# Patient Record
Sex: Female | Born: 1980 | Race: Asian | Hispanic: No | Marital: Married | Smoking: Never smoker
Health system: Southern US, Community
[De-identification: ages and names within clinical notes are randomized; demographics above are authoritative.]

## PROBLEM LIST (undated history)

## (undated) DIAGNOSIS — R7611 Nonspecific reaction to tuberculin skin test without active tuberculosis: Secondary | ICD-10-CM

## (undated) DIAGNOSIS — I10 Essential (primary) hypertension: Secondary | ICD-10-CM

## (undated) DIAGNOSIS — E119 Type 2 diabetes mellitus without complications: Secondary | ICD-10-CM

## (undated) DIAGNOSIS — I739 Peripheral vascular disease, unspecified: Secondary | ICD-10-CM

## (undated) DIAGNOSIS — C801 Malignant (primary) neoplasm, unspecified: Secondary | ICD-10-CM

## (undated) DIAGNOSIS — N809 Endometriosis, unspecified: Secondary | ICD-10-CM

## (undated) DIAGNOSIS — E785 Hyperlipidemia, unspecified: Secondary | ICD-10-CM

## (undated) DIAGNOSIS — R7302 Impaired glucose tolerance (oral): Secondary | ICD-10-CM

## (undated) DIAGNOSIS — L210 Seborrhea capitis: Secondary | ICD-10-CM

## (undated) HISTORY — DX: Endometriosis, unspecified: N80.9

## (undated) HISTORY — DX: Hyperlipidemia, unspecified: E78.5

## (undated) HISTORY — DX: Impaired glucose tolerance (oral): R73.02

---

## 2014-02-13 DIAGNOSIS — N809 Endometriosis, unspecified: Secondary | ICD-10-CM

## 2014-02-13 HISTORY — DX: Endometriosis, unspecified: N80.9

## 2014-06-22 ENCOUNTER — Ambulatory Visit (INDEPENDENT_AMBULATORY_CARE_PROVIDER_SITE_OTHER): Payer: 59 | Admitting: Family Medicine

## 2014-06-22 ENCOUNTER — Encounter: Payer: Self-pay | Admitting: Family Medicine

## 2014-06-22 VITALS — BP 117/82 | HR 72 | Temp 98.5°F | Resp 16 | Ht 62.0 in | Wt 135.2 lb

## 2014-06-22 DIAGNOSIS — L298 Other pruritus: Secondary | ICD-10-CM

## 2014-06-22 DIAGNOSIS — Z0000 Encounter for general adult medical examination without abnormal findings: Secondary | ICD-10-CM

## 2014-06-22 DIAGNOSIS — L210 Seborrhea capitis: Secondary | ICD-10-CM

## 2014-06-22 DIAGNOSIS — Z23 Encounter for immunization: Secondary | ICD-10-CM | POA: Diagnosis not present

## 2014-06-22 DIAGNOSIS — Z13220 Encounter for screening for lipoid disorders: Secondary | ICD-10-CM | POA: Diagnosis not present

## 2014-06-22 DIAGNOSIS — N979 Female infertility, unspecified: Secondary | ICD-10-CM

## 2014-06-22 DIAGNOSIS — N898 Other specified noninflammatory disorders of vagina: Secondary | ICD-10-CM

## 2014-06-22 DIAGNOSIS — L219 Seborrheic dermatitis, unspecified: Secondary | ICD-10-CM | POA: Diagnosis not present

## 2014-06-22 DIAGNOSIS — Z01419 Encounter for gynecological examination (general) (routine) without abnormal findings: Secondary | ICD-10-CM

## 2014-06-22 DIAGNOSIS — Z131 Encounter for screening for diabetes mellitus: Secondary | ICD-10-CM | POA: Diagnosis not present

## 2014-06-22 LAB — POCT URINALYSIS DIPSTICK
Bilirubin, UA: NEGATIVE
Blood, UA: NEGATIVE
GLUCOSE UA: NEGATIVE
Ketones, UA: NEGATIVE
LEUKOCYTES UA: NEGATIVE
NITRITE UA: NEGATIVE
Protein, UA: NEGATIVE
SPEC GRAV UA: 1.02
Urobilinogen, UA: 0.2
pH, UA: 5.5

## 2014-06-22 LAB — POCT URINE PREGNANCY: Preg Test, Ur: NEGATIVE

## 2014-06-22 LAB — COMPREHENSIVE METABOLIC PANEL
ALT: 40 U/L — ABNORMAL HIGH (ref 0–35)
AST: 24 U/L (ref 0–37)
Albumin: 4.4 g/dL (ref 3.5–5.2)
Alkaline Phosphatase: 95 U/L (ref 39–117)
BUN: 11 mg/dL (ref 6–23)
CO2: 26 mEq/L (ref 19–32)
CREATININE: 0.56 mg/dL (ref 0.50–1.10)
Calcium: 9.7 mg/dL (ref 8.4–10.5)
Chloride: 101 mEq/L (ref 96–112)
Glucose, Bld: 93 mg/dL (ref 70–99)
Potassium: 3.7 mEq/L (ref 3.5–5.3)
Sodium: 138 mEq/L (ref 135–145)
Total Bilirubin: 0.5 mg/dL (ref 0.2–1.2)
Total Protein: 8.3 g/dL (ref 6.0–8.3)

## 2014-06-22 LAB — LIPID PANEL
CHOL/HDL RATIO: 4.5 ratio
CHOLESTEROL: 213 mg/dL — AB (ref 0–200)
HDL: 47 mg/dL (ref >=46)
LDL Cholesterol: 147 mg/dL — ABNORMAL HIGH (ref 0–99)
Triglycerides: 97 mg/dL (ref <150)
VLDL: 19 mg/dL (ref 0–40)

## 2014-06-22 LAB — POCT WET PREP WITH KOH
KOH PREP POC: NEGATIVE
RBC WET PREP PER HPF POC: NEGATIVE
Trichomonas, UA: NEGATIVE
Yeast Wet Prep HPF POC: NEGATIVE

## 2014-06-22 MED ORDER — AMOXICILLIN 500 MG PO CAPS: 1000.0000 mg | ORAL_CAPSULE | Freq: Two times a day (BID) | ORAL | Status: DC

## 2014-06-22 NOTE — Progress Notes (Signed)
   Subjective:    Patient ID: Marie Hill, female    DOB: [birth year], 34 y.o.   MRN: [id number]  06/22/2014  Establish Care   HPI This 34 y.o. female presents to establish care and for the following:  Last physical:  Unsure. More than ten years. Pap smear:  never Mammogram:  never Colonoscopy:  never TDAP:  20 years ago. Influenza: never Eye exam:  2015; +glasses Dental exam:  [place]   Infertility:  Has been actively trying for 5-6 months.  Regular menses (7 days, heavy and then light, cramping).  Menses every 21 days.  Recently having more cramping.  Changed in past 3 months.  Withdrawal method prior to six months ago.  Two total partners in life.    L ear pain: onset 3 months ago.  Ear pain some; liquid coming from ear.  No pain when laying on L ear.    Skin issues:  Scalp irritation.  Using Dandruff shampoo and also using eczema cream.  +itching.  Fungal infection?     Review of Systems  History reviewed. No pertinent past medical history. History reviewed. No pertinent past surgical history. Not on File No current outpatient prescriptions on file.   No current facility-administered medications for this visit.       Objective:    BP 117/82 mmHg  Pulse 72  Temp(Src) 98.5 F (36.9 C) (Oral)  Resp 16  Ht 5\' 2"  (1.575 m)  Wt 135 lb 3.2 oz (61.326 kg)  BMI 24.72 kg/m2  SpO2 100%  LMP 06/03/2014 Physical Exam Results for orders placed or performed in visit on 06/22/14  POCT urinalysis dipstick  Result Value Ref Range   Color, UA yellow    Clarity, UA clear    Glucose, UA neg    Bilirubin, UA neg    Ketones, UA neg    Spec Grav, UA 1.020    Blood, UA neg    pH, UA 5.5    Protein, UA neg    Urobilinogen, UA 0.2    Nitrite, UA neg    Leukocytes, UA Negative   POCT urine pregnancy  Result Value Ref Range   Preg Test, Ur Negative   POCT Wet Prep with KOH  Result Value Ref Range   Trichomonas, UA Negative    Clue Cells Wet Prep HPF POC 0-1    Epithelial Wet  Prep HPF POC 2-4    Yeast Wet Prep HPF POC neg    Bacteria Wet Prep HPF POC 2+    RBC Wet Prep HPF POC neg    WBC Wet Prep HPF POC 2-4    KOH Prep POC Negative        Assessment & Plan:   1. Encounter for routine gynecological examination   2. Vaginal itching   3. Need for Tdap vaccination   4. Infertility, female   80. Screening for diabetes mellitus   6. Screening, lipid     No orders of the defined types were placed in this encounter.    No Follow-up on file.     George Haggart Elayne Guerin, M.D. [facility] [address] [place], [place]  [address] [phone] phone [phone] fax

## 2014-06-22 NOTE — Patient Instructions (Signed)
1.  RECOMMEND YOU STARTING PRENATAL VITAMIN; ONE TABLET AT BEDTIME.

## 2014-06-23 LAB — CBC WITH DIFFERENTIAL/PLATELET
Basophils Absolute: 0 10*3/uL (ref 0.0–0.1)
Basophils Relative: 0 % (ref 0–1)
Eosinophils Absolute: 0.2 10*3/uL (ref 0.0–0.7)
Eosinophils Relative: 2 % (ref 0–5)
HCT: 41.6 % (ref 36.0–46.0)
Hemoglobin: 13.4 g/dL (ref 12.0–15.0)
Lymphocytes Relative: 30 % (ref 12–46)
Lymphs Abs: 2.6 10*3/uL (ref 0.7–4.0)
MCH: 26.3 pg (ref 26.0–34.0)
MCHC: 32.2 g/dL (ref 30.0–36.0)
MCV: 81.7 fL (ref 78.0–100.0)
MONO ABS: 0.7 10*3/uL (ref 0.1–1.0)
MONOS PCT: 8 % (ref 3–12)
MPV: 10.7 fL (ref 8.6–12.4)
NEUTROS PCT: 60 % (ref 43–77)
Neutro Abs: 5.2 10*3/uL (ref 1.7–7.7)
Platelets: 324 10*3/uL (ref 150–400)
RBC: 5.09 MIL/uL (ref 3.87–5.11)
RDW: 14.2 % (ref 11.5–15.5)
WBC: 8.6 10*3/uL (ref 4.0–10.5)

## 2014-06-23 LAB — HEMOGLOBIN A1C
Hgb A1c MFr Bld: 6.1 % — ABNORMAL HIGH (ref <5.7)
Mean Plasma Glucose: 128 mg/dL — ABNORMAL HIGH (ref <117)

## 2014-06-23 LAB — TSH: TSH: 1.463 u[IU]/mL (ref 0.350–4.500)

## 2014-06-25 LAB — PAP IG, CT-NG NAA, HPV HIGH-RISK
CHLAMYDIA PROBE AMP: NEGATIVE
GC Probe Amp: NEGATIVE
HPV DNA HIGH RISK: NOT DETECTED

## 2014-07-02 ENCOUNTER — Encounter: Payer: Self-pay | Admitting: Family Medicine

## 2014-07-15 ENCOUNTER — Ambulatory Visit (INDEPENDENT_AMBULATORY_CARE_PROVIDER_SITE_OTHER): Payer: 59 | Admitting: Family Medicine

## 2014-07-15 ENCOUNTER — Encounter: Payer: Self-pay | Admitting: Family Medicine

## 2014-07-15 VITALS — BP 110/73 | HR 65 | Temp 98.7°F | Resp 16 | Ht 62.0 in | Wt 134.0 lb

## 2014-07-15 DIAGNOSIS — H66005 Acute suppurative otitis media without spontaneous rupture of ear drum, recurrent, left ear: Secondary | ICD-10-CM | POA: Diagnosis not present

## 2014-07-15 DIAGNOSIS — R945 Abnormal results of liver function studies: Secondary | ICD-10-CM

## 2014-07-15 DIAGNOSIS — R7302 Impaired glucose tolerance (oral): Secondary | ICD-10-CM

## 2014-07-15 DIAGNOSIS — E785 Hyperlipidemia, unspecified: Secondary | ICD-10-CM | POA: Diagnosis not present

## 2014-07-15 DIAGNOSIS — R7989 Other specified abnormal findings of blood chemistry: Secondary | ICD-10-CM

## 2014-07-15 MED ORDER — AMOXICILLIN-POT CLAVULANATE 875-125 MG PO TABS: 1.0000 | ORAL_TABLET | Freq: Two times a day (BID) | ORAL | Status: DC

## 2014-07-15 NOTE — Patient Instructions (Signed)

## 2014-07-15 NOTE — Progress Notes (Signed)
 Subjective:    Patient ID: Marie Hill, female    DOB: 10/29/1980, 33 y.o.   MRN: [id number]  07/15/2014  Follow-up; Otitis Media; Hyperlipidemia; and Hyperglycemia   HPI This 33 y.o. female presents for one month follow-up:  1. L otitis media: management at last visit included Amoxicillin therapy.  Ear pain is much improved but patient does not feel that it is 100%. Had been suffering with ear discomfort for 5-6 months prior to evaluation last month.  No rhinorrhea, nasal congestion, PND, cough. No sneezing or itchy eyes. No fever/chills/sweats. No sinus pressure.  Does not feel well on abx therapy but agreeable for further treatment if necessary.   2.  Hyperlipidemia:  Cholesterol elevated at recent visit; recommend weight loss, exercise, and low-cholesterol food choices.  Not exercising regularly.  Repeat labs recommended in six months. Lab Results  Component Value Date   CHOL 213* 06/22/2014   HDL 47 06/22/2014   LDLCALC 147* 06/22/2014   TRIG 97 06/22/2014   CHOLHDL 4.5 06/22/2014   3.  Glucose intolerance: diagnosed at visit one month ago. Denies regular sodas, sweet tea, or fruit juice.  Eats a lot of fruit in excess.  Repeat labs recommended in six months.  Lab Results  Component Value Date   HGBA1C 6.1* 06/22/2014   4. Elevated LFT: single elevated LFT at last visit; advised to repeat at visit in six months.    Review of Systems  Constitutional: Negative for fever, chills, diaphoresis and fatigue.  HENT: Positive for ear pain. Negative for congestion, ear discharge, facial swelling, hearing loss, mouth sores, nosebleeds, postnasal drip, rhinorrhea, sinus pressure, sneezing, sore throat, trouble swallowing and voice change.   Eyes: Negative for visual disturbance.  Respiratory: Negative for cough and shortness of breath.   Cardiovascular: Negative for chest pain, palpitations and leg swelling.  Gastrointestinal: Negative for nausea, vomiting, abdominal pain, diarrhea and  constipation.  Endocrine: Negative for cold intolerance, heat intolerance, polydipsia, polyphagia and polyuria.  Skin: Negative for rash.  Neurological: Negative for dizziness, tremors, seizures, syncope, facial asymmetry, speech difficulty, weakness, light-headedness, numbness and headaches.    No past medical history on file. No past surgical history on file. No Known Allergies History   Social History  . Marital Status: Single    Spouse Name: N/A  . Number of Children: N/A  . Years of Education: N/A   Occupational History  . Not on file.   Social History Main Topics  . Smoking status: Never Smoker   . Smokeless tobacco: Not on file  . Alcohol Use: 0.0 oz/week    0 Standard drinks or equivalent per week     Comment: rarely a beer  . Drug Use: No  . Sexual Activity: Yes   Other Topics Concern  . Not on file   Social History Narrative   Marital status: engaged; together with fiance x 6 years.      Children: none      Lives: with fiance; from [place]; [place] 14.      Employment:  Financial consultant and nail technician.      Tobacco: none      Alcohol: weekends      Drugs: none      Exercise:  sporadic   Family History  Problem Relation Age of Onset  . Hyperlipidemia Mother   . Hyperlipidemia Father         Objective:    BP 110/73 mmHg  Pulse 65  Temp(Src) 98.7   F (37.1 C)  Resp 16  Ht 5' 2" (1.575 m)  Wt 134 lb (60.782 kg)  BMI 24.50 kg/m2  SpO2 99%  LMP 05/13/2014 Physical Exam  Constitutional: She is oriented to person, place, and time. She appears well-developed and well-nourished. No distress.  HENT:  Head: Normocephalic and atraumatic.  Right Ear: Tympanic membrane and external ear normal.  Left Ear: External ear normal. No drainage or swelling. No mastoid tenderness. A middle ear effusion is present.  Ears:  Nose: Nose normal.  Mouth/Throat: Oropharynx is clear and moist.  Scaling B external ear canals.  Mild erythema  diffusely without swelling.  Eyes: Conjunctivae and EOM are normal. Pupils are equal, round, and reactive to light.  Neck: Normal range of motion. Neck supple. Carotid bruit is not present. No thyromegaly present.  Cardiovascular: Normal rate, regular rhythm, normal heart sounds and intact distal pulses.  Exam reveals no gallop and no friction rub.   No murmur heard. Pulmonary/Chest: Effort normal and breath sounds normal. She has no wheezes. She has no rales.  Abdominal: Soft. Bowel sounds are normal. She exhibits no distension and no mass. There is no tenderness. There is no rebound and no guarding.  Lymphadenopathy:    She has no cervical adenopathy.  Neurological: She is alert and oriented to person, place, and time. No cranial nerve deficit.  Skin: Skin is warm and dry. No rash noted. She is not diaphoretic. No erythema. No pallor.  Psychiatric: She has a normal mood and affect. Her behavior is normal.   Results for orders placed or performed in visit on 06/22/14  CBC with Differential/Platelet  Result Value Ref Range   WBC 8.6 4.0 - 10.5 K/uL   RBC 5.09 3.87 - 5.11 MIL/uL   Hemoglobin 13.4 12.0 - 15.0 g/dL   HCT 41.6 36.0 - 46.0 %   MCV 81.7 78.0 - 100.0 fL   MCH 26.3 26.0 - 34.0 pg   MCHC 32.2 30.0 - 36.0 g/dL   RDW 14.2 11.5 - 15.5 %   Platelets 324 150 - 400 K/uL   MPV 10.7 8.6 - 12.4 fL   Neutrophils Relative % 60 43 - 77 %   Neutro Abs 5.2 1.7 - 7.7 K/uL   Lymphocytes Relative 30 12 - 46 %   Lymphs Abs 2.6 0.7 - 4.0 K/uL   Monocytes Relative 8 3 - 12 %   Monocytes Absolute 0.7 0.1 - 1.0 K/uL   Eosinophils Relative 2 0 - 5 %   Eosinophils Absolute 0.2 0.0 - 0.7 K/uL   Basophils Relative 0 0 - 1 %   Basophils Absolute 0.0 0.0 - 0.1 K/uL   Smear Review Criteria for review not met   Hemoglobin A1c  Result Value Ref Range   Hgb A1c MFr Bld 6.1 (H) <5.7 %   Mean Plasma Glucose 128 (H) <117 mg/dL  Comprehensive metabolic panel  Result Value Ref Range   Sodium 138 135 -  145 mEq/L   Potassium 3.7 3.5 - 5.3 mEq/L   Chloride 101 96 - 112 mEq/L   CO2 26 19 - 32 mEq/L   Glucose, Bld 93 70 - 99 mg/dL   BUN 11 6 - 23 mg/dL   Creat 0.56 0.50 - 1.10 mg/dL   Total Bilirubin 0.5 0.2 - 1.2 mg/dL   Alkaline Phosphatase 95 39 - 117 U/L   AST 24 0 - 37 U/L   ALT 40 (H) 0 - 35 U/L   Total Protein 8.3 6.0 -  8.3 g/dL   Albumin 4.4 3.5 - 5.2 g/dL   Calcium 9.7 8.4 - 10.5 mg/dL  Lipid panel  Result Value Ref Range   Cholesterol 213 (H) 0 - 200 mg/dL   Triglycerides 97 <150 mg/dL   HDL 47 >=46 mg/dL   Total CHOL/HDL Ratio 4.5 Ratio   VLDL 19 0 - 40 mg/dL   LDL Cholesterol 147 (H) 0 - 99 mg/dL  TSH  Result Value Ref Range   TSH 1.463 0.350 - 4.500 uIU/mL  POCT urinalysis dipstick  Result Value Ref Range   Color, UA yellow    Clarity, UA clear    Glucose, UA neg    Bilirubin, UA neg    Ketones, UA neg    Spec Grav, UA 1.020    Blood, UA neg    pH, UA 5.5    Protein, UA neg    Urobilinogen, UA 0.2    Nitrite, UA neg    Leukocytes, UA Negative   POCT urine pregnancy  Result Value Ref Range   Preg Test, Ur Negative   POCT Wet Prep with KOH  Result Value Ref Range   Trichomonas, UA Negative    Clue Cells Wet Prep HPF POC 0-1    Epithelial Wet Prep HPF POC 2-4    Yeast Wet Prep HPF POC neg    Bacteria Wet Prep HPF POC 2+    RBC Wet Prep HPF POC neg    WBC Wet Prep HPF POC 2-4    KOH Prep POC Negative   Pap IG, CT/NG NAA, and HPV (high risk)  Result Value Ref Range   HPV DNA High Risk Not Detected    Specimen adequacy: SEE NOTE    FINAL DIAGNOSIS: SEE NOTE    Cytotechnologist: SEE NOTE    Chlamydia Probe Amp NEGATIVE    GC Probe Amp NEGATIVE        Assessment & Plan:   1. Recurrent acute suppurative otitis media without spontaneous rupture of left tympanic membrane   2. Hyperlipidemia   3. Glucose intolerance (impaired glucose tolerance)   4. Elevated liver function tests    1. Persistent suppurative otitis media L: improved with  persistence; rx for Augmentin provided; RTC 3 weeks to confirm resolution. 2.  Hyperlipidemia: New. Recommend weight loss, exercise, low-cholesterol food choices.  Repeat FLP in six months. 3.  Glucose intolerance: New. Recommend weight loss, exercise, low-sugar food choices.  Repeat labs in six months. 4.  Elevated LFTs: New.  Repeat at next visit; avoid alcohol and acetaminophen products.   Meds ordered this encounter  Medications  . amoxicillin-clavulanate (AUGMENTIN) 875-125 MG per tablet    Sig: Take 1 tablet by mouth 2 (two) times daily.    Dispense:  30 tablet    Refill:  0    Return in about 4 weeks (around 08/12/2014) for recheck.     Terrell Ostrand Elayne Guerin, M.D. [facility] [address] [place], [place]  [address] [phone] phone [phone] fax

## 2014-08-05 ENCOUNTER — Encounter: Payer: Self-pay | Admitting: Family Medicine

## 2014-08-05 ENCOUNTER — Ambulatory Visit (INDEPENDENT_AMBULATORY_CARE_PROVIDER_SITE_OTHER): Payer: 59 | Admitting: Family Medicine

## 2014-08-05 VITALS — BP 110/70 | HR 83 | Temp 98.2°F | Resp 16 | Ht 61.5 in | Wt 133.2 lb

## 2014-08-05 DIAGNOSIS — L298 Other pruritus: Secondary | ICD-10-CM | POA: Diagnosis not present

## 2014-08-05 DIAGNOSIS — N898 Other specified noninflammatory disorders of vagina: Secondary | ICD-10-CM

## 2014-08-05 DIAGNOSIS — H663X2 Other chronic suppurative otitis media, left ear: Secondary | ICD-10-CM

## 2014-08-05 DIAGNOSIS — R7611 Nonspecific reaction to tuberculin skin test without active tuberculosis: Secondary | ICD-10-CM

## 2014-08-05 LAB — POCT WET PREP WITH KOH
KOH Prep POC: POSITIVE
Trichomonas, UA: NEGATIVE
Yeast Wet Prep HPF POC: NEGATIVE

## 2014-08-05 MED ORDER — TERCONAZOLE 0.4 % VA CREA: 1.0000 | TOPICAL_CREAM | Freq: Every day | VAGINAL | Status: DC

## 2014-08-05 MED ORDER — FLUCONAZOLE 150 MG PO TABS: 150.0000 mg | ORAL_TABLET | Freq: Once | ORAL | Status: DC

## 2014-08-05 NOTE — Progress Notes (Signed)
Subjective:    Patient ID: Marie Hill, female    DOB: Aug 06, 1980, 34 y.o.   MRN: [id number]  08/05/2014  Follow-up   HPI This 34 y.o. female presents for follow-up:  1.  L otitis media:  Has one remaining Augmentin to take; has some drainage still from ear.    2.  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  Goes to hospital and visits friend who was diagnosed with active tuberculosis.  Testing 36mm; previous Tb test in college.  Prescribed medication for +tb test; recalls only taking one month of medication.  Here in [employer].  S/p CXR on 07/03/14; s/p [facility].  Has not been contacted about CXR results; called yesterday for results.  No blood drawn.    3. Vaginal itching:  +mild discharge; onset with abx.  Review of Systems  Constitutional: Negative for fever, chills, diaphoresis and fatigue.  Eyes: Negative for visual disturbance.  Respiratory: Negative for cough and shortness of breath.   Cardiovascular: Negative for chest pain, palpitations and leg swelling.  Gastrointestinal: Negative for nausea, vomiting, abdominal pain, diarrhea and constipation.  Endocrine: Negative for cold intolerance, heat intolerance, polydipsia, polyphagia and polyuria.  Neurological: Negative for dizziness, tremors, seizures, syncope, facial asymmetry, speech difficulty, weakness, light-headedness, numbness and headaches.    History reviewed. No pertinent past medical history. History reviewed. No pertinent past surgical history. No Known Allergies Current Outpatient Prescriptions  Medication Sig Dispense Refill  . amoxicillin-clavulanate (AUGMENTIN) 875-125 MG per tablet Take 1 tablet by mouth 2 (two) times daily. (Patient not taking: Reported on 08/12/2014) 30 tablet 0  . fluconazole (DIFLUCAN) 150 MG tablet Take 1 tablet (150 mg total) by mouth once. Repeat if needed (Patient not taking: Reported on 08/12/2014) 2 tablet 0  . terconazole (TERAZOL 7) 0.4 % vaginal cream Place  1 applicator vaginally at bedtime. (Patient not taking: Reported on 08/12/2014) 45 g 0   No current facility-administered medications for this visit.       Objective:    BP 110/70 mmHg  Pulse 83  Temp(Src) 98.2 F (36.8 C) (Oral)  Resp 16  Ht 5' 1.5" (1.562 m)  Wt 133 lb 3.2 oz (60.419 kg)  BMI 24.76 kg/m2  SpO2 99%  LMP 07/15/2014 (Approximate) Physical Exam  Constitutional: She is oriented to person, place, and time. She appears well-developed and well-nourished. No distress.  HENT:  Head: Normocephalic and atraumatic.  Right Ear: External ear normal.  Left Ear: External ear normal.  Nose: Nose normal.  Mouth/Throat: Oropharynx is clear and moist.  Eyes: Conjunctivae and EOM are normal. Pupils are equal, round, and reactive to light.  Neck: Normal range of motion. Neck supple. Carotid bruit is not present. No thyromegaly present.  Cardiovascular: Normal rate, regular rhythm, normal heart sounds and intact distal pulses.  Exam reveals no gallop and no friction rub.   No murmur heard. Pulmonary/Chest: Effort normal and breath sounds normal. She has no wheezes. She has no rales.  Abdominal: Soft. Bowel sounds are normal. She exhibits no distension and no mass. There is no tenderness. There is no rebound and no guarding.  Lymphadenopathy:    She has no cervical adenopathy.  Neurological: She is alert and oriented to person, place, and time. No cranial nerve deficit.  Skin: Skin is warm and dry. No rash noted. She is not diaphoretic. No erythema. No pallor.  Psychiatric: She has a normal mood and affect. Her behavior is normal.   Results for orders  placed or performed in visit on 08/05/14  POCT Wet Prep with KOH  Result Value Ref Range   Trichomonas, UA Negative    Clue Cells Wet Prep HPF POC tntc    Epithelial Wet Prep HPF POC Many Few, Moderate, Many   Yeast Wet Prep HPF POC neg    Bacteria Wet Prep HPF POC Moderate (A) Few   RBC Wet Prep HPF POC 3-5    WBC Wet Prep HPF  POC tntc    KOH Prep POC Positive        Assessment & Plan:   1. 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 fluconazole (DIFLUCAN) 150 MG tablet    Sig: Take 1 tablet (150 mg total) by mouth once. Repeat if needed    Dispense:  2 tablet    Refill:  0  . terconazole (TERAZOL 7) 0.4 % vaginal cream    Sig: Place 1 applicator vaginally at bedtime.    Dispense:  45 g    Refill:  0    No Follow-up on file.    Rhonda Linan Elayne Guerin, M.D. [facility] [address] [place], [place]  [address] [phone] phone [phone] fax

## 2014-08-05 NOTE — Patient Instructions (Signed)

## 2014-08-12 ENCOUNTER — Ambulatory Visit (INDEPENDENT_AMBULATORY_CARE_PROVIDER_SITE_OTHER): Payer: 59 | Admitting: Gynecology

## 2014-08-12 ENCOUNTER — Encounter: Payer: Self-pay | Admitting: Gynecology

## 2014-08-12 VITALS — BP 118/76 | Ht 61.0 in | Wt 132.0 lb

## 2014-08-12 DIAGNOSIS — R1900 Intra-abdominal and pelvic swelling, mass and lump, unspecified site: Secondary | ICD-10-CM

## 2014-08-12 DIAGNOSIS — N979 Female infertility, unspecified: Secondary | ICD-10-CM

## 2014-08-12 MED ORDER — DOXYCYCLINE HYCLATE 100 MG PO CAPS: 100.0000 mg | ORAL_CAPSULE | Freq: Two times a day (BID) | ORAL | Status: DC

## 2014-08-12 NOTE — Progress Notes (Signed)
   Patient is a 34 year old gravida 0 who presented to the office today with concerns of primary infertility. She states that she has been with her same partner for 6 years but for the past 4 years she has been using only withdrawal for contraception area for the past year she's been trying to get pregnant has been unsuccessful. She reports her cycles being every 28 days. She denies any past history of any STDs or any pelvic surgery. Her partner has had a child with a previous relationship. 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 Goes to hospital and visits friend who was diagnosed with active tuberculosis. Testing 59mm; previous Tb test in college. Prescribed medication for +tb test; recalls only taking one month of medication. Here in [employer]. S/p CXR on 07/03/14; s/p [facility]. Has not been contacted about CXR results; called yesterday for results. No blood drawn."  Patient had a normal Pap smear this year. Patient with no past history of abnormal Pap smear.  Exam: Blood pressure 118/76   weight 132 pounds   5 feet 1 inches tall   BMI 24.94 Gen. appearance well-developed 1 nursery no no acute distress Abdomen: Soft nontender no rebound or guarding Pelvic: Bartholin urethra Skene was within normal limits Vagina: No lesion discharge Cervix: No gross lesions on inspection Uterus retroverted irregular shaped enlarged nontender Adnexa difficult to assess due to size of uterus Rectal exam: Not done  Assessment/plan: #1 primary infertility enlarged uterus we'll schedule ultrasound  ultrasound before proceeding with HSG and semen analysis. #2 patient started to begin taking prenatal vitamins for neural tube defect prophylaxis #3 patient to contact the [facility] for follow-up on her TB testing or with her PCP.

## 2014-08-12 NOTE — Patient Instructions (Addendum)
Infertility WHAT IS INFERTILITY?  Infertility is usually defined as not being able to get pregnant after trying for one year of regular sexual intercourse without the use of contraceptives. Or not being able to carry a pregnancy to term and have a baby. The infertility rate in the [place] is around 10%. Pregnancy is the result of a chain of events. A woman must release an egg from one of her ovaries (ovulation). The egg must be fertilized by the female sperm. Then it travels through a fallopian tube into the uterus (womb), where it attaches to the wall of the uterus and grows. A man must have enough sperm, and the sperm must join with (fertilize) the egg along the way, at the proper time. The fertilized egg must then become attached to the inside of the uterus. While this may seem simple, many things can happen to prevent pregnancy from occurring.  WHOSE PROBLEM IS IT?  About 20% of infertility cases are due to problems with the man (female factors) and 65% are due to problems with the woman (female factors). Other cases are due to a combination of female and female factors or to unknown causes.  WHAT CAUSES INFERTILITY IN MEN?  Infertility in men is often caused by problems with making enough normal sperm or getting the sperm to reach the egg. Problems with sperm may exist from birth or develop later in life, due to illness or injury. Some men produce no sperm, or produce too few sperm (oligospermia). Other problems include:  Sexual dysfunction.  Hormonal or endocrine problems.  Age. Female fertility decreases with age, but not at as young an age as female fertility.  Infection.  Congenital problems. Birth defect, such as absence of the tubes that carry the sperm (vas deferens).  Genetic/chromosomal problems.  Antisperm antibody problems.  Retrograde ejaculation (sperm go into the bladder).  Varicoceles, spematoceles, or tumors of the testicles.  Lifestyle can influence the number and  quality of a man's sperm.  Alcohol and drugs can temporarily reduce sperm quality.  Environmental toxins, including pesticides and lead, may cause some cases of infertility in men. WHAT CAUSES INFERTILITY IN WOMEN?   Problems with ovulation account for most infertility in women. Without ovulation, eggs are not available to be fertilized.  Signs of problems with ovulation include irregular menstrual periods or no periods at all.  Simple lifestyle factors, including stress, diet, or athletic training, can affect a woman's hormonal balance.  Age. Fertility begins to decrease in women in the early 76s and is worse after age 71.  Much less often, a hormonal imbalance from a serious medical problem, such as a pituitary gland tumor, thyroid or other chronic medical disease, can cause ovulation problems.  Pelvic infections.  Polycystic ovary syndrome (increase in female hormones, unable to ovulate).  Alcohol or illegal drugs.  Environmental toxins, radiation, pesticides, and certain chemicals.  Aging is an important factor in female infertility.  The ability of a woman's ovaries to produce eggs declines with age, especially after age 27. About one third of couples where the woman is over 21 will have problems with fertility.  By the time she reaches menopause when her monthly periods stop for good, a woman can no longer produce eggs or become pregnant.  Other problems can also lead to infertility in women. If the fallopian tubes are blocked at one or both ends, the egg cannot travel through the tubes into the uterus. Scar tissue (adhesions) in the pelvis may cause blocked  tubes. This may result from pelvic inflammatory disease, endometriosis, or surgery for an ectopic pregnancy (fertilized egg implanted outside the uterus) or any pelvic or abdominal surgery causing adhesions.  Fibroid tumors or polyps of the uterus.  Congenital (birth defect) abnormalities of the uterus.  Infection of the  cervix (cervicitis).  Cervical stenosis (narrowing).  Abnormal cervical mucus.  Polycystic ovary syndrome.  Having sexual intercourse too often (every other day or 4 to 5 times a week).  Obesity.  Anorexia.  Poor nutrition.  Over exercising, with loss of body fat.  DES. Your mother received diethylstilbesterol hormone when pregnant with you. HOW IS INFERTILITY TESTED?  If you have been trying to have a baby without success, you may want to seek medical help. You should not wait for one year of trying before seeing a health care provider if:  You are over 35.  You have reason to believe that there may be a fertility problem. A medical evaluation may determine the reasons for a couple's infertility. Usually this process begins with:  Physical exams.  Medical histories of both partners.  Sexual histories of both partners. If there is no obvious problem, like improperly timed intercourse or absence of ovulation, tests may be needed.   For a man, testing usually begins with tests of his semen to look at:  The number of sperm.  The shape of sperm.  Movement of his sperm.  Taking a complete medical and surgical history.  Physical examination.  Check for infection of the female reproductive organs. Sometimes hormone tests are done.   For a woman, the first step in testing is to find out if she is ovulating each month. There are several ways to do this. For example, she can keep track of changes in her morning body temperature and in the texture of her cervical mucus. Another tool is a home ovulation test kit, which can be bought at drug or grocery stores.  Checks of ovulation can also be done in the doctor's office, using blood tests for hormone levels or ultrasound tests of the ovaries. If the woman is ovulating, more tests will need to be done. Some common female tests include:  Hysterosalpingogram: An x-ray of the fallopian tubes and uterus after they are injected with  dye. It shows if the tubes are open and shows the shape of the uterus.  Laparoscopy: An exam of the tubes and other female organs for disease. A lighted tube called a laparoscope is used to see inside the abdomen.  Endometrial biopsy: Sample of uterus tissue taken on the first day of the menstrual period, to see if the tissue indicates you are ovulating.  Transvaginal ultrasound: Examines the female organs.  Hysteroscopy: Uses a lighted tube to examine the cervix and inside the uterus, to see if there are any abnormalities inside the uterus. TREATMENT  Depending on the test results, different treatments can be suggested. The type of treatment depends on the cause. 85 to 90% of infertility cases are treated with drugs or surgery.   Various fertility drugs may be used for women with ovulation problems. It is important to talk with your caregiver about the drug to be used. You should understand the drug's benefits and side effects. Depending on the type of fertility drug and the dosage of the drug used, multiple births (twins or multiples) can occur in some women.  If needed, surgery can be done to repair damage to a woman's ovaries, fallopian tubes, cervix, or uterus.  Surgery  or medical treatment for endometriosis or polycystic ovary syndrome. Sometimes a man has an infertility problem that can be corrected with medicine or by surgery.  Intrauterine insemination (IUI) of sperm, timed with ovulation.  Change in lifestyle, if that is the cause (lose weight, increase exercise, and stop smoking, drinking excessively, or taking illegal drugs).  Other types of surgery:  Removing growths inside and on the uterus.  Removing scar tissue from inside of the uterus.  Fixing blocked tubes.  Removing scar tissue in the pelvis and around the female organs. WHAT IS ASSISTED REPRODUCTIVE TECHNOLOGY (ART)?  Assisted reproductive technology (ART) is another form of special methods used to help infertile  couples. ART involves handling both the woman's eggs and the man's sperm. Success rates vary and depend on many factors. ART can be expensive and time-consuming. But ART has made it possible for many couples to have children that otherwise would not have been conceived. Some methods are listed below:  In vitro fertilization (IVF). IVF is often used when a woman's fallopian tubes are blocked or when a man has low sperm counts. A drug is used to stimulate the ovaries to produce multiple eggs. Once mature, the eggs are removed and placed in a culture dish with the man's sperm for fertilization. After about 40 hours, the eggs are examined to see if they have become fertilized by the sperm and are dividing into cells. These fertilized eggs (embryos) are then placed in the woman's uterus. This bypasses the fallopian tubes.  Gamete intrafallopian transfer (GIFT) is similar to IVF, but used when the woman has at least one normal fallopian tube. Three to five eggs are placed in the fallopian tube, along with the man's sperm, for fertilization inside the woman's body.  Zygote intrafallopian transfer (ZIFT), also called tubal embryo transfer, combines IVF and GIFT. The eggs retrieved from the woman's ovaries are fertilized in the lab and placed in the fallopian tubes rather than in the uterus.  ART procedures sometimes involve the use of donor eggs (eggs from another woman) or previously frozen embryos. Donor eggs may be used if a woman has impaired ovaries or has a genetic disease that could be passed on to her baby.  When performing ART, you are at higher risk for resulting in multiple pregnancies, twins, triplets or more.  Intracytoplasma sperm injection is a procedure that injects a single sperm into the egg to fertilize it.  Embryo transplant is a procedure that starts after growing an embryo in a special media (chemical solution) developed to keep the embryo alive for 2 to 5 days, and then transplanting it  into the uterus. In cases where a cause cannot be found and pregnancy does not occur, adoption may be a consideration. Document Released: 02/02/2003 Document Revised: 04/24/2011 Document Reviewed: 12/29/2008 [facility] Patient Information 2015 [place], [place]. This information is not intended to replace advice given to you by your health care provider. Make sure you discuss any questions you have with your health care provider. Uterine Fibroid A uterine fibroid is a growth (tumor) that occurs in your uterus. This type of tumor is not cancerous and does not spread out of the uterus. You can have one or many fibroids. Fibroids can vary in size, weight, and where they grow in the uterus. Some can become quite large. Most fibroids do not require medical treatment, but some can cause pain or heavy bleeding during and between periods. CAUSES  A fibroid is the result of a single uterine cell  that keeps growing (unregulated), which is different than most cells in the human body. Most cells have a control mechanism that keeps them from reproducing without control.  SIGNS AND SYMPTOMS   Bleeding.  Pelvic pain and pressure.  Bladder problems due to the size of the fibroid.  Infertility and miscarriages depending on the size and location of the fibroid. DIAGNOSIS  Uterine fibroids are diagnosed through a physical exam. Your health care provider may feel the lumpy tumors during a pelvic exam. Ultrasonography may be done to get information regarding size, location, and number of tumors.  TREATMENT   Your health care provider may recommend watchful waiting. This involves getting the fibroid checked by your health care provider to see if it grows or shrinks.   Hormone treatment or an intrauterine device (IUD) may be prescribed.   Surgery may be needed to remove the fibroids (myomectomy) or the uterus (hysterectomy). This depends on your situation. When fibroids interfere with fertility and a woman wants to  become pregnant, a health care provider may recommend having the fibroids removed.  [place] care depends on how you were treated. In general:   Keep all follow-up appointments with your health care provider.   Only take over-the-counter or prescription medicines as directed by your health care provider. If you were prescribed a hormone treatment, take the hormone medicines exactly as directed. Do not take aspirin. It can cause bleeding.   Talk to your health care provider about taking iron pills.  If your periods are troublesome but not so heavy, lie down with your feet raised slightly above your heart. Place cold packs on your lower abdomen.   If your periods are heavy, write down the number of pads or tampons you use per month. Bring this information to your health care provider.   Include green vegetables in your diet.  SEEK IMMEDIATE MEDICAL CARE IF:  You have pelvic pain or cramps not controlled with medicines.   You have a sudden increase in pelvic pain.   You have an increase in bleeding between and during periods.   You have excessive periods and soak tampons or pads in a half hour or less.  You feel lightheaded or have fainting episodes. Document Released: 01/28/2000 Document Revised: 11/20/2012 Document Reviewed: 08/29/2012 [facility] Patient Information 2015 [place], [place]. This information is not intended to replace advice given to you by your health care provider. Make sure you discuss any questions you have with your health care provider.

## 2014-09-02 ENCOUNTER — Encounter: Payer: Self-pay | Admitting: Gynecology

## 2014-09-02 ENCOUNTER — Other Ambulatory Visit: Payer: Self-pay | Admitting: Gynecology

## 2014-09-02 ENCOUNTER — Ambulatory Visit (INDEPENDENT_AMBULATORY_CARE_PROVIDER_SITE_OTHER): Payer: 59

## 2014-09-02 ENCOUNTER — Ambulatory Visit (INDEPENDENT_AMBULATORY_CARE_PROVIDER_SITE_OTHER): Payer: 59 | Admitting: Gynecology

## 2014-09-02 VITALS — BP 120/80

## 2014-09-02 DIAGNOSIS — N979 Female infertility, unspecified: Secondary | ICD-10-CM | POA: Diagnosis not present

## 2014-09-02 DIAGNOSIS — N831 Corpus luteum cyst of ovary, unspecified side: Secondary | ICD-10-CM

## 2014-09-02 DIAGNOSIS — N949 Unspecified condition associated with female genital organs and menstrual cycle: Secondary | ICD-10-CM | POA: Diagnosis not present

## 2014-09-02 DIAGNOSIS — N9489 Other specified conditions associated with female genital organs and menstrual cycle: Secondary | ICD-10-CM

## 2014-09-02 DIAGNOSIS — R1900 Intra-abdominal and pelvic swelling, mass and lump, unspecified site: Secondary | ICD-10-CM

## 2014-09-02 NOTE — Patient Instructions (Signed)
CA-125 Tumor Marker CA 125 is a tumor marker that is used to help monitor the course of ovarian or endometrial cancer. PREPARATION FOR TEST No preparation is necessary. NORMAL FINDINGS Adults: 0-35 units/mL (0-35 kilounits)/L Ranges for normal findings may vary among different laboratories and hospitals. You should always check with your doctor after having lab work or other tests done to discuss the meaning of your test results and whether your values are considered within normal limits. MEANING OF TEST  Your caregiver will go over the test results with you and discuss the importance and meaning of your results, as well as treatment options and the need for additional tests if necessary. OBTAINING THE TEST RESULTS It is your responsibility to obtain your test results. Ask the lab or department performing the test when and how you will get your results. Document Released: 02/22/2004 Document Revised: 04/24/2011 Document Reviewed: 01/08/2008 [facility] Patient Information 2015 [place], [place]. This information is not intended to replace advice given to you by your health care provider. Make sure you discuss any questions you have with your health care provider. Endometriosis Endometriosis is a condition in which the tissue that lines the uterus (endometrium) grows outside of its normal location. The tissue may grow in many locations close to the uterus, but it commonly grows on the ovaries, fallopian tubes, vagina, or bowel. Because the uterus expels, or sheds, its lining every menstrual cycle, there is bleeding wherever the endometrial tissue is located. This can cause pain because blood is irritating to tissues not normally exposed to it.  CAUSES  The cause of endometriosis is not known.  SIGNS AND SYMPTOMS  Often, there are no symptoms. When symptoms are present, they can vary with the location of the displaced tissue. Various symptoms can occur at different times. Although symptoms occur mainly  during a woman's menstrual period, they can also occur midcycle and usually stop with menopause. Some people may go months with no symptoms at all. Symptoms may include:   Back or abdominal pain.   Heavier bleeding during periods.   Pain during intercourse.   Painful bowel movements.   Infertility. DIAGNOSIS  Your health care provider will do a physical exam and ask about your symptoms. Various tests may be done, such as:   Blood tests and urine tests. These are done to help rule out other problems.   Ultrasound. This test is done to look for abnormal tissue.   An X-ray of the lower bowel (barium enema).  Laparoscopy. In this procedure, a thin, lighted tube with a tiny camera on the end (laparoscope) is inserted into your abdomen. This helps your health care provider look for abnormal tissue to confirm the diagnosis. The health care provider may also remove a small piece of tissue (biopsy) from any abnormal tissue found. This tissue sample can then be sent to a lab so it can be looked at under a microscope. TREATMENT  Treatment will vary and may include:   Medicines to relieve pain. Nonsteroidal anti-inflammatory drugs (NSAIDs) are a type of pain medicine that can help to relieve the pain caused by endometriosis.  Hormonal therapy. When using hormonal therapy, periods are eliminated. This eliminates the monthly exposure to blood by the displaced endometrial tissue.   Surgery. Surgery may sometimes be done to remove the abnormal endometrial tissue. In severe cases, surgery may be done to remove the fallopian tubes, uterus, and ovaries (hysterectomy). HOME CARE INSTRUCTIONS   Take all medicines as directed by your health care provider.  Do not take aspirin because it may increase bleeding when you are not on hormonal therapy.   Avoid activities that produce pain, including sexual activity. SEEK MEDICAL CARE IF:  You have pelvic pain before, after, or during your  periods.  You have pelvic pain between periods that gets worse during your period.  You have pelvic pain during or after sex.  You have pelvic pain with bowel movements or urination, especially during your period.  You have problems getting pregnant.  You have a fever. SEEK IMMEDIATE MEDICAL CARE IF:   Your pain is severe and is not responding to pain medicine.   You have severe nausea and vomiting, or you cannot keep foods down.   You have pain that is limited to the right lower part of your abdomen.   You have swelling or increasing pain in your abdomen.   You see blood in your stool.  MAKE SURE YOU:   Understand these instructions.  Will watch your condition.  Will get help right away if you are not doing well or get worse. Document Released: 01/28/2000 Document Revised: 06/16/2013 Document Reviewed: 09/27/2012 [facility] Patient Information 2015 [place], [place]. This information is not intended to replace advice given to you by your health care provider. Make sure you discuss any questions you have with your health care provider.

## 2014-09-02 NOTE — Progress Notes (Signed)
   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 She reports her cycles being every 28 days. She denies any past history of any STDs or any pelvic surgery. Her partner has had a child with a previous relationship. 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 Goes to hospital and visits friend who was diagnosed with active tuberculosis. Testing 15mm; previous Tb test in college. Prescribed medication for +tb test; recalls only taking one month of medication. Here in [employer]. S/p CXR on 07/03/14; s/p [facility]. Has not been contacted about CXR results; called yesterday for results. No blood drawn."  Patient had a normal Pap smear this year. Patient with no past history of abnormal Pap smear.  Pelvic exam was found to be abnormal whereby there was a large fullness noted in the left adnexa but no tenderness elicited. Before proceeding with the HSG because of the findings on pelvic exam I wanted to do an ultrasound here in the office and this is the reason why patient is here today. Ultrasound today demonstrated the following:  Uterus measures 6.8 x 5.5 x 4.8 cm endometrial stripe 10.9 mm (last menstrual period 08/18/2014). The right ovary will there was a corpus luteum cyst was measured 14 x 16 x 14 mm with noted color flow in the periphery. Left ovary not seen but a large adnexal mass measuring 12.1 x 9.4 x 8.4 cm defect using internal low level echoes homogeneous echoes along with a solid calcification in the mass which measured 3.6 x 1.3 x 2.0 cm with arterial  blood flow seen within the wall of the mass. There was no fluid in the cul-de-sac.  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 Patient will call during the time of her cycle since she is currently on day 14 of her cycle and has not been using any form of contraception. From now until the time of the CT scan I have asked her to use condoms for contraception. A ROMA-1 ovarian cancer screening test was ordered today. We will wait for the results and manage accordingly. We will plan some time in the month of August to proceed with laparotomy and ovarian cystectomy possible oophorectomy. We will discuss further once we have the CT scan report and the above-mentioned lab result completed. Literature information was provided.

## 2014-09-03 ENCOUNTER — Telehealth: Payer: Self-pay | Admitting: *Deleted

## 2014-09-03 DIAGNOSIS — N9489 Other specified conditions associated with female genital organs and menstrual cycle: Secondary | ICD-10-CM

## 2014-09-03 NOTE — Telephone Encounter (Signed)
-----   Message from Terrance Mass, MD sent at 09/02/2014  5:13 PM EDT ----- Please schedule abdominal pelvic CT scan with and without contrast for this patient with a 12 cm adnexal mass 2 weeks from today since she is currently mid cycle and not using any form of contraception. I would then need to see her in consultation the week after the CT scan

## 2014-09-03 NOTE — Telephone Encounter (Signed)
CT scan at [facility] on 09/21/14 @ 12:30pm Npo 4 hours prior to CT scan, will need to pickup contrast day before at [facility] radiology the will give pt directions on when to drink contrast. Pt informed with the below note.

## 2014-09-04 ENCOUNTER — Telehealth: Payer: Self-pay

## 2014-09-04 NOTE — Telephone Encounter (Signed)
I spoke with Luellen Pucker at [facility] [phone] and received prior authorization for [id number]. Auth #A [id number].  Info added to appt note.

## 2014-09-10 ENCOUNTER — Telehealth: Payer: Self-pay | Admitting: *Deleted

## 2014-09-10 LAB — OVARIAN MALIGNANCY RISK-ROMA
CA125: 164 U/mL — ABNORMAL HIGH (ref <35)
HE4: 27 pM (ref <151)
ROMA Postmenopausal: 2.83 — ABNORMAL HIGH (ref <2.77)
ROMA Premenopausal: 0.21 (ref <1.31)

## 2014-09-10 NOTE — Telephone Encounter (Signed)
Appointment on 09/25/14 @ 12:15pm with Dr.Clark-Pearson at cancer center, pt aware.

## 2014-09-10 NOTE — Telephone Encounter (Signed)
-----   Message from Ramond Craver, [place] sent at 09/10/2014  2:32 PM EDT ----- Regarding: referral to gyn onc Please inform patient that her CA-125 was elevated that makes her ovarian mass suspicious although other entities such as endometriosis can cause these values to be elevated. Make sure that she schedules her CT scan at time of her menses. I 1 an abdominal pelvic CT scan with and without contrast. Also make appointment for her to see the GYN oncologist here the [facility].   Anderson [place], Referral to gyn onc needed. CT already scheduled.  Thanks!!!

## 2014-09-21 ENCOUNTER — Other Ambulatory Visit: Payer: Self-pay | Admitting: Gynecology

## 2014-09-21 ENCOUNTER — Encounter (HOSPITAL_COMMUNITY): Payer: Self-pay

## 2014-09-21 ENCOUNTER — Ambulatory Visit (HOSPITAL_COMMUNITY)
Admission: RE | Admit: 2014-09-21 | Discharge: 2014-09-21 | Disposition: A | Discharge status: Home / Self Care | Payer: 59 | Source: Ambulatory Visit | Attending: Gynecology | Admitting: Gynecology

## 2014-09-21 DIAGNOSIS — N9489 Other specified conditions associated with female genital organs and menstrual cycle: Secondary | ICD-10-CM

## 2014-09-21 DIAGNOSIS — N949 Unspecified condition associated with female genital organs and menstrual cycle: Secondary | ICD-10-CM | POA: Diagnosis present

## 2014-09-21 MED ORDER — IOHEXOL 300 MG/ML  SOLN
50.0000 mL | Freq: Once | INTRAMUSCULAR | Status: AC | PRN
  Administered 2014-09-21: 50 mL via ORAL

## 2014-09-21 MED ORDER — IOHEXOL 300 MG/ML  SOLN
100.0000 mL | Freq: Once | INTRAMUSCULAR | Status: AC | PRN
  Administered 2014-09-21: 100 mL via INTRAVENOUS

## 2014-09-25 ENCOUNTER — Ambulatory Visit: Payer: 59 | Attending: Gynecology | Admitting: Gynecology

## 2014-09-25 ENCOUNTER — Encounter: Payer: Self-pay | Admitting: Gynecologic Oncology

## 2014-09-25 ENCOUNTER — Encounter: Payer: Self-pay | Admitting: Gynecology

## 2014-09-25 VITALS — BP 125/70 | HR 80 | Temp 98.6°F | Resp 18 | Ht 61.0 in | Wt 130.0 lb

## 2014-09-25 DIAGNOSIS — N9489 Other specified conditions associated with female genital organs and menstrual cycle: Secondary | ICD-10-CM

## 2014-09-25 DIAGNOSIS — N949 Unspecified condition associated with female genital organs and menstrual cycle: Secondary | ICD-10-CM | POA: Diagnosis not present

## 2014-09-25 NOTE — Progress Notes (Signed)
Consult Note: Gyn-Onc   Marie Hill 34 y.o. female  Chief Complaint  Patient presents with  . adnexal mass    New consult    Assessment : Complex pelvic mass with elevated CA-125. Given the patient's age, certainly possible that she has endometriosis.  Plan: I would recommend the patient undergo evaluation of this mass. I believe that it could be removed using the surgical robot. We would attempt to do an ovarian cystectomy if all possible as the  patient is very desirous of preserving fertility and I reassured her we will be as conservative as possible. Risks of surgery were reviewed and questions answered. Surgery will be scheduled with Dr. Everitt Amber.   HPI: 34 year old Asian female seen in consultation request of Dr. Uvaldo Rising regarding management of a complex pelvic mass and elevated CA-125. 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 A pelvic mass was identified on pelvic examination and an ultrasound obtained showing:  Uterus measures 6.8 x 5.5 x 4.8 cm endometrial stripe 10.9 mm (last menstrual period 08/18/2014). The right ovary will there was a corpus luteum cyst was measured 14 x 16 x 14 mm with noted color flow in the periphery. Left ovary not seen but a large adnexal mass measuring 12.1 x 9.4 x 8.4 cm defect using internal low level echoes homogeneous echoes along with a solid calcification in the mass which measured 3.6 x 1.3 x 2.0 cm with arterial blood flow seen within the wall of the mass. There was no fluid in the cul-de-sac.  Follow-up CT scan was obtained showing: 1. 11.8 cm multiloculated cystic mass in the right adnexal space, concerning for neoplasm. While there is some mild smooth wall thickening along the medial margin of the lesion, there is no evidence for mural nodule or septal irregularity/thickening by CT and this lesion may be benign. 2. No evidence for ascites. 3. No lymphadenopathy in the pelvis.  CA-125 was 164  units per mL. Roma score was 2.83 (normal for a premenopausal woman is less than 1.31)  Patient has no other gynecologic history. Pap smears within normal. She has regular cyclic menses 14-97 day intervals. She does not have any significant dysmenorrhea or dyspareunia.  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 At that time, [facility], she received one month and prophylactic treatment. Subsequently she's done well She has had normal chest x-rays      Review of Systems:10 point review of systems is negative except as noted in interval history.   Vitals: Blood pressure 125/70, pulse 80, temperature 98.6 F (37 C), temperature source Oral, resp. rate 18, height 5\' 1"  (1.549 m), weight 130 lb (58.968 kg), last menstrual period 08/14/2014, SpO2 100 %.  Physical Exam: General : The patient is a healthy woman in no acute distress.  HEENT: normocephalic, extraoccular movements normal; neck is supple without thyromegally  Lynphnodes: Supraclavicular and inguinal nodes not enlarged  Abdomen: Soft, non-tender, no ascites, no organomegally, no masses, no hernias  Pelvic:  EGBUS: Normal female  Vagina: Normal, no lesions  Urethra and Bladder: Normal, non-tender  Cervix: Nulliparous Uterus: Anterior normal shape size and consistency Bi-manual examination: There is fullness throughout the pelvis, but am unable to specifically differentiate a mass.  Rectal: normal sphincter tone, no masses, no blood  Lower extremities: No edema or varicosities. Normal range of motion      No Known Allergies  History reviewed. No pertinent past  medical history.  No past surgical history on file.  Current Outpatient Prescriptions  Medication Sig Dispense Refill  . amoxicillin-clavulanate (AUGMENTIN) 875-125 MG per tablet Take 1 tablet by mouth 2 (two) times daily. (Patient not taking: Reported on 08/12/2014) 30 tablet 0  . CIPRODEX otic suspension     .  fluconazole (DIFLUCAN) 150 MG tablet Take 1 tablet (150 mg total) by mouth once. Repeat if needed (Patient not taking: Reported on 08/12/2014) 2 tablet 0  . fluconazole (DIFLUCAN) 150 MG tablet     . fluocinolone (SYNALAR) 0.01 % external solution     . ketoconazole (NIZORAL) 2 % shampoo     . terconazole (TERAZOL 7) 0.4 % vaginal cream Place 1 applicator vaginally at bedtime. (Patient not taking: Reported on 08/12/2014) 45 g 0  . triamcinolone cream (KENALOG) 0.1 %      No current facility-administered medications for this visit.    Social History   Social History  . Marital Status: Single    Spouse Name: N/A  . Number of Children: N/A  . Years of Education: N/A   Occupational History  . Not on file.   Social History Main Topics  . Smoking status: Never Smoker   . Smokeless tobacco: Not on file  . Alcohol Use: 0.0 oz/week    0 Standard drinks or equivalent per week     Comment: rarely a beer  . Drug Use: No  . Sexual Activity: Yes   Other Topics Concern  . Not on file   Social History Narrative   Marital status: engaged; together with fiance x 6 years.      Children: none      Lives: with fiance; from [place]; moved to [place] at age 40.      Employment:  Cytogeneticist.      Tobacco: none      Alcohol: weekends      Drugs: none      Exercise:  sporadic    Family History  Problem Relation Age of Onset  . Hyperlipidemia Mother   . Hyperlipidemia Father       Alvino Chapel, MD 09/25/2014, 12:30 PM

## 2014-09-25 NOTE — Patient Instructions (Signed)
Plan for surgery with Dr. Everitt Amber on August 30.  You will be scheduled for a robotic assisted cystectomy.  We will let you know if we have a cancellation and we will move your surgery up.                  Preparing for your Surgery  Plan for surgery on August 30 with Dr. Denman George.  Pre-operative Testing -You will receive a phone call from presurgical testing at [facility] to arrange for a pre-operative testing appointment before your surgery.  This appointment normally occurs one to two weeks before your scheduled surgery.   -Bring your insurance card, copy of an advanced directive if applicable, medication list  -At that visit, you will be asked to sign a consent for a possible blood transfusion in case a transfusion becomes necessary during surgery.  The need for a blood transfusion is rare but having consent is a necessary part of your care.     -You should not be taking blood thinners or aspirin at least ten days prior to surgery unless instructed by your surgeon.  Day Before Surgery at [facility] will be asked to take in only clear liquids the day before surgery.  Examples of clear liquids include broths, jello, and clear juices.  Avoid carbonated beverages.  You may also be advised to perform a Miralax bowel prep or fleets enema the night before your surgery based off of your provider's recommendations.  You will be advised to have nothing to eat or drink after midnight the evening before.    Your role in recovery Your role is to become active as soon as directed by your doctor, while still giving yourself time to heal.  Rest when you feel tired. You will be asked to do the following in order to speed your recovery:  - Cough and breathe deeply. This helps toclear and expand your lungs and can prevent pneumonia. You may be given a spirometer to practice deep breathing. A staff member will show you how to use the spirometer. - Do mild physical activity. Walking or moving your legs  help your circulation and body functions return to normal. A staff member will help you when you try to walk and will provide you with simple exercises. Do not try to get up or walk alone the first time. - Actively manage your pain. Managing your pain lets you move in comfort. We will ask you to rate your pain on a scale of zero to 10. It is your responsibility to tell your doctor or nurse where and how much you hurt so your pain can be treated.  Special Considerations -If you are diabetic, you may be placed on insulin after surgery to have closer control over your blood sugars to promote healing and recovery.  This does not mean that you will be discharged on insulin.  If applicable, your oral antidiabetics will be resumed when you are tolerating a solid diet.  -Your final pathology results from surgery should be available by the Friday after surgery and the results will be relayed to you when available.  Blood Transfusion Information WHAT IS A BLOOD TRANSFUSION? A transfusion is the replacement of blood or some of its parts. Blood is made up of multiple cells which provide different functions.  Red blood cells carry oxygen and are used for blood loss replacement.  White blood cells fight against infection.  Platelets control bleeding.  Plasma helps clot blood.  Other blood  products are available for specialized needs, such as hemophilia or other clotting disorders. BEFORE THE TRANSFUSION  Who gives blood for transfusions?   You may be able to donate blood to be used at a later date on yourself (autologous donation).  Relatives can be asked to donate blood. This is generally not any safer than if you have received blood from a stranger. The same precautions are taken to ensure safety when a relative's blood is donated.  Healthy volunteers who are fully evaluated to make sure their blood is safe. This is blood bank blood. Transfusion therapy is the safest it has ever been in the practice  of medicine. Before blood is taken from a donor, a complete history is taken to make sure that person has no history of diseases nor engages in risky social behavior (examples are intravenous drug use or sexual activity with multiple partners). The donor's travel history is screened to minimize risk of transmitting infections, such as malaria. The donated blood is tested for signs of infectious diseases, such as HIV and hepatitis. The blood is then tested to be sure it is compatible with you in order to minimize the chance of a transfusion reaction. If you or a relative donates blood, this is often done in anticipation of surgery and is not appropriate for emergency situations. It takes many days to process the donated blood. RISKS AND COMPLICATIONS Although transfusion therapy is very safe and saves many lives, the main dangers of transfusion include:   Getting an infectious disease.  Developing a transfusion reaction. This is an allergic reaction to something in the blood you were given. Every precaution is taken to prevent this. The decision to have a blood transfusion has been considered carefully by your caregiver before blood is given. Blood is not given unless the benefits outweigh the risks.

## 2014-09-25 NOTE — Patient Instructions (Addendum)
Marie Hill  09/25/2014   Your procedure is scheduled on: 09/29/14  Report to [facility] Main  Entrance take [place]  elevators to 3rd floor to  [place] at 0800 AM.  Call this number if you have problems the morning of surgery [phone]   Remember: ONLY 1 PERSON MAY GO WITH YOU TO SHORT STAY TO GET  READY MORNING OF [place]certain.  Do not eat food or drink liquids :After Midnight. TONIGHT     Take these medicines the morning of surgery with A SIP OF WATER: NONE                               You may not have any metal on your body including hair pins and              piercings  Do not wear jewelry, make-up, lotions, powders or perfumes, deodorant             Do not wear nail polish.  Do not shave  48 hours prior to surgery.              Men may shave face and neck.   Do not bring valuables to the hospital. [place].  Contacts, dentures or bridgework may not be worn into surgery.  Leave suitcase in the car. After surgery it may be brought to your room.     Patients discharged the day of surgery will not be allowed to drive home.  Name and phone number of your driver: Lance Bosch   [phone]  Special Instructions: N/A              Please read over the following fact sheets you were given: _____________________________________________________________________             [facility] - Preparing for Surgery Before surgery, you can play an important role.  Because skin is not sterile, your skin needs to be as free of germs as possible.  You can reduce the number of germs on your skin by washing with CHG (chlorahexidine gluconate) soap before surgery.  CHG is an antiseptic cleaner which kills germs and bonds with the skin to continue killing germs even after washing. Please DO NOT use if you have an allergy to CHG or antibacterial soaps.  If your skin becomes reddened/irritated stop using the CHG and inform your  nurse when you arrive at Short Stay. Do not shave (including legs and underarms) for at least 48 hours prior to the first CHG shower.  You may shave your face/neck. Please follow these instructions carefully:  1.  Shower with CHG Soap the night before surgery and the  morning of Surgery.  2.  If you choose to wash your hair, wash your hair first as usual with your  normal  shampoo.  3.  After you shampoo, rinse your hair and body thoroughly to remove the  shampoo.                           4.  Use CHG as you would any other liquid soap.  You can apply chg directly  to the skin and wash  Gently with a scrungie or clean washcloth.  5.  Apply the CHG Soap to your body ONLY FROM THE NECK DOWN.   Do not use on face/ open                           Wound or open sores. Avoid contact with eyes, ears mouth and genitals (private parts).                       Wash face,  Genitals (private parts) with your normal soap.             6.  Wash thoroughly, paying special attention to the area where your surgery  will be performed.  7.  Thoroughly rinse your body with warm water from the neck down.  8.  DO NOT shower/wash with your normal soap after using and rinsing off  the CHG Soap.                9.  Pat yourself dry with a clean towel.            10.  Wear clean pajamas.            11.  Place clean sheets on your bed the night of your first shower and do not  sleep with pets. Day of Surgery : Do not apply any lotions/deodorants the morning of surgery.  Please wear clean clothes to the hospital/surgery center.  FAILURE TO FOLLOW THESE INSTRUCTIONS MAY RESULT IN THE CANCELLATION OF YOUR SURGERY PATIENT SIGNATURE_________________________________  NURSE SIGNATURE__________________________________  ________________________________________________________________________  WHAT IS A BLOOD TRANSFUSION? Blood Transfusion Information  A transfusion is the replacement of blood or some of its  parts. Blood is made up of multiple cells which provide different functions.  Red blood cells carry oxygen and are used for blood loss replacement.  White blood cells fight against infection.  Platelets control bleeding.  Plasma helps clot blood.  Other blood products are available for specialized needs, such as hemophilia or other clotting disorders. BEFORE THE TRANSFUSION  Who gives blood for transfusions?   Healthy volunteers who are fully evaluated to make sure their blood is safe. This is blood bank blood. Transfusion therapy is the safest it has ever been in the practice of medicine. Before blood is taken from a donor, a complete history is taken to make sure that person has no history of diseases nor engages in risky social behavior (examples are intravenous drug use or sexual activity with multiple partners). The donor's travel history is screened to minimize risk of transmitting infections, such as malaria. The donated blood is tested for signs of infectious diseases, such as HIV and hepatitis. The blood is then tested to be sure it is compatible with you in order to minimize the chance of a transfusion reaction. If you or a relative donates blood, this is often done in anticipation of surgery and is not appropriate for emergency situations. It takes many days to process the donated blood. RISKS AND COMPLICATIONS Although transfusion therapy is very safe and saves many lives, the main dangers of transfusion include:  1. Getting an infectious disease. 2. Developing a transfusion reaction. This is an allergic reaction to something in the blood you were given. Every precaution is taken to prevent this. The decision to have a blood transfusion has been considered carefully by your caregiver before blood is given. Blood is not given unless the benefits outweigh  the risks. AFTER THE TRANSFUSION  Right after receiving a blood transfusion, you will usually feel much better and more energetic.  This is especially true if your red blood cells have gotten low (anemic). The transfusion raises the level of the red blood cells which carry oxygen, and this usually causes an energy increase.  The nurse administering the transfusion will monitor you carefully for complications. HOME CARE INSTRUCTIONS  No special instructions are needed after a transfusion. You may find your energy is better. Speak with your caregiver about any limitations on activity for underlying diseases you may have. SEEK MEDICAL CARE IF:   Your condition is not improving after your transfusion.  You develop redness or irritation at the intravenous (IV) site. SEEK IMMEDIATE MEDICAL CARE IF:  Any of the following symptoms occur over the next 12 hours:  Shaking chills.  You have a temperature by mouth above 102 F (38.9 C), not controlled by medicine.  Chest, back, or muscle pain.  People around you feel you are not acting correctly or are confused.  Shortness of breath or difficulty breathing.  Dizziness and fainting.  You get a rash or develop hives.  You have a decrease in urine output.  Your urine turns a dark color or changes to pink, red, or brown. Any of the following symptoms occur over the next 10 days:  You have a temperature by mouth above 102 F (38.9 C), not controlled by medicine.  Shortness of breath.  Weakness after normal activity.  The white part of the eye turns yellow (jaundice).  You have a decrease in the amount of urine or are urinating less often.  Your urine turns a dark color or changes to pink, red, or brown. Document Released: 01/28/2000 Document Revised: 04/24/2011 Document Reviewed: 09/16/2007 Exit[employer] Patient Information 2014 Exit[employer], [place].  _______________________________________________________________________   CLEAR LIQUID DIET   Foods Allowed                                                                     Foods Excluded  Coffee and tea, regular and  decaf                             liquids that you cannot  Plain Jell-O in any flavor                                             see through such as: Fruit ices (not with fruit pulp)                                     milk, soups, orange juice  Iced Popsicles                                    All solid food Carbonated beverages, regular and diet  Cranberry, grape and apple juices Sports drinks like Gatorade Lightly seasoned clear broth or consume(fat free) Sugar, honey syrup  Sample Menu Breakfast                                Lunch                                     Supper Cranberry juice                    Beef broth                            Chicken broth Jell-O                                     Grape juice                           Apple juice Coffee or tea                        Jell-O                                      Popsicle                                                Coffee or tea                        Coffee or tea  _____________________________________________________________________    Incentive Spirometer  An incentive spirometer is a tool that can help keep your lungs clear and active. This tool measures how well you are filling your lungs with each breath. Taking long deep breaths may help reverse or decrease the chance of developing breathing (pulmonary) problems (especially infection) following:  A long period of time when you are unable to move or be active. BEFORE THE PROCEDURE   If the spirometer includes an indicator to show your best effort, your nurse or respiratory therapist will set it to a desired goal.  If possible, sit up straight or lean slightly forward. Try not to slouch.  Hold the incentive spirometer in an upright position. INSTRUCTIONS FOR USE  3. Sit on the edge of your bed if possible, or sit up as far as you can in bed or on a chair. 4. Hold the incentive spirometer in an upright  position. 5. Breathe out normally. 6. Place the mouthpiece in your mouth and seal your lips tightly around it. 7. Breathe in slowly and as deeply as possible, raising the piston or the ball toward the top of the column. 8. Hold your breath for 3-5 seconds or for as long as possible. Allow the piston or ball to fall to the bottom of the column. 9. Remove the mouthpiece from your mouth and breathe out normally. 10. Rest for a few seconds and repeat Steps 1 through 7 at  least 10 times every 1-2 hours when you are awake. Take your time and take a few normal breaths between deep breaths. 11. The spirometer may include an indicator to show your best effort. Use the indicator as a goal to work toward during each repetition. 12. After each set of 10 deep breaths, practice coughing to be sure your lungs are clear. If you have an incision (the cut made at the time of surgery), support your incision when coughing by placing a pillow or rolled up towels firmly against it. Once you are able to get out of bed, walk around indoors and cough well. You may stop using the incentive spirometer when instructed by your caregiver.  RISKS AND COMPLICATIONS  Take your time so you do not get dizzy or light-headed.  If you are in pain, you may need to take or ask for pain medication before doing incentive spirometry. It is harder to take a deep breath if you are having pain. AFTER USE  Rest and breathe slowly and easily.  It can be helpful to keep track of a log of your progress. Your caregiver can provide you with a simple table to help with this. If you are using the spirometer at home, follow these instructions: [place] IF:   You are having difficultly using the spirometer.  You have trouble using the spirometer as often as instructed.  Your pain medication is not giving enough relief while using the spirometer.  You develop fever of 100.5 F (38.1 C) or higher. SEEK IMMEDIATE MEDICAL CARE IF:    You cough up bloody sputum that had not been present before.  You develop fever of 102 F (38.9 C) or greater.  You develop worsening pain at or near the incision site. MAKE SURE YOU:   Understand these instructions.  Will watch your condition.  Will get help right away if you are not doing well or get worse. Document Released: 06/12/2006 Document Revised: 04/24/2011 Document Reviewed: 08/13/2006 [facility] Patient Information 2014 [place], [place].   ________________________________________________________________________

## 2014-09-28 ENCOUNTER — Encounter (HOSPITAL_COMMUNITY)
Admission: RE | Admit: 2014-09-28 | Discharge: 2014-09-28 | Disposition: A | Discharge status: Home / Self Care | Payer: 59 | Source: Ambulatory Visit | Attending: Gynecologic Oncology | Admitting: Gynecologic Oncology

## 2014-09-28 ENCOUNTER — Encounter (HOSPITAL_COMMUNITY): Payer: Self-pay

## 2014-09-28 DIAGNOSIS — K660 Peritoneal adhesions (postprocedural) (postinfection): Secondary | ICD-10-CM | POA: Diagnosis not present

## 2014-09-28 DIAGNOSIS — N135 Crossing vessel and stricture of ureter without hydronephrosis: Secondary | ICD-10-CM | POA: Diagnosis not present

## 2014-09-28 DIAGNOSIS — Z3202 Encounter for pregnancy test, result negative: Secondary | ICD-10-CM | POA: Diagnosis not present

## 2014-09-28 DIAGNOSIS — N802 Endometriosis of fallopian tube: Secondary | ICD-10-CM | POA: Diagnosis not present

## 2014-09-28 DIAGNOSIS — N838 Other noninflammatory disorders of ovary, fallopian tube and broad ligament: Secondary | ICD-10-CM | POA: Diagnosis present

## 2014-09-28 DIAGNOSIS — R971 Elevated cancer antigen 125 [CA 125]: Secondary | ICD-10-CM | POA: Diagnosis not present

## 2014-09-28 DIAGNOSIS — N801 Endometriosis of ovary: Secondary | ICD-10-CM | POA: Diagnosis not present

## 2014-09-28 HISTORY — DX: Nonspecific reaction to tuberculin skin test without active tuberculosis: R76.11

## 2014-09-28 LAB — CBC
HCT: 39.2 % (ref 36.0–46.0)
HEMOGLOBIN: 12.3 g/dL (ref 12.0–15.0)
MCH: 25.9 pg — ABNORMAL LOW (ref 26.0–34.0)
MCHC: 31.4 g/dL (ref 30.0–36.0)
MCV: 82.5 fL (ref 78.0–100.0)
Platelets: 269 10*3/uL (ref 150–400)
RBC: 4.75 MIL/uL (ref 3.87–5.11)
RDW: 13.5 % (ref 11.5–15.5)
WBC: 8.2 10*3/uL (ref 4.0–10.5)

## 2014-09-28 LAB — BASIC METABOLIC PANEL
ANION GAP: 5 (ref 5–15)
BUN: 11 mg/dL (ref 6–20)
CALCIUM: 9.4 mg/dL (ref 8.9–10.3)
CO2: 28 mmol/L (ref 22–32)
Chloride: 104 mmol/L (ref 101–111)
Creatinine, Ser: 0.51 mg/dL (ref 0.44–1.00)
GFR calc Af Amer: >60 mL/min (ref >60)
Glucose, Bld: 109 mg/dL — ABNORMAL HIGH (ref 65–99)
Potassium: 4.2 mmol/L (ref 3.5–5.1)
Sodium: 137 mmol/L (ref 135–145)

## 2014-09-28 LAB — PREGNANCY, URINE: PREG TEST UR: NEGATIVE

## 2014-09-28 LAB — ABO/RH: ABO/RH(D): B POS

## 2014-09-28 NOTE — Progress Notes (Signed)
Chest x ray report 6/16 and records from care at health dept in [place] records placed on chart

## 2014-09-28 NOTE — Progress Notes (Signed)
Per Judeen Hammans, Infectious Disease ON CALL Nurse, patient does not nee any special testing at PST visit. Per anesthesia protocol orders is asymptomatic with no active pulmonary complaints so no chest x ray was done.  Left message on medical records line at [place] requesting copy of recent chest xray as nothing has been scanned into media at this time from OB/GYN.  Also requested records from ob/gyn oncology

## 2014-09-28 NOTE — Progress Notes (Signed)
Patient begin seen as new patient with Dr. Fermin Schwab.  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  Spoke with Enid Derry at [facility].  She stated the patient had a positive TB test when she arrived from [place] in 1997.  No medication was given since she was asymptomatic and she had a negative chest xray.  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  The Health Department contacted [facility] and there was an active case identified at that time.  She reported taking one month of therapy.  She is now being followed at [facility].  Since she is asymptomatic and had negative chest xray in June, she could be a candidate for preventative medication for 9 months but the patient declined.  She is not contagious per RN.  [place] Department's phone number is [phone].  Dr. Fermin Schwab notified of the above.  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  Records from [place] will be scanned in under media.

## 2014-09-29 ENCOUNTER — Ambulatory Visit (HOSPITAL_COMMUNITY)
Admission: RE | Admit: 2014-09-29 | Discharge: 2014-09-29 | Disposition: A | Discharge status: Home / Self Care | Payer: 59 | Source: Ambulatory Visit | Attending: Gynecologic Oncology | Admitting: Gynecologic Oncology

## 2014-09-29 ENCOUNTER — Encounter (HOSPITAL_COMMUNITY)
Admission: RE | Disposition: A | Discharge status: Home / Self Care | Payer: Self-pay | Source: Ambulatory Visit | Attending: Gynecologic Oncology

## 2014-09-29 ENCOUNTER — Encounter (HOSPITAL_COMMUNITY): Payer: Self-pay | Admitting: *Deleted

## 2014-09-29 ENCOUNTER — Ambulatory Visit (HOSPITAL_COMMUNITY): Payer: 59 | Admitting: Certified Registered Nurse Anesthetist

## 2014-09-29 DIAGNOSIS — N809 Endometriosis, unspecified: Secondary | ICD-10-CM | POA: Diagnosis not present

## 2014-09-29 DIAGNOSIS — Z3202 Encounter for pregnancy test, result negative: Secondary | ICD-10-CM | POA: Insufficient documentation

## 2014-09-29 DIAGNOSIS — N135 Crossing vessel and stricture of ureter without hydronephrosis: Secondary | ICD-10-CM | POA: Insufficient documentation

## 2014-09-29 DIAGNOSIS — R971 Elevated cancer antigen 125 [CA 125]: Secondary | ICD-10-CM | POA: Insufficient documentation

## 2014-09-29 DIAGNOSIS — N9489 Other specified conditions associated with female genital organs and menstrual cycle: Secondary | ICD-10-CM | POA: Diagnosis present

## 2014-09-29 DIAGNOSIS — N801 Endometriosis of ovary: Secondary | ICD-10-CM

## 2014-09-29 DIAGNOSIS — N802 Endometriosis of fallopian tube: Secondary | ICD-10-CM | POA: Insufficient documentation

## 2014-09-29 DIAGNOSIS — K660 Peritoneal adhesions (postprocedural) (postinfection): Secondary | ICD-10-CM | POA: Insufficient documentation

## 2014-09-29 HISTORY — PX: ROBOTIC ASSISTED LAPAROSCOPIC OVARIAN CYSTECTOMY: SHX6081

## 2014-09-29 HISTORY — DX: Seborrhea capitis: L21.0

## 2014-09-29 HISTORY — PX: LAPAROSCOPIC UNILATERAL SALPINGO OOPHERECTOMY: SHX5935

## 2014-09-29 LAB — TYPE AND SCREEN
ABO/RH(D): B POS
Antibody Screen: NEGATIVE

## 2014-09-29 SURGERY — EXCISION, CYST, OVARY, ROBOT-ASSISTED, LAPAROSCOPIC
Anesthesia: General

## 2014-09-29 MED ORDER — LACTATED RINGERS IV SOLN
INTRAVENOUS | Status: DC | PRN
Start: 2014-09-29 — End: 2014-09-29
  Administered 2014-09-29: 1000 mL via INTRAVENOUS

## 2014-09-29 MED ORDER — OXYCODONE-ACETAMINOPHEN 5-325 MG PO TABS: 1.0000 | ORAL_TABLET | Freq: Four times a day (QID) | ORAL | Status: DC | PRN

## 2014-09-29 MED ORDER — ARTIFICIAL TEARS OP OINT
TOPICAL_OINTMENT | OPHTHALMIC | Status: AC
  Filled 2014-09-29: qty 7

## 2014-09-29 MED ORDER — CEFAZOLIN SODIUM-DEXTROSE 2-3 GM-% IV SOLR
INTRAVENOUS | Status: DC | PRN
  Administered 2014-09-29: 2 g via INTRAVENOUS

## 2014-09-29 MED ORDER — CEFAZOLIN SODIUM-DEXTROSE 2-3 GM-% IV SOLR
INTRAVENOUS | Status: AC
  Filled 2014-09-29: qty 50

## 2014-09-29 MED ORDER — LIDOCAINE HCL (CARDIAC) 20 MG/ML IV SOLN
INTRAVENOUS | Status: DC | PRN
  Administered 2014-09-29: 60 mg via INTRAVENOUS

## 2014-09-29 MED ORDER — FENTANYL CITRATE (PF) 100 MCG/2ML IJ SOLN
INTRAMUSCULAR | Status: AC
  Filled 2014-09-29: qty 4

## 2014-09-29 MED ORDER — FENTANYL CITRATE (PF) 250 MCG/5ML IJ SOLN
INTRAMUSCULAR | Status: AC
  Filled 2014-09-29: qty 25

## 2014-09-29 MED ORDER — MIDAZOLAM HCL 2 MG/2ML IJ SOLN
INTRAMUSCULAR | Status: AC
  Filled 2014-09-29: qty 4

## 2014-09-29 MED ORDER — MIDAZOLAM HCL 5 MG/5ML IJ SOLN
INTRAMUSCULAR | Status: DC | PRN
  Administered 2014-09-29: 2 mg via INTRAVENOUS

## 2014-09-29 MED ORDER — HYDROMORPHONE HCL 1 MG/ML IJ SOLN
0.2500 mg | INTRAMUSCULAR | Status: DC | PRN
  Administered 2014-09-29 (×2): 0.25 mg via INTRAVENOUS

## 2014-09-29 MED ORDER — HYDROMORPHONE HCL 1 MG/ML IJ SOLN
INTRAMUSCULAR | Status: AC
  Filled 2014-09-29: qty 1

## 2014-09-29 MED ORDER — ACETAMINOPHEN 325 MG PO TABS: 650.0000 mg | ORAL_TABLET | ORAL | Status: DC | PRN

## 2014-09-29 MED ORDER — KETOROLAC TROMETHAMINE 30 MG/ML IJ SOLN
30.0000 mg | Freq: Four times a day (QID) | INTRAMUSCULAR | Status: DC
  Administered 2014-09-29: 30 mg via INTRAVENOUS
  Filled 2014-09-29: qty 1

## 2014-09-29 MED ORDER — NEOSTIGMINE METHYLSULFATE 10 MG/10ML IV SOLN
INTRAVENOUS | Status: DC | PRN
  Administered 2014-09-29: 3 mg via INTRAVENOUS

## 2014-09-29 MED ORDER — GLYCOPYRROLATE 0.2 MG/ML IJ SOLN
INTRAMUSCULAR | Status: DC | PRN
  Administered 2014-09-29: 0.4 mg via INTRAVENOUS
  Administered 2014-09-29: 0.2 mg via INTRAVENOUS

## 2014-09-29 MED ORDER — SODIUM CHLORIDE 0.9 % IV SOLN: 250.0000 mL | INTRAVENOUS | Status: DC | PRN

## 2014-09-29 MED ORDER — PROPOFOL 10 MG/ML IV BOLUS
INTRAVENOUS | Status: AC
  Filled 2014-09-29: qty 20

## 2014-09-29 MED ORDER — FENTANYL CITRATE (PF) 100 MCG/2ML IJ SOLN
INTRAMUSCULAR | Status: DC | PRN
Start: 2014-09-29 — End: 2014-09-29
  Administered 2014-09-29 (×7): 50 ug via INTRAVENOUS

## 2014-09-29 MED ORDER — ONDANSETRON HCL 4 MG/2ML IJ SOLN
INTRAMUSCULAR | Status: AC
  Filled 2014-09-29: qty 2

## 2014-09-29 MED ORDER — SODIUM CHLORIDE 0.9 % IJ SOLN: 3.0000 mL | INTRAMUSCULAR | Status: DC | PRN

## 2014-09-29 MED ORDER — LIDOCAINE HCL (CARDIAC) 20 MG/ML IV SOLN
INTRAVENOUS | Status: AC
  Filled 2014-09-29: qty 5

## 2014-09-29 MED ORDER — SUCCINYLCHOLINE CHLORIDE 20 MG/ML IJ SOLN
INTRAMUSCULAR | Status: DC | PRN
  Administered 2014-09-29: 80 mg via INTRAVENOUS

## 2014-09-29 MED ORDER — SODIUM CHLORIDE 0.9 % IJ SOLN: 3.0000 mL | Freq: Two times a day (BID) | INTRAMUSCULAR | Status: DC

## 2014-09-29 MED ORDER — ROCURONIUM BROMIDE 100 MG/10ML IV SOLN
INTRAVENOUS | Status: DC | PRN
  Administered 2014-09-29: 10 mg via INTRAVENOUS
  Administered 2014-09-29: 30 mg via INTRAVENOUS
  Administered 2014-09-29: 10 mg via INTRAVENOUS

## 2014-09-29 MED ORDER — METOCLOPRAMIDE HCL 5 MG/ML IJ SOLN
INTRAMUSCULAR | Status: DC | PRN
  Administered 2014-09-29: 10 mg via INTRAVENOUS

## 2014-09-29 MED ORDER — GLYCOPYRROLATE 0.2 MG/ML IJ SOLN
INTRAMUSCULAR | Status: AC
  Filled 2014-09-29: qty 3

## 2014-09-29 MED ORDER — LACTATED RINGERS IV SOLN
INTRAVENOUS | Status: DC
  Administered 2014-09-29: 12:00:00 via INTRAVENOUS
  Administered 2014-09-29: 1000 mL via INTRAVENOUS
  Administered 2014-09-29: 14:00:00 via INTRAVENOUS

## 2014-09-29 MED ORDER — ACETAMINOPHEN 650 MG RE SUPP
650.0000 mg | RECTAL | Status: DC | PRN
  Filled 2014-09-29: qty 1

## 2014-09-29 MED ORDER — OXYCODONE HCL 5 MG PO TABS: 5.0000 mg | ORAL_TABLET | ORAL | Status: DC | PRN

## 2014-09-29 MED ORDER — PROPOFOL 10 MG/ML IV BOLUS
INTRAVENOUS | Status: DC | PRN
  Administered 2014-09-29: 120 mg via INTRAVENOUS

## 2014-09-29 MED ORDER — ROCURONIUM BROMIDE 100 MG/10ML IV SOLN
INTRAVENOUS | Status: AC
  Filled 2014-09-29: qty 1

## 2014-09-29 MED ORDER — NEOSTIGMINE METHYLSULFATE 10 MG/10ML IV SOLN
INTRAVENOUS | Status: AC
  Filled 2014-09-29: qty 1

## 2014-09-29 MED ORDER — MORPHINE SULFATE (PF) 10 MG/ML IV SOLN: 2.0000 mg | INTRAVENOUS | Status: DC | PRN

## 2014-09-29 MED ORDER — PHENYLEPHRINE HCL 10 MG/ML IJ SOLN
INTRAMUSCULAR | Status: DC | PRN
  Administered 2014-09-29: 80 ug via INTRAVENOUS

## 2014-09-29 MED ORDER — ONDANSETRON HCL 4 MG/2ML IJ SOLN
INTRAMUSCULAR | Status: DC | PRN
  Administered 2014-09-29: 4 mg via INTRAVENOUS

## 2014-09-29 SURGICAL SUPPLY — 52 items
APPLICATOR SURGIFLO ENDO (HEMOSTASIS) ×4 IMPLANT
CHLORAPREP W/TINT 26ML (MISCELLANEOUS) ×2 IMPLANT
CORDS BIPOLAR (ELECTRODE) ×2 IMPLANT
COVER TIP SHEARS 8 DVNC (MISCELLANEOUS) ×1 IMPLANT
COVER TIP SHEARS 8MM DA VINCI (MISCELLANEOUS) ×1
DRAPE ARM DVNC X/XI (DISPOSABLE) IMPLANT
DRAPE CAMERA ARM DAVINCI SIROB (DRAPES) ×2 IMPLANT
DRAPE COLUMN DVNC XI (DISPOSABLE) IMPLANT
DRAPE DA VINCI XI ARM (DISPOSABLE)
DRAPE DA VINCI XI COLUMN (DISPOSABLE)
DRAPE SHEET LG 3/4 BI-LAMINATE (DRAPES) ×4 IMPLANT
DRAPE SURG IRRIG POUCH 19X23 (DRAPES) ×2 IMPLANT
DRAPE TABLE BACK 44X90 PK DISP (DRAPES) IMPLANT
ELECT COAG MONOPOLAR (ELECTROSURGICAL) ×2
ELECT REM PT RETURN 9FT ADLT (ELECTROSURGICAL) ×2
ELECTRODE COAG MONOPOLAR (ELECTROSURGICAL) ×1 IMPLANT
ELECTRODE REM PT RTRN 9FT ADLT (ELECTROSURGICAL) ×1 IMPLANT
GLOVE BIO SURGEON STRL SZ 6 (GLOVE) ×6 IMPLANT
GLOVE BIO SURGEON STRL SZ 6.5 (GLOVE) ×4 IMPLANT
GOWN STRL REUS W/ TWL LRG LVL3 (GOWN DISPOSABLE) ×3 IMPLANT
GOWN STRL REUS W/TWL LRG LVL3 (GOWN DISPOSABLE) ×3
HOLDER FOLEY CATH W/STRAP (MISCELLANEOUS) ×2 IMPLANT
KIT ACCESSORY DA VINCI DISP (KITS) ×1
KIT ACCESSORY DVNC DISP (KITS) ×1 IMPLANT
KIT BASIN OR (CUSTOM PROCEDURE TRAY) ×2 IMPLANT
LIQUID BAND (GAUZE/BANDAGES/DRESSINGS) ×2 IMPLANT
MANIPULATOR UTERINE 4.5 ZUMI (MISCELLANEOUS) ×2 IMPLANT
OCCLUDER COLPOPNEUMO (BALLOONS) IMPLANT
PEN SKIN MARKING BROAD (MISCELLANEOUS) ×2 IMPLANT
POUCH SPECIMEN RETRIEVAL 10MM (ENDOMECHANICALS) ×2 IMPLANT
SEAL CANN UNIV 5-8 DVNC XI (MISCELLANEOUS) ×4 IMPLANT
SEAL XI 5MM-8MM UNIVERSAL (MISCELLANEOUS) ×4
SEPRAFILM PROCEDURAL PACK 3X5 (MISCELLANEOUS) ×2 IMPLANT
SET TRI-LUMEN FLTR TB AIRSEAL (TUBING) ×2 IMPLANT
SET TUBE IRRIG SUCTION NO TIP (IRRIGATION / IRRIGATOR) ×2 IMPLANT
SHEET LAVH (DRAPES) ×2 IMPLANT
SOLUTION ELECTROLUBE (MISCELLANEOUS) ×2 IMPLANT
SPOGE SURGIFLO 8M (HEMOSTASIS) ×1
SPONGE SURGIFLO 8M (HEMOSTASIS) ×1 IMPLANT
SUT VIC AB 0 CT1 27 (SUTURE)
SUT VIC AB 0 CT1 27XBRD ANTBC (SUTURE) IMPLANT
SUT VIC AB 4-0 PS2 27 (SUTURE) ×4 IMPLANT
SYR 50ML LL SCALE MARK (SYRINGE) IMPLANT
TOWEL OR 17X26 10 PK STRL BLUE (TOWEL DISPOSABLE) ×4 IMPLANT
TOWEL OR NON WOVEN STRL DISP B (DISPOSABLE) ×2 IMPLANT
TRAP SPECIMEN MUCOUS 40CC (MISCELLANEOUS) ×2 IMPLANT
TRAY FOLEY W/METER SILVER 14FR (SET/KITS/TRAYS/PACK) ×2 IMPLANT
TRAY FOLEY W/METER SILVER 16FR (SET/KITS/TRAYS/PACK) IMPLANT
TRAY LAPAROSCOPIC (CUSTOM PROCEDURE TRAY) ×2 IMPLANT
TROCAR 12M 150ML BLUNT (TROCAR) ×2 IMPLANT
TROCAR BLADELESS OPT 5 100 (ENDOMECHANICALS) ×4 IMPLANT
WATER STERILE IRR 1500ML POUR (IV SOLUTION) ×4 IMPLANT

## 2014-09-29 NOTE — Discharge Instructions (Signed)
Unilateral Salpingo-Oophorectomy, Care After Refer to this sheet in the next few weeks. These instructions provide you with information on caring for yourself after your procedure. Your health care provider may also give you more specific instructions. Your treatment has been planned according to current medical practices, but problems sometimes occur. Call your health care provider if you have any problems or questions after your procedure. WHAT TO EXPECT AFTER THE PROCEDURE After your procedure, it is typical to have the following:  Abdominal pain that can be controlled with pain medicine.  Vaginal spotting.  Constipation. HOME CARE INSTRUCTIONS   Get plenty of rest and sleep.  Only take over-the-counter or prescription medicines as directed by your health care provider. Do not take aspirin. It can cause bleeding.  Keep incision areas clean and dry. Remove or change any bandages (dressings) only as directed by your health care provider.  Follow your health care provider's advice regarding diet.  Drink enough fluids to keep your urine clear or pale yellow.  Limit exercise and activities as directed by your health care provider. Do not lift anything heavier than 5 pounds (2.3 kg) until your health care provider approves.  Do not drive until your health care provider approves.  Do not drink alcohol until your health care provider approves.  Do not have sexual intercourse until your health care provider says it is OK.  Take your temperature twice a day and write it down.  If you become constipated, you may:  Ask your health care provider about taking a mild laxative.  Add more fruit and bran to your diet.  Drink more fluids.  Follow up with your health care provider as directed. SEEK MEDICAL CARE IF:   You have swelling or redness in the incision area.  You develop a rash.  You feel lightheaded.  You have pain that is not controlled with medicine.  You have pain,  swelling, or redness where the IV access tube was placed. SEEK IMMEDIATE MEDICAL CARE IF:  You have a fever.  You develop increasing abdominal pain.  You see pus coming out of the incision, or the incision is separating.  You notice a bad smell coming from the wound or dressing.  You have excessive vaginal bleeding.  You feel sick to your stomach (nauseous) and vomit.  You have leg or chest pain.  You have pain when you urinate.  You develop shortness of breath.  You pass out. Document Released: 11/26/2008 Document Revised: 11/20/2012 Document Reviewed: 07/24/2012 [facility] Patient Information 2015 [place], [place]. This information is not intended to replace advice given to you by your health care provider. Make sure you discuss any questions you have with your health care provider.  Post Anesthesia Home Care Instructions  Activity: Get plenty of rest for the remainder of the day. A responsible adult should stay with you for 24 hours following the procedure.  For the next 24 hours, DO NOT: -Drive a car -Paediatric nurse -Drink alcoholic beverages -Take any medication unless instructed by your physician -Make any legal decisions or sign important papers.  Meals: Start with liquid foods such as gelatin or soup. Progress to regular foods as tolerated. Avoid greasy, spicy, heavy foods. If nausea and/or vomiting occur, drink only clear liquids until the nausea and/or vomiting subsides. Call your physician if vomiting continues.  Special Instructions/Symptoms: Your throat may feel dry or sore from the anesthesia or the breathing tube placed in your throat during surgery. If this causes discomfort, gargle with warm salt  water. The discomfort should disappear within 24 hours.  If you had a scopolamine patch placed behind your ear for the management of post- operative nausea and/or vomiting:  1. The medication in the patch is effective for 72 hours, after which it should be  removed.  Wrap patch in a tissue and discard in the trash. Wash hands thoroughly with soap and water. 2. You may remove the patch earlier than 72 hours if you experience unpleasant side effects which may include dry mouth, dizziness or visual disturbances. 3. Avoid touching the patch. Wash your hands with soap and water after contact with the patch.

## 2014-09-29 NOTE — Anesthesia Preprocedure Evaluation (Addendum)
Anesthesia Evaluation  Patient identified by MRN, date of birth, ID band Patient awake    Reviewed: Allergy & Precautions, NPO status , Patient's Chart, lab work & pertinent test results  Airway Mallampati: II  TM Distance: >3 FB Neck ROM: Full    Dental no notable dental hx. (+)    Pulmonary neg pulmonary ROS,  breath sounds clear to auscultation  Pulmonary exam normal       Cardiovascular negative cardio ROS Normal cardiovascular examRhythm:Regular Rate:Normal     Neuro/Psych negative neurological ROS  negative psych ROS   GI/Hepatic negative GI ROS, Neg liver ROS,   Endo/Other  negative endocrine ROS  Renal/GU negative Renal ROS  negative genitourinary   Musculoskeletal negative musculoskeletal ROS (+)   Abdominal   Peds negative pediatric ROS (+)  Hematology negative hematology ROS (+)   Anesthesia Other Findings   Reproductive/Obstetrics negative OB ROS                            Anesthesia Physical Anesthesia Plan  ASA: II  Anesthesia Plan: General   Post-op Pain Management:    Induction: Intravenous  Airway Management Planned: Oral ETT  Additional Equipment:   Intra-op Plan:   Post-operative Plan: Extubation in OR  Informed Consent: I have reviewed the patients History and Physical, chart, labs and discussed the procedure including the risks, benefits and alternatives for the proposed anesthesia with the patient or authorized representative who has indicated his/her understanding and acceptance.   Dental advisory given  Plan Discussed with: CRNA  Anesthesia Plan Comments:         Anesthesia Quick Evaluation

## 2014-09-29 NOTE — H&P (View-Only) (Signed)
Consult Note: Gyn-Onc   [name] 34 y.o. female  Chief Complaint  Patient presents with  . adnexal mass    New consult    Assessment : Complex pelvic mass with elevated CA-125. Given the patient's age, certainly possible that she has endometriosis.  Plan: I would recommend the patient undergo evaluation of this mass. I believe that it could be removed using the surgical robot. We would attempt to do an ovarian cystectomy if all possible as the  patient is very desirous of preserving fertility and I reassured her we will be as conservative as possible. Risks of surgery were reviewed and questions answered. Surgery will be scheduled with Dr. Everitt Amber.   HPI: 34 year old Asian female seen in consultation request of Dr. Uvaldo Rising regarding management of a complex pelvic mass and elevated CA-125. 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 A pelvic mass was identified on pelvic examination and an ultrasound obtained showing:  Uterus measures 6.8 x 5.5 x 4.8 cm endometrial stripe 10.9 mm (last menstrual period 08/18/2014). The right ovary will there was a corpus luteum cyst was measured 14 x 16 x 14 mm with noted color flow in the periphery. Left ovary not seen but a large adnexal mass measuring 12.1 x 9.4 x 8.4 cm defect using internal low level echoes homogeneous echoes along with a solid calcification in the mass which measured 3.6 x 1.3 x 2.0 cm with arterial blood flow seen within the wall of the mass. There was no fluid in the cul-de-sac.  Follow-up CT scan was obtained showing: 1. 11.8 cm multiloculated cystic mass in the right adnexal space, concerning for neoplasm. While there is some mild smooth wall thickening along the medial margin of the lesion, there is no evidence for mural nodule or septal irregularity/thickening by CT and this lesion may be benign. 2. No evidence for ascites. 3. No lymphadenopathy in the pelvis.  CA-125 was 164  units per mL. Roma score was 2.83 (normal for a premenopausal woman is less than 1.31)  Patient has no other gynecologic history. Pap smears within normal. She has regular cyclic menses 09-98 day intervals. She does not have any significant dysmenorrhea or dyspareunia.  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 At that time, [facility] health Department, she received one month and prophylactic treatment. Subsequently she's done well She has had normal chest x-rays      Review of Systems:10 point review of systems is negative except as noted in interval history.   Vitals: Blood pressure 125/70, pulse 80, temperature 98.6 F (37 C), temperature source Oral, resp. rate 18, height 5\' 1"  (1.549 m), weight 130 lb (58.968 kg), last menstrual period 08/14/2014, SpO2 100 %.  Physical Exam: General : The patient is a healthy woman in no acute distress.  HEENT: normocephalic, extraoccular movements normal; neck is supple without thyromegally  Lynphnodes: Supraclavicular and inguinal nodes not enlarged  Abdomen: Soft, non-tender, no ascites, no organomegally, no masses, no hernias  Pelvic:  EGBUS: Normal female  Vagina: Normal, no lesions  Urethra and Bladder: Normal, non-tender  Cervix: Nulliparous Uterus: Anterior normal shape size and consistency Bi-manual examination: There is fullness throughout the pelvis, but am unable to specifically differentiate a mass.  Rectal: normal sphincter tone, no masses, no blood  Lower extremities: No edema or varicosities. Normal range of motion      No Known Allergies  History reviewed. No pertinent past  medical history.  No past surgical history on file.  Current Outpatient Prescriptions  Medication Sig Dispense Refill  . amoxicillin-clavulanate (AUGMENTIN) 875-125 MG per tablet Take 1 tablet by mouth 2 (two) times daily. (Patient not taking: Reported on 08/12/2014) 30 tablet 0  . CIPRODEX otic suspension     .  fluconazole (DIFLUCAN) 150 MG tablet Take 1 tablet (150 mg total) by mouth once. Repeat if needed (Patient not taking: Reported on 08/12/2014) 2 tablet 0  . fluconazole (DIFLUCAN) 150 MG tablet     . fluocinolone (SYNALAR) 0.01 % external solution     . ketoconazole (NIZORAL) 2 % shampoo     . terconazole (TERAZOL 7) 0.4 % vaginal cream Place 1 applicator vaginally at bedtime. (Patient not taking: Reported on 08/12/2014) 45 g 0  . triamcinolone cream (KENALOG) 0.1 %      No current facility-administered medications for this visit.    Social History   Social History  . Marital Status: Single    Spouse Name: N/A  . Number of Children: N/A  . Years of Education: N/A   Occupational History  . Not on file.   Social History Main Topics  . Smoking status: Never Smoker   . Smokeless tobacco: Not on file  . Alcohol Use: 0.0 oz/week    0 Standard drinks or equivalent per week     Comment: rarely a beer  . Drug Use: No  . Sexual Activity: Yes   Other Topics Concern  . Not on file   Social History Narrative   Marital status: engaged; together with fiance x 6 years.      Children: none      Lives: with fiance; from [place]; moved to [place] at age 34.      Employment:  Cytogeneticist.      Tobacco: none      Alcohol: weekends      Drugs: none      Exercise:  sporadic    Family History  Problem Relation Age of Onset  . Hyperlipidemia Mother   . Hyperlipidemia Father       Alvino Chapel, MD 09/25/2014, 12:30 PM

## 2014-09-29 NOTE — Progress Notes (Addendum)
Patient crying when her boyfriend told her Dr Denman George removed her ovary. RN offers to get Dr Denman George up to 1301 to speak to her; however, patient refuses that suggestion. RN speaks to her boyfriend. Encourages him to assist her to post op follow up appointment so that all questions are answered about child bearing potential.  Patient transferred from surgical stretcher to recliner chair. Patient tolerated well.   1545  Up to ambulate in hallway. Patient became nauseated without vomiting. Back to recliner.  1620  Up to bathroom. Able to void.

## 2014-09-29 NOTE — Op Note (Signed)
OPERATIVE NOTE 09/29/14 Surgeon: Donaciano Eva   Assistants: Dr Lahoma Crocker (an MD assistant was necessary for tissue manipulation, management of robotic instrumentation, retraction and positioning due to the complexity of the case and hospital policies).   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  Surgeon: Donaciano Eva  Assistant Surgeon: Lahoma Crocker MD  Anesthesia: GET  Urine Output: 100  Operative Findings:  : retroperitoneal left ovarian mass measuring 12cm, replacing left ovary, densely adherent to sigmoid colon, left ovarian fossa and uterus and infiltrating into the retroperitoneum on the left causing adherence to the left ureter which required extensive ureterolysis to mobilize off the mass. The rectum was adherent to the posterior uterine lower segment and cervix and the right ovary was grossly normal with a corpus luteum. The right fallopian tube was slightly clubbed. Left ovarian cyst consistent with endometrioma with chocolate colored fluid.  Estimated Blood Loss:  125      Total IV Fluids: 1,000 ml         Specimens: left tube and ovary, washings         Complications:  None; patient tolerated the procedure well.         Disposition: PACU - hemodynamically stable.  Procedure Details  The patient was seen in the Holding Room. The risks, benefits, complications, treatment options, and expected outcomes were discussed with the patient.  The patient concurred with the proposed plan, giving informed consent.  The site of surgery properly noted/marked. The patient was identified as Marie Hill and the procedure  verified as a Robotic-assisted hysterectomy with bilateral salpingo oophorectomy. A Time Out was held and the above information confirmed.  After induction of anesthesia, the patient was draped and prepped in the usual sterile manner. Pt was placed in supine position after anesthesia and draped and prepped in the usual sterile manner. The abdominal drape was placed after the CholoraPrep had been allowed to dry for 3 minutes.  Her arms were tucked to her side with all appropriate precautions.  The were stabilized with padded shoulder blocks applied to the acromium processes.  The patient was placed in the semi-lithotomy position in [place].  The perineum was prepped with Betadine.  Foley catheter was placed.  A sterile speculum was placed in the vagina.  The cervix was grasped with a single-tooth tenaculum and dilated with Kennon Rounds dilators.  The ZUMI uterine manipulator was placed without difficulty.  A pneum occluder balloon was placed over the manipulator.  A second time-out was performed.  OG tube placement was confirmed and to suction.    Procedure: Next, a 5 mm skin incision was made 1 cm below the subcostal margin in the midclavicular line.  The 5 mm Optiview port and scope was used for direct entry.  Opening pressure was under 10 mm CO2.  The abdomen was insufflated and the findings were noted as above.   At this point and all points during the procedure, the patient's intra-abdominal pressure did not exceed 15 mmHg. Next, a 10 mm skin incision was made in the umbilicus and a right and left port was placed about 10 cm lateral to the robot port on the right and left side.  All ports were placed under direct visualization.  The patient was placed in steep Trendelenburg.  Bowel was away into the upper abdomen.  The robot was docked in the normal manner.  Washings were obtained.  The sigmoid colon was meticulously dissected free from the ovarian mass on the left by using sharp dissection. This  skeletonized the left IP ligament. The sharp dissection continued medially and under the left ovarian mass which was densely adherent to the left ovarian foss and posterior uterus. This lysis of adhesions continued for approximately 2 hours to normalize anatomy.  The peritoneum lateral to the left tube and ovary were incised with monopolar and the retroperitoneal space was entered and bluntly developed along the pararectal space. The ureter was identified in the retroperitoneal space and was densely adherent to the medial leaf of the broad ligament and left ovaran mass at its medial attachments. The left IP ligament was skeletonized from sigmoid adhesions and the ureter using sharp dissection and the IP ligament ws sealed with bipolar energy and then transected Using sharp scissor dissection, the ureter was skeletonized 360 degrees and carefully dissected free from its peritoneal and retroperitoneal attachments from the pelvic brim to its passage under the uterine artery. This mobilized it off of the ovarian mass.  The ovarian mass was then sharply dissected from the ovarian fossa peritonum with care to observe and avoid the course of the skeletonized (and laterally mobilized) ureter. The uter-ovarian ligament was skeletonized, and, using bipolar and monopolar energy was sealed and transected in a hemostatic fashion. The ovary was placed in an endocatch bag.  The rectal adhesions were then sharply transected to free the rectosgmoid colon from the posterior cervix and right and left uterosacral ligaments.  Hemostasis was observed at all sites. It was reinforced in the left retroperitoneum with surgiflow.  A seprafilm slurry was created with saline and used to coat the right tube and ovary and cul de sac with adhesion prevention material.  The robotic instruments were removed and the robot was undocked. The left tube and ovary were morcellated in the bag and retrieved from the left upper quadrant port.  Frozen section confirmed a benign cyst.  At this point in the procedure was completed.  The 10 mm ports were closed with Vicryl on a UR-5 needle and the fascia was closed with 0 Vicryl on a UR-5 needle.  The skin was closed with 4-0 Vicryl in a subcuticular manner.  Dermabond was applied.  Sponge, lap and needle counts correct x 2.  The patient was taken to the recovery room in stable condition.  The vagina was swabbed with  minimal bleeding noted.   All instrument and needle counts were correct x  3.   The patient was transferred to the recovery room in a stable condition.   Donaciano Eva, MD

## 2014-09-29 NOTE — Anesthesia Procedure Notes (Signed)
Procedure Name: Intubation Date/Time: 09/29/2014 10:36 AM Performed by: Montel Clock Pre-anesthesia Checklist: Patient identified, Emergency Drugs available, Suction available, Patient being monitored and Timeout performed Patient Re-evaluated:Patient Re-evaluated prior to inductionOxygen Delivery Method: Circle system utilized Preoxygenation: Pre-oxygenation with 100% oxygen Intubation Type: IV induction Ventilation: Mask ventilation without difficulty and Oral airway inserted - appropriate to patient size Laryngoscope Size: Mac and 3 Grade View: Grade II Tube type: Oral Tube size: 7.0 mm Number of attempts: 1 Airway Equipment and Method: Stylet Placement Confirmation: ETT inserted through vocal cords under direct vision,  positive ETCO2 and breath sounds checked- equal and bilateral Secured at: 21 cm Tube secured with: Tape Dental Injury: Teeth and Oropharynx as per pre-operative assessment

## 2014-09-29 NOTE — Interval H&P Note (Signed)
History and Physical Interval Note:  09/29/2014 9:32 AM  Marie Hill  has presented today for surgery, with the diagnosis of OVARIAN MASS  The various methods of treatment have been discussed with the patient and family. After consideration of risks, benefits and other options for treatment, the patient has consented to  Procedure(s): XI ROBOTIC [place] (N/A) LAPAROSCOPIC UNILATERAL SALPINGO OOPHORECTOMY (N/A) as a surgical intervention .  The patient's history has been reviewed, patient examined, no change in status, stable for surgery.  I have reviewed the patient's chart and labs.  Questions were answered to the patient's satisfaction.     Donaciano Eva

## 2014-09-29 NOTE — Transfer of Care (Signed)
Immediate Anesthesia Transfer of Care Note  Patient: Marie Hill  Procedure(s) Performed: Procedure(s): SI ROBOTIC ASSISTED LAPAROSCOPIC OVARIAN CYSTECTOMY (N/A) LAPAROSCOPIC LEFT SALPINGO OOPHORECTOMY ADHESIOLYSIS  (N/A)  Patient Location: PACU  Anesthesia Type:General  Level of Consciousness:  sedated, patient cooperative and responds to stimulation  Airway & Oxygen Therapy:Patient Spontanous Breathing and Patient connected to face mask oxgen  Post-op Assessment:  Report given to PACU RN and Post -op Vital signs reviewed and stable  Post vital signs:  Reviewed and stable  Last Vitals:  Filed Vitals:   09/29/14 0754  BP: 116/78  Pulse: 74  Temp: 36.7 C  Resp: 18    Complications: No apparent anesthesia complications

## 2014-09-30 ENCOUNTER — Encounter (HOSPITAL_COMMUNITY): Payer: Self-pay | Admitting: Gynecologic Oncology

## 2014-09-30 NOTE — Anesthesia Postprocedure Evaluation (Signed)
  Anesthesia Post-op Note  Patient: Marie Hill  Procedure(s) Performed: Procedure(s) (LRB): SI ROBOTIC ASSISTED LAPAROSCOPIC OVARIAN CYSTECTOMY (N/A) LAPAROSCOPIC LEFT SALPINGO OOPHORECTOMY ADHESIOLYSIS  (N/A)  Patient Location: PACU  Anesthesia Type: General  Level of Consciousness: awake and alert   Airway and Oxygen Therapy: Patient Spontanous Breathing  Post-op Pain: mild  Post-op Assessment: Post-op Vital signs reviewed, Patient's Cardiovascular Status Stable, Respiratory Function Stable, Patent Airway and No signs of Nausea or vomiting  Last Vitals:  Filed Vitals:   09/29/14 1632  BP: 109/60  Pulse: 70  Temp: 36.8 C  Resp: 16    Post-op Vital Signs: stable   Complications: No apparent anesthesia complications

## 2014-10-05 ENCOUNTER — Telehealth: Payer: Self-pay | Admitting: *Deleted

## 2014-10-05 NOTE — Telephone Encounter (Signed)
Notified pt of scheduled post-op appointment. Pt is scheduled to see Dr Denman George 11/02/2014 @ 1:30pm. Pt agreed with time and date

## 2014-10-12 ENCOUNTER — Ambulatory Visit (INDEPENDENT_AMBULATORY_CARE_PROVIDER_SITE_OTHER): Payer: 59 | Admitting: Family Medicine

## 2014-10-12 ENCOUNTER — Encounter: Payer: Self-pay | Admitting: Family Medicine

## 2014-10-12 VITALS — BP 104/70 | HR 77 | Temp 98.5°F | Resp 16 | Ht 61.5 in | Wt 126.0 lb

## 2014-10-12 DIAGNOSIS — N979 Female infertility, unspecified: Secondary | ICD-10-CM

## 2014-10-12 DIAGNOSIS — R7989 Other specified abnormal findings of blood chemistry: Secondary | ICD-10-CM

## 2014-10-12 DIAGNOSIS — Z23 Encounter for immunization: Secondary | ICD-10-CM

## 2014-10-12 DIAGNOSIS — N949 Unspecified condition associated with female genital organs and menstrual cycle: Secondary | ICD-10-CM

## 2014-10-12 DIAGNOSIS — N9489 Other specified conditions associated with female genital organs and menstrual cycle: Secondary | ICD-10-CM

## 2014-10-12 DIAGNOSIS — R7302 Impaired glucose tolerance (oral): Secondary | ICD-10-CM

## 2014-10-12 DIAGNOSIS — E785 Hyperlipidemia, unspecified: Secondary | ICD-10-CM

## 2014-10-12 DIAGNOSIS — R945 Abnormal results of liver function studies: Secondary | ICD-10-CM

## 2014-10-12 LAB — LIPID PANEL
CHOL/HDL RATIO: 5.8 ratio — AB (ref <=5.0)
Cholesterol: 167 mg/dL (ref 125–200)
HDL: 29 mg/dL — AB (ref >=46)
LDL Cholesterol: 102 mg/dL (ref <130)
Triglycerides: 178 mg/dL — ABNORMAL HIGH (ref <150)
VLDL: 36 mg/dL — ABNORMAL HIGH (ref <30)

## 2014-10-12 LAB — CBC WITH DIFFERENTIAL/PLATELET
BASOS PCT: 0 % (ref 0–1)
Basophils Absolute: 0 10*3/uL (ref 0.0–0.1)
EOS ABS: 0.3 10*3/uL (ref 0.0–0.7)
EOS PCT: 3 % (ref 0–5)
HCT: 36.4 % (ref 36.0–46.0)
Hemoglobin: 11.6 g/dL — ABNORMAL LOW (ref 12.0–15.0)
Lymphocytes Relative: 29 % (ref 12–46)
Lymphs Abs: 2.4 10*3/uL (ref 0.7–4.0)
MCH: 25.9 pg — AB (ref 26.0–34.0)
MCHC: 31.9 g/dL (ref 30.0–36.0)
MCV: 81.3 fL (ref 78.0–100.0)
MONO ABS: 0.5 10*3/uL (ref 0.1–1.0)
MONOS PCT: 6 % (ref 3–12)
MPV: 9.7 fL (ref 8.6–12.4)
Neutro Abs: 5.2 10*3/uL (ref 1.7–7.7)
Neutrophils Relative %: 62 % (ref 43–77)
PLATELETS: 430 10*3/uL — AB (ref 150–400)
RBC: 4.48 MIL/uL (ref 3.87–5.11)
RDW: 13.8 % (ref 11.5–15.5)
WBC: 8.4 10*3/uL (ref 4.0–10.5)

## 2014-10-12 LAB — HEMOGLOBIN A1C
Hgb A1c MFr Bld: 5.9 % — ABNORMAL HIGH (ref <5.7)
MEAN PLASMA GLUCOSE: 123 mg/dL — AB (ref <117)

## 2014-10-12 LAB — COMPREHENSIVE METABOLIC PANEL
ALBUMIN: 4.3 g/dL (ref 3.6–5.1)
ALT: 24 U/L (ref 6–29)
AST: 18 U/L (ref 10–30)
Alkaline Phosphatase: 81 U/L (ref 33–115)
BUN: 14 mg/dL (ref 7–25)
CHLORIDE: 102 mmol/L (ref 98–110)
CO2: 27 mmol/L (ref 20–31)
CREATININE: 0.54 mg/dL (ref 0.50–1.10)
Calcium: 9.7 mg/dL (ref 8.6–10.2)
GLUCOSE: 93 mg/dL (ref 65–99)
Potassium: 4.8 mmol/L (ref 3.5–5.3)
SODIUM: 140 mmol/L (ref 135–146)
Total Bilirubin: 0.4 mg/dL (ref 0.2–1.2)
Total Protein: 7.6 g/dL (ref 6.1–8.1)

## 2014-10-12 LAB — ACUTE HEP PANEL AND HEP B SURFACE AB
HCV AB: NEGATIVE
HEP B S AG: NEGATIVE
Hep A IgM: NONREACTIVE
Hep B C IgM: NONREACTIVE
Hep B S Ab: POSITIVE — AB

## 2014-10-12 NOTE — Patient Instructions (Signed)

## 2014-10-12 NOTE — Progress Notes (Signed)
Subjective:    Patient ID: Marie Hill, female    DOB: [birth year], 34 y.o.   MRN: [id number]  10/12/2014  Follow-up; repeat blood sugar; elevated cholesterol; pt has surgery 2 wks ago, right side cyst,ovary removal; and LFT   HPI This 34 y.o. female presents for four month follow-up:  1. Glucose Intolerance:  HgbA1c of 6.1. B:  Rice or skips, soup, no liquid or water or juice 12 ounces. Snack:none Lunch:  Rice, vegetables or soup noodle.  Water Snack:  Fruit, water Supper:  Rice, soup, vegetables.   Grilled steak or chicken.  Rare dessert.  Eats eggs.   No diabetes in family.   Weight down 9 pounds. Small amount of exercise.   Usually has increased appetite with increased exercise.    2.  Elevated LFTs: due for repeat labs.  Denies excessive alcohol intake or regular acetaminophen use.    3. Hyperlipidemia:  Eats red meat 1-2 times per week.  Eats a lot of pork.  Rare fried foods.    4.  Ovarian surgical resection for large ovarian mass:  Pathology negative.  Follow-up with gynecology 11-02-2014.  Robotic assisted.  Having regular bowel movements; has returned to work.    5. L otitis externa/media: resolved.  Released from care. S/p ENT evaluation; warranted drainage of TM.   6.   S/p dermatology evaluation; prescribed topical cream that was too expensive.    7. Infertility: boyfriend and pt to follow-up with gynecology this month to address infertility and next steps.   Review of Systems  Constitutional: Negative for fever, chills, diaphoresis and fatigue.  Eyes: Negative for visual disturbance.  Respiratory: Negative for cough and shortness of breath.   Cardiovascular: Negative for chest pain, palpitations and leg swelling.  Gastrointestinal: Negative for nausea, vomiting, abdominal pain, diarrhea and constipation.  Endocrine: Negative for cold intolerance, heat intolerance, polydipsia, polyphagia and polyuria.  Genitourinary: Negative for pelvic pain.  Skin: Negative for  rash.  Neurological: Negative for dizziness, tremors, seizures, syncope, facial asymmetry, speech difficulty, weakness, light-headedness, numbness and headaches.  Psychiatric/Behavioral: Negative for suicidal ideas, sleep disturbance, self-injury and dysphoric mood. The patient is not nervous/anxious.     Past Medical History  Diagnosis Date  . Glucose intolerance (pre-diabetes)     per pt history  . 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 Positive PPD, treated     states follow up chest x rays negative  . Dandruff    Past Surgical History  Procedure Laterality Date  . No past surgeries    . Robotic assisted laparoscopic ovarian cystectomy N/A 09/29/2014    Procedure: SI ROBOTIC ASSISTED LAPAROSCOPIC OVARIAN CYSTECTOMY;  Surgeon: Everitt Amber, MD;  Location: [facility];  Service: Gynecology;  Laterality: N/A;  . Laparoscopic unilateral salpingo oopherectomy N/A 09/29/2014    Procedure: LAPAROSCOPIC LEFT SALPINGO OOPHORECTOMY ADHESIOLYSIS ;  Surgeon: Everitt Amber, MD;  Location: [facility];  Service: Gynecology;  Laterality: N/A;   No Known Allergies Current Outpatient Prescriptions  Medication Sig Dispense Refill  . ketoconazole (NIZORAL) 2 % shampoo Apply 1 application topically 2 (two) times a week.      No current facility-administered medications for this visit.   Social History   Social History  . Marital Status: Single    Spouse Name: N/A  . Number of Children: N/A  . Years of Education: N/A   Occupational History  . Not  on file.   Social History Main Topics  . Smoking status: Never Smoker   . Smokeless tobacco: Never Used  . Alcohol Use: 0.0 oz/week    0 Standard drinks or equivalent per week     Comment: rarely a beer  . Drug Use: No  . Sexual Activity: Yes   Other Topics Concern  . Not on file   Social History Narrative   Marital status: engaged; together with fiance x 6 years.      Children: none       Lives: with fiance; from [place]; moved to [place] at age 16.      Employment:  Cytogeneticist.      Tobacco: none      Alcohol: weekends      Drugs: none      Exercise:  sporadic   Family History  Problem Relation Age of Onset  . Hyperlipidemia Mother   . Hyperlipidemia Father        Objective:    BP 104/70 mmHg  Pulse 77  Temp(Src) 98.5 F (36.9 C) (Oral)  Resp 16  Ht 5' 1.5" (1.562 m)  Wt 126 lb (57.153 kg)  BMI 23.42 kg/m2  SpO2 98%  LMP 10/10/2014 Physical Exam  Constitutional: She is oriented to person, place, and time. She appears well-developed and well-nourished. No distress.  HENT:  Head: Normocephalic and atraumatic.  Right Ear: External ear normal. Tympanic membrane is retracted. Tympanic membrane is not erythematous.  Left Ear: External ear normal. Tympanic membrane is retracted. Tympanic membrane is not erythematous.  Nose: Nose normal.  Mouth/Throat: Oropharynx is clear and moist.  Eyes: Conjunctivae and EOM are normal. Pupils are equal, round, and reactive to light.  Neck: Normal range of motion. Neck supple. Carotid bruit is not present. No thyromegaly present.  Cardiovascular: Normal rate, regular rhythm, normal heart sounds and intact distal pulses.  Exam reveals no gallop and no friction rub.   No murmur heard. Pulmonary/Chest: Effort normal and breath sounds normal. She has no wheezes. She has no rales.  Abdominal: Soft. Bowel sounds are normal. She exhibits no distension and no mass. There is no tenderness. There is no rebound and no guarding.  Well healing incisions abdomen x 4.  Lymphadenopathy:    She has no cervical adenopathy.  Neurological: She is alert and oriented to person, place, and time. No cranial nerve deficit.  Skin: Skin is warm and dry. No rash noted. She is not diaphoretic. No erythema. No pallor.  Psychiatric: She has a normal mood and affect. Her behavior is normal.   INFLUENZA VACCINE  ADMINISTERED.     Assessment & Plan:   1. Glucose intolerance (impaired glucose tolerance)   2. Elevated liver function tests   3. Hyperlipidemia   4. Need for influenza vaccination   5. Adnexal mass   6. Infertility, female, primary     1.  Glucose Intolerance: New.  Reviewed dietary recommendations in detail during visit; repeat labs; continue with weight loss and exercise. 2.  Elevated LFTs: New. Repeat today with hepatitis panel. 3.  Hyperlipidemia: New.  Recommend weight loss, exercise, low-cholesterol food choices; repeat today. 4.  Adnexal mass: New; s/p ovarian resection; pathology negative other than endometriosis noted. 5.  Infertility: persistent; follow-up this month for further evaluation. 6.  S/p influenza vaccine in office.   No orders of the defined types were placed in this encounter.    No Follow-up on file.   Makoto Sellitto Elayne Guerin, M.D. [facility] [address] [place], [place]  [address] [phone] phone [phone] fax

## 2014-11-02 ENCOUNTER — Ambulatory Visit: Payer: 59 | Attending: Gynecologic Oncology | Admitting: Gynecologic Oncology

## 2014-11-02 ENCOUNTER — Encounter: Payer: Self-pay | Admitting: Gynecologic Oncology

## 2014-11-02 DIAGNOSIS — N801 Endometriosis of ovary: Secondary | ICD-10-CM | POA: Insufficient documentation

## 2014-11-02 DIAGNOSIS — N9489 Other specified conditions associated with female genital organs and menstrual cycle: Secondary | ICD-10-CM

## 2014-11-02 DIAGNOSIS — N979 Female infertility, unspecified: Secondary | ICD-10-CM | POA: Diagnosis not present

## 2014-11-02 DIAGNOSIS — N949 Unspecified condition associated with female genital organs and menstrual cycle: Secondary | ICD-10-CM | POA: Diagnosis not present

## 2014-11-02 DIAGNOSIS — N80109 Endometriosis of ovary, unspecified side, unspecified depth: Secondary | ICD-10-CM

## 2014-11-02 NOTE — Progress Notes (Signed)
POSTOPERATIVE NOTE  Assessment:    34 y.o. year old with stage IV endometriosis.   S/p robotic assisted left salpingo-oophorectomy, lysis of adhesions and resection of endometriosis on 816/16.   Plan: 1) Pathology reports reviewed today 2) Treatment counseling - I discussed with trying that she has severe endometriosis. She is no visible disease postoperatively however I explained that endometriosis has a microscopic component as well. While we released her rectovaginal septal adhesions to her posterior uterus, these certainly are risk for recurring (though she is asymptomatic). My greatest concern is with respect to her fertility. 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 I discussed with the patient that data suggests that fecundity improves after resection of an endometrioma. Therefore I believe she is at an increased probability for spontaneous conception. She is in the luteal phase of her cycle and I encouraged her to attempt to conception this month. I also encouraged her to return to see Dr. Toney Rakes as soon as possible given her age of 34 and the need to expedite pregnancy to achieve a family and to prevent worsening of endometriosis. If she does not achieve pregnancy or decides that she does not desire family, I would encourage ovarian suppression with either a continuous oral contraception pills or placement of a progesterone releasing intrauterine device. She was given the opportunity to ask questions, which were answered to her satisfaction, and she is agreement with the above mentioned plan of care.  3)  Return to clinic on a prn basis. Return to see Dr Toney Rakes this fall.  HPI:  Marie Hill is a 34 y.o. year old G0P0000 initially seen in consultation on 8/12 referred by Dr Toney Rakes for primary infertility, a 12cm left ovarian mass.  She then underwent a robotic lysis of ashesions, LSO, and resection of endometriosis with ureterolysis for severe stage  IV endometriosis and a 12cm endometrioma on the left ovary (on 7/68/08) without complications.  Her postoperative course was uncomplicated.  Her final pathology revealed an endometrioma.  She is seen today for a postoperative check and to discuss her pathology results and ongoing plan.  Since discharge from the hospital, she is feeling improved. She has some numbness in her left anterior thigh (likely secondary to extensive dissection of the left retroperitoneum).  She has improving appetite, normal bowel and bladder function, and pain controlled with minimal PO medication. She has no other complaints today.    Review of systems: Constitutional:  She has no weight gain or weight loss. She has no fever or chills. Eyes: No blurred vision Ears, Nose, Mouth, Throat: No dizziness, headaches or changes in hearing. No mouth sores. Cardiovascular: No chest pain, palpitations or edema. Respiratory:  No shortness of breath, wheezing or cough Gastrointestinal: She has normal bowel movements without diarrhea or constipation. She denies any nausea or vomiting. She denies blood in her stool or heart burn. Genitourinary:  She denies pelvic pain, pelvic pressure or changes in her urinary function. She has no hematuria, dysuria, or incontinence. She has no irregular vaginal bleeding or vaginal discharge Musculoskeletal: Denies muscle weakness or joint pains.  Skin:  She has no skin changes, rashes or itching Neurological:  Denies dizziness or headaches. No neuropathy, no numbness or tingling. Psychiatric:  She denies depression or anxiety. Hematologic/Lymphatic:   No easy bruising or bleeding   Physical Exam: Last menstrual period 10/10/2014. General: Well dressed, well nourished in no apparent distress.   HEENT:  Normocephalic and atraumatic, no lesions.  Extraocular muscles  intact. Sclerae anicteric. Pupils equal, round, reactive. No mouth sores or ulcers. Thyroid is normal size, not nodular,  midline. Skin:  No lesions or rashes. Abdomen:  Soft, nontender, nondistended.  No palpable masses.  No hepatosplenomegaly.  No ascites. Normal bowel sounds.  No hernias.  Incisions are well healed Genitourinary: Normal EGBUS  Cervix normal.  No bleeding or discharge.  No cul de sac fullness. Extremities: No cyanosis, clubbing or edema.  No calf tenderness or erythema. No palpable cords. Psychiatric: Mood and affect are appropriate. Neurological: Awake, alert and oriented x 3. Sensation is intact, no neuropathy.  Musculoskeletal: No pain, normal strength and range of motion.   Donaciano Eva, MD

## 2014-11-02 NOTE — Patient Instructions (Signed)
Follow up with Dr. Toney Rakes.  Please call Dr. Denman George with any questions or concerns.  You may proceed with trying to get pregnant this month if you would like.

## 2014-11-17 ENCOUNTER — Encounter: Payer: Self-pay | Admitting: Gynecology

## 2014-11-17 ENCOUNTER — Ambulatory Visit (INDEPENDENT_AMBULATORY_CARE_PROVIDER_SITE_OTHER): Payer: 59 | Admitting: Gynecology

## 2014-11-17 VITALS — BP 124/80

## 2014-11-17 DIAGNOSIS — N809 Endometriosis, unspecified: Secondary | ICD-10-CM

## 2014-11-17 DIAGNOSIS — N970 Female infertility associated with anovulation: Secondary | ICD-10-CM

## 2014-11-17 MED ORDER — DOXYCYCLINE HYCLATE 100 MG PO CAPS: 100.0000 mg | ORAL_CAPSULE | Freq: Two times a day (BID) | ORAL | Status: DC

## 2014-11-17 NOTE — Patient Instructions (Signed)
Please call office as to speak with Marie Hill [phone] when you menstrual cycle begins to schedule HSG, semen analysis, an appointment to see Dr. Toney Rakes after the above tests are completed. Begin taking the antibiotically one tablet twice a day the day before the procedure for 3 days. Hysterosalpingography Hysterosalpingography is a procedure to look inside your uterus and fallopian tubes. During this procedure, contrast dye is injected into your uterus through your vagina and cervix to illuminate your uterus while X-ray pictures are taken. This procedure may help your health care provider determine whether you have uterine tumors, adhesions, or structural abnormalities. It is commonly used to help determine why a woman is unable to have children (infertility). The procedure usually lasts about 15-30 minutes. LET [facility] CARE PROVIDER KNOW ABOUT:  Any allergies you have.  All medicines you are taking, including vitamins, herbs, eye drops, creams, and over-the-counter medicines.  Previous problems you or members of your family have had with the use of anesthetics.  Any blood disorders you have.  Previous surgeries you have had.  Medical conditions you have. RISKS AND COMPLICATIONS  Generally, this is a safe procedure. However, as with any procedure, problems can occur. Possible problems include:  Infection in the lining of the uterus (endometritis) or fallopian tubes (salpingitis).  Damage or perforation of the uterus or fallopian tubes.  An allergic reaction to the contrast dye used to perform the X-ray. BEFORE THE PROCEDURE   Schedule the procedure after your period stops, but before your next ovulation. This is usually between day 5 and day 10 of your last period. Day 1 is the first day of your period.  Ask your health care provider about changing or stopping your regular medicines.  You may eat and drink as normal.  Empty your bladder before the procedure  begins. PROCEDURE  You may be given a medicine to relax you (sedative) or an over-the-counter pain medicine to lessen any discomfort during the procedure.  You will lie down on an X-ray table with your feet in stirrups.  A device called a speculum will be placed into your vagina. This allows your health care provider to see inside your vagina to the cervix.  The cervix will be washed with a special soap.  A thin, flexible tube will be passed through the cervix into the uterus.  Contrast dye will be put into this tube.  Several X-rays will be taken as the contrast dye spreads through the uterus and fallopian tubes.  The tube will be taken out after the procedure. AFTER THE PROCEDURE   Most of the contrast dye will flow out of your vagina naturally. You may want to wear a sanitary pad.  You may feel mild cramping and notice a little bleeding from your vagina. This should go away in 24 hours.  Ask when your test results will be ready. Make sure you get your test results. Document Released: 03/04/2004 Document Revised: 02/04/2013 Document Reviewed: 08/02/2012 [facility] Patient Information 2015 [place], [place]. This information is not intended to replace advice given to you by your health care provider. Make sure you discuss any questions you have with your health care provider.  In Vitro Fertilization In vitro fertilization (IVF) is a type of assisted reproductive technology. IVF involves a series of procedures used to treat fertility or genetic problems in order to assist with the conception of a baby. During IVF, eggs are retrieved from the ovaries and combined with sperm in a lab to fertilize the eggs. One  or more of the fertilized eggs (embryos) are inserted into the uterus through the cervix. Candidates for IVF include:  People who are infertile.  Women who underwent premature menopause or ovarian failure.  Women who had both ovaries removed. In this case, donor eggs must be  used.  Women who have damaged or blocked fallopian tubes. There is no age limit for having IVF, but it is not recommended for postmenopausal women. The ideal age for IVF is 82 years old or younger. Women age 57 and older are often counseled to consider using donor eggs during IVF to increase the chances of success. LET [facility] CARE PROVIDER KNOW ABOUT:   Any allergies you have.  All medicines you are taking, including vitamins, herbs, eye drops, creams, and over-the-counter medicines.  Previous problems you or members of your family have had with the use of anesthetics.  Any medical or genetic problems that you or members of your family have had.  Any blood disorders you have.  Previous surgeries you have had.  Previous pregnancies you have had.  Medical conditions you have.  Any excessive alcohol drinking or smoking you have done.  Any history of taking illegal drugs. RISKS AND COMPLICATIONS  Generally, IVF procedures are safe. However, as with any procedure, complications can occur. Possible complications include:  Bleeding and infection.  Problems with anesthesia.  Blood clots.  The procedure not working.  Having twins or multiples.  Increased risk of early delivery. BEFORE THE PROCEDURE  Before beginning a cycle of IVF, you and the donor may need various screenings to make sure IVF is the correct treatment option. Some people may not benefit from this type of assisted reproductive technology.   You and the donor will need to provide a complete medical history and the medical history of your families.  You and the donor will undergo a physical exam.  You and the donor may need blood tests to check for infectious diseases, including HIV.  Other tests that may be done include:  Testing of your ovaries to determine the quality and quantity of your eggs.  Hormone tests and ovulation testing.  An exam of your uterus. This may be done by using ultrasound after  liquid is injected into your uterus through your cervix (sonohysterography) or by using a thin, flexible tube with a tiny light and camera on the end of it(hysteroscope).  The donor's sperm will be taken and analyzed to see if they are normal, there are enough present to fertilize the egg, and they act normally after sexual intercourse (postcoital exam). PROCEDURE  IVF involves several steps. These procedures can be done at the health care provider's office or clinic. One cycle of IVF can take about 2 weeks, and more than one cycle may be required. The steps of IVF are:  Ovarian stimulation. If using your own eggs during IVF, at the start of a cycle you will begin treatment with man-made (synthetic) hormones. This stimulates your ovaries to produce multiple eggs, rather than the single egg that normally develops each month. Multiple eggs are needed because some eggs will not fertilize or develop normally after fertilization.   Egg retrieval. Using ultrasound images as a guide, your health care provider will insert a thin needle through your vagina and into the ovary and sacs (follicles) containing the eggs. The needle is connected to a suction device, which pulls the eggs and fluid out of each follicle, one at a time. The procedure is repeated for the other  ovary.  Insemination and fertilization. The sperm is mixed together with your eggs (insemination) and stored in an environmentally controlled chamber.The sperm usually enters (fertilizes) an egg a few hours after insemination.  Embryo transfer. Your health care provider will place the embryos into your uterus using a thin tube (catheter) containing the embryos. The catheter is inserted into your vagina, through your cervix, and into your uterus. The embryo transfer usually takes place 2-6 days after the egg retrieval. If successful, the embryo will stick to (implant) in the lining of your uterus about 6-10 days after egg retrieval. If an embryo  implants in the lining of the uterus and grows, pregnancy results. AFTER THE PROCEDURE   You may have to continue lying down for an hour or more before going home.  You may need to continue to take hormone therapy for about 3 months or as directed by your health care provider.  You may resume your normal diet and activities. Document Released: 01/13/2008 Document Revised: 10/02/2012 Document Reviewed: 08/08/2012 [facility] Patient Information 2015 [place], [place]. This information is not intended to replace advice given to you by your health care provider. Make sure you discuss any questions you have with your health care provider.

## 2014-11-17 NOTE — Progress Notes (Signed)
    Patient is a 34 year old gravida 0 who presented to the office today for infertility consultation. Patient had been referred to Dr. Everitt Amber GYN oncologist as a result of a left adnexal mass that had been noted during examination as part of her infertility evaluation. On August 16 of this year she took her to the operating room and performed a robotic laparoscopic Left salpingo-oophorectomy with lysis of pelvic adhesions and documented the following findings at time of surgery:   "retroperitoneal left ovarian mass measuring 12cm, replacing left ovary, densely adherent to sigmoid colon, left ovarian fossa and uterus and infiltrating into the retroperitoneum on the left causing adherence to the left ureter which required extensive ureterolysis to mobilize off the mass. The rectum was adherent to the posterior uterine lower segment and cervix and the right ovary was grossly normal with a corpus luteum. The right fallopian tube was slightly clubbed. Left ovarian cyst consistent with endometrioma with chocolate colored fluid."  Pathology report as follows:   Diagnosis Ovary and fallopian tube, left - FEATURES CONSISTENT WITH ENDOMETRIOMA/ENDOMETRIOTIC CYST WITH DEGENERATIVE CHANGES. - BENIGN FALLOPIAN TUBE WITH ENDOMETRIOSIS. - THERE IS NO EVIDENCE OF MALIGNANCY.  Patient is done well from her surgery and now would like to once again attempt to get pregnant. She states she has been with her same sexual partner for the past 6 years in which he has had a child with a previous partner. Patient has really been trying for the past 4 months. She reports her cycles were 28 days and now after her surgery there now every 24 days. Patient has not had any other form of pelvic surgery and denies any PID history in the past.  We had an extensive discussion due to the fact that she will be 34 years of age this month. We discussed that fertility decreases after 35 years of age. Also with stage IV endometriosis  there is a higher incidence of infertility associated with this disease. Also concerns that I had were the findings at time of her surgery that the GYN oncologist described that her right fallopian tube distally appeared to be clubbed. We also discussed that after the age of 11 she is an increased risk for miscarriage, hypertension, diabetes as well as fetal aneuploidy. We discussed also done in the event that her remaining fallopian tube is indeed blocked that I'm going to refer her to GYN infertility endocrinologist for consideration of in vitro fertilization. If her fallopian tube is open we will attempt with ovulation induction medication and timing of intercourse for 3 months and if unsuccessful then we'll recommend referral to endocrinologist as well.  Assessment/plan: Patient status post robotic less salpingo-oophorectomy secondary to large endometrioma and lysis of pelvic adhesion. Questionable clubbed right fallopian tube. Patient will be scheduled for HSG after the start of her next cycle. She will be prescribed Vibramycin 100 mg take 1 by mouth twice a day for 3 days starting the day before the HSG. Also will schedule husband for semen analysis. They will then return to my office after both sets are complete to discuss findings and plan course of management.  Greater than 50% of time was spent in counseling and coordinate care for this patient. Total consultation time 20 minutes.

## 2014-12-01 ENCOUNTER — Telehealth: Payer: Self-pay | Admitting: *Deleted

## 2014-12-01 DIAGNOSIS — N970 Female infertility associated with anovulation: Secondary | ICD-10-CM

## 2014-12-01 NOTE — Telephone Encounter (Signed)
Per note on 11/17/14 HSG will be scheduled. LMP:12/01/14  Appointment on 12/08/14 @ 8:30am at [facility] hospital, pt aware.

## 2014-12-08 ENCOUNTER — Ambulatory Visit (HOSPITAL_COMMUNITY): Admission: RE | Admit: 2014-12-08 | Payer: 59 | Source: Ambulatory Visit

## 2014-12-11 ENCOUNTER — Encounter (HOSPITAL_COMMUNITY): Payer: Self-pay

## 2014-12-11 ENCOUNTER — Ambulatory Visit (HOSPITAL_COMMUNITY)
Admission: RE | Admit: 2014-12-11 | Discharge: 2014-12-11 | Disposition: A | Discharge status: Home / Self Care | Payer: 59 | Source: Ambulatory Visit | Attending: Gynecology | Admitting: Gynecology

## 2014-12-11 DIAGNOSIS — N979 Female infertility, unspecified: Secondary | ICD-10-CM | POA: Insufficient documentation

## 2014-12-11 DIAGNOSIS — N970 Female infertility associated with anovulation: Secondary | ICD-10-CM

## 2014-12-11 MED ORDER — IOHEXOL 300 MG/ML  SOLN
30.0000 mL | Freq: Once | INTRAMUSCULAR | Status: DC | PRN
  Administered 2014-12-11: 30 mL
  Filled 2014-12-11: qty 30

## 2015-05-25 ENCOUNTER — Encounter: Payer: Self-pay | Admitting: Gynecology

## 2015-05-25 ENCOUNTER — Ambulatory Visit (INDEPENDENT_AMBULATORY_CARE_PROVIDER_SITE_OTHER): Payer: BLUE CROSS/BLUE SHIELD | Admitting: Gynecology

## 2015-05-25 VITALS — BP 114/72

## 2015-05-25 DIAGNOSIS — N912 Amenorrhea, unspecified: Secondary | ICD-10-CM

## 2015-05-25 DIAGNOSIS — Z3201 Encounter for pregnancy test, result positive: Secondary | ICD-10-CM

## 2015-05-25 DIAGNOSIS — Z3490 Encounter for supervision of normal pregnancy, unspecified, unspecified trimester: Secondary | ICD-10-CM

## 2015-05-25 LAB — PREGNANCY, URINE: Preg Test, Ur: POSITIVE — AB

## 2015-05-25 NOTE — Progress Notes (Signed)
   Patient is a 35 year old now gravida 1 para 0 who reports a last and so. 04/14/2015. Review of her record and indicated that in August 2016 she had been referred to the GYN oncologist as a result of a 12 cm left adnexal mass and Dr. Denman George had done a robotic left salpingo-oophorectomy with lysis of pelvic adhesion and had documented the following:  "retroperitoneal left ovarian mass measuring 12cm, replacing left ovary, densely adherent to sigmoid colon, left ovarian fossa and uterus and infiltrating into the retroperitoneum on the left causing adherence to the left ureter which required extensive ureterolysis to mobilize off the mass. The rectum was adherent to the posterior uterine lower segment and cervix and the right ovary was grossly normal with a corpus luteum. The right fallopian tube was slightly clubbed. Left ovarian cyst consistent with endometrioma with chocolate colored fluid."  Pathology report as follows:   Diagnosis Ovary and fallopian tube, left - FEATURES CONSISTENT WITH ENDOMETRIOMA/ENDOMETRIOTIC CYST WITH DEGENERATIVE CHANGES. - BENIGN FALLOPIAN TUBE WITH ENDOMETRIOSIS. - THERE IS NO EVIDENCE OF MALIGNANCY.  Patient is done well from her surgery and now would like to once again attempt to get pregnant. She states she has been with her same sexual partner for the past 6 years in which he has had a child with a previous partner. Patient has really been trying for the past 4 months. She reports her cycles were 28 days and now after her surgery there now every 24 days. Patient has not had any other form of pelvic surgery and denies any PID history in the past.We had an extensive discussion due to the fact that she will be 35 years of age this month. We discussed that fertility decreases after 35 years of age. Also with stage IV endometriosis there is a higher incidence of infertility associated with this disease. Also concerns that I had were the findings at time of her surgery that  the GYN oncologist described that her right fallopian tube distally appeared to be clubbed.  She had an HSG in October 2016 which demonstrated the following: FINDINGS: There is normal endometrial contour. There is opacification of the right fallopian tube and free intraperitoneal spill from the right fallopian tube. The left fallopian tube is not visualized, compatible with history of left salpingectomy.  IMPRESSION: Patent right fallopian tube.  Surgically absent left fallopian tube.  Patient had a home pregnancy test which was positive and today in the office urine pregnancy test was positive as well. She denies any nausea vomiting only some breast tenderness and some brownish discharge last week.  Exam: Pelvic: Bartholin urethra Skene was within normal limits Vagina: No lesions or discharge Cervix: No lesions or discharge Uterus: Anteverted 6-8 weeks size nontender Adnexa: No palpable masses or tenderness Rectal exam: Not done  Assessment/plan: 35 year old gravida 1 approximately 5-1/[redacted] weeks pregnant with past history of left salpingo-oophorectomy as a result of a large endometrioma and stage IV endometriosis conceived spontaneously. If her dates are correct she will be due on 01/20/2016. We had discussed her risk of an ectopic pregnancy because of the above-mentioned findings and for this reason we are going to get a quantitative beta-hCG in the next week with a follow-up ultrasound on April 20. She had been on prenatal vitamins that she was attempting to get pregnant. Literature information on first trimester pregnancy was provided.

## 2015-05-25 NOTE — Patient Instructions (Signed)
Primer trimestre de Media planner (First Trimester of Pregnancy) El primer trimestre de Media planner se extiende desde la semana1 hasta el final de la semana12 (mes1 al mes3). Una semana despus de que un espermatozoide fecunda un vulo, este se implantar en la pared uterina. Este embrin comenzar a Medical laboratory scientific officer convertirse en un beb. Sus genes y los de su pareja forman el beb. Los genes del varn determinan si ser un nio o una nia. Entre la semana6 y [place], se forman los ojos y [place], y los latidos del corazn pueden verse en la ecografa. Al final de las 12semanas, todos los rganos del beb estn formados.  Ahora que est embarazada, querr hacer todo lo que est a su alcance para tener un beb sano. Dos de las cosas ms importantes son Lucilla Edin buena atencin prenatal y seguir las indicaciones del mdico. La atencin prenatal incluye toda la asistencia mdica que usted recibe antes del nacimiento del beb. Esta ayudar a prevenir, Hydrographic surveyor y tratar cualquier problema durante el embarazo y [place]. CAMBIOS EN EL ORGANISMO Su organismo atraviesa por muchos cambios durante el [place], y estos varan de Ardelia Mems mujer a Theatre manager.   Al principio, puede aumentar o bajar algunos kilos.  Puede tener Higher education careers adviser (nuseas) y vomitar. Si no puede controlar los vmitos, llame al mdico.  Puede cansarse con facilidad.  Es posible que tenga dolores de cabeza que pueden aliviarse con los medicamentos que el mdico le permita tomar.  Puede orinar con mayor frecuencia. El dolor al orinar puede significar que usted tiene una infeccin de la vejiga.  Debido al Glennis Brink, puede tener acidez estomacal.  Puede estar estreida, ya que ciertas hormonas enlentecen los movimientos de los msculos que [employer] desechos a travs de los intestinos.  Pueden aparecer hemorroides o abultarse e hincharse las venas (venas varicosas).  Las [employer] pueden empezar a Engineer, site y Scientist, forensic. Los pezones  pueden sobresalir ms, y el tejido que los rodea (areola) tornarse ms oscuro.  Las Production manager y estar sensibles al cepillado y al hilo dental.  Pueden aparecer zonas oscuras o manchas (cloasma, mscara del Media planner) en el rostro que probablemente se atenuarn despus del nacimiento del beb.  Los perodos menstruales se interrumpirn.  Tal vez no tenga apetito.  Puede sentir un fuerte deseo de consumir ciertos alimentos.  Puede tener cambios a Engineer, site a da, por ejemplo, por momentos puede estar emocionada por el Media planner y por otros preocuparse porque algo pueda salir mal con el embarazo o el beb.  Tendr sueos ms vvidos y extraos.  Tal vez haya cambios en el cabello que pueden incluir su engrosamiento, crecimiento rpido y cambios en la textura. A algunas mujeres tambin se les cae el cabello durante o despus del [place], o tienen el cabello seco o fino. Lo ms probable es que el cabello se le normalice despus del nacimiento del beb. QU DEBE ESPERAR EN LAS CONSULTAS PRENATALES Durante una visita prenatal de rutina:  La pesarn para asegurarse de que usted y el beb estn creciendo normalmente.  Le controlarn la presin arterial.  Le medirn el abdomen para controlar el desarrollo del beb.  Se escucharn los latidos cardacos a partir de la semana10 o la12 de embarazo, aproximadamente.  Se analizarn los resultados de los estudios solicitados en visitas anteriores. El mdico puede preguntarle:  Cmo se siente.  Si siente los movimientos del beb.  Si ha tenido [employer], como prdida de lquido, [place], dolores de cabeza intensos o  médico puede preguntarle:  · Cómo se siente.  · Si siente los movimientos del bebé.  · Si ha tenido síntomas anormales, como pérdida de líquido, sangrado, dolores de cabeza intensos o cólicos abdominales.  · Si está consumiendo algún producto que contenga tabaco, como cigarrillos, tabaco de mascar y cigarrillos electrónicos.  · Si tiene alguna pregunta.  Otros estudios que pueden realizarse durante el primer trimestre incluyen lo siguiente:  · Análisis de sangre para determinar el tipo  de sangre y detectar la presencia de infecciones previas. Además, se los usará para controlar si los niveles de hierro son bajos (anemia) y determinar los anticuerpos Rh. En una etapa más avanzada del embarazo, se harán análisis de sangre para saber si tiene diabetes, junto con otros estudios si surgen problemas.  · Análisis de orina para detectar infecciones, diabetes o proteínas en la orina.  · Una ecografía para confirmar que el bebé crece y se desarrolla correctamente.  · Una amniocentesis para diagnosticar posibles problemas genéticos.  · Estudios del feto para descartar espina bífida y síndrome de Down.  · Es posible que necesite otras pruebas adicionales.  · Prueba del VIH (virus de inmunodeficiencia humana). Los exámenes prenatales de rutina incluyen la prueba de detección del VIH, a menos que decida no realizársela.  INSTRUCCIONES PARA EL CUIDADO EN EL HOGAR   Medicamentos:  · Siga las indicaciones del médico en relación con el uso de medicamentos. Durante el embarazo, hay medicamentos que pueden tomarse y otros que no.  · Tome las vitaminas prenatales como se le indicó.  · Si está estreñida, tome un laxante suave, si el médico lo autoriza.  Dieta  · Consuma alimentos balanceados. Elija alimentos variados, como carne o proteínas de origen vegetal, pescado, leche y productos lácteos descremados, verduras, frutas y panes y cereales integrales. El médico la ayudará a determinar la cantidad de peso que puede aumentar.  · No coma carne cruda ni quesos sin cocinar. Estos elementos contienen bacterias que pueden causar defectos congénitos en el bebé.  · La ingesta diaria de cuatro o cinco comidas pequeñas en lugar de tres comidas abundantes puede ayudar a aliviar las náuseas y los vómitos. Si empieza a tener náuseas, comer algunas galletas saladas puede ser de ayuda. Beber líquidos entre las comidas en lugar de tomarlos durante las comidas también puede ayudar a calmar las náuseas y los vómitos.  · Si está  estreñida, consuma alimentos con alto contenido de fibra, como verduras y frutas frescas, y cereales integrales. Beba suficiente líquido para mantener la orina clara o de color amarillo pálido.  Actividad y ejercicios  · Haga ejercicio solamente como se lo haya indicado el médico. El ejercicio la ayudará a:    Controlar el peso.    Mantenerse en forma.    Estar preparada para el trabajo de parto y el parto.  · Los dolores, los cólicos en la parte baja del abdomen o los calambres en la cintura son un buen indicio de que debe dejar de hacer ejercicios. Consulte al médico antes de seguir haciendo ejercicios normales.  · Intente no estar de pie durante mucho tiempo. Mueva las piernas con frecuencia si debe estar de pie en un lugar durante mucho tiempo.  · Evite levantar pesos excesivos.  · Use zapatos de tacones bajos y mantenga una buena postura.  · Puede seguir teniendo relaciones sexuales, excepto que el médico le indique lo contrario.  Alivio del dolor o las molestias  · Use un sostén que le brinde buen   soporte si siente dolor a la palpación en las mamas.  · Dese baños de asiento con agua tibia para aliviar el dolor o las molestias causadas por las hemorroides. Use crema antihemorroidal si el médico se lo permite.  · Descanse con las piernas elevadas si tiene calambres o dolor de cintura.  · Si tiene venas varicosas en las piernas, use medias de descanso. Eleve los pies durante 15 minutos, 3 o 4 veces por día. Limite la cantidad de sal en su dieta.  Cuidados prenatales  · Programe las visitas prenatales para la semana 12 de embarazo. Generalmente se programan cada mes al principio y se hacen más frecuentes en los 2 últimos meses antes del parto.  · Escriba sus preguntas. Llévelas cuando concurra a las visitas prenatales.  · Concurra a todas las visitas prenatales como se lo haya indicado el médico.  Seguridad  · Colóquese el cinturón de seguridad cuando conduzca.  · Haga una lista de los números de teléfono de  emergencia, que incluya los números de teléfono de familiares, amigos, el hospital y los departamentos de policía y bomberos.  Consejos generales  · Pídale al médico que la derive a clases de educación prenatal en su localidad. Debe comenzar a tomar las clases antes de entrar en el mes 6 de embarazo.  · Pida ayuda si tiene necesidades nutricionales o de asesoramiento durante el embarazo. El médico puede aconsejarla o derivarla a especialistas para que la ayuden con diferentes necesidades.  · No se dé baños de inmersión en agua caliente, baños turcos ni saunas.  · No se haga duchas vaginales ni use tampones o toallas higiénicas perfumadas.  · No mantenga las piernas cruzadas durante mucho tiempo.  · Evite el contacto con las bandejas sanitarias de los gatos y la tierra que estos animales usan. Estos elementos contienen bacterias que pueden causar defectos congénitos al bebé y la posible pérdida del feto debido a un aborto espontáneo o muerte fetal.  · No fume, no consuma hierbas ni medicamentos que no hayan sido recetados por el médico. Las sustancias químicas que estos productos contienen afectan la formación y el desarrollo del bebé.  · No consuma ningún producto que contenga tabaco, lo que incluye cigarrillos, tabaco de mascar y cigarrillos electrónicos. Si necesita ayuda para dejar de fumar, consulte al médico. Puede recibir asesoramiento y otro tipo de recursos para dejar de fumar.  · Programe una cita con el dentista. En su casa, lávese los dientes con un cepillo dental blando y pásese el hilo dental con suavidad.  SOLICITE ATENCIÓN MÉDICA SI:   · Tiene mareos.  · Siente cólicos leves, presión en la pelvis o dolor persistente en el abdomen.  · Tiene náuseas, vómitos o diarrea persistentes.  · Tiene secreción vaginal con mal olor.  · Siente dolor al orinar.  · Tiene el rostro, las manos, las piernas o los tobillos más hinchados.  SOLICITE ATENCIÓN MÉDICA DE INMEDIATO SI:   · Tiene fiebre.  · Tiene una pérdida de  líquido por la vagina.  · Tiene sangrado o pequeñas pérdidas vaginales.  · Siente dolor intenso o cólicos en el abdomen.  · Sube o baja de peso rápidamente.  · Vomita sangre de color rojo brillante o material que parezca granos de café.  · Ha estado expuesta a la rubéola y no ha sufrido la enfermedad.  · Ha estado expuesta a la quinta enfermedad o a la varicela.  · Tiene un dolor de cabeza intenso.  · Le falta el aire.  · Sufre

## 2015-05-26 LAB — HCG, QUANTITATIVE, PREGNANCY: HCG, BETA CHAIN, QUANT, S: 10200.3 m[IU]/mL — AB

## 2015-05-31 ENCOUNTER — Other Ambulatory Visit: Payer: BLUE CROSS/BLUE SHIELD

## 2015-05-31 DIAGNOSIS — Z3490 Encounter for supervision of normal pregnancy, unspecified, unspecified trimester: Secondary | ICD-10-CM

## 2015-05-31 LAB — HCG, QUANTITATIVE, PREGNANCY: HCG, BETA CHAIN, QUANT, S: 25475.4 m[IU]/mL — AB

## 2015-06-04 ENCOUNTER — Encounter: Payer: Self-pay | Admitting: Gynecology

## 2015-06-04 ENCOUNTER — Ambulatory Visit (INDEPENDENT_AMBULATORY_CARE_PROVIDER_SITE_OTHER): Payer: BLUE CROSS/BLUE SHIELD

## 2015-06-04 ENCOUNTER — Other Ambulatory Visit: Payer: Self-pay | Admitting: Gynecology

## 2015-06-04 ENCOUNTER — Ambulatory Visit (INDEPENDENT_AMBULATORY_CARE_PROVIDER_SITE_OTHER): Payer: BLUE CROSS/BLUE SHIELD | Admitting: Gynecology

## 2015-06-04 VITALS — BP 126/82

## 2015-06-04 DIAGNOSIS — Z331 Pregnant state, incidental: Secondary | ICD-10-CM | POA: Diagnosis not present

## 2015-06-04 DIAGNOSIS — D251 Intramural leiomyoma of uterus: Secondary | ICD-10-CM

## 2015-06-04 DIAGNOSIS — Z3491 Encounter for supervision of normal pregnancy, unspecified, first trimester: Secondary | ICD-10-CM

## 2015-06-04 DIAGNOSIS — Z3490 Encounter for supervision of normal pregnancy, unspecified, unspecified trimester: Secondary | ICD-10-CM

## 2015-06-04 DIAGNOSIS — O289 Unspecified abnormal findings on antenatal screening of mother: Secondary | ICD-10-CM

## 2015-06-04 DIAGNOSIS — O209 Hemorrhage in early pregnancy, unspecified: Secondary | ICD-10-CM

## 2015-06-04 DIAGNOSIS — O418X10 Other specified disorders of amniotic fluid and membranes, first trimester, not applicable or unspecified: Secondary | ICD-10-CM | POA: Insufficient documentation

## 2015-06-04 DIAGNOSIS — O43891 Other placental disorders, first trimester: Secondary | ICD-10-CM

## 2015-06-04 DIAGNOSIS — O468X1 Other antepartum hemorrhage, first trimester: Secondary | ICD-10-CM

## 2015-06-04 NOTE — Patient Instructions (Signed)
First Trimester of Pregnancy The first trimester of pregnancy is from week 1 until the end of week 12 (months 1 through 3). A week after a sperm fertilizes an egg, the egg will implant on the wall of the uterus. This embryo will begin to develop into a baby. Genes from you and your partner are forming the baby. The female genes determine whether the baby is a boy or a girl. At 6-8 weeks, the eyes and face are formed, and the heartbeat can be seen on ultrasound. At the end of 12 weeks, all the baby's organs are formed.  Now that you are pregnant, you will want to do everything you can to have a healthy baby. Two of the most important things are to get good prenatal care and to follow your health care provider's instructions. Prenatal care is all the medical care you receive before the baby's birth. This care will help prevent, find, and treat any problems during the pregnancy and childbirth. BODY CHANGES Your body goes through many changes during pregnancy. The changes vary from woman to woman.   You may gain or lose a couple of pounds at first.  You may feel sick to your stomach (nauseous) and throw up (vomit). If the vomiting is uncontrollable, call your health care provider.  You may tire easily.  You may develop headaches that can be relieved by medicines approved by your health care provider.  You may urinate more often. Painful urination may mean you have a bladder infection.  You may develop heartburn as a result of your pregnancy.  You may develop constipation because certain hormones are causing the muscles that push waste through your intestines to slow down.  You may develop hemorrhoids or swollen, bulging veins (varicose veins).  Your breasts may begin to grow larger and become tender. Your nipples may stick out more, and the tissue that surrounds them (areola) may become darker.  Your gums may bleed and may be sensitive to brushing and flossing.  Dark spots or blotches (chloasma,  mask of pregnancy) may develop on your face. This will likely fade after the baby is born.  Your menstrual periods will stop.  You may have a loss of appetite.  You may develop cravings for certain kinds of food.  You may have changes in your emotions from day to day, such as being excited to be pregnant or being concerned that something may go wrong with the pregnancy and baby.  You may have more vivid and strange dreams.  You may have changes in your hair. These can include thickening of your hair, rapid growth, and changes in texture. Some women also have hair loss during or after pregnancy, or hair that feels dry or thin. Your hair will most likely return to normal after your baby is born. WHAT TO EXPECT AT YOUR PRENATAL VISITS During a routine prenatal visit:  You will be weighed to make sure you and the baby are growing normally.  Your blood pressure will be taken.  Your abdomen will be measured to track your baby's growth.  The fetal heartbeat will be listened to starting around week 10 or 12 of your pregnancy.  Test results from any previous visits will be discussed. Your health care provider may ask you:  How you are feeling.  If you are feeling the baby move.  If you have had any abnormal symptoms, such as leaking fluid, bleeding, severe headaches, or abdominal cramping.  If you are using any tobacco products,   including cigarettes, chewing tobacco, and electronic cigarettes.  If you have any questions. Other tests that may be performed during your first trimester include:  Blood tests to find your blood type and to check for the presence of any previous infections. They will also be used to check for low iron levels (anemia) and Rh antibodies. Later in the pregnancy, blood tests for diabetes will be done along with other tests if problems develop.  Urine tests to check for infections, diabetes, or protein in the urine.  An ultrasound to confirm the proper growth  and development of the baby.  An amniocentesis to check for possible genetic problems.  Fetal screens for spina bifida and Down syndrome.  You may need other tests to make sure you and the baby are doing well.  HIV (human immunodeficiency virus) testing. Routine prenatal testing includes screening for HIV, unless you choose not to have this test. HOME CARE INSTRUCTIONS  Medicines  Follow your health care provider's instructions regarding medicine use. Specific medicines may be either safe or unsafe to take during pregnancy.  Take your prenatal vitamins as directed.  If you develop constipation, try taking a stool softener if your health care provider approves. Diet  Eat regular, well-balanced meals. Choose a variety of foods, such as meat or vegetable-based protein, fish, milk and low-fat dairy products, vegetables, fruits, and whole grain breads and cereals. Your health care provider will help you determine the amount of weight gain that is right for you.  Avoid raw meat and uncooked cheese. These carry germs that can cause birth defects in the baby.  Eating four or five small meals rather than three large meals a day may help relieve nausea and vomiting. If you start to feel nauseous, eating a few soda crackers can be helpful. Drinking liquids between meals instead of during meals also seems to help nausea and vomiting.  If you develop constipation, eat more high-fiber foods, such as fresh vegetables or fruit and whole grains. Drink enough fluids to keep your urine clear or pale yellow. Activity and Exercise  Exercise only as directed by your health care provider. Exercising will help you:  Control your weight.  Stay in shape.  Be prepared for labor and delivery.  Experiencing pain or cramping in the lower abdomen or low back is a good sign that you should stop exercising. Check with your health care provider before continuing normal exercises.  Try to avoid standing for long  periods of time. Move your legs often if you must stand in one place for a long time.  Avoid heavy lifting.  Wear low-heeled shoes, and practice good posture.  You may continue to have sex unless your health care provider directs you otherwise. Relief of Pain or Discomfort  Wear a good support bra for breast tenderness.   Take warm sitz baths to soothe any pain or discomfort caused by hemorrhoids. Use hemorrhoid cream if your health care provider approves.   Rest with your legs elevated if you have leg cramps or low back pain.  If you develop varicose veins in your legs, wear support hose. Elevate your feet for 15 minutes, 3-4 times a day. Limit salt in your diet. Prenatal Care  Schedule your prenatal visits by the twelfth week of pregnancy. They are usually scheduled monthly at first, then more often in the last 2 months before delivery.  Write down your questions. Take them to your prenatal visits.  Keep all your prenatal visits as directed by your   health care provider. Safety  Wear your seat belt at all times when driving.  Make a list of emergency phone numbers, including numbers for family, friends, the hospital, and police and fire departments. General Tips  Ask your health care provider for a referral to a local prenatal education class. Begin classes no later than at the beginning of month 6 of your pregnancy.  Ask for help if you have counseling or nutritional needs during pregnancy. Your health care provider can offer advice or refer you to specialists for help with various needs.  Do not use hot tubs, steam rooms, or saunas.  Do not douche or use tampons or scented sanitary pads.  Do not cross your legs for long periods of time.  Avoid cat litter boxes and soil used by cats. These carry germs that can cause birth defects in the baby and possibly loss of the fetus by miscarriage or stillbirth.  Avoid all smoking, herbs, alcohol, and medicines not prescribed by  your health care provider. Chemicals in these affect the formation and growth of the baby.  Do not use any tobacco products, including cigarettes, chewing tobacco, and electronic cigarettes. If you need help quitting, ask your health care provider. You may receive counseling support and other resources to help you quit.  Schedule a dentist appointment. At home, brush your teeth with a soft toothbrush and be gentle when you floss. SEEK MEDICAL CARE IF:   You have dizziness.  You have mild pelvic cramps, pelvic pressure, or nagging pain in the abdominal area.  You have persistent nausea, vomiting, or diarrhea.  You have a bad smelling vaginal discharge.  You have pain with urination.  You notice increased swelling in your face, hands, legs, or ankles. SEEK IMMEDIATE MEDICAL CARE IF:   You have a fever.  You are leaking fluid from your vagina.  You have spotting or bleeding from your vagina.  You have severe abdominal cramping or pain.  You have rapid weight gain or loss.  You vomit blood or material that looks like coffee grounds.  You are exposed to German measles and have never had them.  You are exposed to fifth disease or chickenpox.  You develop a severe headache.  You have shortness of breath.  You have any kind of trauma, such as from a fall or a car accident.   This information is not intended to replace advice given to you by your health care provider. Make sure you discuss any questions you have with your health care provider.   Document Released: 01/24/2001 Document Revised: 02/20/2014 Document Reviewed: 12/10/2012 [employer] Interactive Patient Education 2016 [employer]  

## 2015-06-04 NOTE — Progress Notes (Signed)
    Patient's a 35 year old now gravida 1 para 0 who was seen the office on April 11 as a result of being amenorrheic and was found to have a positive pregnancy test. Review of her record indicated that August 2016 she had been referred to the GYN oncologist as a result of a 12 cm left adnexal mass and Dr. Denman George had done a robotic left salpingo-oophorectomy with lysis of pelvic adhesion and had documented the following:  "retroperitoneal left ovarian mass measuring 12cm, replacing left ovary, densely adherent to sigmoid colon, left ovarian fossa and uterus and infiltrating into the retroperitoneum on the left causing adherence to the left ureter which required extensive ureterolysis to mobilize off the mass. The rectum was adherent to the posterior uterine lower segment and cervix and the right ovary was grossly normal with a corpus luteum. The right fallopian tube was slightly clubbed. Left ovarian cyst consistent with endometrioma with chocolate colored fluid."  Pathology report as follows:   Diagnosis Ovary and fallopian tube, left - FEATURES CONSISTENT WITH ENDOMETRIOMA/ENDOMETRIOTIC CYST WITH DEGENERATIVE CHANGES. - BENIGN FALLOPIAN TUBE WITH ENDOMETRIOSIS. - THERE IS NO EVIDENCE OF MALIGNANCY.  Patient did well from her surgery and for many year she didn't attempted to get pregnant and spontaneously conceived. She had a quantitative beta-hCG in the last office visit which was 10,200.3 milli-international units per mL and on repeat on 05/31/2015 it had risen to 25,475.4. Patient has slight brownish discharge last week but no active bleeding reported now. She is currently taking her prenatal vitamins.  Patient reported that the first day of her last menstrual period was March 1 which would place her today at 7 weeks and 2 days with a due date of 01/19/2016. Based on today's ultrasound she is 6 weeks and 5 days with a due date 01/23/2016.  Ultrasound today demonstrated a fibroid uterus with 2  small fibroids 14 x 12, 12 x 19 mm. Gestational sac was seen the fundus of the uterus with fetal pole seen consistent with 6 weeks and one day. No cardiac activity was able to be identified today. Gestational sac was consistent with 7 weeks and 2 days. A small subchorionic hematoma anterior wall of the gestational sac with a measurement of 9 x 7 x 3 mm was noted. A small right corpus luteum cyst measuring 20 x 16 mm was noted. Cervix long closed left adnexa was normal and no fluid in the cul-de-sac.  Assessment/plan: First trimester pregnancy was small subchorionic hematoma size less than dates 6 weeks and 5 days fetal pole noted cardiac activity not seen as of yet. Threatened AB precautions were discussed with the patient. We will repeat her ultrasound in 2 weeks.

## 2015-06-17 ENCOUNTER — Ambulatory Visit (INDEPENDENT_AMBULATORY_CARE_PROVIDER_SITE_OTHER): Payer: BLUE CROSS/BLUE SHIELD

## 2015-06-17 ENCOUNTER — Other Ambulatory Visit: Payer: Self-pay | Admitting: *Deleted

## 2015-06-17 ENCOUNTER — Other Ambulatory Visit: Payer: Self-pay | Admitting: Gynecology

## 2015-06-17 ENCOUNTER — Encounter: Payer: Self-pay | Admitting: Gynecology

## 2015-06-17 ENCOUNTER — Ambulatory Visit (INDEPENDENT_AMBULATORY_CARE_PROVIDER_SITE_OTHER): Payer: BLUE CROSS/BLUE SHIELD | Admitting: Gynecology

## 2015-06-17 VITALS — BP 138/84

## 2015-06-17 DIAGNOSIS — O021 Missed abortion: Secondary | ICD-10-CM

## 2015-06-17 DIAGNOSIS — O468X1 Other antepartum hemorrhage, first trimester: Secondary | ICD-10-CM

## 2015-06-17 DIAGNOSIS — O200 Threatened abortion: Secondary | ICD-10-CM | POA: Diagnosis not present

## 2015-06-17 DIAGNOSIS — O418X10 Other specified disorders of amniotic fluid and membranes, first trimester, not applicable or unspecified: Secondary | ICD-10-CM

## 2015-06-17 DIAGNOSIS — O0289 Other abnormal products of conception: Secondary | ICD-10-CM

## 2015-06-17 DIAGNOSIS — O43891 Other placental disorders, first trimester: Secondary | ICD-10-CM | POA: Diagnosis not present

## 2015-06-17 DIAGNOSIS — Z3491 Encounter for supervision of normal pregnancy, unspecified, first trimester: Secondary | ICD-10-CM

## 2015-06-17 NOTE — Patient Instructions (Signed)

## 2015-06-17 NOTE — Addendum Note (Signed)
Addended by: Thurnell Garbe A on: 06/17/2015 02:05 PM   Modules accepted: Orders

## 2015-06-17 NOTE — Progress Notes (Signed)
   Patient is a 35 year old gravida 1 para 0 was seen in the office on April 21 as a result of being amenorrheic and a positive home urine pregnancy test.the first day of her last menstrual period was March 1 which would place her today at 7 weeks and 2 days with a due date of 01/19/2016. Based on  ultrasound 06/04/2015 she is 6 weeks and 5 days with a due date 01/23/2016. Patient denies any nausea, vomiting. No GU or GI complaints, no cramping or vaginal bleeding  Ultrasound today demonstrated a fibroid uterus with 2 small fibroids 14 x 12, 12 x 19 mm. Gestational sac was seen the fundus of the uterus with fetal pole seen consistent with 6 weeks and one day. No cardiac activity was able to be identified today. Gestational sac was consistent with 7 weeks and 2 days. A small subchorionic hematoma anterior wall of the gestational sac with a measurement of 9 x 7 x 3 mm was noted. A small right corpus luteum cyst measuring 20 x 16 mm was noted. Cervix long closed left adnexa was normal and no fluid in the cul-de-sac.  She reports no vaginal bleeding no abdominal cramping. Her quantitative beta-hCGs have been as follows:  Results for Marie Hill, Marie Hill (MRN [id number]) as of 06/17/2015 13:27  Ref. Range 05/25/2015 16:07 05/31/2015 11:24  HCG, Beta Chain, Quant, S Latest Units: mIU/mL 10200.3 (H) 25475.4 (H)   Ultrasound today: Based on ultrasound she is 6 weeks today with a due date of 02/10/2016. The base on her last menstrual period of 04/14/2015 she would be 9 weeks and one day with her corrected due date of 01/19/2016. A gestational sac was seen in the fundus of the uterus along with a fetal pole which on crown-rump length measurement was 6 weeks but no cardiac activity was noted. No yolk sac was seen yet. Doppler was negative for any fetal heart movement. She did have a normal small right corpus luteum cyst no fluid in the cul-de-sac. The cervix is long closed no growth or change in ultrasound from 06/04/2015. No  evidence of subchorionic hematoma seen today with compare with last scan.  Assessment/plan: Patient with apparent first trimester missed AB although no cramping or vaginal bleeding and complete resolution of subchorionic hematoma along with a rising quantitative beta-hCG makes [place] want to wait another week or 2 and repeat the scan. We are going to repeat a quantitative beta-hCG today. I've told the patient there is an 85% probability that she's had a miscarriage but if her quantitative beta-hCG drawn today shows it is leveling off and dropping off that she does have a miscarriage and we will need to consider either doing a D&E or having or passage spontaneously. On the other hand the quantitative beta-hCGs rising will repeat the ultrasound in 2 weeks. If she begins cramping or bleeding she'll report to the office or after-hours to the emergency room.  Greater than 50% of the time was spent discussing and comparing previous ultrasounds with this patient who appears to have a first trimester miscarriage. Time of consultation 15 minutes

## 2015-06-18 LAB — HCG, QUANTITATIVE, PREGNANCY: hCG, Beta Chain, Quant, S: 31606.7 m[IU]/mL — ABNORMAL HIGH

## 2015-06-24 ENCOUNTER — Telehealth: Payer: Self-pay | Admitting: *Deleted

## 2015-06-24 DIAGNOSIS — O200 Threatened abortion: Secondary | ICD-10-CM

## 2015-06-24 NOTE — Telephone Encounter (Signed)
Please inform patient her quantitative beta-hCG has conference 10,200 to 25,475 and had just received the results from May 4 that it went up to 31,606. I would recommend follow-up ultrasound and office visit next week

## 2015-06-24 NOTE — Telephone Encounter (Signed)
Patient Quant, HCG result were not epic for some reason, paper results will be placed in your in basket to review. Patient would like to know what to do next.  Please advise

## 2015-06-24 NOTE — Telephone Encounter (Signed)
Pt informed with the below note, transferred to front desk.  

## 2015-06-28 ENCOUNTER — Ambulatory Visit (INDEPENDENT_AMBULATORY_CARE_PROVIDER_SITE_OTHER): Payer: BLUE CROSS/BLUE SHIELD

## 2015-06-28 ENCOUNTER — Other Ambulatory Visit: Payer: Self-pay | Admitting: Gynecology

## 2015-06-28 ENCOUNTER — Encounter: Payer: Self-pay | Admitting: Gynecology

## 2015-06-28 ENCOUNTER — Telehealth: Payer: Self-pay | Admitting: *Deleted

## 2015-06-28 ENCOUNTER — Ambulatory Visit (INDEPENDENT_AMBULATORY_CARE_PROVIDER_SITE_OTHER): Payer: BLUE CROSS/BLUE SHIELD | Admitting: Gynecology

## 2015-06-28 VITALS — BP 128/76

## 2015-06-28 DIAGNOSIS — O021 Missed abortion: Secondary | ICD-10-CM

## 2015-06-28 DIAGNOSIS — O200 Threatened abortion: Secondary | ICD-10-CM | POA: Diagnosis not present

## 2015-06-28 LAB — HCG, TOTAL, QUANTITATIVE: hCG, Beta Chain, Quant, S: 16500 m[IU]/mL — ABNORMAL HIGH

## 2015-06-28 NOTE — Telephone Encounter (Signed)
Pt is pregnant c/o heavy bleeding this am, spotting only yesterday and today heavy period flow, pt transferred to appointment desk to see provider

## 2015-06-28 NOTE — Progress Notes (Addendum)
   Patient is a 35 year old gravida 1 para 0 who was seen the office on April 21 as a result of being amenorrheic and a positive urine pregnancy test at home. She was also seen in the office on May 4 summary of her history as follows:  Patient reports last menstrual period March 1 which would place her at 10-1/[redacted] weeks gestation with a tentative due date of 01/19/2016. Patient presented to the office today stating she was spotting yesterday and heavier bleeding today although dark not bright red with minimal cramping.  Patient had an ultrasound done in the office on May 4 that demonstrated the following: "Based on ultrasound she is 6 weeks today with a due date of 02/10/2016. The base on her last menstrual period of 04/14/2015 she would be 9 weeks and one day with her corrected due date of 01/19/2016. A gestational sac was seen in the fundus of the uterus along with a fetal pole which on crown-rump length measurement was 6 weeks but no cardiac activity was noted. No yolk sac was seen yet. Doppler was negative for any fetal heart movement. She did have a normal small right corpus luteum cyst no fluid in the cul-de-sac. The cervix is long closed no growth or change in ultrasound from 06/04/2015. No evidence of subchorionic hematoma seen today with compare with last scan."  She had quantitative beta-hCGs as follows: Results for CHANYAH, ISAAK (MRN [id number]) as of 06/17/2015 13:27  Ref. Range 05/25/2015 16:07 05/31/2015 11:24  HCG, Beta Chain, Quant, S Latest Units: mIU/mL 10200.3 (H) 25475.4 (H)         Exam today: Abdomen: Soft nontender no rebound or guarding Pelvic: Bartholin urethra Skene was within normal limits Vagina dark old blood present in the vaginal vault Cervix: No active bleeding Uterus: Uterus partially 10 weeks size nontender Adnexa: No palpable mass or tenderness Rectal exam not done  A quantitative beta-hCG was drawn today results will be available later this  afternoon.  Ultrasound today:Gestational sac was seen in the fundus no change from previous ultrasound. Crown-rump length 5 weeks and 6 days, fetal pole but no cardiac activity was noted. Cervix is long closed gestational sacs run right vascular flow 20 x 7 mm. Right ovarian corpus luteum cyst. Left ovary absent due to past history of left salpingo-oophorectomy. Left adnexa negative  Assessment/plan: Clinical evidence of missed AB quantitative beta-hCG pending this afternoon. Patient has been would like to wait one more week to see if she passes on her own and then return for an ultrasound to make sure that she has completely evacuate her cavity. If not she will return back to the office or call for that we can schedule a D&E. We are going to check also today her blood type and Rh.  Addendum: Later in the afternoon the results from her blood type and Rh and quantitative beta hCG returned as follows: Blood type B positive Quantitative beta-hCG had decreased to a level of 16,500 mL international units per mL and this information was related to the patient.

## 2015-06-29 LAB — ABO AND RH: RH TYPE: POSITIVE

## 2015-06-30 ENCOUNTER — Ambulatory Visit: Payer: BLUE CROSS/BLUE SHIELD | Admitting: Gynecology

## 2015-06-30 ENCOUNTER — Other Ambulatory Visit: Payer: Self-pay | Admitting: Gynecology

## 2015-06-30 ENCOUNTER — Other Ambulatory Visit: Payer: BLUE CROSS/BLUE SHIELD

## 2015-06-30 ENCOUNTER — Telehealth: Payer: Self-pay | Admitting: *Deleted

## 2015-06-30 ENCOUNTER — Encounter: Payer: Self-pay | Admitting: Gynecology

## 2015-06-30 ENCOUNTER — Ambulatory Visit (INDEPENDENT_AMBULATORY_CARE_PROVIDER_SITE_OTHER): Payer: BLUE CROSS/BLUE SHIELD

## 2015-06-30 ENCOUNTER — Ambulatory Visit (INDEPENDENT_AMBULATORY_CARE_PROVIDER_SITE_OTHER): Payer: BLUE CROSS/BLUE SHIELD | Admitting: Gynecology

## 2015-06-30 VITALS — BP 126/78

## 2015-06-30 DIAGNOSIS — O200 Threatened abortion: Secondary | ICD-10-CM

## 2015-06-30 DIAGNOSIS — O021 Missed abortion: Secondary | ICD-10-CM

## 2015-06-30 DIAGNOSIS — O039 Complete or unspecified spontaneous abortion without complication: Secondary | ICD-10-CM | POA: Insufficient documentation

## 2015-06-30 NOTE — Telephone Encounter (Signed)
-----   Message from Terrance Mass, MD sent at 06/30/2015  8:54 AM EDT ----- It appears on ultrasound availability. She will need an ultrasound not just an office visit ----- Message -----    From: Thamas Jaegers, RMA    Sent: 06/30/2015   8:33 AM      To: Terrance Mass, MD  Dr.Fernandez   This patient is misses ab, told to schedule OV with you once she passed on her own. Pt has done this and scheduled today at 3:00pm. Is this time okay or would you like to work her in this am?

## 2015-06-30 NOTE — Telephone Encounter (Signed)
Claudia informed with the below.

## 2015-06-30 NOTE — Progress Notes (Addendum)
   Patient is a 35 year old gravida 1 para 0 who was seen in the office on May 15 whereby her quantitative beta-hCGs were reviewed as well as her ultrasound which indicated that she was having a miscarriage and the note size follows:  "Patient was originally seen April 21 as a result of being amenorrheic and a positive urine pregnancy test at home. She was also seen in the office on May 4 summary of her history as follows:  Patient reports last menstrual period March 1 which would place her at 10-1/[redacted] weeks gestation with a tentative due date of 01/19/2016. Patient presented to the office today stating she was spotting yesterday and heavier bleeding today although dark not bright red with minimal cramping.  Patient had an ultrasound done in the office on May 4 that demonstrated the following:  "Based on ultrasound she is 6 weeks today with a due date of 02/10/2016. The base on her last menstrual period of 04/14/2015 she would be 9 weeks and one day with her corrected due date of 01/19/2016. A gestational sac was seen in the fundus of the uterus along with a fetal pole which on crown-rump length measurement was 6 weeks but no cardiac activity was noted. No yolk sac was seen yet. Doppler was negative for any fetal heart movement. She did have a normal small right corpus luteum cyst no fluid in the cul-de-sac. The cervix is long closed no growth or change in ultrasound from 06/04/2015. No evidence of subchorionic hematoma seen today with compare with last scan."  Her quantitative beta-hCGs have been as follows: Results for CUMI, HAGEMANN (MRN [id number]) as of 06/30/2015 15:38  Ref. Range 05/25/2015 16:07 05/31/2015 11:24 06/28/2015 12:08  HCG, Beta Chain, Quant, S Latest Units: mIU/mL 10200.3 (H) 25475.4 (H) 16500.0 (H)   Her blood type is B+  Follow-up ultrasound 06/28/2015 demonstrated the following: Gestational sac was seen in the fundus no change from previous ultrasound. Crown-rump length 5 weeks and 6  days, fetal pole but no cardiac activity was noted. Cervix is long closed gestational sacs run right vascular flow 20 x 7 mm. Right ovarian corpus luteum cyst. Left ovary absent due to past history of left salpingo-oophorectomy. Left adnexa negative  At the last office visit patient had been given the option to proceed with a D&E as an outpatient procedure versus to see if she was passed the fetus spontaneously and she opted for the latter. She stated that last Monday p.m. she thought she passed the tissue and was here for an ultrasound to see if she spontaneously cleared her uterus. She is not actively bleeding right now.  Ultrasound: Based on last menstrual period of March 1 patient would be 11 weeks but by ultrasound intrauterine gestational sac was consistent with 7 weeks and 3 days and crown-rump length measurement was 5 weeks and 5 days. No cardiac activity. No change in ultrasound from previous study  The above ultrasound were discussed with the patient she has decided she would like to wait one more week to see if she passes a completely on her own if not she'll contact the office for follow-up ultrasound if she does to make sure she completely evacuates the cavity. If not we will going to schedule a D&E for next week.  Greater than 50% time was spent counseling [place] care for this patient with a first trimester miscarriage time of consultation 50 minutes

## 2015-07-02 ENCOUNTER — Other Ambulatory Visit: Payer: Self-pay | Admitting: Gynecology

## 2015-07-02 ENCOUNTER — Telehealth: Payer: Self-pay | Admitting: *Deleted

## 2015-07-02 DIAGNOSIS — O200 Threatened abortion: Secondary | ICD-10-CM

## 2015-07-02 NOTE — Telephone Encounter (Signed)
Pt called stating she has started bleeding again with small amounts of tissue passing asked if this normal process with miscarriage. I explained she will have bleeding throughout this process, per JF note on OV 06/30/15 "Patient has been would like to wait one more week to see if she passes on her own and then return for an ultrasound to make sure that she has completely evacuate her cavity. If not she will return back to the office or call for that we can schedule a D&E.   Pt informed with this and transferred to front desk to schedule ultrasound on Monday 07/05/15 @ 8:30am to confirmed she has passed everything fully.

## 2015-07-05 ENCOUNTER — Encounter: Payer: Self-pay | Admitting: Gynecology

## 2015-07-05 ENCOUNTER — Ambulatory Visit (INDEPENDENT_AMBULATORY_CARE_PROVIDER_SITE_OTHER): Payer: BLUE CROSS/BLUE SHIELD

## 2015-07-05 ENCOUNTER — Ambulatory Visit (INDEPENDENT_AMBULATORY_CARE_PROVIDER_SITE_OTHER): Payer: BLUE CROSS/BLUE SHIELD | Admitting: Gynecology

## 2015-07-05 ENCOUNTER — Other Ambulatory Visit: Payer: Self-pay | Admitting: Gynecology

## 2015-07-05 VITALS — BP 118/76

## 2015-07-05 DIAGNOSIS — O039 Complete or unspecified spontaneous abortion without complication: Secondary | ICD-10-CM | POA: Diagnosis not present

## 2015-07-05 DIAGNOSIS — O200 Threatened abortion: Secondary | ICD-10-CM | POA: Diagnosis not present

## 2015-07-05 NOTE — Patient Instructions (Signed)
Miscarriage  A miscarriage is the sudden loss of an unborn baby (fetus) before the 20th week of pregnancy. Most miscarriages happen in the first 3 months of pregnancy. Sometimes, it happens before a woman even knows she is pregnant. A miscarriage is also called a "spontaneous miscarriage" or "early pregnancy loss." Having a miscarriage can be an emotional experience. Talk with your caregiver about any questions you may have about miscarrying, the grieving process, and your future pregnancy plans.  CAUSES    Problems with the fetal chromosomes that make it impossible for the baby to develop normally. Problems with the baby's genes or chromosomes are most often the result of errors that occur, by chance, as the embryo divides and grows. The problems are not inherited from the parents.   Infection of the cervix or uterus.    Hormone problems.    Problems with the cervix, such as having an incompetent cervix. This is when the tissue in the cervix is not strong enough to hold the pregnancy.    Problems with the uterus, such as an abnormally shaped uterus, uterine fibroids, or congenital abnormalities.    Certain medical conditions.    Smoking, drinking alcohol, or taking illegal drugs.    Trauma.   Often, the cause of a miscarriage is unknown.   SYMPTOMS    Vaginal bleeding or spotting, with or without cramps or pain.   Pain or cramping in the abdomen or lower back.   Passing fluid, tissue, or blood clots from the vagina.  DIAGNOSIS   Your caregiver will perform a physical exam. You may also have an ultrasound to confirm the miscarriage. Blood or urine tests may also be ordered.  TREATMENT    Sometimes, treatment is not necessary if you naturally pass all the fetal tissue that was in the uterus. If some of the fetus or placenta remains in the body (incomplete miscarriage), tissue left behind may become infected and must be removed. Usually, a dilation and curettage (D and C) procedure is performed.  During a D and C procedure, the cervix is widened (dilated) and any remaining fetal or placental tissue is gently removed from the uterus.   Antibiotic medicines are prescribed if there is an infection. Other medicines may be given to reduce the size of the uterus (contract) if there is a lot of bleeding.   If you have Rh negative blood and your baby was Rh positive, you will need a Rh immunoglobulin shot. This shot will protect any future baby from having Rh blood problems in future pregnancies.  HOME CARE INSTRUCTIONS    Your caregiver may order bed rest or may allow you to continue light activity. Resume activity as directed by your caregiver.   Have someone help with home and family responsibilities during this time.    Keep track of the number of sanitary pads you use each day and how soaked (saturated) they are. Write down this information.    Do not use tampons. Do not douche or have sexual intercourse until approved by your caregiver.    Only take over-the-counter or prescription medicines for pain or discomfort as directed by your caregiver.    Do not take aspirin. Aspirin can cause bleeding.    Keep all follow-up appointments with your caregiver.    If you or your partner have problems with grieving, talk to your caregiver or seek counseling to help cope with the pregnancy loss. Allow enough time to grieve before trying to get pregnant again.     SEEK IMMEDIATE MEDICAL CARE IF:    You have severe cramps or pain in your back or abdomen.   You have a fever.   You pass large blood clots (walnut-sized or larger) ortissue from your vagina. Save any tissue for your caregiver to inspect.    Your bleeding increases.    You have a thick, bad-smelling vaginal discharge.   You become lightheaded, weak, or you faint.    You have chills.   MAKE SURE YOU:   Understand these instructions.   Will watch your condition.   Will get help right away if you are not doing well or get worse.     This  information is not intended to replace advice given to you by your health care provider. Make sure you discuss any questions you have with your health care provider.     Document Released: 07/26/2000 Document Revised: 05/27/2012 Document Reviewed: 03/21/2011  Elsevier Interactive Patient Education 2016 [employer]

## 2015-07-05 NOTE — Progress Notes (Signed)
   Patient is a 35 year old gravida 1 para 0 who presented to the office today for an ultrasound. Patient has been monitor for threatened AB and quantitative beta-hCGs. Patient stated approximately 4 days ago she passed a large amount of clotted tissue and had some bleeding on and off for the past few days which has decreased. She is asymptomatic today otherwise. Her ultrasound and previous note report as follows:  Patient was initially seen the office on May 15 of this year  whereby her quantitative beta-hCGs were reviewed as well as her ultrasound which indicated that she was having a miscarriage and the note size follows:  "Patient was originally seen April 21 as a result of being amenorrheic and a positive urine pregnancy test at home. She was also seen in the office on May 4 summary of her history as follows:  Patient reports last menstrual period March 1 which would place her at 10-1/[redacted] weeks gestation with a tentative due date of 01/19/2016. Patient presented to the office today stating she was spotting yesterday and heavier bleeding today although dark not bright red with minimal cramping.  Patient had an ultrasound done in the office on May 4 that demonstrated the following:  "Based on ultrasound she is 6 weeks today with a due date of 02/10/2016. The base on her last menstrual period of 04/14/2015 she would be 9 weeks and one day with her corrected due date of 01/19/2016. A gestational sac was seen in the fundus of the uterus along with a fetal pole which on crown-rump length measurement was 6 weeks but no cardiac activity was noted. No yolk sac was seen yet. Doppler was negative for any fetal heart movement. She did have a normal small right corpus luteum cyst no fluid in the cul-de-sac. The cervix is long closed no growth or change in ultrasound from 06/04/2015. No evidence of subchorionic hematoma seen today with compare with last scan."  Her quantitative beta-hCGs have been as  follows: Results for XOPHIA, ANGELI (MRN [id number]) as of 06/30/2015 15:38  Ref. Range 05/25/2015 16:07 05/31/2015 11:24 06/28/2015 12:08  HCG, Beta Chain, Quant, S Latest Units: mIU/mL 10200.3 (H) 25475.4 (H) 16500.0 (H)   Her blood type is B+  Follow-up ultrasound 06/28/2015 demonstrated the following: Gestational sac was seen in the fundus no change from previous ultrasound. Crown-rump length 5 weeks and 6 days, fetal pole but no cardiac activity was noted. Cervix is long closed gestational sacs run right vascular flow 20 x 7 mm. Right ovarian corpus luteum cyst. Left ovary absent due to past history of left salpingo-oophorectomy. Left adnexa negative       Her ultrasound today demonstrated the following: Retroverted uterus no change in previous fibroid seen. No evidence of prior gestational sac or fetal pole seen. Cervix prominent. Right ovary normal. Left adnexa normal. Negative color flow density. Vascular flow to prominent endometrium unable to identify any positive retained products of conception. Endometrium appears echogenic 16.5 mm.  Assessment/plan: 35 year old gravida 1 para 1 with apparent spontaneous miscarriage blood type B positive. We'll obtain a quantitative beta-hCG today and repeat in 1 week to make sure continues to decrease down to 0. Patient with minimal spotting today reassured. She'll use barrier contraception for the next 2 months and continue prenatal vitamins before attempting to get pregnant again.

## 2015-07-06 ENCOUNTER — Other Ambulatory Visit: Payer: Self-pay | Admitting: Gynecology

## 2015-07-06 DIAGNOSIS — O039 Complete or unspecified spontaneous abortion without complication: Secondary | ICD-10-CM

## 2015-07-06 LAB — HCG, QUANTITATIVE, PREGNANCY: hCG, Beta Chain, Quant, S: 376.1 m[IU]/mL — ABNORMAL HIGH

## 2015-07-13 ENCOUNTER — Other Ambulatory Visit: Payer: BLUE CROSS/BLUE SHIELD

## 2015-07-13 DIAGNOSIS — O039 Complete or unspecified spontaneous abortion without complication: Secondary | ICD-10-CM

## 2015-07-13 LAB — HCG, QUANTITATIVE, PREGNANCY: hCG, Beta Chain, Quant, S: 27.7 m[IU]/mL — ABNORMAL HIGH

## 2016-01-31 IMAGING — CT CT ABD-PELV W/ CM
2 of 4 series · 16 of 46 positions shown, 18 images · IV contrast (OMNIPAQUE)
Comparison: Ultrasound exam from 09/02/2014.

CLINICAL DATA: Subsequent encounter for 12 cm adnexal mass.

EXAM:
CT ABDOMEN AND PELVIS WITH CONTRAST
TECHNIQUE: Multidetector CT imaging of the abdomen and pelvis was performed
using the standard protocol following bolus administration of
intravenous contrast.
CONTRAST:  100mL OMNIPAQUE IOHEXOL 300 MG/ML  SOLN

[Series 2: rtn a/p with · axial · 0.65mm/px · z∈[-528,-178]mm · 13 of 78 slices shown, 15 images]
[im 4/78  soft-tissue]
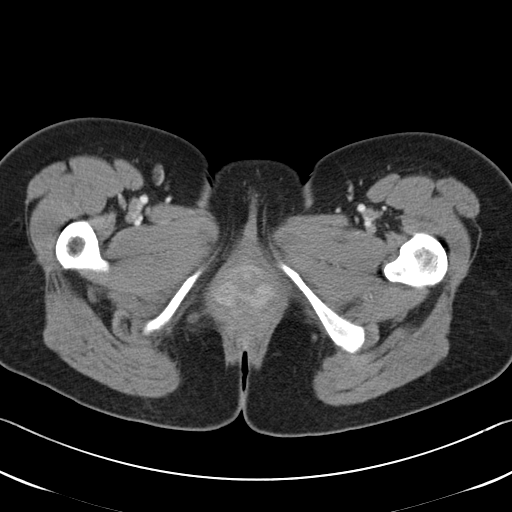
[im 4/78  bone]
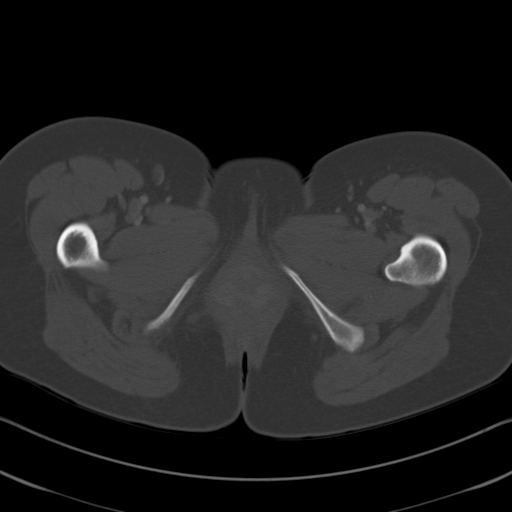
[im 11/78  soft-tissue]
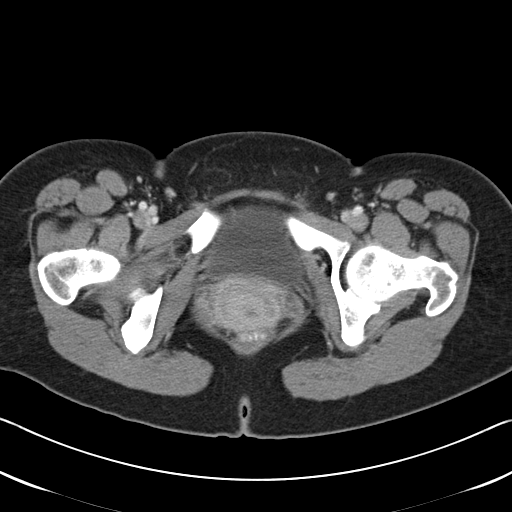
[im 17/78  soft-tissue]
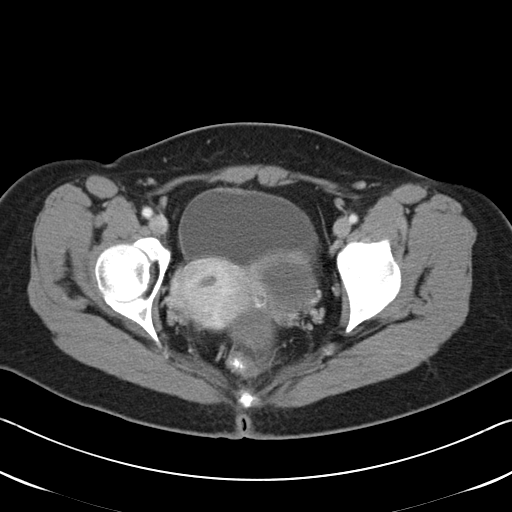
[im 21/78  soft-tissue]
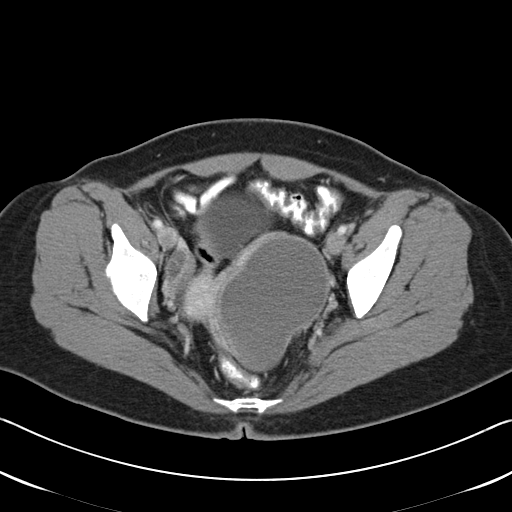
[im 27/78  soft-tissue]
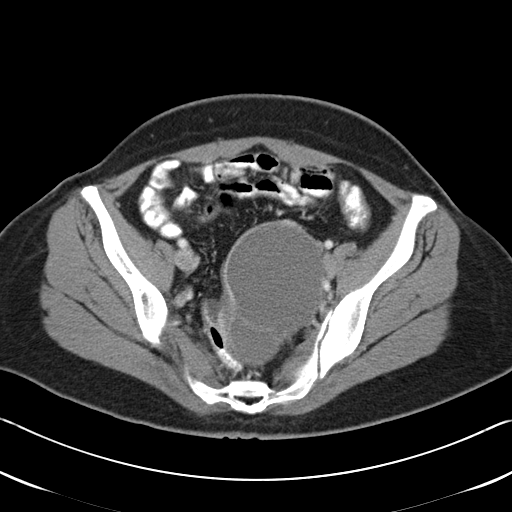
[im 34/78  soft-tissue]
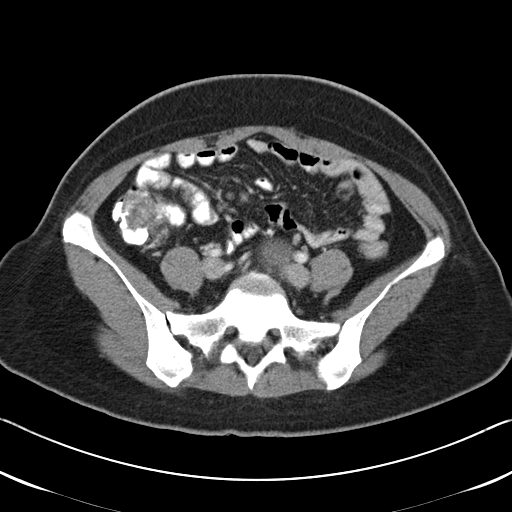
[im 41/78  soft-tissue]
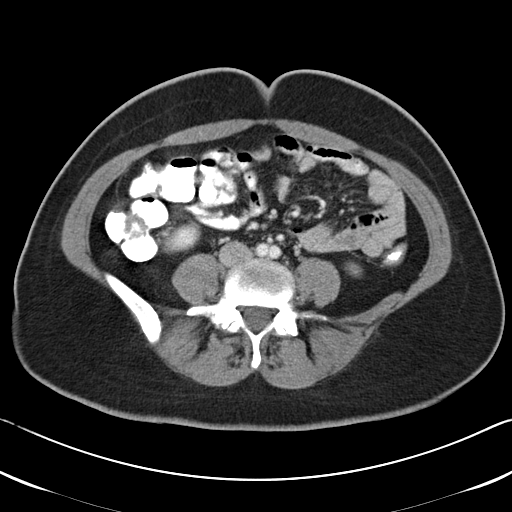
[im 44/78  soft-tissue]
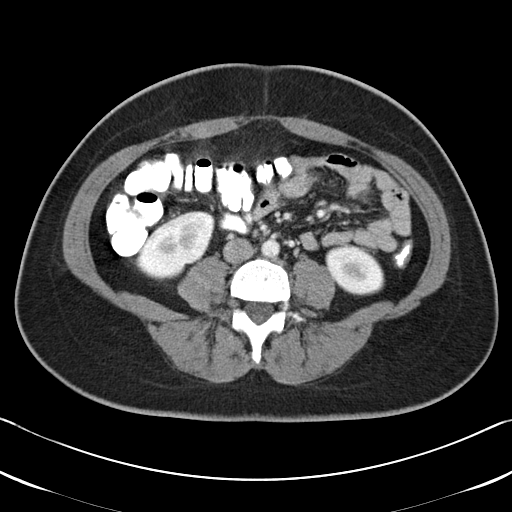
[im 51/78  soft-tissue]
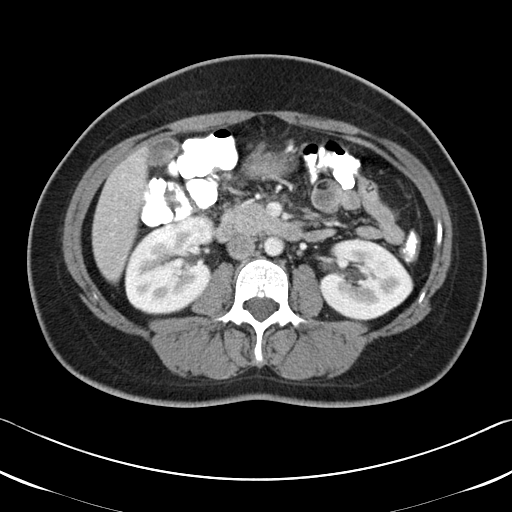
[im 51/78  bone]
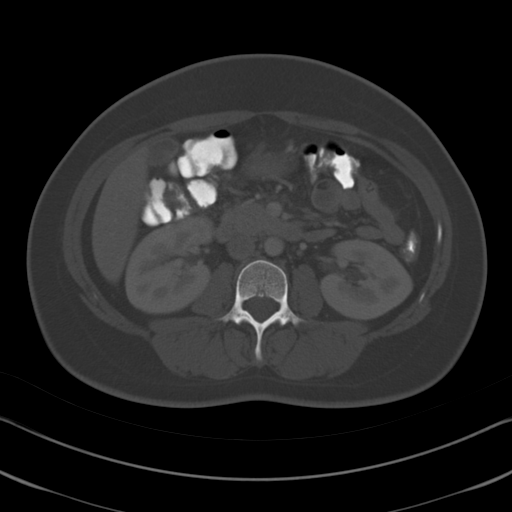
[im 57/78  soft-tissue]
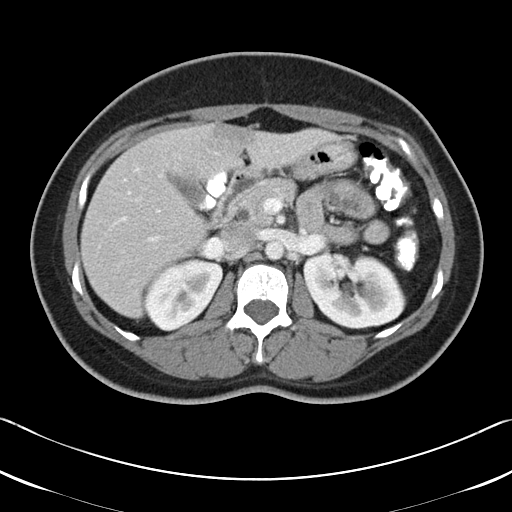
[im 61/78  soft-tissue]
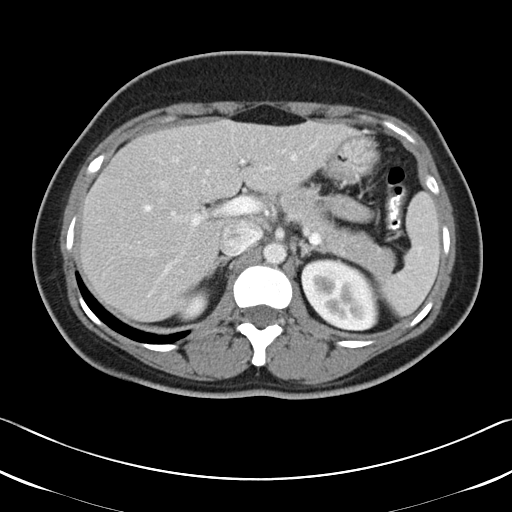
[im 67/78  soft-tissue]
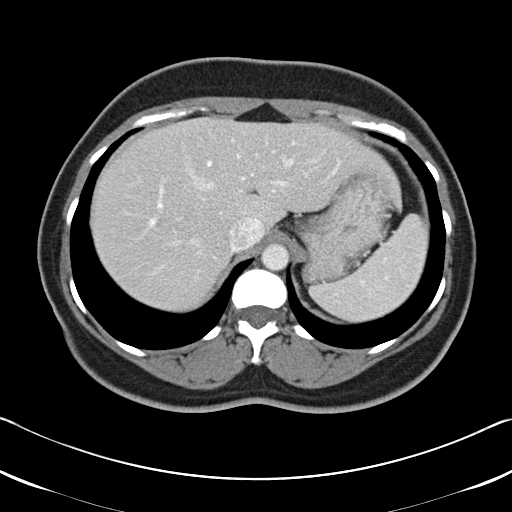
[im 74/78  soft-tissue]
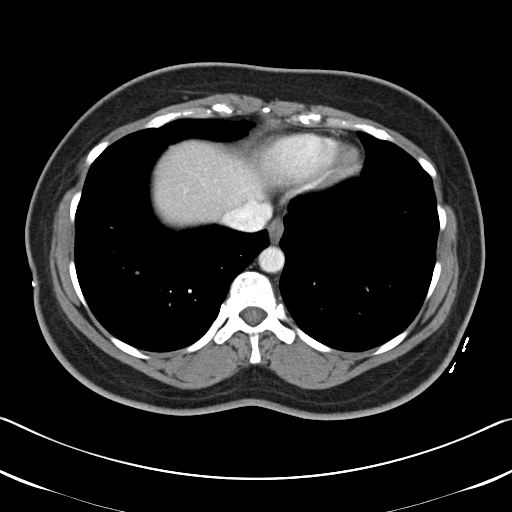

[Series 602: cor · coronal · 0.75mm/px · 3 of 71 slices shown]
[im 24/71  soft-tissue]
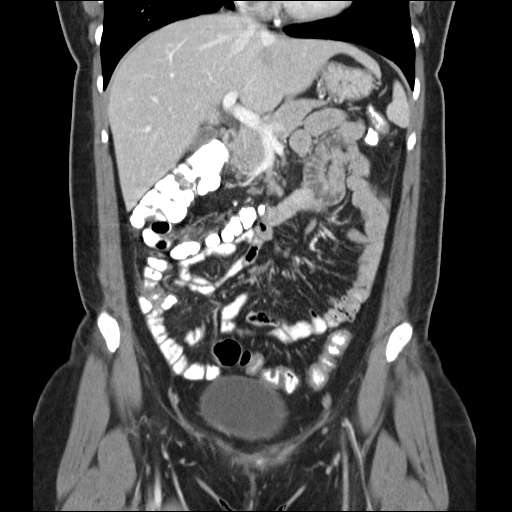
[im 32/71  soft-tissue]
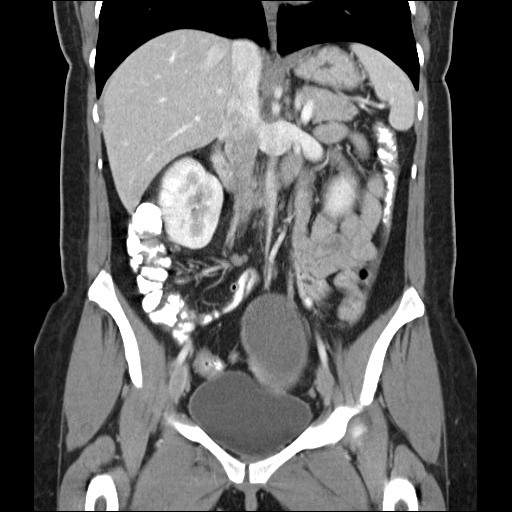
[im 39/71  soft-tissue]
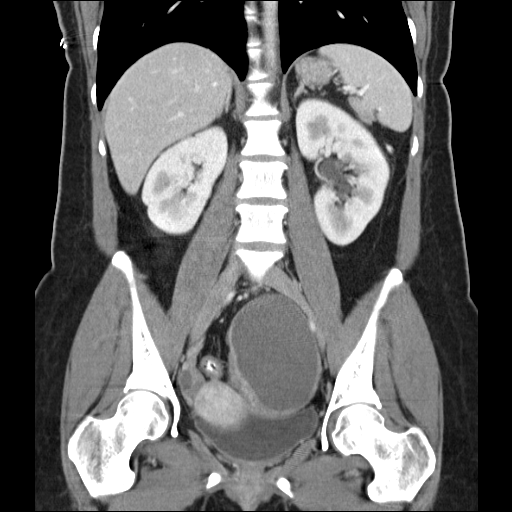

[16 of 46 positions shown; findings below may reference images not displayed]

FINDINGS: Lower chest:  Unremarkable.

Hepatobiliary: Small area of low attenuation in the anterior liver,
adjacent to the falciform ligament, is in a characteristic location
for focal fatty change. Liver otherwise unremarkable. There is no
evidence for gallstones, gallbladder wall thickening, or
pericholecystic fluid. No intrahepatic or extrahepatic biliary
dilation.

Pancreas: No focal mass lesion. No dilatation of the main duct. No
intraparenchymal cyst. No peripancreatic edema.

Spleen: No splenomegaly. No focal mass lesion.

Adrenals/Urinary Tract: No adrenal nodule or mass. Kidneys are
unremarkable. No evidence for hydroureter. The urinary bladder
appears normal for the degree of distention.

Stomach/Bowel: Stomach is nondistended. No gastric wall thickening.
No evidence of outlet obstruction. Duodenum is normally positioned
as is the ligament of Treitz. No small bowel wall thickening. No
small bowel dilatation. The terminal ileum is normal. The appendix
is normal. No gross colonic mass. No colonic wall thickening. No
substantial diverticular change.

Vascular/Lymphatic: No abdominal aortic aneurysm. There is no
gastrohepatic or hepatoduodenal ligament lymphadenopathy. No
intraperitoneal or retroperitoneal lymphadenopy. No mesenteric or
pelvic sidewall lymphadenopathy.

Reproductive: Uterus is unremarkable. 11.8 x 7.7 x 7.8 cm
multiloculated cystic mass is identified in the left adnexal space.
Left gonadal vasculature tracks into this lesion suggesting it is
ovarian in origin. There is some wall thickening up to about 7 mm
along the anteromedial margin of the lesion. No definite mural
nodule or papillary projection. There is some dystrophic
calcification associated with the wall of the lesion in a region of
septation.

Right ovary is unremarkable.

Other: No intraperitoneal free fluid.

Musculoskeletal: Bone windows reveal no worrisome lytic or sclerotic
osseous lesions.
IMPRESSION: 1. 11.8 cm multiloculated cystic mass in the right adnexal space,
concerning for neoplasm. While there is some mild smooth wall
thickening along the medial margin of the lesion, there is no
evidence for mural nodule or septal irregularity/thickening by CT
and this lesion may be benign.
2. No evidence for ascites.
3. No lymphadenopathy in the pelvis.

## 2016-06-22 ENCOUNTER — Encounter: Payer: Self-pay | Admitting: Family Medicine

## 2016-06-22 ENCOUNTER — Ambulatory Visit (INDEPENDENT_AMBULATORY_CARE_PROVIDER_SITE_OTHER): Payer: BLUE CROSS/BLUE SHIELD | Admitting: Family Medicine

## 2016-06-22 VITALS — BP 116/76 | HR 72 | Temp 98.8°F | Resp 17 | Ht 61.5 in | Wt 145.0 lb

## 2016-06-22 DIAGNOSIS — R5383 Other fatigue: Secondary | ICD-10-CM

## 2016-06-22 DIAGNOSIS — Z6826 Body mass index (BMI) 26.0-26.9, adult: Secondary | ICD-10-CM

## 2016-06-22 DIAGNOSIS — E7800 Pure hypercholesterolemia, unspecified: Secondary | ICD-10-CM | POA: Diagnosis not present

## 2016-06-22 DIAGNOSIS — N801 Endometriosis of ovary: Secondary | ICD-10-CM | POA: Diagnosis not present

## 2016-06-22 DIAGNOSIS — L710 Perioral dermatitis: Secondary | ICD-10-CM

## 2016-06-22 DIAGNOSIS — N80109 Endometriosis of ovary, unspecified side, unspecified depth: Secondary | ICD-10-CM

## 2016-06-22 DIAGNOSIS — Z0000 Encounter for general adult medical examination without abnormal findings: Secondary | ICD-10-CM | POA: Diagnosis not present

## 2016-06-22 DIAGNOSIS — R7302 Impaired glucose tolerance (oral): Secondary | ICD-10-CM | POA: Diagnosis not present

## 2016-06-22 DIAGNOSIS — R05 Cough: Secondary | ICD-10-CM

## 2016-06-22 DIAGNOSIS — M7989 Other specified soft tissue disorders: Secondary | ICD-10-CM

## 2016-06-22 DIAGNOSIS — N979 Female infertility, unspecified: Secondary | ICD-10-CM

## 2016-06-22 DIAGNOSIS — R059 Cough, unspecified: Secondary | ICD-10-CM

## 2016-06-22 LAB — POCT URINALYSIS DIP (MANUAL ENTRY)
BILIRUBIN UA: NEGATIVE mg/dL
Bilirubin, UA: NEGATIVE
Glucose, UA: NEGATIVE mg/dL
NITRITE UA: NEGATIVE
PH UA: 5.5 (ref 5.0–8.0)
PROTEIN UA: NEGATIVE mg/dL
Spec Grav, UA: 1.025 (ref 1.010–1.025)
UROBILINOGEN UA: 0.2 U/dL

## 2016-06-22 LAB — POCT URINE PREGNANCY: PREG TEST UR: NEGATIVE

## 2016-06-22 NOTE — Patient Instructions (Addendum)
[email or web address]  Call your gynecologist TODAY for a one year follow-up.  Start Flinstone Vitamin.   IF you received an x-ray today, you will receive an invoice from [facility]. Please contact [facility] at [phone] with questions or concerns regarding your invoice.   IF you received labwork today, you will receive an invoice from [facility]. Please contact [facility] at [phone] with questions or concerns regarding your invoice.   Our billing staff will not be able to assist you with questions regarding bills from these companies.  You will be contacted with the lab results as soon as they are available. The fastest way to get your results is to activate your My Chart account. Instructions are located on the last page of this paperwork. If you have not heard from [place] regarding the results in 2 weeks, please contact this office.    w Calorie Counting for Weight Loss Calories are units of energy. Your body needs a certain amount of calories from food to keep you going throughout the day. When you eat more calories than your body needs, your body stores the extra calories as fat. When you eat fewer calories than your body needs, your body burns fat to get the energy it needs. Calorie counting means keeping track of how many calories you eat and drink each day. Calorie counting can be helpful if you need to lose weight. If you make sure to eat fewer calories than your body needs, you should lose weight. Ask your health care provider what a healthy weight is for you. For calorie counting to work, you will need to eat the right number of calories in a day in order to lose a healthy amount of weight per week. A dietitian can help you determine how many calories you need in a day and will give you suggestions on how to reach your calorie goal.  A healthy amount of weight to lose per week is usually 1-2 lb (0.5-0.9 kg). This usually means that your daily calorie intake should  be reduced by 500-750 calories.  Eating 1,200 - 1,500 calories per day can help most women lose weight.  Eating 1,500 - 1,800 calories per day can help most men lose weight. What is my plan? My goal is to have __________ calories per day. If I have this many calories per day, I should lose around __________ pounds per week. What do I need to know about calorie counting? In order to meet your daily calorie goal, you will need to:  Find out how many calories are in each food you would like to eat. Try to do this before you eat.  Decide how much of the food you plan to eat.  Write down what you ate and how many calories it had. Doing this is called keeping a food log. To successfully lose weight, it is important to balance calorie counting with a healthy lifestyle that includes regular activity. Aim for 150 minutes of moderate exercise (such as walking) or 75 minutes of vigorous exercise (such as running) each week. Where do I find calorie information?   The number of calories in a food can be found on a Nutrition Facts label. If a food does not have a Nutrition Facts label, try to look up the calories online or ask your dietitian for help. Remember that calories are listed per serving. If you choose to have more than one serving of a food, you will have to multiply the calories per serving by the amount  of servings you plan to eat. For example, the label on a package of bread might say that a serving size is 1 slice and that there are 90 calories in a serving. If you eat 1 slice, you will have eaten 90 calories. If you eat 2 slices, you will have eaten 180 calories. How do I keep a food log? Immediately after each meal, record the following information in your food log:  What you ate. Don't forget to include toppings, sauces, and other extras on the food.  How much you ate. This can be measured in cups, ounces, or number of items.  How many calories each food and drink had.  The total  number of calories in the meal. Keep your food log near you, such as in a small notebook in your pocket, or use a mobile app or website. Some programs will calculate calories for you and show you how many calories you have left for the day to meet your goal. What are some calorie counting tips?  Use your calories on foods and drinks that will fill you up and not leave you hungry:  Some examples of foods that fill you up are nuts and nut butters, vegetables, lean proteins, and high-fiber foods like whole grains. High-fiber foods are foods with more than 5 g fiber per serving.  Drinks such as sodas, specialty coffee drinks, alcohol, and juices have a lot of calories, yet do not fill you up.  Eat nutritious foods and avoid empty calories. Empty calories are calories you get from foods or beverages that do not have many vitamins or protein, such as candy, sweets, and soda. It is better to have a nutritious high-calorie food (such as an avocado) than a food with few nutrients (such as a bag of chips).  Know how many calories are in the foods you eat most often. This will help you calculate calorie counts faster.  Pay attention to calories in drinks. Low-calorie drinks include water and unsweetened drinks.  Pay attention to nutrition labels for "low fat" or "fat free" foods. These foods sometimes have the same amount of calories or more calories than the full fat versions. They also often have added sugar, starch, or salt, to make up for flavor that was removed with the fat.  Find a way of tracking calories that works for you. Get creative. Try different apps or programs if writing down calories does not work for you. What are some portion control tips?  Know how many calories are in a serving. This will help you know how many servings of a certain food you can have.  Use a measuring cup to measure serving sizes. You could also try weighing out portions on a kitchen scale. With time, you will be  able to estimate serving sizes for some foods.  Take some time to put servings of different foods on your favorite plates, bowls, and cups so you know what a serving looks like.  Try not to eat straight from a bag or box. Doing this can lead to overeating. Put the amount you would like to eat in a cup or on a plate to make sure you are eating the right portion.  Use smaller plates, glasses, and bowls to prevent overeating.  Try not to multitask (for example, watch TV or use your computer) while eating. If it is time to eat, sit down at a table and enjoy your food. This will help you to know when you are full.  It will also help you to be aware of what you are eating and how much you are eating. What are tips for following this plan? Reading food labels   Check the calorie count compared to the serving size. The serving size may be smaller than what you are used to eating.  Check the source of the calories. Make sure the food you are eating is high in vitamins and protein and low in saturated and trans fats. Shopping   Read nutrition labels while you shop. This will help you make healthy decisions before you decide to purchase your food.  Make a grocery list and stick to it. Cooking   Try to cook your favorite foods in a healthier way. For example, try baking instead of frying.  Use low-fat dairy products. Meal planning   Use more fruits and vegetables. Half of your plate should be fruits and vegetables.  Include lean proteins like poultry and fish. How do I count calories when eating out?  Ask for smaller portion sizes.  Consider sharing an entree and sides instead of getting your own entree.  If you get your own entree, eat only half. Ask for a box at the beginning of your meal and put the rest of your entree in it so you are not tempted to eat it.  If calories are listed on the menu, choose the lower calorie options.  Choose dishes that include vegetables, fruits, whole  grains, low-fat dairy products, and lean protein.  Choose items that are boiled, broiled, grilled, or steamed. Stay away from items that are buttered, battered, fried, or served with cream sauce. Items labeled "crispy" are usually fried, unless stated otherwise.  Choose water, low-fat milk, unsweetened iced tea, or other drinks without added sugar. If you want an alcoholic beverage, choose a lower calorie option such as a glass of wine or light beer.  Ask for dressings, sauces, and syrups on the side. These are usually high in calories, so you should limit the amount you eat.  If you want a salad, choose a garden salad and ask for grilled meats. Avoid extra toppings like bacon, cheese, or fried items. Ask for the dressing on the side, or ask for olive oil and vinegar or lemon to use as dressing.  Estimate how many servings of a food you are given. For example, a serving of cooked rice is  cup or about the size of half a baseball. Knowing serving sizes will help you be aware of how much food you are eating at restaurants. The list below tells you how big or small some common portion sizes are based on everyday objects:  1 oz-4 stacked dice.  3 oz-1 deck of cards.  1 tsp-1 die.  1 Tbsp- a ping-pong ball.  2 Tbsp-1 ping-pong ball.   cup- baseball.  1 cup-1 baseball. Summary  Calorie counting means keeping track of how many calories you eat and drink each day. If you eat fewer calories than your body needs, you should lose weight.  A healthy amount of weight to lose per week is usually 1-2 lb (0.5-0.9 kg). This usually means reducing your daily calorie intake by 500-750 calories.  The number of calories in a food can be found on a Nutrition Facts label. If a food does not have a Nutrition Facts label, try to look up the calories online or ask your dietitian for help.  Use your calories on foods and drinks that will fill you up, and not  on foods and drinks that will leave you  hungry.  Use smaller plates, glasses, and bowls to prevent overeating. This information is not intended to replace advice given to you by your health care provider. Make sure you discuss any questions you have with your health care provider. Document Released: 01/30/2005 Document Revised: 12/31/2015 Document Reviewed: 12/31/2015 Elsevier Interactive Patient Education  2017 [employer].

## 2016-06-22 NOTE — Progress Notes (Signed)
Subjective:    Patient ID: Marie Hill, female    DOB: December 02, 1980, 36 y.o.   MRN: [id number]  06/22/2016  Annual Exam   HPI This 36 y.o. female presents for Complete Physical Examination.  Last physical:  07-15-2014 Pap smear:  06-22-2014 WNL HPV negative; every 28 days; menses 4 days. Eye exam:  2016 Dental exam:  Every six months.  Immunization History  Administered Date(s) Administered  . Influenza,inj,Quad PF,36+ Mos 10/12/2014  . Tdap 06/22/2014   BP Readings from Last 3 Encounters:  07/04/16 116/74  06/22/16 116/76  07/05/15 118/76   Wt Readings from Last 3 Encounters:  07/04/16 144 lb (65.3 kg)  06/22/16 145 lb (65.8 kg)  10/12/14 126 lb (57.2 kg)    B axilla swelling asymmetric: continues to be present and has worsened as has gained weight.  Very worried about breast pathology.  Rash racial: mild itching; around mouth; no new soaps, facial products. No new medications.    Review of Systems  Constitutional: Positive for fatigue and unexpected weight change. Negative for activity change, appetite change, chills, diaphoresis and fever.  HENT: Negative for congestion, dental problem, drooling, ear discharge, ear pain, facial swelling, hearing loss, mouth sores, nosebleeds, postnasal drip, rhinorrhea, sinus pressure, sneezing, sore throat, tinnitus, trouble swallowing and voice change.   Eyes: Positive for photophobia. Negative for pain, discharge, redness, itching and visual disturbance.  Respiratory: Positive for cough. Negative for apnea, choking, chest tightness, shortness of breath, wheezing and stridor.   Cardiovascular: Negative for chest pain, palpitations and leg swelling.  Gastrointestinal: Positive for abdominal pain. Negative for abdominal distention, anal bleeding, blood in stool, constipation, diarrhea, nausea, rectal pain and vomiting.  Endocrine: Negative for cold intolerance, heat intolerance, polydipsia, polyphagia and polyuria.  Genitourinary: Negative  for decreased urine volume, difficulty urinating, dyspareunia, dysuria, enuresis, flank pain, frequency, genital sores, hematuria, menstrual problem, pelvic pain, urgency, vaginal bleeding, vaginal discharge and vaginal pain.       Urinary leakage stress with coughing.    Musculoskeletal: Negative for arthralgias, back pain, gait problem, joint swelling, myalgias, neck pain and neck stiffness.  Skin: Positive for rash. Negative for color change, pallor and wound.  Allergic/Immunologic: Negative for environmental allergies, food allergies and immunocompromised state.  Neurological: Negative for dizziness, tremors, seizures, syncope, facial asymmetry, speech difficulty, weakness, light-headedness, numbness and headaches.  Hematological: Negative for adenopathy. Does not bruise/bleed easily.  Psychiatric/Behavioral: Negative for agitation, behavioral problems, confusion, decreased concentration, dysphoric mood, hallucinations, self-injury, sleep disturbance and suicidal ideas. The patient is not nervous/anxious and is not hyperactive.        Bedtime 12:00am; wakes up 7:00am.      Past Medical History:  Diagnosis Date  . Dandruff   . 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 Glucose intolerance (impaired glucose tolerance)   . Hyperlipidemia   . Positive PPD, treated    states follow up chest x rays negative  . 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 LAPAROSCOPIC UNILATERAL SALPINGO OOPHERECTOMY N/A 09/29/2014   Procedure: LAPAROSCOPIC LEFT SALPINGO OOPHORECTOMY ADHESIOLYSIS ;  Surgeon: Everitt Amber, MD;  Location: [facility];  Service: Gynecology;  Laterality: N/A;  . ROBOTIC ASSISTED LAPAROSCOPIC OVARIAN CYSTECTOMY N/A 09/29/2014   Procedure: SI ROBOTIC ASSISTED LAPAROSCOPIC OVARIAN CYSTECTOMY;  Surgeon: Everitt Amber, MD;  Location: [facility];  Service: Gynecology;  Laterality: N/A;  No Known Allergies Current Outpatient Prescriptions  Medication Sig Dispense Refill  . erythromycin with ethanol (EMGEL) 2 % gel Apply topically 2 (two) times daily. 30 g 0   No current facility-administered medications for this visit.    Social History   Social History  . Marital status: Married    Spouse name: N/A  . Number of children: 0  . Years of education: N/A   Occupational History  . Metallurgist    Social History Main Topics  . Smoking status: Never Smoker  . Smokeless tobacco: Never Used  . Alcohol use 0.0 oz/week     Comment: rarely a beer  . Drug use: No  . Sexual activity: Yes    Birth control/ protection: None   Other Topics Concern  . Not on file   Social History Narrative   Marital status:  Single; together x 8 years.      Children: none      Lives: with boyfriend; from [place]; moved to [place] at age 34.      Employment:  Cytogeneticist.      Tobacco: none      Alcohol: weekends      Drugs: none      Exercise:  Sporadic      Seatbelt: 100%; no texting   Family History  Problem Relation Age of Onset  . Hyperlipidemia Mother   . Hypertension Mother   . Hyperlipidemia Father        Objective:    BP 116/76 (BP Location: Right Arm, Patient Position: Sitting, Cuff Size: Normal)   Pulse 72   Temp 98.8 F (37.1 C) (Oral)   Resp 17   Ht 5' 1.5" (1.562 m)   Wt 145 lb (65.8 kg)   LMP 06/02/2016   SpO2 94%   Breastfeeding? Unknown   BMI 26.95 kg/m  Physical Exam  Constitutional: She is oriented to person, place, and time. She appears well-developed and well-nourished. No distress.  HENT:  Head: Normocephalic and atraumatic.  Right Ear: External ear normal.  Left Ear: External ear normal.  Nose: Nose normal.  Mouth/Throat: Oropharynx is clear and moist.  Eyes: Conjunctivae and EOM are normal. Pupils are equal, round, and reactive to light.  Neck: Normal range of motion and full passive range of motion  without pain. Neck supple. No JVD present. Carotid bruit is not present. No thyromegaly present.  Cardiovascular: Normal rate, regular rhythm and normal heart sounds.  Exam reveals no gallop and no friction rub.   No murmur heard. Pulmonary/Chest: Effort normal and breath sounds normal. She has no wheezes. She has no rales. Right breast exhibits no inverted nipple, no mass, no nipple discharge, no skin change and no tenderness. Left breast exhibits no inverted nipple, no mass, no nipple discharge, no skin change and no tenderness. Breasts are asymmetrical.  +swelling in B axilla regions and asymmetric.  Abdominal: Soft. Bowel sounds are normal. She exhibits no distension and no mass. There is no tenderness. There is no rebound and no guarding.  Genitourinary: Vagina normal and uterus normal. There is no rash, tenderness, lesion or injury on the right labia. There is no rash, tenderness, lesion or injury on the left labia. Cervix exhibits no motion tenderness. Right adnexum displays no mass, no tenderness and no fullness. Left adnexum displays no mass, no tenderness and no fullness.  Musculoskeletal:       Right shoulder: Normal.       Left shoulder: Normal.  Cervical back: Normal.  Lymphadenopathy:    She has no cervical adenopathy.  Neurological: She is alert and oriented to person, place, and time. She has normal reflexes. No cranial nerve deficit. She exhibits normal muscle tone. Coordination normal.  Skin: Skin is warm and dry. No rash noted. She is not diaphoretic. No erythema. No pallor.  Mild maculopapular rash perioral region.  Psychiatric: She has a normal mood and affect. Her behavior is normal. Judgment and thought content normal.  Nursing note and vitals reviewed.   Depression screen [facility] 2/9 06/22/2016 10/12/2014 08/05/2014 06/22/2014  Decreased Interest 0 0 0 0  Down, Depressed, Hopeless 0 0 0 0  PHQ - 2 Score 0 0 0 0       Assessment & Plan:   1. Routine physical  examination   2. Glucose intolerance (impaired glucose tolerance)   3. Infertility, female, primary   4. Endometriosis of ovary   5. Pure hypercholesterolemia   6. Other fatigue   7. Left axillary swelling   8. Cough   9. BMI 26.0-26.9,adult   10. Perioral dermatitis    -anticipatory guidance provided --- exercise, weight loss, safe driving practices, safe sexual practices. -obtain age appropriate screening labs and labs for chronic disease management. -suffering with infertility thus encourage patient to call gynecologist today for an appointment due to [facility].  Advised to start a Flinstone vitamin since intolerant to PNV. -refer for medical weight loss management; also recommend [email or web address] -suffering with LEFT axillary swelling; refer for diagnostic mammogram and LEFT breast US. -suffering with recent cough; recommend Mucinex DM bid; also recommend oral antihistamine to treat underlying allergies; consider Flonase. -has mild perioral dermatitis present. Will treat with topical erythromycin. -suffering with excessive fatigue; obtain labs; consider sleep study in future if labs are normal.  Recommend regular exercise and weight loss.   Orders Placed This Encounter  Procedures  . CBC with Differential/Platelet  . Comprehensive metabolic panel    Order Specific Question:   Has the patient fasted?    Answer:   Yes  . Hemoglobin A1c  . Lipid panel    Order Specific Question:   Has the patient fasted?    Answer:   Yes  . TSH  . Vitamin B12  . VITAMIN D 25 Hydroxy (Vit-D Deficiency, Fractures)  . Amb Ref to Medical Weight Management    Referral Priority:   Routine    Referral Type:   Consultation    Number of Visits Requested:   1  . POCT urinalysis dipstick  . POCT urine pregnancy   Meds ordered this encounter  Medications  . erythromycin with ethanol (EMGEL) 2 % gel    Sig: Apply topically 2 (two) times daily.    Dispense:  30 g    Refill:  0    No Follow-up on  file.   Bassheva Flury Elayne Guerin, M.D. Primary Care at [facility] previously [facility] [place] [address] [place], [place]  [address] [phone] phone [phone] fax

## 2016-06-23 LAB — COMPREHENSIVE METABOLIC PANEL
ALBUMIN: 4.3 g/dL (ref 3.5–5.5)
ALT: 33 IU/L — ABNORMAL HIGH (ref 0–32)
AST: 20 IU/L (ref 0–40)
Albumin/Globulin Ratio: 1.2 (ref 1.2–2.2)
Alkaline Phosphatase: 104 IU/L (ref 39–117)
BUN/Creatinine Ratio: 16 (ref 9–23)
BUN: 9 mg/dL (ref 6–20)
Bilirubin Total: 0.5 mg/dL (ref 0.0–1.2)
CO2: 25 mmol/L (ref 18–29)
CREATININE: 0.58 mg/dL (ref 0.57–1.00)
Calcium: 9.7 mg/dL (ref 8.7–10.2)
Chloride: 98 mmol/L (ref 96–106)
GFR calc Af Amer: 137 mL/min/{1.73_m2} (ref >59)
GFR, EST NON AFRICAN AMERICAN: 119 mL/min/{1.73_m2} (ref >59)
GLOBULIN, TOTAL: 3.5 g/dL (ref 1.5–4.5)
GLUCOSE: 130 mg/dL — AB (ref 65–99)
Potassium: 4.3 mmol/L (ref 3.5–5.2)
SODIUM: 138 mmol/L (ref 134–144)
Total Protein: 7.8 g/dL (ref 6.0–8.5)

## 2016-06-23 LAB — CBC WITH DIFFERENTIAL/PLATELET
BASOS ABS: 0 10*3/uL (ref 0.0–0.2)
Basos: 0 %
EOS (ABSOLUTE): 0.2 10*3/uL (ref 0.0–0.4)
Eos: 2 %
HEMATOCRIT: 39.5 % (ref 34.0–46.6)
Hemoglobin: 12.8 g/dL (ref 11.1–15.9)
IMMATURE GRANULOCYTES: 1 %
Immature Grans (Abs): 0 10*3/uL (ref 0.0–0.1)
Lymphocytes Absolute: 2.5 10*3/uL (ref 0.7–3.1)
Lymphs: 31 %
MCH: 26.3 pg — ABNORMAL LOW (ref 26.6–33.0)
MCHC: 32.4 g/dL (ref 31.5–35.7)
MCV: 81 fL (ref 79–97)
MONOCYTES: 7 %
Monocytes Absolute: 0.6 10*3/uL (ref 0.1–0.9)
NEUTROS PCT: 59 %
Neutrophils Absolute: 4.8 10*3/uL (ref 1.4–7.0)
Platelets: 304 10*3/uL (ref 150–379)
RBC: 4.87 x10E6/uL (ref 3.77–5.28)
RDW: 14.2 % (ref 12.3–15.4)
WBC: 8 10*3/uL (ref 3.4–10.8)

## 2016-06-23 LAB — VITAMIN B12: Vitamin B-12: 693 pg/mL (ref 232–1245)

## 2016-06-23 LAB — TSH: TSH: 1.25 u[IU]/mL (ref 0.450–4.500)

## 2016-06-23 LAB — LIPID PANEL
Chol/HDL Ratio: 4.9 ratio — ABNORMAL HIGH (ref 0.0–4.4)
Cholesterol, Total: 220 mg/dL — ABNORMAL HIGH (ref 100–199)
HDL: 45 mg/dL (ref >39)
LDL Calculated: 153 mg/dL — ABNORMAL HIGH (ref 0–99)
TRIGLYCERIDES: 109 mg/dL (ref 0–149)
VLDL CHOLESTEROL CAL: 22 mg/dL (ref 5–40)

## 2016-06-23 LAB — HEMOGLOBIN A1C
ESTIMATED AVERAGE GLUCOSE: 143 mg/dL
HEMOGLOBIN A1C: 6.6 % — AB (ref 4.8–5.6)

## 2016-06-23 LAB — VITAMIN D 25 HYDROXY (VIT D DEFICIENCY, FRACTURES): VIT D 25 HYDROXY: 27.6 ng/mL — AB (ref 30.0–100.0)

## 2016-06-28 ENCOUNTER — Encounter: Payer: Self-pay | Admitting: Gynecology

## 2016-07-04 ENCOUNTER — Telehealth: Payer: Self-pay | Admitting: *Deleted

## 2016-07-04 ENCOUNTER — Encounter: Payer: Self-pay | Admitting: Gynecology

## 2016-07-04 ENCOUNTER — Ambulatory Visit (INDEPENDENT_AMBULATORY_CARE_PROVIDER_SITE_OTHER): Payer: BLUE CROSS/BLUE SHIELD | Admitting: Gynecology

## 2016-07-04 VITALS — BP 116/74 | Ht 61.5 in | Wt 144.0 lb

## 2016-07-04 DIAGNOSIS — Z01419 Encounter for gynecological examination (general) (routine) without abnormal findings: Secondary | ICD-10-CM | POA: Diagnosis not present

## 2016-07-04 DIAGNOSIS — Z8742 Personal history of other diseases of the female genital tract: Secondary | ICD-10-CM

## 2016-07-04 DIAGNOSIS — Z3169 Encounter for other general counseling and advice on procreation: Secondary | ICD-10-CM | POA: Diagnosis not present

## 2016-07-04 DIAGNOSIS — R2232 Localized swelling, mass and lump, left upper limb: Secondary | ICD-10-CM | POA: Diagnosis not present

## 2016-07-04 NOTE — Patient Instructions (Signed)
In Vitro Fertilization In vitro fertilization (IVF) is a series of procedures that are used to help with getting pregnant (conceiving). It can be used to help treat problems with fertility or genetics. IVF is a type of assisted reproductive technology (ART). ART refers to all treatments and procedures that combine eggs and sperm outside of the body to try to help a couple conceive. During IVF, eggs are retrieved from the ovaries and combined with sperm in a lab to fertilize the eggs. One or more of the fertilized eggs (embryos) are inserted into the uterus through the cervix. Candidates for IVF include:  People who are infertile. Infertility is when you are unable to conceive after a year of having sex regularly without using birth control. Infertility can also mean that a woman is not able to carry a pregnancy to full term.  Women who have undergone early (premature) menopause or ovarian failure.  Women who had both ovaries removed. In this case, donor eggs must be used.  Women who have damaged or blocked fallopian tubes. There is no age limit for having IVF, but it is not recommended for women who have gone through menopause (postmenopausal women). The ideal age for IVF is age 73 or younger. Women age 76 or older are often counseled to consider using donor eggs during IVF to increase the chances of success. Tell a health care provider about:  Any allergies you have.  All medicines you are taking, including vitamins, herbs, eye drops, creams, and over-the-counter medicines.  Any problems you or family members have had with anesthetic medicines.  Any blood disorders you have.  Any surgeries you have had.  Any medical conditions you have.  Previous pregnancies you have had.  Any history of drug use, smoking, or excessive alcohol use. What are the risks? Generally, this is a safe procedure. However, problems may occur, including:  Infection.  Bleeding.  Allergic reactions to  medicines.  Damage to other structures or organs.  Blood clots.  The procedure not working.  Having twins or multiples.  Increased risk of early delivery. What happens before the procedure? Before beginning a cycle of IVF, you and the sperm donor may need various tests (screenings) to make sure that IVF is right for you. You and the donor:  Must provide a complete medical history and the medical history of your families.  Will have a physical exam.  May need blood tests to check for infectious diseases, including HIV (human immunodeficiency virus). You may have other tests, such as:  Testing of your ovaries to determine the quality and quantity of your eggs.  Hormone tests and ovulation testing.  An exam of your uterus. This may be done with:  Sonohysterogram. This exam uses sound waves sent to a computer to make real-time images of the inside of your uterus. To get the best images, a germ-free salt-water solution (sterile saline) is injected into your uterus through your vagina.  Hysteroscopy. In this procedure, a thin, flexible tube with a tiny light and camera on the end of it (hysteroscope) is inserted through your vagina and into your uterus. The donor sperm will be taken and analyzed to check whether:  The sperm are normal.  There are enough sperm to fertilize the egg.  The sperm act normally after sexual intercourse (postcoital exam). What happens during the procedure? IVF involves several procedures. One cycle of IVF can take about 2 weeks, and more than one cycle may be required. The steps of IVF are:  Ovarian stimulation.  If you use your own eggs during IVF, you will begin treatment with artificial (synthetic) hormones at the start of a cycle. This treatment stimulates your ovaries to produce multiple eggs, rather than the single egg that normally develops each month.  Multiple eggs are needed because some eggs will not fertilize or will not develop normally  after fertilization.  Egg retrieval.  Using ultrasound images as a guide, a thin needle will be inserted through your vagina and into the ovary and sacs (follicles) that contain the eggs.  The needle is connected to a suction device, which will pull the eggs and fluid out of each follicle, one at a time.  The procedure will be repeated for the other ovary.  Insemination and fertilization.  The sperm will be mixed together with your eggs (insemination) and stored in an environmentally controlled chamber.  The sperm usually enters (fertilizes) an egg a few hours after insemination.  Embryo transfer. This usually takes place 2-6 days after the egg retrieval.  The fertilized eggs (embryos) will be placed into your uterus using a thin tube (catheter). The catheter will be inserted into your vagina, through your cervix, and into your uterus.  If successful, the embryo will stick to the lining of your uterus (implant) about 6-10 days after egg retrieval. If an embryo implants in the lining of the uterus and grows, pregnancy will result. These procedures may vary among health care providers and hospitals. What happens after the procedure?  You may need to lie down and rest for a short time.  You may continue to take hormone therapy. You may take hormone therapy for up to 3 months, as instructed by your health care provider. Summary  In vitro fertilization (IVF) is a series of procedures that are used to help with getting pregnant (conceiving).  Before beginning a cycle of IVF, you and the sperm donor may need various tests (screenings) to make sure that IVF is right for you.  IVF involves several procedures. One cycle of IVF can take about 2 weeks, and more than one cycle may be required. This information is not intended to replace advice given to you by your health care provider. Make sure you discuss any questions you have with your health care provider. Document Released: 01/13/2008  Document Revised: 11/01/2015 Document Reviewed: 11/01/2015 Elsevier Interactive Patient Education  2017 [employer].

## 2016-07-04 NOTE — Telephone Encounter (Signed)
Pt scheduled on 07/21/16 @ 10:45am with Dr. Kieth Brightly pt informed.

## 2016-07-04 NOTE — Telephone Encounter (Signed)
-----   Message from Terrance Mass, MD sent at 07/04/2016 10:07 AM EDT ----- Anderson Malta, please schedule diagnostic mammogram/ultrasound for this patient with a large left axillary mass. Also a general surgery consultation the week after her scan

## 2016-07-04 NOTE — Progress Notes (Signed)
Marie Hill 10-Aug-1980 [id number]   History:    37 y.o.  for annual gyn exam who has not been seen the office since May 2017 when she just had a complete miscarriage. Prior to that in August 2016 GYN oncologist Dr. Denman George had done a robotic left salpingo-oophorectomy secondary to large left mass which turned out to be an endometrioma with benign fallopian tube and stage IV endometriosis. Patient had an HSG in October 2016 which had demonstrated that she had a patent right fallopian tube. Patient reports normal menstrual cycles and has not gotten pregnant. Patient with no past history of any abnormal Pap smear. Patient complaining of a left axillary mass which has grown and causing her discomfort.  Past medical history,surgical history, family history and social history were all reviewed and documented in the EPIC chart.  Gynecologic History Patient's last menstrual period was 07/03/2016. Contraception: none Last Pap: 2016. Results were: normal Last mammogram: Not indicated. Results were: Not indicated  Obstetric History OB History  Gravida Para Term Preterm AB Living  1 0 0 0 1 0  SAB TAB Ectopic Multiple Live Births  1 0 0 0      # Outcome Date GA Lbr Len/2nd Weight Sex Delivery Anes PTL Lv  1 SAB                ROS: A ROS was performed and pertinent positives and negatives are included in the history.  GENERAL: No fevers or chills. HEENT: No change in vision, no earache, sore throat or sinus congestion. NECK: No pain or stiffness. CARDIOVASCULAR: No chest pain or pressure. No palpitations. PULMONARY: No shortness of breath, cough or wheeze. GASTROINTESTINAL: No abdominal pain, nausea, vomiting or diarrhea, melena or bright red blood per rectum. GENITOURINARY: No urinary frequency, urgency, hesitancy or dysuria. MUSCULOSKELETAL: No joint or muscle pain, no back pain, no recent trauma. DERMATOLOGIC: No rash, no itching, no lesions. ENDOCRINE: No polyuria, polydipsia, no heat or cold  intolerance. No recent change in weight. HEMATOLOGICAL: No anemia or easy bruising or bleeding. NEUROLOGIC: No headache, seizures, numbness, tingling or weakness. PSYCHIATRIC: No depression, no loss of interest in normal activity or change in sleep pattern.     Exam: chaperone present  BP 116/74   Ht 5' 1.5" (1.562 m)   Wt 144 lb (65.3 kg)   LMP 07/03/2016   BMI 26.77 kg/m   Body mass index is 26.77 kg/m.  General appearance : Well developed well nourished female. No acute distress HEENT: Eyes: no retinal hemorrhage or exudates,  Neck supple, trachea midline, no carotid bruits, no thyroidmegaly Lungs: Clear to auscultation, no rhonchi or wheezes, or rib retractions  Heart: Regular rate and rhythm, no murmurs or gallops Breast:Examined in sitting and supine position were symmetrical in appearance, no palpable masses or tenderness,  no skin retraction, no nipple inversion, no nipple discharge, no skin discoloration, left axillary mass 3 x 3 cm and tender Abdomen: no palpable masses or tenderness, no rebound or guarding Extremities: no edema or skin discoloration or tenderness  Pelvic:  B[place]olin, Urethra, Skene Glands: Within normal limits             Vagina: No gross lesions or discharge, menstrual blood present  Cervix: No gross lesions or discharge  Uterus  anteverted, normal size, shape and consistency, non-tender and mobile  Adnexa  Without masses or tenderness  Anus and perineum  normal   Rectovaginal  normal sphincter tone without palpated masses or tenderness  Hemoccult not indicated     Assessment/Plan:  36 y.o. female for annual exam will be sent for a diagnostic mammogram/ultrasound of left breast and axilla secondary to large axillary mass which appears to be a lipoma. Patient states is causing her discomfort and for this reason we are also going to refer her to the general surgeon after her scan. Her PCP has been doing her blood work. I've given her  instructions on the utilization of ovulation predictor kit to time or intercourse. Her partner is out of town and will be back until July. If after 3 months that she times or intercourse and does not conceive patient will be referred to the reproductive endocrinologist for consideration of in vitro fertilization because of her age and stage IV endometriosis. Literature information was provided. Pap smear not indicated today.   Terrance Mass MD, 10:01 AM 07/04/2016

## 2016-07-04 NOTE — Telephone Encounter (Signed)
Appointment at [facility] center on 07/07/16 @ 10:40am. Pt aware  Referral faxed to [facility] they will fax me back with time and date to relay.

## 2016-07-07 ENCOUNTER — Ambulatory Visit
Admission: RE | Admit: 2016-07-07 | Discharge: 2016-07-07 | Disposition: A | Discharge status: Home / Self Care | Payer: BLUE CROSS/BLUE SHIELD | Source: Ambulatory Visit | Attending: Gynecology | Admitting: Gynecology

## 2016-07-07 DIAGNOSIS — R2232 Localized swelling, mass and lump, left upper limb: Secondary | ICD-10-CM

## 2016-07-10 MED ORDER — ERYTHROMYCIN 2 % EX GEL: Freq: Two times a day (BID) | CUTANEOUS | 0 refills | Status: DC

## 2017-02-20 ENCOUNTER — Ambulatory Visit: Payer: BLUE CROSS/BLUE SHIELD | Admitting: Obstetrics & Gynecology

## 2017-02-20 ENCOUNTER — Encounter: Payer: Self-pay | Admitting: Obstetrics & Gynecology

## 2017-02-20 VITALS — BP 130/78

## 2017-02-20 DIAGNOSIS — Z8742 Personal history of other diseases of the female genital tract: Secondary | ICD-10-CM

## 2017-02-20 DIAGNOSIS — N979 Female infertility, unspecified: Secondary | ICD-10-CM | POA: Diagnosis not present

## 2017-02-20 DIAGNOSIS — N841 Polyp of cervix uteri: Secondary | ICD-10-CM | POA: Diagnosis not present

## 2017-02-20 DIAGNOSIS — N898 Other specified noninflammatory disorders of vagina: Secondary | ICD-10-CM

## 2017-02-20 LAB — WET PREP FOR TRICH, YEAST, CLUE

## 2017-02-20 NOTE — Progress Notes (Signed)
    Marie Hill 1981/01/27 [id number]        37 y.o.  G1P0010 Married  RP:  Primary infertility  HPI: Since last visit with Dr. Toney Rakes on Jul 04, 2016, patient has had regular periods every 28 days with normal flow and no pelvic pain.  No pain with intercourse.  She has attempted conception every cycle starting on day 12 of the cycle.  Patient had a normal LMP on February 08, 2017.  Husband complains of a burning sensation with intercourse.  Per Dr Toney Rakes' notes 07/04/2016 at Annual/Gyn visit:  Seen last in May 2017 when she just had a complete miscarriage. Prior to that in August 2016 GYN oncologist Dr. Denman George had done a robotic left salpingo-oophorectomy secondary to large left mass which turned out to be an endometrioma with benign fallopian tube and stage IV endometriosis. Patient had an HSG in October 2016 which had demonstrated that she had a patent right fallopian tube. Patient reports normal menstrual cycles and has not gotten pregnant.   Past medical history,surgical history, problem list, medications, allergies, family history and social history were all reviewed and documented in the EPIC chart.  Directed ROS with pertinent positives and negatives documented in the history of present illness/assessment and plan.  Exam:  Vitals:   02/20/17 0935  BP: 130/78   General appearance:  Normal  Abdomen normal  Gynecologic exam: Vulva normal.  Speculum exam: Normal cervix except for very small polyp and normal vagina.  Normal secretions.  Wet prep done.  Small cervical polyp removed easily and sent to pathology.  Silver nitrate applied to base for hemostasis.  Wet prep negative   Assessment/Plan:  37 y.o. G1P0010   1. Primary female infertility History of a complete spontaneous abortion in the first trimester April 2017.  History of left salpingo-oophorectomy with a diagnosis of stage IV endometriosis.  Advanced maternal age at 37 year old.  Decision to repeat  hysterosalpingography to document the patency of the right tube.  If patent, patient prefers attempting Clomid stimulation with ultrasound follicle/Ovidrel and intrauterine inseminations with husband's sperm wash for 3 cycles.  We will organize a referral to fertility with Dr. Kerin Perna at 3 months if intrauterine inseminations do not succeed, to proceed with IVF.  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  Patient and husband agree with plan. - Anti mullerian hormone  2. History of endometriosis History of robotic left salpingo-oophorectomy in August 2016.  Pathology revealed a left ovarian endometrioma.  Stage IV endometriosis.  Hysterosalpingography showing a patent right tube in October 2016.  3. Vaginal discharge Normal wet prep. - WET PREP FOR [place], YEAST, CLUE  4. Cervical polyp Small cervical polyp removed without difficulty.  Specimen sent to pathology. - Pathology Report  Counseling on above issues more than 50% for 25 minutes.  Princess Bruins MD, 9:54 AM 02/20/2017

## 2017-02-20 NOTE — Patient Instructions (Signed)
1. Primary female infertility History of a complete spontaneous abortion in the first trimester April 2017.  History of left salpingo-oophorectomy with a diagnosis of stage IV endometriosis.  Advanced maternal age at 37 year old.  Decision to repeat hysterosalpingography to document the patency of the right tube.  If patent, patient prefers attempting Clomid stimulation with ultrasound follicle/Ovidrel and intrauterine inseminations with husband's sperm wash for 3 cycles.  We will organize a referral to fertility with Dr. Kerin Perna at 3 months if intrauterine inseminations do not succeed, to proceed with IVF.  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  Patient and husband agree with plan. - Anti mullerian hormone  2. History of endometriosis History of robotic left salpingo-oophorectomy in August 2016.  Pathology revealed a left ovarian endometrioma.  Stage IV endometriosis.  Hysterosalpingography showing a patent right tube in October 2016.  3. Vaginal discharge Normal wet prep. - WET PREP FOR [place], YEAST, CLUE  4. Cervical polyp Small cervical polyp removed without difficulty.  Specimen sent to pathology. - Pathology Report  Marie Hill, it was a pleasure meeting you and your husband today!  I will inform you of your result as soon as it is available.

## 2017-02-22 LAB — TISSUE SPECIMEN

## 2017-02-22 LAB — PATHOLOGY

## 2017-02-23 ENCOUNTER — Telehealth: Payer: Self-pay | Admitting: *Deleted

## 2017-02-23 DIAGNOSIS — N979 Female infertility, unspecified: Secondary | ICD-10-CM

## 2017-02-23 LAB — ANTI-MULLERIAN HORMONE (AMH), FEMALE: ANTI-MULLERIAN HORMONES(AMH),FEMALE: 1.34 ng/mL (ref 0.18–5.68)

## 2017-02-23 NOTE — Telephone Encounter (Signed)
I left message for pt to call to confirm she is aware to call me on day 1 of cycle.

## 2017-02-23 NOTE — Telephone Encounter (Signed)
Pt informed to call on day 1 of cycle.

## 2017-02-23 NOTE — Telephone Encounter (Signed)
-----   Message from Princess Bruins, MD sent at 02/20/2017 10:29 AM EST ----- Regarding: HSG/Refer to Fertility Please organize Hysterosalpingography after next period.  Doxy day before, day of and day after procedure.  We will do IUI x 3 cycles if tube is open.  Please organize referral to Fertility Dr Carmela Rima in 3-4 months so that she is ready for next step if IUI doesn't work.

## 2017-03-07 MED ORDER — DOXYCYCLINE HYCLATE 50 MG PO CAPS: ORAL_CAPSULE | ORAL | 0 refills | Status: DC

## 2017-03-07 NOTE — Telephone Encounter (Signed)
Patient called back and cycle started today, 03/13/17 2 8:00am at [facility] hospital. Rx sent for doxy twice daily starting day before procedure.

## 2017-03-13 ENCOUNTER — Ambulatory Visit (HOSPITAL_COMMUNITY)
Admission: RE | Admit: 2017-03-13 | Discharge: 2017-03-13 | Disposition: A | Discharge status: Home / Self Care | Payer: BLUE CROSS/BLUE SHIELD | Source: Ambulatory Visit | Attending: Obstetrics & Gynecology | Admitting: Obstetrics & Gynecology

## 2017-03-13 ENCOUNTER — Telehealth: Payer: Self-pay

## 2017-03-13 DIAGNOSIS — N979 Female infertility, unspecified: Secondary | ICD-10-CM | POA: Insufficient documentation

## 2017-03-13 DIAGNOSIS — Z9079 Acquired absence of other genital organ(s): Secondary | ICD-10-CM | POA: Insufficient documentation

## 2017-03-13 MED ORDER — IOPAMIDOL (ISOVUE-300) INJECTION 61%
30.0000 mL | Freq: Once | INTRAVENOUS | Status: AC | PRN
  Administered 2017-03-13: 5 mL

## 2017-03-13 NOTE — Telephone Encounter (Signed)
-----   Message from Princess Bruins, MD sent at 03/13/2017  9:57 AM EST ----- HSG:  Normal uterine cavity and Patent Right fallopian tube.  S/P Left Salpingectomy.

## 2017-03-13 NOTE — Telephone Encounter (Signed)
Patient informed of result. She asked did she need to come back to see you as per last discussion she would like to proceed with Clomid as discussed.  02/20/17 "1. Primary female infertility History of a complete spontaneous abortion in the first trimester April 2017.  History of left salpingo-oophorectomy with a diagnosis of stage IV endometriosis.  Advanced maternal age at 37 year old.  Decision to repeat hysterosalpingography to document the patency of the right tube.  If patent, patient prefers attempting Clomid stimulation with ultrasound follicle/Ovidrel and intrauterine inseminations with husband's sperm wash for 3 cycles.  We will organize a referral to fertility with Dr. Kerin Perna at 3 months if intrauterine inseminations do not succeed, to proceed with IVF.  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  Patient and husband agree with plan. - Anti mullerian hormone

## 2017-03-13 NOTE — Telephone Encounter (Signed)
Yes can start Clomiphen 50 mg 1 tab per mouth daily from day #3 to day #7 of next cycle.  Schedule [place] Follicle study on day 48-18.  Will then do Ovidrel injection and Sperm wash/IUI the day after the Ovidrel.

## 2017-03-14 NOTE — Telephone Encounter (Signed)
I called patient and let her know that the lady that handles this is going to check on her insurance and said she will be calling her later this week.

## 2017-03-20 MED ORDER — CHORIOGONADOTROPIN ALFA 250 MCG/0.5ML ~~LOC~~ INJ: 250.0000 ug | INJECTION | Freq: Once | SUBCUTANEOUS | 0 refills | Status: AC

## 2017-03-20 MED ORDER — CLOMIPHENE CITRATE 50 MG PO TABS: 50.0000 mg | ORAL_TABLET | Freq: Every day | ORAL | 3 refills | Status: DC

## 2017-03-20 NOTE — Telephone Encounter (Signed)
I spoke with patient regarding infertility cycle.  Her insurance covers three cycles only.  Went over cost with patient.  Will call me with Day 1. Will do Clomid 50mg  D3-7, [place] D11-12, Ovidrel release then Sperm Washing/IUI.  Rx's sent to [place] [facility]

## 2017-04-14 ENCOUNTER — Ambulatory Visit: Payer: BLUE CROSS/BLUE SHIELD | Admitting: Physician Assistant

## 2017-04-14 ENCOUNTER — Encounter: Payer: Self-pay | Admitting: Physician Assistant

## 2017-04-14 ENCOUNTER — Other Ambulatory Visit: Payer: Self-pay

## 2017-04-14 VITALS — BP 116/74 | HR 80 | Temp 98.5°F | Resp 18 | Ht 61.5 in | Wt 131.8 lb

## 2017-04-14 DIAGNOSIS — H66015 Acute suppurative otitis media with spontaneous rupture of ear drum, recurrent, left ear: Secondary | ICD-10-CM | POA: Diagnosis not present

## 2017-04-14 MED ORDER — AMOXICILLIN 875 MG PO TABS: 875.0000 mg | ORAL_TABLET | Freq: Two times a day (BID) | ORAL | 0 refills | Status: AC

## 2017-04-14 NOTE — Progress Notes (Signed)
04/14/2017 4:15 PM   DOB: 03-30-1980 / MRN: [id number]  SUBJECTIVE:  Marie Hill is a 37 y.o. female presenting for acute change in hearing that started yesterday.  Reports that the ear was hurting for the last 3-4 days.  She has a history of otitis media about the left side.  She reports a change in hearing.  She feels she is getting worse.  She denies fever.  She has No Known Allergies.   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    She  reports that  has never smoked. she has never used smokeless tobacco. She reports that she drinks alcohol. She reports that she does not use drugs. She  reports that she currently engages in sexual activity and has had partners who are Female. She reports using the following method of birth control/protection: None. The patient  has a past surgical history that includes Robotic assisted laparoscopic ovarian cystectomy (N/A, 09/29/2014) and Laparoscopic unilateral salpingo oophorectomy (N/A, 09/29/2014).  Her family history includes Hyperlipidemia in her father and mother; Hypertension in her mother.  ROS As per HPI otherwise negative The problem list and medications were reviewed and updated by myself where necessary and exist elsewhere in the encounter.   OBJECTIVE:  BP 116/74   Pulse 80   Temp 98.5 F (36.9 C) (Oral)   Resp 18   Ht 5' 1.5" (1.562 m)   Wt 131 lb 12.8 oz (59.8 kg)   LMP 04/06/2017   SpO2 98%   BMI 24.50 kg/m   Physical Exam  Constitutional: She is active.  Non-toxic appearance.  HENT:  Right Ear: Hearing, tympanic membrane, external ear and ear canal normal.  Left Ear: Tympanic membrane, external ear and ear canal normal. No tenderness. Decreased hearing is noted.  Ears:  Nose: Nose normal. Right sinus exhibits no maxillary sinus tenderness and no frontal sinus tenderness. Left sinus  exhibits no maxillary sinus tenderness and no frontal sinus tenderness.  Mouth/Throat: Uvula is midline, oropharynx is clear and moist and mucous membranes are normal. Mucous membranes are not dry. No oropharyngeal exudate, posterior oropharyngeal edema or tonsillar abscesses.  Cardiovascular: Normal rate.  Pulmonary/Chest: Effort normal. No tachypnea.  Lymphadenopathy:       Head (right side): No submandibular and no tonsillar adenopathy present.       Head (left side): No submandibular and no tonsillar adenopathy present.    She has no cervical adenopathy.  Neurological: She is alert.  Skin: Skin is warm and dry. She is not diaphoretic. No pallor.    No results found for this or any previous visit (from the past 72 hour(s)).  No results found.  ASSESSMENT AND PLAN:  Marie Hill was seen today for ear pain.  Diagnoses and all orders for this visit:  Recurrent acute suppurative otitis media with spontaneous rupture of left tympanic membrane: We will see her back in 10 days for recheck of the rupture.  Other orders -     amoxicillin (AMOXIL) 875 MG tablet; Take 1 tablet (875 mg total) by mouth 2 (two) times daily for 10 days.    The patient is advised to call or return to clinic if she does not see an improvement in symptoms, or to seek the care of the closest emergency department if she worsens with the above plan.   Philis Fendt, MHS, PA-C Primary Care at [facility] 04/14/2017 4:15 PM

## 2017-04-14 NOTE — Patient Instructions (Addendum)
  Come back in one week.     IF you received an x-ray today, you will receive an invoice from [facility]. Please contact [facility] at [phone] with questions or concerns regarding your invoice.   IF you received labwork today, you will receive an invoice from [facility]. Please contact [facility] at [phone] with questions or concerns regarding your invoice.   Our billing staff will not be able to assist you with questions regarding bills from these companies.  You will be contacted with the lab results as soon as they are available. The fastest way to get your results is to activate your My Chart account. Instructions are located on the last page of this paperwork. If you have not heard from [place] regarding the results in 2 weeks, please contact this office.

## 2017-04-23 ENCOUNTER — Ambulatory Visit: Payer: BLUE CROSS/BLUE SHIELD | Admitting: Physician Assistant

## 2017-04-26 ENCOUNTER — Encounter: Payer: Self-pay | Admitting: Physician Assistant

## 2017-04-26 ENCOUNTER — Ambulatory Visit: Payer: BLUE CROSS/BLUE SHIELD | Admitting: Physician Assistant

## 2017-04-26 VITALS — BP 117/75 | HR 94 | Temp 98.7°F | Resp 16 | Ht 61.0 in | Wt 135.0 lb

## 2017-04-26 DIAGNOSIS — H6592 Unspecified nonsuppurative otitis media, left ear: Secondary | ICD-10-CM

## 2017-04-26 MED ORDER — FLUTICASONE PROPIONATE 50 MCG/ACT NA SUSP: 2.0000 | Freq: Every day | NASAL | 6 refills | Status: DC

## 2017-04-26 MED ORDER — AMOXICILLIN-POT CLAVULANATE 875-125 MG PO TABS: 1.0000 | ORAL_TABLET | Freq: Two times a day (BID) | ORAL | 0 refills | Status: DC

## 2017-04-26 NOTE — Patient Instructions (Signed)
     IF you received an x-ray today, you will receive an invoice from [facility]. Please contact [facility] at [phone] with questions or concerns regarding your invoice.   IF you received labwork today, you will receive an invoice from [facility]. Please contact [facility] at [phone] with questions or concerns regarding your invoice.   Our billing staff will not be able to assist you with questions regarding bills from these companies.  You will be contacted with the lab results as soon as they are available. The fastest way to get your results is to activate your My Chart account. Instructions are located on the last page of this paperwork. If you have not heard from us regarding the results in 2 weeks, please contact this office.     

## 2017-04-26 NOTE — Progress Notes (Signed)
04/27/2017 2:52 PM   DOB: 1980-05-26 / MRN: [id number]  SUBJECTIVE:  Marie Hill is a 37 y.o. female presenting for recheck of left ear infection.  He tells me she is not any better after taking amoxicillin.  She does desire pregnancy.  She would like a referral to ENT.  She is open to trying other antibiotics while she is waiting from referral.  Tells me that in the past she had did have to go to [facility] for this problem as it was recalcitrant to basic p.o. therapy.  She has No Known Allergies.   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    She  reports that  has never smoked. she has never used smokeless tobacco. She reports that she drinks alcohol. She reports that she does not use drugs. She  reports that she currently engages in sexual activity and has had partners who are Female. She reports using the following method of birth control/protection: None. The patient  has a past surgical history that includes Robotic assisted laparoscopic ovarian cystectomy (N/A, 09/29/2014) and Laparoscopic unilateral salpingo oophorectomy (N/A, 09/29/2014).  Her family history includes Hyperlipidemia in her father and mother; Hypertension in her mother.  Review of Systems  Constitutional: Negative for chills, diaphoresis and fever.  HENT: Positive for ear discharge, ear pain and hearing loss. Negative for tinnitus.   Eyes: Negative.   Gastrointestinal: Negative for nausea.  Skin: Negative for rash.  Neurological: Negative for dizziness, sensory change, speech change, focal weakness and headaches.    The problem list and medications were reviewed and updated by myself where necessary and exist elsewhere in the encounter.   OBJECTIVE:  BP 117/75   Pulse 94   Temp 98.7 F (37.1 C) (Oral)   Resp 16   Ht 5\' 1"  (1.549 m)   Wt 135 lb (61.2 kg)   LMP  04/06/2017   SpO2 100%   BMI 25.51 kg/m   Physical Exam  Constitutional: She is active.  Non-toxic appearance.  HENT:  Right Ear: Hearing, tympanic membrane, external ear and ear canal normal.  Left Ear: Hearing, tympanic membrane and ear canal normal.  Ears:  Nose: Nose normal. Right sinus exhibits no maxillary sinus tenderness and no frontal sinus tenderness. Left sinus exhibits no maxillary sinus tenderness and no frontal sinus tenderness.  Mouth/Throat: Uvula is midline, oropharynx is clear and moist and mucous membranes are normal. Mucous membranes are not dry. No oropharyngeal exudate, posterior oropharyngeal edema or tonsillar abscesses.  Cardiovascular: Normal rate.  Pulmonary/Chest: Effort normal. No tachypnea.  Lymphadenopathy:       Head (right side): No submandibular and no tonsillar adenopathy present.       Head (left side): No submandibular and no tonsillar adenopathy present.    She has no cervical adenopathy.  Neurological: She is alert.  Skin: Skin is warm and dry. She is not diaphoretic. No pallor.    No results found for this or any previous visit (from the past 72 hour(s)).  No results found.  ASSESSMENT AND PLAN:  Marie Hill was seen today for ear pain.  Diagnoses and all orders for this visit:  Left otitis media with effusion -     amoxicillin-clavulanate (AUGMENTIN) 875-125 MG tablet; Take 1 tablet by mouth 2 (two) times daily. -     Ambulatory referral to ENT -     fluticasone (FLONASE) 50 MCG/ACT  nasal spray; Place 2 sprays into both nostrils daily.    The patient is advised to call or return to clinic if she does not see an improvement in symptoms, or to seek the care of the closest emergency department if she worsens with the above plan.   Philis Fendt, MHS, PA-C [facility] 04/27/2017 2:52 PM

## 2017-05-08 ENCOUNTER — Other Ambulatory Visit: Payer: Self-pay | Admitting: Obstetrics & Gynecology

## 2017-05-08 DIAGNOSIS — N8300 Follicular cyst of ovary, unspecified side: Secondary | ICD-10-CM

## 2017-05-15 ENCOUNTER — Encounter: Payer: Self-pay | Admitting: Obstetrics & Gynecology

## 2017-05-15 ENCOUNTER — Ambulatory Visit (INDEPENDENT_AMBULATORY_CARE_PROVIDER_SITE_OTHER): Payer: BLUE CROSS/BLUE SHIELD

## 2017-05-15 ENCOUNTER — Ambulatory Visit: Payer: BLUE CROSS/BLUE SHIELD | Admitting: Obstetrics & Gynecology

## 2017-05-15 VITALS — BP 124/78

## 2017-05-15 DIAGNOSIS — N8300 Follicular cyst of ovary, unspecified side: Secondary | ICD-10-CM | POA: Diagnosis not present

## 2017-05-15 DIAGNOSIS — N979 Female infertility, unspecified: Secondary | ICD-10-CM

## 2017-05-15 NOTE — Patient Instructions (Signed)
1. Primary female infertility Dominant follicle measuring 35.7 mm today on day #11 of a Clomid cycle.  Will give herself an injection of Ovidrel tomorrow morning.  And will follow up here for an IUI with sperm wash.  Organizing for the sperm wash at the fertility clinic.  [name], good seeing you today!

## 2017-05-15 NOTE — Progress Notes (Signed)
    Marie Hill 1980/07/03 [id number]        36 y.o.  [id number]   RP: [place] follicles day #89 of Clomid cycle  HPI: LMP 05/05/2017.  Day #11 of cycle.  No pelvic pain.  Took Clomid 50 day 3-7th.     OB History  Gravida Para Term Preterm AB Living  1 0 0 0 1 0  SAB TAB Ectopic Multiple Live Births  1 0 0 0      # Outcome Date GA Lbr Len/2nd Weight Sex Delivery Anes PTL Lv  1 SAB             Past medical history,surgical history, problem list, medications, allergies, family history and social history were all reviewed and documented in the EPIC chart.   Directed ROS with pertinent positives and negatives documented in the history of present illness/assessment and plan.  Exam:  Vitals:   05/15/17 1504  BP: 124/78   General appearance:  Normal  Pelvic US today: T/V images.  Uterus anteverted and homogeneous measuring 6.72 x 5.75 x 3.84 cm.  Endometrial lining tri-layered measuring 6.9 mm.  Right ovary with follicles measured at 21.1 mm, 10.9 mm and 11.5 mm.  Status post left salpingo-oophorectomy with a small left adnexal cystic tubular area measuring 1.3 x 0.9 cm and with negative color flow Doppler.  No free fluid in the posterior cul-de-sac.    Assessment/Plan:  37 y.o. G1P0010   1. Primary female infertility Dominant follicle measuring 94.1 mm today on day #11 of a Clomid cycle.  Will give herself an injection of Ovidrel tomorrow morning.  And will follow up here for an IUI with sperm wash.  Organizing for the sperm wash at the fertility clinic.  Counseling and coordination of care on above issues more than 50% for 15 minutes.  Princess Bruins MD, 3:05 PM 05/15/2017

## 2017-05-16 ENCOUNTER — Telehealth: Payer: Self-pay | Admitting: *Deleted

## 2017-05-16 NOTE — Telephone Encounter (Signed)
Megan called from [place] office stating sperm prep appointment will need to be at 12:00 and note 2:00pm . I called pt and informed her with this information.

## 2017-05-16 NOTE — Telephone Encounter (Signed)
Marie Hill called [place] [facility] to schedule sperm prep for pt husband and faxed referral form to office. Pt is scheduled on 05/17/17 @ 2:00pm at this location

## 2017-05-17 ENCOUNTER — Encounter: Payer: Self-pay | Admitting: Obstetrics & Gynecology

## 2017-05-17 ENCOUNTER — Ambulatory Visit (INDEPENDENT_AMBULATORY_CARE_PROVIDER_SITE_OTHER): Payer: BLUE CROSS/BLUE SHIELD | Admitting: Obstetrics & Gynecology

## 2017-05-17 DIAGNOSIS — H9212 Otorrhea, left ear: Secondary | ICD-10-CM | POA: Insufficient documentation

## 2017-05-17 DIAGNOSIS — N979 Female infertility, unspecified: Secondary | ICD-10-CM | POA: Diagnosis not present

## 2017-05-17 DIAGNOSIS — H7202 Central perforation of tympanic membrane, left ear: Secondary | ICD-10-CM | POA: Insufficient documentation

## 2017-05-17 DIAGNOSIS — H9012 Conductive hearing loss, unilateral, left ear, with unrestricted hearing on the contralateral side: Secondary | ICD-10-CM | POA: Insufficient documentation

## 2017-05-17 NOTE — Patient Instructions (Signed)
1. Primary female infertility Easy IUI with sperm wash.  No complication and well-tolerated.  Post IUI counseling done.  Will do a HPT if late menses and a sPT here to confirm if positive.  PNVs to continue.  [name], good seeing you today!

## 2017-05-17 NOTE — Progress Notes (Signed)
    Marie Hill 08-Jan-1981 [id number]        37 y.o.  G1P0010   RP: IUI with sperm wash  HPI:  Clomid/US follicles/Ovidrel Cycle.  Ovidrel received yesterday morning.     OB History  Gravida Para Term Preterm AB Living  1 0 0 0 1 0  SAB TAB Ectopic Multiple Live Births  1 0 0 0      # Outcome Date GA Lbr Len/2nd Weight Sex Delivery Anes PTL Lv  1 SAB             Past medical history,surgical history, problem list, medications, allergies, family history and social history were all reviewed and documented in the EPIC chart.   Directed ROS with pertinent positives and negatives documented in the history of present illness/assessment and plan.  Exam:  There were no vitals filed for this visit. General appearance:  Normal  Sperm wash:  Pre-wash:  39% motile, Total Motile Sperm 46.41 M.  Post-wash: 65% motile, Total Motile Sperm 9.3 M  ID and result of sperm wash verified.  Patient informed of results.  Verbal consent obtained.  IUI:  Vulva normal.  Speculum:  Cervix/vagina/secretions normal.  Easy IUI with sperm wash specimen.   Assessment/Plan:  37 y.o. G1P0010   1. Primary female infertility Easy IUI with sperm wash.  No complication and well-tolerated.  Post IUI counseling done.  Will do a HPT if late menses and a sPT here to confirm if positive.  PNVs to continue.  Counseling on above issues and coordination of care >50% x 10 minutes  Princess Bruins MD, 2:00 PM 05/17/2017

## 2017-06-05 ENCOUNTER — Telehealth: Payer: Self-pay | Admitting: *Deleted

## 2017-06-05 DIAGNOSIS — N979 Female infertility, unspecified: Secondary | ICD-10-CM

## 2017-06-05 NOTE — Telephone Encounter (Signed)
Patient called to follow up from [place] on 05/17/17 stating her cycle started on 06/04/17 would like to know the next step? Please advise

## 2017-06-06 MED ORDER — CLOMIPHENE CITRATE 50 MG PO TABS: 50.0000 mg | ORAL_TABLET | Freq: Every day | ORAL | 0 refills | Status: DC

## 2017-06-06 NOTE — Telephone Encounter (Signed)
Left detailed message on pt cell per DPR access, Rx sent and order placed for ultrasound.

## 2017-06-06 NOTE — Telephone Encounter (Signed)
Clomid 50 mg 1 tab PO day 3-7th to start today.  [place] follicles around day #42.

## 2017-07-05 ENCOUNTER — Encounter: Payer: Self-pay | Admitting: Family Medicine

## 2017-07-10 ENCOUNTER — Other Ambulatory Visit: Payer: Self-pay | Admitting: *Deleted

## 2017-07-10 ENCOUNTER — Other Ambulatory Visit: Payer: Self-pay | Admitting: Obstetrics & Gynecology

## 2017-07-10 DIAGNOSIS — N979 Female infertility, unspecified: Secondary | ICD-10-CM

## 2017-07-11 ENCOUNTER — Telehealth: Payer: Self-pay | Admitting: *Deleted

## 2017-07-11 ENCOUNTER — Ambulatory Visit (HOSPITAL_COMMUNITY)
Admission: RE | Admit: 2017-07-11 | Discharge: 2017-07-11 | Disposition: A | Discharge status: Home / Self Care | Payer: BLUE CROSS/BLUE SHIELD | Source: Ambulatory Visit | Attending: Obstetrics & Gynecology | Admitting: Obstetrics & Gynecology

## 2017-07-11 ENCOUNTER — Encounter: Payer: Self-pay | Admitting: Obstetrics & Gynecology

## 2017-07-11 ENCOUNTER — Ambulatory Visit: Payer: BLUE CROSS/BLUE SHIELD | Admitting: Obstetrics & Gynecology

## 2017-07-11 VITALS — BP 130/84

## 2017-07-11 DIAGNOSIS — N979 Female infertility, unspecified: Secondary | ICD-10-CM

## 2017-07-11 DIAGNOSIS — Z90721 Acquired absence of ovaries, unilateral: Secondary | ICD-10-CM | POA: Diagnosis not present

## 2017-07-11 MED ORDER — CHORIOGONADOTROPIN ALFA 250 MCG/0.5ML ~~LOC~~ INJ: 250.0000 ug | INJECTION | Freq: Once | SUBCUTANEOUS | 0 refills | Status: AC

## 2017-07-11 NOTE — Telephone Encounter (Signed)
Patient scheduled for sperm washing on 07/13/17 @ 9am at [facility], referral/order faxed, aware the cost is $200, aware to come to office after testing.

## 2017-07-11 NOTE — Telephone Encounter (Signed)
-----   Message from Sinclair Grooms sent at 07/11/2017 10:52 AM EDT ----- Regarding: RX Please call in  injection of Ovidrel to [facility]

## 2017-07-11 NOTE — Progress Notes (Signed)
    Marie Hill 09/10/1980 [id number]        37 y.o.  G1P0010 Married  RP: Primary infertility management/Pelvic US follicle counseling  HPI: Day # 11 of a Clomid cycle.  No complaint.   OB History  Gravida Para Term Preterm AB Living  1 0 0 0 1 0  SAB TAB Ectopic Multiple Live Births  1 0 0 0      # Outcome Date GA Lbr Len/2nd Weight Sex Delivery Anes PTL Lv  1 SAB             Past medical history,surgical history, problem list, medications, allergies, family history and social history were all reviewed and documented in the EPIC chart.   Directed ROS with pertinent positives and negatives documented in the history of present illness/assessment and plan.  Exam:  Vitals:   07/11/17 1419  BP: 130/84   General appearance:  Normal  Pelvic US today at [facility]:   Uterus: No fibroids or other mass visualized. Endometrium: Thickness: 5 mm.  No focal abnormality visualized. Right ovary:  Follicles measuring 63WG or greater: 2, largest measuring 16 x 12 x 14 mm Left ovary: Surgically absent.  No abnormal free fluid    Assessment/Plan:  37 y.o. G1P0010   1. Primary female infertility Ovulatory cycle on Clomid with a dominant follicle on the right ovary measuring 16 mm today.  Pelvic ultrasound done today at [facility].  Ultrasound findings reviewed with patient.  Patient will go ahead and give herself the Ovidrel injection tomorrow and follow-up on Friday for the sperm wash/intrauterine insemination.  Ovidrel prescription sent to pharmacy.  Appointment for sperm wash at the fertility clinic organized.  Counseling on above issues and coordination of care more than 50% for 15 minutes.  Princess Bruins MD, 2:23 PM 07/11/2017

## 2017-07-11 NOTE — Telephone Encounter (Signed)
-----   Message from Princess Bruins, MD sent at 07/11/2017  2:42 PM EDT ----- Regarding: Sperm Wash Patient will give herself the Ovidrel injection on Thursday am.  Please schedule Sperm Wash Friday am 5/31st and then visit with me for IUI.

## 2017-07-11 NOTE — Telephone Encounter (Signed)
Rx called in 

## 2017-07-11 NOTE — Patient Instructions (Signed)
1. Primary female infertility Ovulatory cycle on Clomid with a dominant follicle on the right ovary measuring 16 mm today.  Pelvic ultrasound done today at [facility].  Ultrasound findings reviewed with patient.  Patient will go ahead and give herself the Ovidrel injection tomorrow and follow-up on Friday for the sperm wash/intrauterine insemination.  Ovidrel prescription sent to pharmacy.  Appointment for sperm wash at the fertility clinic organized.  [name], good seeing you today!

## 2017-07-13 ENCOUNTER — Ambulatory Visit: Payer: BLUE CROSS/BLUE SHIELD | Admitting: Obstetrics & Gynecology

## 2017-07-13 ENCOUNTER — Encounter: Payer: Self-pay | Admitting: Obstetrics & Gynecology

## 2017-07-13 VITALS — BP 124/86

## 2017-07-13 DIAGNOSIS — N979 Female infertility, unspecified: Secondary | ICD-10-CM | POA: Diagnosis not present

## 2017-07-13 NOTE — Progress Notes (Signed)
    Rozanna Cormany [birth year] [id number]        36 y.o.  G1P0010 day #13 of Clomid cycle  RP: Primary infertility for IUI  HPI: Clomid cycle with a good follicle per ultrasound.  Received Ovidrel yesterday.  Day #13 today, for IUI with sperm wash.   OB History  Gravida Para Term Preterm AB Living  1 0 0 0 1 0  SAB TAB Ectopic Multiple Live Births  1 0 0 0      # Outcome Date GA Lbr Len/2nd Weight Sex Delivery Anes PTL Lv  1 SAB             Past medical history,surgical history, problem list, medications, allergies, family history and social history were all reviewed and documented in the EPIC chart.   Directed ROS with pertinent positives and negatives documented in the history of present illness/assessment and plan.  Exam:  Vitals:   07/13/17 1051  BP: 124/86   General appearance:  Normal  Sperm wash:  Identification verified.  Result:  Post wash:  Volume 0.5 mL, Concentration 19 M/mL, Motility 79%.  Gynecologic exam: Vulva normal.  IUI sperm wash verbal consent obtained.  Speculum:  Cervix/Vaginal normal.  Easy IUI.  Well tolerated, no Cx.   Assessment/Plan:  37 y.o. G1P0010   1. Primary female infertility IUI sperm wash done without difficulty or complication.  Patient is informed of mildly low concentration but good motility of the sperm wash.  Recommend natural sexual activity later today and tomorrow.  Counseling on above issues and coordination of care more than 50% for 15 minutes.  Princess Bruins MD, 10:55 AM 07/13/2017

## 2017-07-15 ENCOUNTER — Encounter: Payer: Self-pay | Admitting: Obstetrics & Gynecology

## 2017-07-15 NOTE — Patient Instructions (Signed)
1. Primary female infertility IUI sperm wash done without difficulty or complication.  Patient is informed of mildly low concentration but good motility of the sperm wash.  Recommend natural sexual activity later today and tomorrow.  [name], good seeing you today!

## 2017-12-28 ENCOUNTER — Encounter: Payer: Self-pay | Admitting: Obstetrics & Gynecology

## 2017-12-28 ENCOUNTER — Ambulatory Visit: Payer: BLUE CROSS/BLUE SHIELD | Admitting: Obstetrics & Gynecology

## 2017-12-28 VITALS — BP 124/80

## 2017-12-28 DIAGNOSIS — Z3201 Encounter for pregnancy test, result positive: Secondary | ICD-10-CM | POA: Diagnosis not present

## 2017-12-28 DIAGNOSIS — Z3200 Encounter for pregnancy test, result unknown: Secondary | ICD-10-CM

## 2017-12-28 DIAGNOSIS — N912 Amenorrhea, unspecified: Secondary | ICD-10-CM

## 2017-12-28 LAB — PREGNANCY, URINE: PREG TEST UR: POSITIVE — AB

## 2017-12-28 NOTE — Progress Notes (Signed)
    Marie Hill [birth year] [id number]        37 y.o.  G1P0010  LMP 11/08/2017  RP: Positive home pregnancy test  HPI: LMP 11/08/2017 with a positive HPT on 12/13/2017.  EDD 08/16/2018.  No pelvic pain.  No vaginal bleeding.  No nausea and vomiting.  Taking prenatal vitamins.  Healthy nutrition.  Regular physical activity.   OB History  Gravida Para Term Preterm AB Living  1 0 0 0 1 0  SAB TAB Ectopic Multiple Live Births  1 0 0 0      # Outcome Date GA Lbr Len/2nd Weight Sex Delivery Anes PTL Lv  1 SAB             Past medical history,surgical history, problem list, medications, allergies, family history and social history were all reviewed and documented in the EPIC chart.   Directed ROS with pertinent positives and negatives documented in the history of present illness/assessment and plan.  Exam:  Vitals:   12/28/17 1029  BP: 124/80   General appearance:  Normal  Abdomen: Normal  Gynecologic exam: Vulva normal.  Bimanual exam: Uterus anteverted, normal volume, mobile, nontender.  Cervix long, firm and closed.  No adnexal mass felt, nontender.  Normal vaginal secretions.  No bleeding.  UPT positive   Assessment/Plan:  37 y.o. G1P0010   1. Amenorrhea Early pregnancy. - Pregnancy, urine  2. Encounter for confirmation of pregnancy test result with physical examination Urine pregnancy test positive.  Spontaneous pregnancy with regular menstrual periods.  LMP 11/08/2017.  7 1/[redacted] wk gestation.  EDD 08/16/2018.  Declines first trimester ultrasound dating and viability.  Continue with healthy nutrition and regular physical activity at the same level.  Continue with prenatal vitamins.  Patient will verify her insurance coverage and decide the practice and physician with whom she wants to establish obstetrical care.  Other orders - Prenatal Vit-Fe Fumarate-FA (PRENATAL PO); Take 1 tablet by mouth daily.  Counseling on above issues and coordination of care more than 50% for 25  minutes.  Princess Bruins MD, 10:35 AM 12/28/2017

## 2017-12-29 ENCOUNTER — Encounter: Payer: Self-pay | Admitting: Obstetrics & Gynecology

## 2017-12-29 NOTE — Patient Instructions (Signed)
1. Amenorrhea Early pregnancy. - Pregnancy, urine  2. Encounter for confirmation of pregnancy test result with physical examination Urine pregnancy test positive.  Spontaneous pregnancy with regular menstrual periods.  LMP 11/08/2017.  7 1/[redacted] wk gestation.  EDD 08/16/2018.  Declines first trimester ultrasound dating and viability.  Continue with healthy nutrition and regular physical activity at the same level.  Continue with prenatal vitamins.  Patient will verify her insurance coverage and decide the practice and physician with whom she wants to establish obstetrical care.  Other orders - Prenatal Vit-Fe Fumarate-FA (PRENATAL PO); Take 1 tablet by mouth daily.  Ryenn Howeth, it was a pleasure seeing you today!  All my congratulations!    Eating Plan for Pregnant Women While you are pregnant, your body will require additional nutrition to help support your growing baby. It is recommended that you consume:  150 additional calories each day during your first trimester.  300 additional calories each day during your second trimester.  300 additional calories each day during your third trimester.  Eating a healthy, well-balanced diet is very important for your health and for your baby's health. You also have a higher need for some vitamins and minerals, such as folic acid, calcium, iron, and vitamin D. What do I need to know about eating during pregnancy?  Do not try to lose weight or go on a diet during pregnancy.  Choose healthy, nutritious foods. Choose  of a sandwich with a glass of milk instead of a candy bar or a high-calorie sugar-sweetened beverage.  Limit your overall intake of foods that have "empty calories." These are foods that have little nutritional value, such as sweets, desserts, candies, sugar-sweetened beverages, and fried foods.  Eat a variety of foods, especially fruits and vegetables.  Take a prenatal vitamin to help meet the additional needs during pregnancy,  specifically for folic acid, iron, calcium, and vitamin D.  Remember to stay active. Ask your health care provider for exercise recommendations that are specific to you.  Practice good food safety and cleanliness, such as washing your hands before you eat and after you prepare raw meat. This helps to prevent foodborne illnesses, such as listeriosis, that can be very dangerous for your baby. Ask your health care provider for more information about listeriosis. What does 150 extra calories look like? Healthy options for an additional 150 calories each day could be any of the following:  Plain low-fat yogurt (6-8 oz) with  cup of berries.  1 apple with 2 teaspoons of peanut butter.  Cut-up vegetables with  cup of hummus.  Low-fat chocolate milk (8 oz or 1 cup).  1 string cheese with 1 medium orange.   of a peanut butter and jelly sandwich on whole-wheat bread (1 tsp of peanut butter).  For 300 calories, you could eat two of those healthy options each day. What is a healthy amount of weight to gain? The recommended amount of weight for you to gain is based on your pre-pregnancy BMI. If your pre-pregnancy BMI was:  Less than 18 (underweight), you should gain 28-40 lb.  18-24.9 (normal), you should gain 25-35 lb.  25-29.9 (overweight), you should gain 15-25 lb.  Greater than 30 (obese), you should gain 11-20 lb.  What if I am having twins or multiples? Generally, pregnant women who will be having twins or multiples may need to increase their daily calories by 300-600 calories each day. The recommended range for total weight gain is 25-54 lb, depending on your pre-pregnancy BMI.  Talk with your health care provider for specific guidance about additional nutritional needs, weight gain, and exercise during your pregnancy. What foods can I eat? Grains Any grains. Try to choose whole grains, such as whole-wheat bread, oatmeal, or brown rice. Vegetables Any vegetables. Try to eat a  variety of colors and types of vegetables to get a full range of vitamins and minerals. Remember to wash your vegetables well before eating. Fruits Any fruits. Try to eat a variety of colors and types of fruit to get a full range of vitamins and minerals. Remember to wash your fruits well before eating. Meats and Other Protein Sources Lean meats, including chicken, [place], fish, and lean cuts of beef, veal, or pork. Make sure that all meats are cooked to "well done." Tofu. Tempeh. Beans. Eggs. Peanut butter and other nut butters. Seafood, such as shrimp, crab, and lobster. If you choose fish, select types that are higher in omega-3 fatty acids, including salmon, herring, mussels, trout, sardines, and pollock. Make sure that all meats are cooked to food-safe temperatures. Dairy Pasteurized milk and milk alternatives. Pasteurized yogurt and pasteurized cheese. Cottage cheese. Sour cream. Beverages Water. Juices that contain 100% fruit juice or vegetable juice. Caffeine-free teas and decaffeinated coffee. Drinks that contain caffeine are okay to drink, but it is better to avoid caffeine. Keep your total caffeine intake to less than 200 mg each day (12 oz of coffee, tea, or soda) or as directed by your health care provider. Condiments Any pasteurized condiments. Sweets and Desserts Any sweets and desserts. Fats and Oils Any fats and oils. The items listed above may not be a complete list of recommended foods or beverages. Contact your dietitian for more options. What foods are not recommended? Vegetables Unpasteurized (raw) vegetable juices. Fruits Unpasteurized (raw) fruit juices. Meats and Other Protein Sources Cured meats that have nitrates, such as bacon, salami, and hotdogs. Luncheon meats, bologna, or other deli meats (unless they are reheated until they are steaming hot). Refrigerated pate, meat spreads from a meat counter, smoked seafood that is found in the refrigerated section of a store.  Raw fish, such as sushi or sashimi. High mercury content fish, such as tilefish, shark, swordfish, and king mackerel. Raw meats, such as tuna or beef tartare. Undercooked meats and poultry. Make sure that all meats are cooked to food-safe temperatures. Dairy Unpasteurized (raw) milk and any foods that have raw milk in them. Soft cheeses, such as feta, queso blanco, queso fresco, Brie, Camembert cheeses, blue-veined cheeses, and Panela cheese (unless it is made with pasteurized milk, which must be stated on the label). Beverages Alcohol. Sugar-sweetened beverages, such as sodas, teas, or energy drinks. Condiments Homemade fermented foods and drinks, such as pickles, sauerkraut, or kombucha drinks. (Store-bought pasteurized versions of these are okay.) Other Salads that are made in the store, such as ham salad, chicken salad, egg salad, tuna salad, and seafood salad. The items listed above may not be a complete list of foods and beverages to avoid. Contact your dietitian for more information. This information is not intended to replace advice given to you by your health care provider. Make sure you discuss any questions you have with your health care provider. Document Released: 11/14/2013 Document Revised: 07/08/2015 Document Reviewed: 07/15/2013 Elsevier Interactive Patient Education  [employer].

## 2018-04-04 DIAGNOSIS — E119 Type 2 diabetes mellitus without complications: Secondary | ICD-10-CM | POA: Insufficient documentation

## 2019-03-31 ENCOUNTER — Telehealth (INDEPENDENT_AMBULATORY_CARE_PROVIDER_SITE_OTHER): Payer: 59 | Admitting: Family Medicine

## 2019-03-31 ENCOUNTER — Other Ambulatory Visit: Payer: Self-pay

## 2019-03-31 DIAGNOSIS — N979 Female infertility, unspecified: Secondary | ICD-10-CM

## 2019-03-31 DIAGNOSIS — B349 Viral infection, unspecified: Secondary | ICD-10-CM

## 2019-03-31 DIAGNOSIS — E119 Type 2 diabetes mellitus without complications: Secondary | ICD-10-CM | POA: Diagnosis not present

## 2019-03-31 NOTE — Progress Notes (Signed)
Virtual Visit Note  I connected with patient on 03/31/19 at 542pm by video doximity and verified that I am speaking with the correct person using two identifiers. Marie Hill is currently located at home and patient is currently with them during visit. The provider, Rutherford Guys, MD is located in their office at time of visit.  I discussed the limitations, risks, security and privacy concerns of performing an evaluation and management service by telephone and the availability of in person appointments. I also discussed with the patient that there may be a patient responsible charge related to this service. The patient expressed understanding and agreed to proceed.   CC: several concerns  HPI ? She reports that 16 days ago she started feeling very tired, 3-4 days later she then lost her sense of taste and smell She has been having coughing but not SOB Her cough is worse at night, not able to sleep, has been using robitussin DM She has not been checking her temp but she feels that she has not had any fevers She feels she is getting 90% better Her sense of taste and smell have returned She never got tested for covid as she was sure she had it  [id number] 2 SAB, first trimester Was seeing infertility at [place], s/p lysis of adhesions, recommend IVF Patient reports her doctor is no longer in that practice, she would like to be referred to someone in [place]  DM2 - a1c 6.6 2 years ago Was taking metformin, has been wo for months Denies any polydipsia or polyuria   No Known Allergies  Prior to Admission medications   Medication Sig Start Date End Date Taking? Authorizing Provider  metFORMIN (GLUCOPHAGE) 500 MG tablet TAKE ONE TABLET BY MOUTH TWICE A DAY WITH MEALS 12/18/18   [provider]  Prenatal Vit-Fe Fumarate-FA (PRENATAL PO) Take 1 tablet by mouth daily.    [provider]    Past Medical History:  Diagnosis Date  . Dandruff   . 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 Glucose intolerance (impaired glucose tolerance)   . Hyperlipidemia   . Positive PPD, treated    states follow up chest x rays negative  . 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 LAPAROSCOPIC UNILATERAL SALPINGO OOPHERECTOMY N/A 09/29/2014   Procedure: LAPAROSCOPIC LEFT SALPINGO OOPHORECTOMY ADHESIOLYSIS ;  Surgeon: Everitt Amber, MD;  [facility];  Service: Gynecology;  Laterality: N/A;  . ROBOTIC ASSISTED LAPAROSCOPIC OVARIAN CYSTECTOMY N/A 09/29/2014   Procedure: SI ROBOTIC ASSISTED LAPAROSCOPIC OVARIAN CYSTECTOMY;  Surgeon: Everitt Amber, MD;  [facility]: [facility];  Service: Gynecology;  Laterality: N/A;    Social History   Tobacco Use  . Smoking status: Never Smoker  . Smokeless tobacco: Never Used  Substance Use Topics  . Alcohol use: Not Currently    Alcohol/week: 0.0 standard drinks    Family History  Problem Relation Age of Onset  . Hyperlipidemia Mother   . Hypertension Mother   . Hyperlipidemia Father     ROS Per hpi  Objective  Vitals as reported by the patient: none  GEN: AAOx3, NAD HEENT: [place]/AT, pupils are symmetrical, EOMI, non-icteric sclera Resp: breathing comfortably, speaking in full sentences Skin: no rashes noted, no pallor Psych: good eye contact, normal mood and affect   ASSESSMENT and PLAN  1. Type 2  diabetes mellitus without complication, without long-term current use of insulin ([place]) Checking labs, adjust meds as needed for now, defer further treatment to REI/obgyn once established  - Hemoglobin A1c; Future - Comprehensive metabolic panel; Future - Lipid panel; Future - TSH; Future  2. Infertility, female - Ambulatory referral to Endocrinology  3. Viral illness Most likely covid per symptomology. She has meet criteria for removal from self-isolation. Continue with supportive measures for known sequela   Other orders - metFORMIN (GLUCOPHAGE) 500 MG tablet; TAKE ONE TABLET BY MOUTH TWICE A DAY WITH MEALS  FOLLOW-UP: prn   The above assessment and management plan was discussed with the patient. The patient verbalized understanding of and has agreed to the management plan. Patient is aware to call the clinic if symptoms persist or worsen. Patient is aware when to return to the clinic for a follow-up visit. Patient educated on when it is appropriate to go to the emergency department.    I provided 27 minutes of non-face-to-face time during this encounter.  Rutherford Guys, MD Primary Care at [facility] [place], [place] [address] Ph.  [phone] Fax [phone]

## 2019-03-31 NOTE — Progress Notes (Signed)
Pt is wanting to follow up with the sx that she is having. She never tested for covid, however has been in quarantine for the past 16 days due to the sx of coughing and weakness. She is wanting to know when she will be able to come in and have labs and regular  ov to make sure pcp is well aware of her condition. Has not had metformin for a while now. She has also plans to get invitro, this has been postpones due to the virus

## 2019-04-02 ENCOUNTER — Other Ambulatory Visit: Payer: Self-pay

## 2019-04-02 ENCOUNTER — Ambulatory Visit: Payer: 59

## 2019-04-02 DIAGNOSIS — E119 Type 2 diabetes mellitus without complications: Secondary | ICD-10-CM

## 2019-04-03 LAB — HEMOGLOBIN A1C
Est. average glucose Bld gHb Est-mCnc: 200 mg/dL
Hgb A1c MFr Bld: 8.6 % — ABNORMAL HIGH (ref 4.8–5.6)

## 2019-04-03 LAB — LIPID PANEL
Chol/HDL Ratio: 6.2 ratio — ABNORMAL HIGH (ref 0.0–4.4)
Cholesterol, Total: 224 mg/dL — ABNORMAL HIGH (ref 100–199)
HDL: 36 mg/dL — ABNORMAL LOW (ref >39)
LDL Chol Calc (NIH): 145 mg/dL — ABNORMAL HIGH (ref 0–99)
Triglycerides: 233 mg/dL — ABNORMAL HIGH (ref 0–149)
VLDL Cholesterol Cal: 43 mg/dL — ABNORMAL HIGH (ref 5–40)

## 2019-04-03 LAB — COMPREHENSIVE METABOLIC PANEL
ALT: 51 IU/L — ABNORMAL HIGH (ref 0–32)
AST: 52 IU/L — ABNORMAL HIGH (ref 0–40)
Albumin/Globulin Ratio: 0.9 — ABNORMAL LOW (ref 1.2–2.2)
Albumin: 4 g/dL (ref 3.8–4.8)
Alkaline Phosphatase: 100 IU/L (ref 39–117)
BUN/Creatinine Ratio: 13 (ref 9–23)
BUN: 7 mg/dL (ref 6–20)
Bilirubin Total: 0.5 mg/dL (ref 0.0–1.2)
CO2: 21 mmol/L (ref 20–29)
Calcium: 9.6 mg/dL (ref 8.7–10.2)
Chloride: 101 mmol/L (ref 96–106)
Creatinine, Ser: 0.55 mg/dL — ABNORMAL LOW (ref 0.57–1.00)
GFR calc Af Amer: 137 mL/min/{1.73_m2} (ref >59)
GFR calc non Af Amer: 119 mL/min/{1.73_m2} (ref >59)
Globulin, Total: 4.3 g/dL (ref 1.5–4.5)
Glucose: 160 mg/dL — ABNORMAL HIGH (ref 65–99)
Potassium: 4.8 mmol/L (ref 3.5–5.2)
Sodium: 139 mmol/L (ref 134–144)
Total Protein: 8.3 g/dL (ref 6.0–8.5)

## 2019-04-03 LAB — TSH: TSH: 1.12 u[IU]/mL (ref 0.450–4.500)

## 2019-04-22 ENCOUNTER — Other Ambulatory Visit: Payer: Self-pay | Admitting: Family Medicine

## 2019-04-22 MED ORDER — METFORMIN HCL 1000 MG PO TABS: 1000.0000 mg | ORAL_TABLET | Freq: Two times a day (BID) | ORAL | 1 refills | Status: DC

## 2019-04-25 ENCOUNTER — Telehealth: Payer: Self-pay

## 2019-06-23 ENCOUNTER — Other Ambulatory Visit: Payer: Self-pay

## 2019-06-23 ENCOUNTER — Ambulatory Visit (INDEPENDENT_AMBULATORY_CARE_PROVIDER_SITE_OTHER): Payer: 59 | Admitting: Family Medicine

## 2019-06-23 ENCOUNTER — Encounter: Payer: Self-pay | Admitting: Family Medicine

## 2019-06-23 VITALS — BP 131/86 | HR 92 | Temp 97.6°F | Ht 61.0 in | Wt 139.0 lb

## 2019-06-23 DIAGNOSIS — E119 Type 2 diabetes mellitus without complications: Secondary | ICD-10-CM | POA: Diagnosis not present

## 2019-06-23 DIAGNOSIS — E7800 Pure hypercholesterolemia, unspecified: Secondary | ICD-10-CM | POA: Diagnosis not present

## 2019-06-23 DIAGNOSIS — L650 Telogen effluvium: Secondary | ICD-10-CM

## 2019-06-23 DIAGNOSIS — L659 Nonscarring hair loss, unspecified: Secondary | ICD-10-CM

## 2019-06-23 DIAGNOSIS — H7292 Unspecified perforation of tympanic membrane, left ear: Secondary | ICD-10-CM

## 2019-06-23 MED ORDER — METFORMIN HCL 1000 MG PO TABS: 1000.0000 mg | ORAL_TABLET | Freq: Two times a day (BID) | ORAL | 1 refills | Status: DC

## 2019-06-23 NOTE — Patient Instructions (Signed)

## 2019-06-23 NOTE — Progress Notes (Signed)
5/10/20218:34 AM  Marie Hill 1980-08-02, 39 y.o., female [id number]  Chief Complaint  Patient presents with  . Alopecia    x 3 weeks / CT scan   . Referral    to ear surgery for hx of L ear infection    HPI:   Patient is a 39 y.o. female with past medical history significant for DM2 and HLP who presents today for with several concerns  Last OV Feb 2021 - a1c 8.6, increased metformin  She did not increase her metformin She has been eating smaller portions  For past 3 weeks has had significant hair loss about 3 months after having covid  About a year ago she had a ear infection with perforated TM She feels that TM has not healed She has seen ENT before and surgery was discussed if ([facility] Redmond [facility]) continued to bother her, which it does, causes pain, decreased hearing, whooshing sound She is requesting referral to new ENT (change of insurance)  She has been working with [facility] for over a year She has new appt at [place] Results  Component Value Date   HGBA1C 8.6 (H) 04/02/2019   HGBA1C 6.6 (H) 06/22/2016   HGBA1C 5.9 (H) 10/12/2014   Lab Results  Component Value Date   LDLCALC 145 (H) 04/02/2019   CREATININE 0.55 (L) 04/02/2019   Lab Results  Component Value Date   TSH 1.120 04/02/2019    Depression screen [facility] 2/9 06/23/2019 03/31/2019 04/26/2017  Decreased Interest 0 0 0  Down, Depressed, Hopeless 0 0 0  PHQ - 2 Score 0 0 0    Fall Risk  06/23/2019 03/31/2019 04/26/2017 10/12/2014 08/05/2014  Falls in the past year? 0 0 No No No  Number falls in past yr: 0 0 - - -  Injury with Fall? 0 0 - - -  Follow up Falls evaluation completed - - - -     No Known Allergies  Prior to Admission medications   Medication Sig Start Date End Date Taking? Authorizing Provider  metFORMIN (GLUCOPHAGE) 1000 MG tablet Take 1 tablet (1,000 mg total) by mouth 2 (two) times daily with a meal. Patient not taking: Reported on 06/23/2019 04/22/19   Rutherford Guys, MD  Prenatal  Vit-Fe Fumarate-FA (PRENATAL PO) Take 1 tablet by mouth daily.    [provider]    Past Medical History:  Diagnosis Date  . Dandruff   . 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 Glucose intolerance (impaired glucose tolerance)   . Hyperlipidemia   . Positive PPD, treated    states follow up chest x rays negative  . 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 LAPAROSCOPIC UNILATERAL SALPINGO OOPHERECTOMY N/A 09/29/2014   Procedure: LAPAROSCOPIC LEFT SALPINGO OOPHORECTOMY ADHESIOLYSIS ;  Surgeon: Everitt Amber, MD;  [facility];  Service: Gynecology;  Laterality: N/A;  . ROBOTIC ASSISTED LAPAROSCOPIC OVARIAN CYSTECTOMY N/A 09/29/2014   Procedure: SI ROBOTIC ASSISTED LAPAROSCOPIC OVARIAN CYSTECTOMY;  Surgeon: Everitt Amber, MD;  [facility];  Service: Gynecology;  Laterality: N/A;    Social History   Tobacco Use  . Smoking status: Never Smoker  . Smokeless tobacco: Never Used  Substance Use Topics  . Alcohol use: Not Currently    Alcohol/week: 0.0 standard drinks    Family History  Problem Relation  Age of Onset  . Hyperlipidemia Mother   . Hypertension Mother   . Hyperlipidemia Father     Review of Systems  Constitutional: Negative for chills and fever.  Respiratory: Negative for cough and shortness of breath.   Cardiovascular: Negative for chest pain, palpitations and leg swelling.  Gastrointestinal: Negative for abdominal pain, nausea and vomiting.   Per hpi  OBJECTIVE:  Today's Vitals   06/23/19 0827  BP: 131/86  Pulse: 92  Temp: 97.6 F (36.4 C)  SpO2: 96%  Weight: 139 lb (63 kg)  Height: _0  (1.549 m)   Body mass index is 26.26 kg/m.   Wt Readings from Last 3 Encounters:  06/23/19 139 lb (63 kg)  04/26/17 135 lb (61.2 kg)  04/14/17 131 lb 12.8 oz (59.8 kg)     Physical Exam Vitals and nursing note reviewed.    Constitutional:      Appearance: She is well-developed.  HENT:     Head: Normocephalic and atraumatic.     Right Ear: Hearing, tympanic membrane, ear canal and external ear normal.     Left Ear: Hearing, ear canal and external ear normal. Tympanic membrane is perforated.  Eyes:     Conjunctiva/sclera: Conjunctivae normal.     Pupils: Pupils are equal, round, and reactive to light.  Cardiovascular:     Rate and Rhythm: Normal rate and regular rhythm.     Heart sounds: Normal heart sounds. No murmur. No friction rub. No gallop.   Pulmonary:     Effort: Pulmonary effort is normal.     Breath sounds: Normal breath sounds. No wheezing or rales.  Musculoskeletal:     Cervical back: Neck supple.  Lymphadenopathy:     Cervical: No cervical adenopathy.  Skin:    General: Skin is warm and dry.     Comments: Diffuse hair loss. No scarring. Scattered flaky macules  Neurological:     Mental Status: She is alert and oriented to person, place, and time.     No results found for this or any previous visit (from the past 24 hour(s)).  No results found.   ASSESSMENT and PLAN  1. Type 2 diabetes mellitus without complication, without long-term current use of insulin (HCC) Not at goal. Increase metformin as discussed. Cont working on [employer], discussed importance of controlled cbgs for future pregnancies - CMP14+EGFR - Hemoglobin A1c  2. Pure hypercholesterolemia Discussed LFM - no statin as attempting fertility - Lipid panel  3. Telogen effluvium 4. Hair loss Reassured patient common after covid infection and eventual return of hair growth - Ferritin  5. Perforation of left tympanic membrane - Ambulatory referral to ENT  Other orders - metFORMIN (GLUCOPHAGE) 1000 MG tablet; Take 1 tablet (1,000 mg total) by mouth 2 (two) times daily with a meal.  Return in about 3 months (around 09/23/2019). (if not under REI care)    Rutherford Guys, MD Primary Care at [facility], [place] [address] Ph.  [phone] Fax [phone]

## 2019-06-23 NOTE — Telephone Encounter (Signed)
No action needed, closing encounter

## 2019-06-24 LAB — LIPID PANEL
Chol/HDL Ratio: 6.2 ratio — ABNORMAL HIGH (ref 0.0–4.4)
Cholesterol, Total: 236 mg/dL — ABNORMAL HIGH (ref 100–199)
HDL: 38 mg/dL — ABNORMAL LOW (ref >39)
LDL Chol Calc (NIH): 166 mg/dL — ABNORMAL HIGH (ref 0–99)
Triglycerides: 173 mg/dL — ABNORMAL HIGH (ref 0–149)
VLDL Cholesterol Cal: 32 mg/dL (ref 5–40)

## 2019-06-24 LAB — HEMOGLOBIN A1C
Est. average glucose Bld gHb Est-mCnc: 186 mg/dL
Hgb A1c MFr Bld: 8.1 % — ABNORMAL HIGH (ref 4.8–5.6)

## 2019-06-24 LAB — CMP14+EGFR
ALT: 31 IU/L (ref 0–32)
AST: 22 IU/L (ref 0–40)
Albumin/Globulin Ratio: 1.4 (ref 1.2–2.2)
Albumin: 4.5 g/dL (ref 3.8–4.8)
Alkaline Phosphatase: 119 IU/L — ABNORMAL HIGH (ref 39–117)
BUN/Creatinine Ratio: 17 (ref 9–23)
BUN: 10 mg/dL (ref 6–20)
Bilirubin Total: 0.6 mg/dL (ref 0.0–1.2)
CO2: 25 mmol/L (ref 20–29)
Calcium: 9.8 mg/dL (ref 8.7–10.2)
Chloride: 99 mmol/L (ref 96–106)
Creatinine, Ser: 0.6 mg/dL (ref 0.57–1.00)
GFR calc Af Amer: 133 mL/min/{1.73_m2} (ref >59)
GFR calc non Af Amer: 115 mL/min/{1.73_m2} (ref >59)
Globulin, Total: 3.3 g/dL (ref 1.5–4.5)
Glucose: 209 mg/dL — ABNORMAL HIGH (ref 65–99)
Potassium: 4.4 mmol/L (ref 3.5–5.2)
Sodium: 137 mmol/L (ref 134–144)
Total Protein: 7.8 g/dL (ref 6.0–8.5)

## 2019-06-24 LAB — FERRITIN: Ferritin: 114 ng/mL (ref 15–150)

## 2019-07-07 ENCOUNTER — Encounter: Payer: Self-pay | Admitting: Radiology

## 2019-07-07 ENCOUNTER — Ambulatory Visit (INDEPENDENT_AMBULATORY_CARE_PROVIDER_SITE_OTHER): Payer: Self-pay | Admitting: Otolaryngology

## 2019-07-09 ENCOUNTER — Ambulatory Visit (INDEPENDENT_AMBULATORY_CARE_PROVIDER_SITE_OTHER): Payer: 59 | Admitting: Otolaryngology

## 2019-07-09 ENCOUNTER — Encounter (INDEPENDENT_AMBULATORY_CARE_PROVIDER_SITE_OTHER): Payer: Self-pay | Admitting: Otolaryngology

## 2019-07-09 ENCOUNTER — Other Ambulatory Visit: Payer: Self-pay

## 2019-07-09 VITALS — Temp 97.7°F

## 2019-07-09 DIAGNOSIS — H7292 Unspecified perforation of tympanic membrane, left ear: Secondary | ICD-10-CM

## 2019-07-09 DIAGNOSIS — H6532 Chronic mucoid otitis media, left ear: Secondary | ICD-10-CM

## 2019-07-09 NOTE — Progress Notes (Signed)
HPI: Marie Hill is a 39 y.o. female who presents is referred by Dr. Pamella Pert for evaluation of chronic left TM perforation that has had intermittent drainage.  She has been on antibiotics in the past as well as antibiotic eardrops but is presently not on either one.  She is having drainage from the left ear as well as decreased hearing in the left ear.  Apparently she had a hole in the left ear for about 6 years now.  She is not sure how she developed a hole but thought it was from [place] internationally.  Past Medical History:  Diagnosis Date  . Dandruff   . 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 Glucose intolerance (impaired glucose tolerance)   . Hyperlipidemia   . Positive PPD, treated    states follow up chest x rays negative  . 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 LAPAROSCOPIC UNILATERAL SALPINGO OOPHERECTOMY N/A 09/29/2014   Procedure: LAPAROSCOPIC LEFT SALPINGO OOPHORECTOMY ADHESIOLYSIS ;  Surgeon: Everitt Amber, MD;  [facility];  Service: Gynecology;  Laterality: N/A;  . ROBOTIC ASSISTED LAPAROSCOPIC OVARIAN CYSTECTOMY N/A 09/29/2014   Procedure: SI ROBOTIC ASSISTED LAPAROSCOPIC OVARIAN CYSTECTOMY;  Surgeon: Everitt Amber, MD;  [facility];  Service: Gynecology;  Laterality: N/A;   Social History   Socioeconomic History  . Marital status: Married    Spouse name: Not on file  . Number of children: 0  . Years of education: Not on file  . Highest education level: Not on file  Occupational History  . Occupation: Metallurgist  Tobacco Use  . Smoking status: Never Smoker  . Smokeless tobacco: Never Used  Substance and Sexual Activity  . Alcohol use: Not Currently    Alcohol/week: 0.0 standard drinks  . Drug use: No  . Sexual activity: Not Currently    Partners: Male    Birth control/protection: None  Other Topics Concern  . Not on file   Social History Narrative   Marital status:  Single; together x 8 years.      Children: none      Lives: with boyfriend; from [place]; moved to [place] at age 89.      Employment:  Cytogeneticist.      Tobacco: none      Alcohol: weekends      Drugs: none      Exercise:  Sporadic      Seatbelt: 100%; no texting   Social Determinants of Health   Financial Resource Strain:   . Difficulty of Paying Living Expenses:   Food Insecurity:   . Worried About Charity fundraiser in the Last Year:   . Arboriculturist in the Last Year:   Transportation Needs:   . Film/video editor (Medical):   Marland Kitchen Lack of Transportation (Non-Medical):   Physical Activity:   . Days of Exercise per Week:   . Minutes of Exercise per Session:   Stress:   . Feeling of Stress :   Social Connections:   . Frequency of Communication with Friends and Family:   . Frequency of Social Gatherings with Friends and Family:   . Attends Religious Services:   . Active Member of Clubs or Organizations:   . Attends Archivist Meetings:   Marland Kitchen Marital Status:    Family History  Problem Relation Age of Onset  . Hyperlipidemia Mother   . Hypertension Mother   . Hyperlipidemia Father    No Known Allergies Prior to Admission medications   Medication Sig Start Date End Date Taking? Authorizing Provider  metFORMIN (GLUCOPHAGE) 1000 MG tablet Take 1 tablet (1,000 mg total) by mouth 2 (two) times daily with a meal. 06/23/19  Yes Rutherford Guys, MD  Prenatal Vit-Fe Fumarate-FA (PRENATAL PO) Take 1 tablet by mouth daily.   Yes [provider]     Positive ROS: Otherwise negative  All other systems have been reviewed and were otherwise negative with the exception of those mentioned in the HPI and as above.  Physical Exam: Constitutional: Alert, well-appearing, no acute distress Ears: External ears without lesions or tenderness.  Right ear canal and right TM are clear.  The left ear  has purulent discharge in the left ear canal that was cleaned with suction.  She has a posterior central TM perforation with a large amount of granulation tissue around the perforation site.  She has purulent discharge from the perforation site.  After cleaning the ear canal with suction I applied gentian violet Floxin and CSF powder to the left ear.  On tuning fork testing Weber lateralized to the left side with AC equivocal to [facility] on the left side.  AC > BC on the right side. Nasal: External nose without lesions. Clear nasal passages Oral: Lips and gums without lesions. Tongue and palate mucosa without lesions. Posterior oropharynx clear. Neck: No palpable adenopathy or masses Respiratory: Breathing comfortably  Skin: No facial/neck lesions or rash noted.  Binocular microscopy  Date/Time: 07/09/2019 9:57 AM Performed by: Rozetta Nunnery, MD Authorized by: Rozetta Nunnery, MD   Consent:    Consent obtained:  Verbal   Consent given by:  Patient Procedure details:    Indications: otitis media and examination of tympanic membrane     Scope location: left ear     Left speculum size:  4 Right ear canal:    normal   Right tympanic membrane:    Mobility: fully mobile   Left tympanic membrane:    Perforation: central perforation   Comments:     Patient with a chronic left TM perforation with granulation tissue around the perforation site and purulent discharge from the perforation site.  After cleaning the ear canal with suction I applied gentian violet, Floxin and CSF powder to the left ear.    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  Plan: Placed her on Cipro dexamethasone eardrops 4 to 5 drops in the left ear twice daily for 1 week in addition to Augmentin 875 mg twice daily for 10 days.  She will follow-up in 2 weeks for recheck. This most likely will require surgery to repair when the infection resolves.   Radene Journey, MD   CC:

## 2019-07-17 ENCOUNTER — Encounter: Payer: Self-pay | Admitting: Family Medicine

## 2019-07-17 ENCOUNTER — Other Ambulatory Visit: Payer: Self-pay

## 2019-07-17 ENCOUNTER — Ambulatory Visit (INDEPENDENT_AMBULATORY_CARE_PROVIDER_SITE_OTHER): Payer: 59 | Admitting: Family Medicine

## 2019-07-17 VITALS — BP 124/85 | HR 88 | Temp 98.2°F | Resp 15 | Ht 61.0 in | Wt 141.5 lb

## 2019-07-17 DIAGNOSIS — E1165 Type 2 diabetes mellitus with hyperglycemia: Secondary | ICD-10-CM | POA: Diagnosis not present

## 2019-07-17 DIAGNOSIS — E782 Mixed hyperlipidemia: Secondary | ICD-10-CM

## 2019-07-17 NOTE — Progress Notes (Signed)
6/3/20211:30 PM  Marie Hill 1980/07/31, 39 y.o., female [id number]  Chief Complaint  Patient presents with  . Establish Care    pt would like to follow up on hair loss issues and wants to be sure she is healthy enough for her fertility treatments has already been established with them    HPI:   Patient is a 39 y.o. female with past medical history significant for DM2 and HLP  who presents today for routine followup  Last OV may 2021 - increased metformin She has been tolerating higher dose of metformin [facility] Saw ENT, was given abx ggt, will see them again in 2 weeks for ruptured TMs Diet high on fruits  Lab Results  Component Value Date   HGBA1C 8.1 (H) 06/23/2019   HGBA1C 8.6 (H) 04/02/2019   HGBA1C 6.6 (H) 06/22/2016   Lab Results  Component Value Date   LDLCALC 166 (H) 06/23/2019   CREATININE 0.60 06/23/2019     Depression screen PHQ 2/9 07/17/2019 06/23/2019 03/31/2019  Decreased Interest 0 0 0  Down, Depressed, Hopeless 0 0 0  PHQ - 2 Score 0 0 0    Fall Risk  07/17/2019 06/23/2019 03/31/2019 04/26/2017 10/12/2014  Falls in the past year? 0 0 0 No No  Number falls in past yr: - 0 0 - -  Injury with Fall? - 0 0 - -  Follow up Falls evaluation completed Falls evaluation completed - - -     No Known Allergies  Prior to Admission medications   Medication Sig Start Date End Date Taking? Authorizing Provider  metFORMIN (GLUCOPHAGE) 1000 MG tablet Take 1 tablet (1,000 mg total) by mouth 2 (two) times daily with a meal. 06/23/19  Yes Rutherford Guys, MD  Prenatal Vit-Fe Fumarate-FA (PRENATAL PO) Take 1 tablet by mouth daily.   Yes [provider]    Past Medical History:  Diagnosis Date  . Dandruff   . 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 Glucose intolerance (impaired glucose tolerance)   . Hyperlipidemia   . Positive PPD, treated    states follow up chest x rays negative  . 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 LAPAROSCOPIC UNILATERAL SALPINGO OOPHERECTOMY N/A 09/29/2014   Procedure: LAPAROSCOPIC LEFT SALPINGO OOPHORECTOMY ADHESIOLYSIS ;  Surgeon: Everitt Amber, MD;  [facility];  Service: Gynecology;  Laterality: N/A;  . ROBOTIC ASSISTED LAPAROSCOPIC OVARIAN CYSTECTOMY N/A 09/29/2014   Procedure: SI ROBOTIC ASSISTED LAPAROSCOPIC OVARIAN CYSTECTOMY;  Surgeon: Everitt Amber, MD;  [facility]: [facility];  Service: Gynecology;  Laterality: N/A;    Social History   Tobacco Use  . Smoking status: Never Smoker  . Smokeless tobacco: Never Used  Substance Use Topics  . Alcohol use: Not Currently    Alcohol/week: 0.0 standard drinks    Family History  Problem Relation Age of Onset  . Hyperlipidemia Mother   . Hypertension Mother   . Hyperlipidemia Father     Review of Systems  Constitutional: Negative for chills and fever.  Respiratory: Negative for cough and shortness of breath.   Cardiovascular: Negative for chest pain, palpitations and leg swelling.  Gastrointestinal: Negative for abdominal pain, nausea and vomiting.  per hpi   OBJECTIVE:  Today's Vitals   07/17/19 1325  BP: 124/85  Pulse: 88  Resp: 15  Temp: 98.2 F (36.8 C)  TempSrc: Temporal  SpO2: 98%  Weight: 141 lb 8 oz (64.2 kg)  Height: 5\' 1"  (1.549 m)   Body mass index is 26.74 kg/m.   Physical Exam Vitals and nursing note reviewed.  Constitutional:      Appearance: She is well-developed.  HENT:     Head: Normocephalic and atraumatic.     Mouth/Throat:     Pharynx: No oropharyngeal exudate.  Eyes:     General: No scleral icterus.    Conjunctiva/sclera: Conjunctivae normal.     Pupils: Pupils are equal, round, and reactive to light.  Cardiovascular:     Rate and Rhythm: Normal rate and regular rhythm.     Heart sounds: Normal heart sounds. No murmur. No friction rub. No gallop.   Pulmonary:     Effort:  Pulmonary effort is normal.     Breath sounds: Normal breath sounds. No wheezing or rales.  Musculoskeletal:     Cervical back: Neck supple.  Skin:    General: Skin is warm and dry.  Neurological:     Mental Status: She is alert and oriented to person, place, and time.     No results found for this or any previous visit (from the past 24 hour(s)).  No results found.   ASSESSMENT and PLAN  1. Type 2 diabetes mellitus with hyperglycemia, without long-term current use of insulin (HCC) Continue with LFM and current regime - Microalbumin/Creatinine Ratio, Urine  2. Mixed hyperlipidemia Pursuing fertility. No statin. Discussed LFM.  Return in about 3 months (around 10/17/2019).    Rutherford Guys, MD Primary Care at [facility] [place], [place] [address] Ph.  [phone] Fax [phone]

## 2019-07-17 NOTE — Patient Instructions (Signed)
° ° ° °  If you have lab work done today you will be contacted with your lab results within the next 2 weeks.  If you have not heard from us then please contact us. The fastest way to get your results is to register for My Chart. ° ° °IF you received an x-ray today, you will receive an invoice from [facility]. Please contact [facility] at [phone] with questions or concerns regarding your invoice.  ° °IF you received labwork today, you will receive an invoice from [facility]. Please contact [facility] at [phone] with questions or concerns regarding your invoice.  ° °Our billing staff will not be able to assist you with questions regarding bills from these companies. ° °You will be contacted with the lab results as soon as they are available. The fastest way to get your results is to activate your My Chart account. Instructions are located on the last page of this paperwork. If you have not heard from us regarding the results in 2 weeks, please contact this office. °  ° ° ° °

## 2019-07-18 LAB — MICROALBUMIN / CREATININE URINE RATIO
Creatinine, Urine: 95.1 mg/dL
Microalb/Creat Ratio: 8 mg/g creat (ref 0–29)
Microalbumin, Urine: 7.9 ug/mL

## 2019-07-23 ENCOUNTER — Other Ambulatory Visit: Payer: Self-pay

## 2019-07-23 ENCOUNTER — Encounter (INDEPENDENT_AMBULATORY_CARE_PROVIDER_SITE_OTHER): Payer: Self-pay | Admitting: Otolaryngology

## 2019-07-23 ENCOUNTER — Ambulatory Visit (INDEPENDENT_AMBULATORY_CARE_PROVIDER_SITE_OTHER): Payer: 59 | Admitting: Otolaryngology

## 2019-07-23 VITALS — Temp 97.2°F

## 2019-07-23 DIAGNOSIS — H7292 Unspecified perforation of tympanic membrane, left ear: Secondary | ICD-10-CM | POA: Diagnosis not present

## 2019-07-23 NOTE — Progress Notes (Signed)
HPI: Marie Hill is a 39 y.o. female who returns today for evaluation of left TM perforation with history of intermittent drainage.  She states that it drains 2 or 3 times a year.  I have seen her 2 weeks ago with drainage from the left TM perforation and was treated with antibiotics and eardrops and this has now resolved.. She has history of type 2 diabetes on Metformin.  She is otherwise healthy. NKDA  Past Medical History:  Diagnosis Date  . Dandruff   . 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 Glucose intolerance (impaired glucose tolerance)   . Hyperlipidemia   . Positive PPD, treated    states follow up chest x rays negative  . 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 LAPAROSCOPIC UNILATERAL SALPINGO OOPHERECTOMY N/A 09/29/2014   Procedure: LAPAROSCOPIC LEFT SALPINGO OOPHORECTOMY ADHESIOLYSIS ;  Surgeon: Everitt Amber, MD;  Location: [facility];  Service: Gynecology;  Laterality: N/A;  . ROBOTIC ASSISTED LAPAROSCOPIC OVARIAN CYSTECTOMY N/A 09/29/2014   Procedure: SI ROBOTIC ASSISTED LAPAROSCOPIC OVARIAN CYSTECTOMY;  Surgeon: Everitt Amber, MD;  Location: [facility];  Service: Gynecology;  Laterality: N/A;   Social History   Socioeconomic History  . Marital status: Married    Spouse name: Not on file  . Number of children: 0  . Years of education: Not on file  . Highest education level: Not on file  Occupational History  . Occupation: Metallurgist  Tobacco Use  . Smoking status: Never Smoker  . Smokeless tobacco: Never Used  Substance and Sexual Activity  . Alcohol use: Not Currently    Alcohol/week: 0.0 standard drinks  . Drug use: No  . Sexual activity: Not Currently    Partners: Male    Birth control/protection: None  Other Topics Concern  . Not on file  Social History Narrative   Marital status:  Single; together x 8 years.      Children: none   Lives: with boyfriend; from [place]; moved to [place] at age 22.      Employment:  Cytogeneticist.      Tobacco: none      Alcohol: weekends      Drugs: none      Exercise:  Sporadic      Seatbelt: 100%; no texting   Social Determinants of Health   Financial Resource Strain:   . Difficulty of Paying Living Expenses:   Food Insecurity:   . Worried About Charity fundraiser in the Last Year:   . Arboriculturist in the Last Year:   Transportation Needs:   . Film/video editor (Medical):   Marland Kitchen Lack of Transportation (Non-Medical):   Physical Activity:   . Days of Exercise per Week:   . Minutes of Exercise per Session:   Stress:   . Feeling of Stress :   Social Connections:   . Frequency of Communication with Friends and Family:   . Frequency of Social Gatherings with Friends and Family:   . Attends Religious Services:   . Active Member of Clubs or Organizations:   . Attends Archivist Meetings:   Marland Kitchen Marital Status:    Family History  Problem Relation Age of Onset  . Hyperlipidemia Mother   . Hypertension Mother   . Hyperlipidemia Father    No Known Allergies Prior  to Admission medications   Medication Sig Start Date End Date Taking? Authorizing Provider  metFORMIN (GLUCOPHAGE) 1000 MG tablet Take 1 tablet (1,000 mg total) by mouth 2 (two) times daily with a meal. 06/23/19  Yes Rutherford Guys, MD  Prenatal Vit-Fe Fumarate-FA (PRENATAL PO) Take 1 tablet by mouth daily.   Yes [provider]     Positive ROS: Otherwise negative  All other systems have been reviewed and were otherwise negative with the exception of those mentioned in the HPI and as above.  Physical Exam: Constitutional: Alert, well-appearing, no acute distress Ears: External ears without lesions or tenderness. Ear canals are clear bilaterally.  Right TM is clear.  Left TM reveals a central TM perforation which is dry.  On tuning fork testing she had minimal hearing  loss on the left side with the 1024 tuning fork. Nasal: External nose without lesions. Clear nasal passages Oral: Lips and gums without lesions. Tongue and palate mucosa without lesions. Posterior oropharynx clear. Neck: No palpable adenopathy or masses Respiratory: Breathing comfortably  Skin: No facial/neck lesions or rash noted.  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  Plan: We will plan on scheduling left tympanoplasty in the next few weeks.  I discussed with her concerning obtaining audiogram and bring into the office prior to the surgery.   Radene Journey, MD

## 2019-07-24 ENCOUNTER — Encounter: Payer: Self-pay | Admitting: Radiology

## 2019-07-25 ENCOUNTER — Encounter (INDEPENDENT_AMBULATORY_CARE_PROVIDER_SITE_OTHER): Payer: Self-pay

## 2019-08-11 ENCOUNTER — Encounter (HOSPITAL_BASED_OUTPATIENT_CLINIC_OR_DEPARTMENT_OTHER): Payer: Self-pay | Admitting: Otolaryngology

## 2019-08-11 ENCOUNTER — Other Ambulatory Visit: Payer: Self-pay

## 2019-08-14 ENCOUNTER — Encounter (HOSPITAL_BASED_OUTPATIENT_CLINIC_OR_DEPARTMENT_OTHER)
Admission: RE | Admit: 2019-08-14 | Discharge: 2019-08-14 | Disposition: A | Discharge status: Home / Self Care | Payer: 59 | Source: Ambulatory Visit | Attending: Otolaryngology | Admitting: Otolaryngology

## 2019-08-14 DIAGNOSIS — Z01818 Encounter for other preprocedural examination: Secondary | ICD-10-CM | POA: Insufficient documentation

## 2019-08-14 LAB — BASIC METABOLIC PANEL
Anion gap: 9 (ref 5–15)
BUN: 7 mg/dL (ref 6–20)
CO2: 25 mmol/L (ref 22–32)
Calcium: 9.2 mg/dL (ref 8.9–10.3)
Chloride: 105 mmol/L (ref 98–111)
Creatinine, Ser: 0.51 mg/dL (ref 0.44–1.00)
GFR calc Af Amer: >60 mL/min (ref >60)
GFR calc non Af Amer: >60 mL/min (ref >60)
Glucose, Bld: 138 mg/dL — ABNORMAL HIGH (ref 70–99)
Potassium: 4.1 mmol/L (ref 3.5–5.1)
Sodium: 139 mmol/L (ref 135–145)

## 2019-08-15 ENCOUNTER — Other Ambulatory Visit (HOSPITAL_COMMUNITY)
Admission: RE | Admit: 2019-08-15 | Discharge: 2019-08-15 | Disposition: A | Discharge status: Home / Self Care | Payer: 59 | Source: Ambulatory Visit | Attending: Otolaryngology | Admitting: Otolaryngology

## 2019-08-15 ENCOUNTER — Ambulatory Visit (INDEPENDENT_AMBULATORY_CARE_PROVIDER_SITE_OTHER): Payer: Self-pay | Admitting: Otolaryngology

## 2019-08-15 DIAGNOSIS — Z01812 Encounter for preprocedural laboratory examination: Secondary | ICD-10-CM | POA: Insufficient documentation

## 2019-08-15 DIAGNOSIS — Z20822 Contact with and (suspected) exposure to covid-19: Secondary | ICD-10-CM | POA: Insufficient documentation

## 2019-08-15 DIAGNOSIS — H7292 Unspecified perforation of tympanic membrane, left ear: Secondary | ICD-10-CM

## 2019-08-15 LAB — SARS CORONAVIRUS 2 (TAT 6-24 HRS): SARS Coronavirus 2: NEGATIVE

## 2019-08-15 NOTE — H&P (Signed)
PREOPERATIVE H&P  Chief Complaint: Intermittent drainage from her left ear  HPI: Marie Hill is a 39 y.o. female who presents for evaluation of intermittent drainage from her left ear that she has 2 or 3 times a year.  On exam in the office she has a chronic central left TM perforation.  This was treated with antibiotics and eardrops and the drainage has stopped but the perforation persisted.  She apparently has had this for a number of years.  She is taken to the operating room at this time for left tympanoplasty.  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  Past Medical History:  Diagnosis Date  . Dandruff   . Diabetes mellitus without complication ([place])   . 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 Glucose intolerance (impaired glucose tolerance)   . Hyperlipidemia   . Positive PPD, treated    states follow up chest x rays negative  . 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 LAPAROSCOPIC UNILATERAL SALPINGO OOPHERECTOMY N/A 09/29/2014   Procedure: LAPAROSCOPIC LEFT SALPINGO OOPHORECTOMY ADHESIOLYSIS ;  Surgeon: Everitt Amber, MD;  Location: [facility];  Service: Gynecology;  Laterality: N/A;  . ROBOTIC ASSISTED LAPAROSCOPIC OVARIAN CYSTECTOMY N/A 09/29/2014   Procedure: SI ROBOTIC ASSISTED LAPAROSCOPIC OVARIAN CYSTECTOMY;  Surgeon: Everitt Amber, MD;  Location: [facility];  Service: Gynecology;  Laterality: N/A;   Social History   Socioeconomic History  . Marital status: Married    Spouse name: Not on file  . Number of children: 0  . Years of education: Not on file  . Highest education level: Not on file  Occupational History  . Occupation: Metallurgist  Tobacco Use  . Smoking status: Never Smoker  . Smokeless tobacco: Never Used  Vaping Use  . Vaping Use: Never used  Substance and Sexual Activity  . Alcohol use: Not  Currently    Alcohol/week: 0.0 standard drinks  . Drug use: No  . Sexual activity: Not Currently    Partners: Male    Birth control/protection: None  Other Topics Concern  . Not on file  Social History Narrative   Marital status:  Single; together x 8 years.      Children: none      Lives: with boyfriend; from [place]; moved to [place] at age 67.      Employment:  Cytogeneticist.      Tobacco: none      Alcohol: weekends      Drugs: none      Exercise:  Sporadic      Seatbelt: 100%; no texting   Social Determinants of Health   Financial Resource Strain:   . Difficulty of Paying Living Expenses:   Food Insecurity:   . Worried About Charity fundraiser in the Last Year:   . Arboriculturist in the Last Year:   Transportation Needs:   . Film/video editor (Medical):   Marland Kitchen Lack of Transportation (Non-Medical):   Physical Activity:   . Days of Exercise per Week:   . Minutes of Exercise per Session:   Stress:   . Feeling of Stress :   Social Connections:   . Frequency of Communication with Friends and Family:   . Frequency of Social Gatherings with Friends and Family:   .  Attends Religious Services:   . Active Member of Clubs or Organizations:   . Attends Archivist Meetings:   Marland Kitchen Marital Status:    Family History  Problem Relation Age of Onset  . Hyperlipidemia Mother   . Hypertension Mother   . Hyperlipidemia Father    No Known Allergies Prior to Admission medications   Medication Sig Start Date End Date Taking? Authorizing Provider  metFORMIN (GLUCOPHAGE) 1000 MG tablet Take 1 tablet (1,000 mg total) by mouth 2 (two) times daily with a meal. 06/23/19   Rutherford Guys, MD  Prenatal Vit-Fe Fumarate-FA (PRENATAL PO) Take 1 tablet by mouth daily.    [provider]     Positive ROS: Otherwise negative  All other systems have been reviewed and were otherwise negative with the exception of those mentioned in the HPI and as  above.  Physical Exam: There were no vitals filed for this visit.  General: Alert, no acute distress Oral: Normal oral mucosa and tonsils Nasal: Clear nasal passages Neck: No palpable adenopathy or thyroid nodules Ear: Ear canal is clear.  Patient with a left central TM perforation which is dry with no active drainage. Cardiovascular: Regular rate and rhythm, no murmur.  Respiratory: Clear to auscultation Neurologic: Alert and oriented x 3   Assessment/Plan: Chronic left tympanic membrane perforation  Plan for left tympanoplasty   Melony Overly, MD 08/15/2019 10:11 AM

## 2019-08-15 NOTE — H&P (View-Only) (Signed)
PREOPERATIVE H&P  Chief Complaint: Intermittent drainage from her left ear  HPI: Marie Hill is a 39 y.o. female who presents for evaluation of intermittent drainage from her left ear that she has 2 or 3 times a year.  On exam in the office she has a chronic central left TM perforation.  This was treated with antibiotics and eardrops and the drainage has stopped but the perforation persisted.  She apparently has had this for a number of years.  She is taken to the operating room at this time for left tympanoplasty.  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   no meds 1997, treated in June 2016 x 1 month- states neg chest x ray June 2016   Past Surgical History:  Procedure Laterality Date   LAPAROSCOPIC UNILATERAL SALPINGO OOPHERECTOMY N/A 09/29/2014   Procedure: LAPAROSCOPIC LEFT SALPINGO OOPHORECTOMY ADHESIOLYSIS ;  Surgeon: Everitt Amber, MD;  Location: [facility];  Service: Gynecology;  Laterality: N/A;   ROBOTIC ASSISTED LAPAROSCOPIC OVARIAN CYSTECTOMY N/A 09/29/2014   Procedure: SI ROBOTIC ASSISTED LAPAROSCOPIC OVARIAN CYSTECTOMY;  Surgeon: Everitt Amber, MD;  Location: [facility];  Service: Gynecology;  Laterality: N/A;   Social History   Socioeconomic History   Marital status: Married    Spouse name: Not on file   Number of children: 0   Years of education: Not on file   Highest education level: Not on file  Occupational History   Occupation: Metallurgist  Tobacco Use   Smoking status: Never Smoker   Smokeless tobacco: Never Used  Scientific laboratory technician Use: Never used  Substance and Sexual Activity   Alcohol use: Not  Currently    Alcohol/week: 0.0 standard drinks   Drug use: No   Sexual activity: Not Currently    Partners: Male    Birth control/protection: None  Other Topics Concern   Not on file  Social History Narrative   Marital status:  Single; together x 8 years.      Children: none      Lives: with boyfriend; from [place]; moved to [place] at age 18.      Employment:  Cytogeneticist.      Tobacco: none      Alcohol: weekends      Drugs: none      Exercise:  Sporadic      Seatbelt: 100%; no texting   Social Determinants of Radio broadcast assistant Strain:    Difficulty of Paying Living Expenses:   Food Insecurity:    Worried About Charity fundraiser in the Last Year:    Arboriculturist in the Last Year:   Transportation Needs:    Film/video editor (Medical):    Lack of Transportation (Non-Medical):   Physical Activity:    Days of Exercise per Week:    Minutes of Exercise per Session:   Stress:    Feeling of Stress :   Social Connections:    Frequency of Communication with Friends and Family:    Frequency of Social Gatherings with Friends and Family:  Attends Religious Services:    Active Member of Clubs or Organizations:    Attends Music therapist:    Marital Status:    Family History  Problem Relation Age of Onset   Hyperlipidemia Mother    Hypertension Mother    Hyperlipidemia Father    No Known Allergies Prior to Admission medications   Medication Sig Start Date End Date Taking? Authorizing Provider  metFORMIN (GLUCOPHAGE) 1000 MG tablet Take 1 tablet (1,000 mg total) by mouth 2 (two) times daily with a meal. 06/23/19   Rutherford Guys, MD  Prenatal Vit-Fe Fumarate-FA (PRENATAL PO) Take 1 tablet by mouth daily.    [provider]     Positive ROS: Otherwise negative  All other systems have been reviewed and were otherwise negative with the exception of those mentioned in the HPI and as  above.  Physical Exam: There were no vitals filed for this visit.  General: Alert, no acute distress Oral: Normal oral mucosa and tonsils Nasal: Clear nasal passages Neck: No palpable adenopathy or thyroid nodules Ear: Ear canal is clear.  Patient with a left central TM perforation which is dry with no active drainage. Cardiovascular: Regular rate and rhythm, no murmur.  Respiratory: Clear to auscultation Neurologic: Alert and oriented x 3   Assessment/Plan: Chronic left tympanic membrane perforation  Plan for left tympanoplasty   Melony Overly, MD 08/15/2019 10:11 AM

## 2019-08-19 ENCOUNTER — Encounter (HOSPITAL_BASED_OUTPATIENT_CLINIC_OR_DEPARTMENT_OTHER)
Admission: RE | Disposition: A | Discharge status: Home / Self Care | Payer: Self-pay | Source: Home / Self Care | Attending: Otolaryngology

## 2019-08-19 ENCOUNTER — Ambulatory Visit (HOSPITAL_BASED_OUTPATIENT_CLINIC_OR_DEPARTMENT_OTHER)
Admission: RE | Admit: 2019-08-19 | Discharge: 2019-08-19 | Disposition: A | Discharge status: Home / Self Care | Payer: 59 | Attending: Otolaryngology | Admitting: Otolaryngology

## 2019-08-19 ENCOUNTER — Ambulatory Visit (HOSPITAL_BASED_OUTPATIENT_CLINIC_OR_DEPARTMENT_OTHER): Payer: 59 | Admitting: Certified Registered"

## 2019-08-19 ENCOUNTER — Encounter (HOSPITAL_BASED_OUTPATIENT_CLINIC_OR_DEPARTMENT_OTHER): Payer: Self-pay | Admitting: Otolaryngology

## 2019-08-19 ENCOUNTER — Other Ambulatory Visit: Payer: Self-pay

## 2019-08-19 DIAGNOSIS — H7292 Unspecified perforation of tympanic membrane, left ear: Secondary | ICD-10-CM | POA: Diagnosis not present

## 2019-08-19 DIAGNOSIS — Z7984 Long term (current) use of oral hypoglycemic drugs: Secondary | ICD-10-CM | POA: Diagnosis not present

## 2019-08-19 DIAGNOSIS — D485 Neoplasm of uncertain behavior of skin: Secondary | ICD-10-CM | POA: Diagnosis not present

## 2019-08-19 DIAGNOSIS — H722X1 Other marginal perforations of tympanic membrane, right ear: Secondary | ICD-10-CM

## 2019-08-19 DIAGNOSIS — E119 Type 2 diabetes mellitus without complications: Secondary | ICD-10-CM | POA: Diagnosis not present

## 2019-08-19 HISTORY — DX: Type 2 diabetes mellitus without complications: E11.9

## 2019-08-19 HISTORY — PX: TYMPANOPLASTY: SHX33

## 2019-08-19 LAB — GLUCOSE, CAPILLARY
Glucose-Capillary: 118 mg/dL — ABNORMAL HIGH (ref 70–99)
Glucose-Capillary: 167 mg/dL — ABNORMAL HIGH (ref 70–99)

## 2019-08-19 LAB — POCT PREGNANCY, URINE: Preg Test, Ur: NEGATIVE

## 2019-08-19 SURGERY — TYMPANOPLASTY
Anesthesia: General | Site: Ear | Laterality: Left

## 2019-08-19 MED ORDER — AMISULPRIDE (ANTIEMETIC) 5 MG/2ML IV SOLN: 10.0000 mg | Freq: Once | INTRAVENOUS | Status: DC | PRN

## 2019-08-19 MED ORDER — ONDANSETRON HCL 4 MG/2ML IJ SOLN
INTRAMUSCULAR | Status: AC
  Filled 2019-08-19: qty 2

## 2019-08-19 MED ORDER — ONDANSETRON HCL 4 MG/2ML IJ SOLN
INTRAMUSCULAR | Status: DC | PRN
  Administered 2019-08-19: 4 mg via INTRAVENOUS

## 2019-08-19 MED ORDER — HYDROMORPHONE HCL 1 MG/ML IJ SOLN: 0.2500 mg | INTRAMUSCULAR | Status: DC | PRN

## 2019-08-19 MED ORDER — MIDAZOLAM HCL 2 MG/2ML IJ SOLN
INTRAMUSCULAR | Status: DC | PRN
  Administered 2019-08-19: 2 mg via INTRAVENOUS

## 2019-08-19 MED ORDER — CIPROFLOXACIN-DEXAMETHASONE 0.3-0.1 % OT SUSP
OTIC | Status: DC | PRN
  Administered 2019-08-19: 4 [drp] via OTIC

## 2019-08-19 MED ORDER — CHLORHEXIDINE GLUCONATE CLOTH 2 % EX PADS: 6.0000 | MEDICATED_PAD | Freq: Once | CUTANEOUS | Status: DC

## 2019-08-19 MED ORDER — FENTANYL CITRATE (PF) 100 MCG/2ML IJ SOLN
INTRAMUSCULAR | Status: AC
  Filled 2019-08-19: qty 2

## 2019-08-19 MED ORDER — PROPOFOL 10 MG/ML IV BOLUS
INTRAVENOUS | Status: AC
  Filled 2019-08-19: qty 20

## 2019-08-19 MED ORDER — LACTATED RINGERS IV SOLN: INTRAVENOUS | Status: DC

## 2019-08-19 MED ORDER — METHYLENE BLUE 0.5 % INJ SOLN
INTRAVENOUS | Status: DC | PRN
  Administered 2019-08-19: .1 mL via SUBMUCOSAL

## 2019-08-19 MED ORDER — EPHEDRINE 5 MG/ML INJ
INTRAVENOUS | Status: AC
  Filled 2019-08-19: qty 10

## 2019-08-19 MED ORDER — EPHEDRINE SULFATE 50 MG/ML IJ SOLN
INTRAMUSCULAR | Status: DC | PRN
Start: 2019-08-19 — End: 2019-08-19
  Administered 2019-08-19 (×2): 10 mg via INTRAVENOUS

## 2019-08-19 MED ORDER — DEXAMETHASONE SODIUM PHOSPHATE 10 MG/ML IJ SOLN
INTRAMUSCULAR | Status: DC | PRN
  Administered 2019-08-19: 10 mg via INTRAVENOUS

## 2019-08-19 MED ORDER — CEFAZOLIN SODIUM-DEXTROSE 2-3 GM-%(50ML) IV SOLR
INTRAVENOUS | Status: DC | PRN
  Administered 2019-08-19: 2 g via INTRAVENOUS

## 2019-08-19 MED ORDER — PHENYLEPHRINE HCL (PRESSORS) 10 MG/ML IV SOLN
INTRAVENOUS | Status: DC | PRN
  Administered 2019-08-19 (×2): 120 ug via INTRAVENOUS
  Administered 2019-08-19 (×2): 80 ug via INTRAVENOUS

## 2019-08-19 MED ORDER — LACTATED RINGERS IV SOLN: INTRAVENOUS | Status: DC | PRN

## 2019-08-19 MED ORDER — CEFAZOLIN SODIUM-DEXTROSE 2-4 GM/100ML-% IV SOLN
INTRAVENOUS | Status: AC
  Filled 2019-08-19: qty 100

## 2019-08-19 MED ORDER — MEPERIDINE HCL 25 MG/ML IJ SOLN: 6.2500 mg | INTRAMUSCULAR | Status: DC | PRN

## 2019-08-19 MED ORDER — LIDOCAINE HCL (CARDIAC) PF 100 MG/5ML IV SOSY
PREFILLED_SYRINGE | INTRAVENOUS | Status: DC | PRN
  Administered 2019-08-19: 60 mg via INTRAVENOUS

## 2019-08-19 MED ORDER — PROPOFOL 10 MG/ML IV BOLUS
INTRAVENOUS | Status: DC | PRN
  Administered 2019-08-19: 150 mg via INTRAVENOUS

## 2019-08-19 MED ORDER — OXYCODONE HCL 5 MG PO TABS: 5.0000 mg | ORAL_TABLET | Freq: Once | ORAL | Status: DC | PRN

## 2019-08-19 MED ORDER — EPINEPHRINE PF 1 MG/ML IJ SOLN
INTRAMUSCULAR | Status: DC | PRN
  Administered 2019-08-19: .25 mL

## 2019-08-19 MED ORDER — LIDOCAINE-EPINEPHRINE 1 %-1:100000 IJ SOLN
INTRAMUSCULAR | Status: DC | PRN
  Administered 2019-08-19: 7 mL

## 2019-08-19 MED ORDER — CEPHALEXIN 500 MG PO CAPS: 500.0000 mg | ORAL_CAPSULE | Freq: Three times a day (TID) | ORAL | 0 refills | Status: AC

## 2019-08-19 MED ORDER — OXYCODONE HCL 5 MG/5ML PO SOLN: 5.0000 mg | Freq: Once | ORAL | Status: DC | PRN

## 2019-08-19 MED ORDER — BACITRACIN ZINC 500 UNIT/GM EX OINT
TOPICAL_OINTMENT | CUTANEOUS | Status: DC | PRN
  Administered 2019-08-19: 1 via TOPICAL

## 2019-08-19 MED ORDER — CIPROFLOXACIN-DEXAMETHASONE 0.3-0.1 % OT SUSP
OTIC | Status: AC
  Filled 2019-08-19: qty 7.5

## 2019-08-19 MED ORDER — CEFAZOLIN SODIUM-DEXTROSE 2-4 GM/100ML-% IV SOLN: 2.0000 g | INTRAVENOUS | Status: DC

## 2019-08-19 MED ORDER — PHENYLEPHRINE 40 MCG/ML (10ML) SYRINGE FOR IV PUSH (FOR BLOOD PRESSURE SUPPORT)
PREFILLED_SYRINGE | INTRAVENOUS | Status: AC
  Filled 2019-08-19: qty 10

## 2019-08-19 MED ORDER — LIDOCAINE 2% (20 MG/ML) 5 ML SYRINGE
INTRAMUSCULAR | Status: AC
  Filled 2019-08-19: qty 5

## 2019-08-19 MED ORDER — MIDAZOLAM HCL 2 MG/2ML IJ SOLN
INTRAMUSCULAR | Status: AC
  Filled 2019-08-19: qty 2

## 2019-08-19 MED ORDER — DEXAMETHASONE SODIUM PHOSPHATE 10 MG/ML IJ SOLN
INTRAMUSCULAR | Status: AC
  Filled 2019-08-19: qty 1

## 2019-08-19 MED ORDER — FENTANYL CITRATE (PF) 100 MCG/2ML IJ SOLN
INTRAMUSCULAR | Status: DC | PRN
  Administered 2019-08-19: 100 ug via INTRAVENOUS
  Administered 2019-08-19 (×2): 25 ug via INTRAVENOUS

## 2019-08-19 MED ORDER — PROMETHAZINE HCL 25 MG/ML IJ SOLN: 6.2500 mg | INTRAMUSCULAR | Status: DC | PRN

## 2019-08-19 SURGICAL SUPPLY — 61 items
BENZOIN TINCTURE PRP APPL 2/3 (GAUZE/BANDAGES/DRESSINGS) ×2 IMPLANT
BLADE CLIPPER SURG (BLADE) ×2 IMPLANT
BLADE EAR TYMPAN 2.5 60D BEAV (BLADE) ×2 IMPLANT
BLADE EYE SICKLE 84 5 BEAV (BLADE) IMPLANT
BLADE NEEDLE 3 SS STRL (BLADE) IMPLANT
CANISTER SUCT 1200ML W/VALVE (MISCELLANEOUS) ×2 IMPLANT
COTTONBALL LRG STERILE PKG (GAUZE/BANDAGES/DRESSINGS) ×2 IMPLANT
COVER WAND RF STERILE (DRAPES) IMPLANT
DECANTER SPIKE VIAL GLASS SM (MISCELLANEOUS) IMPLANT
DEPRESSOR TONGUE BLADE STERILE (MISCELLANEOUS) ×4 IMPLANT
DERMABOND ADVANCED (GAUZE/BANDAGES/DRESSINGS) ×1
DERMABOND ADVANCED .7 DNX12 (GAUZE/BANDAGES/DRESSINGS) ×1 IMPLANT
DRAPE EENT ADH APERT 31X51 STR (DRAPES) ×2 IMPLANT
DRAPE MICROSCOPE URBAN (DRAPES) ×2 IMPLANT
DRAPE MICROSCOPE WILD 40.5X102 (DRAPES) ×2 IMPLANT
DRAPE SURG 17X23 STRL (DRAPES) IMPLANT
DRSG CURAD 3X16 NADH (PACKING) IMPLANT
DRSG GLASSCOCK MASTOID ADT (GAUZE/BANDAGES/DRESSINGS) ×2 IMPLANT
DRSG GLASSCOCK MASTOID PED (GAUZE/BANDAGES/DRESSINGS) IMPLANT
ELECT COATED BLADE 2.86 ST (ELECTRODE) ×2 IMPLANT
ELECT REM PT RETURN 9FT ADLT (ELECTROSURGICAL) ×2
ELECTRODE REM PT RTRN 9FT ADLT (ELECTROSURGICAL) ×1 IMPLANT
GAUZE SPONGE 4X4 12PLY STRL (GAUZE/BANDAGES/DRESSINGS) IMPLANT
GAUZE SPONGE 4X4 12PLY STRL LF (GAUZE/BANDAGES/DRESSINGS) IMPLANT
GLOVE BIO SURGEON STRL SZ 6.5 (GLOVE) ×2 IMPLANT
GLOVE BIOGEL M 6.5 STRL (GLOVE) ×2 IMPLANT
GLOVE BIOGEL PI IND STRL 7.0 (GLOVE) ×3 IMPLANT
GLOVE BIOGEL PI INDICATOR 7.0 (GLOVE) ×3
GLOVE ECLIPSE 6.5 STRL STRAW (GLOVE) ×2 IMPLANT
GLOVE SS BIOGEL STRL SZ 7.5 (GLOVE) ×1 IMPLANT
GLOVE SUPERSENSE BIOGEL SZ 7.5 (GLOVE) ×1
GOWN STRL REUS W/ TWL LRG LVL3 (GOWN DISPOSABLE) ×3 IMPLANT
GOWN STRL REUS W/ TWL XL LVL3 (GOWN DISPOSABLE) ×1 IMPLANT
GOWN STRL REUS W/TWL LRG LVL3 (GOWN DISPOSABLE) ×3
GOWN STRL REUS W/TWL XL LVL3 (GOWN DISPOSABLE) ×1
IV CATH AUTO 14GX1.75 SAFE ORG (IV SOLUTION) IMPLANT
IV NS 500ML (IV SOLUTION)
IV NS 500ML BAXH (IV SOLUTION) IMPLANT
IV SET EXT 30 76VOL 4 MALE LL (IV SETS) ×2 IMPLANT
NDL SAFETY ECLIPSE 18X1.5 (NEEDLE) IMPLANT
NEEDLE HYPO 18GX1.5 SHARP (NEEDLE)
NEEDLE PRECISIONGLIDE 27X1.5 (NEEDLE) ×2 IMPLANT
NS IRRIG 1000ML POUR BTL (IV SOLUTION) ×2 IMPLANT
PACK BASIN DAY SURGERY FS (CUSTOM PROCEDURE TRAY) ×2 IMPLANT
PACK ENT DAY SURGERY (CUSTOM PROCEDURE TRAY) ×2 IMPLANT
PENCIL SMOKE EVACUATOR (MISCELLANEOUS) ×2 IMPLANT
SHEET MEDIUM DRAPE 40X70 STRL (DRAPES) IMPLANT
SLEEVE SCD COMPRESS KNEE MED (MISCELLANEOUS) ×2 IMPLANT
SPONGE SURGIFOAM ABS GEL 12-7 (HEMOSTASIS) ×4 IMPLANT
STRIP CLOSURE SKIN 1/2X4 (GAUZE/BANDAGES/DRESSINGS) IMPLANT
SUT CHROMIC 2 0 SH (SUTURE) IMPLANT
SUT CHROMIC 3 0 PS 2 (SUTURE) ×2 IMPLANT
SUT ETHILON 4 0 P 3 18 (SUTURE) IMPLANT
SUT ETHILON 5 0 P 3 18 (SUTURE) ×1
SUT NYLON ETHILON 5-0 P-3 1X18 (SUTURE) ×1 IMPLANT
SUT PLAIN 5 0 P 3 18 (SUTURE) IMPLANT
SYR 3ML 18GX1 1/2 (SYRINGE) ×2 IMPLANT
SYR TB 1ML LL NO SAFETY (SYRINGE) IMPLANT
TOWEL GREEN STERILE FF (TOWEL DISPOSABLE) ×2 IMPLANT
TRAY DSU PREP LF (CUSTOM PROCEDURE TRAY) ×2 IMPLANT
TUBING IRRIGATION (MISCELLANEOUS) IMPLANT

## 2019-08-19 NOTE — Anesthesia Procedure Notes (Signed)
Procedure Name: LMA Insertion Performed by: Lauralyn Shadowens M, CRNA Pre-anesthesia Checklist: Patient identified, Emergency Drugs available, Suction available and Patient being monitored Patient Re-evaluated:Patient Re-evaluated prior to induction Oxygen Delivery Method: Circle system utilized Preoxygenation: Pre-oxygenation with 100% oxygen Induction Type: IV induction Ventilation: Mask ventilation without difficulty LMA: LMA inserted LMA Size: 3.0 Number of attempts: 1 Airway Equipment and Method: Bite block Placement Confirmation: positive ETCO2 Tube secured with: Tape Dental Injury: Teeth and Oropharynx as per pre-operative assessment        

## 2019-08-19 NOTE — Anesthesia Preprocedure Evaluation (Addendum)
Anesthesia Evaluation  Patient identified by MRN, date of birth, ID band Patient awake    Reviewed: Allergy & Precautions, NPO status , Patient's Chart, lab work & pertinent test results  Airway Mallampati: II  TM Distance: >3 FB Neck ROM: Full    Dental no notable dental hx. (+)    Pulmonary neg pulmonary ROS,    Pulmonary exam normal breath sounds clear to auscultation       Cardiovascular negative cardio ROS Normal cardiovascular exam Rhythm:Regular Rate:Normal     Neuro/Psych negative neurological ROS  negative psych ROS   GI/Hepatic negative GI ROS, Neg liver ROS,   Endo/Other  negative endocrine ROSdiabetes, Type 2, Oral Hypoglycemic Agents  Renal/GU negative Renal ROS  negative genitourinary   Musculoskeletal negative musculoskeletal ROS (+)   Abdominal   Peds negative pediatric ROS (+)  Hematology negative hematology ROS (+)   Anesthesia Other Findings   Reproductive/Obstetrics negative OB ROS                            Anesthesia Physical  Anesthesia Plan  ASA: III  Anesthesia Plan: General   Post-op Pain Management:    Induction: Intravenous  PONV Risk Score and Plan: 3 and Ondansetron, Dexamethasone, Midazolam and Treatment may vary due to age or medical condition  Airway Management Planned: LMA  Additional Equipment:   Intra-op Plan:   Post-operative Plan: Extubation in OR  Informed Consent: I have reviewed the patients History and Physical, chart, labs and discussed the procedure including the risks, benefits and alternatives for the proposed anesthesia with the patient or authorized representative who has indicated his/her understanding and acceptance.     Dental advisory given  Plan Discussed with: CRNA  Anesthesia Plan Comments:        Anesthesia Quick Evaluation

## 2019-08-19 NOTE — Discharge Instructions (Signed)
  Post Anesthesia Home Care Instructions  Activity: Get plenty of rest for the remainder of the day. A responsible individual must stay with you for 24 hours following the procedure.  For the next 24 hours, DO NOT: -Drive a car -Paediatric nurse -Drink alcoholic beverages -Take any medication unless instructed by your physician -Make any legal decisions or sign important papers.  Meals: Start with liquid foods such as gelatin or soup. Progress to regular foods as tolerated. Avoid greasy, spicy, heavy foods. If nausea and/or vomiting occur, drink only clear liquids until the nausea and/or vomiting subsides. Call your physician if vomiting continues.  Special Instructions/Symptoms: Your throat may feel dry or sore from the anesthesia or the breathing tube placed in your throat during surgery. If this causes discomfort, gargle with warm salt water. The discomfort should disappear within 24 hours.  Take antibiotics as prescribed keflex 500 mg tid for 7 days Remove ear dressing in AM and can wash behind ear but keep ear canal dry Remove cotton ball from ear canal and replace with clean cotton ball Call Dr Lucia Gaskins for follow up appt in 1 week Can apply antibiotic ointment to small incision behind ear

## 2019-08-19 NOTE — Transfer of Care (Signed)
Immediate Anesthesia Transfer of Care Note  Patient: Marie Hill  Procedure(s) Performed: LEFT TYMPANOPLASTY (Left Ear)  Patient Location: PACU  Anesthesia Type:General  Level of Consciousness: drowsy and patient cooperative  Airway & Oxygen Therapy: Patient Spontanous Breathing and Patient connected to face mask oxygen  Post-op Assessment: Report given to RN, Post -op Vital signs reviewed and stable, Patient moving all extremities X 4 and Patient able to stick tongue midline  Post vital signs: Reviewed and stable  Last Vitals:  Vitals Value Taken Time  BP 156/98 08/19/19 1327  Temp 37.2 C 08/19/19 1327  Pulse 99 08/19/19 1329  Resp 15 08/19/19 1329  SpO2 98 % 08/19/19 1329  Vitals shown include unvalidated device data.  Last Pain:  Vitals:   08/19/19 1327  TempSrc:   PainSc: 0-No pain         Complications: No complications documented.

## 2019-08-19 NOTE — Interval H&P Note (Signed)
History and Physical Interval Note:  08/19/2019 11:02 AM  Marie Hill  has presented today for surgery, with the diagnosis of LEFT TYMPANIC MEMBRANE PERFORATION.  The various methods of treatment have been discussed with the patient and family. After consideration of risks, benefits and other options for treatment, the patient has consented to  Procedure(s): TYMPANOPLASTY (Left) as a surgical intervention.  The patient's history has been reviewed, patient examined, no change in status, stable for surgery.  I have reviewed the patient's chart and labs.  Questions were answered to the patient's satisfaction.     Melony Overly

## 2019-08-19 NOTE — Anesthesia Postprocedure Evaluation (Signed)
Anesthesia Post Note  Patient: Marie Hill  Procedure(s) Performed: LEFT TYMPANOPLASTY (Left Ear)     Patient location during evaluation: PACU Anesthesia Type: General Level of consciousness: awake and alert Pain management: pain level controlled Vital Signs Assessment: post-procedure vital signs reviewed and stable Respiratory status: spontaneous breathing, nonlabored ventilation, respiratory function stable and patient connected to nasal cannula oxygen Cardiovascular status: blood pressure returned to baseline and stable Postop Assessment: no apparent nausea or vomiting Anesthetic complications: no   No complications documented.  Last Vitals:  Vitals:   08/19/19 1430 08/19/19 1453  BP: 138/75 140/76  Pulse: 95 74  Resp: 15 20  Temp:  36.7 C  SpO2: 97% 96%    Last Pain:  Vitals:   08/19/19 1453  TempSrc: Oral  PainSc: 0-No pain                 Effie Berkshire

## 2019-08-19 NOTE — Brief Op Note (Signed)
08/19/2019  1:18 PM  PATIENT:  Marie Hill  39 y.o. female  PRE-OPERATIVE DIAGNOSIS:  LEFT TYMPANIC MEMBRANE PERFORATION  POST-OPERATIVE DIAGNOSIS:  LEFT TYMPANIC MEMBRANE PERFORATION  PROCEDURE:  Procedure(s): LEFT TYMPANOPLASTY (Left)  SURGEON:  Surgeon(s) and Role:    Rozetta Nunnery, MD - Primary  PHYSICIAN ASSISTANT:   ASSISTANTS: none   ANESTHESIA:   general  EBL:  5 mL   BLOOD ADMINISTERED:none  DRAINS: none   LOCAL MEDICATIONS USED:  XYLOCAINE   SPECIMEN:  No Specimen  DISPOSITION OF SPECIMEN:  N/A  COUNTS:  YES  TOURNIQUET:  * No tourniquets in log *  DICTATION: .Other Dictation: Dictation Number [id number]  PLAN OF CARE: Discharge to home after PACU  PATIENT DISPOSITION:  PACU - hemodynamically stable.   Delay start of Pharmacological VTE agent (>24hrs) due to surgical blood loss or risk of bleeding: yes

## 2019-08-19 NOTE — Op Note (Signed)
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  OPERATION PERFORMED:  Left tympanoplasty.  SURGEON:  Melony Overly, MD.  ANESTHESIA:  General endotracheal.  COMPLICATIONS:  None.  BRIEF CLINICAL NOTE:  The patient is a 39 year old female who has had a chronic left TM perforation for several years with intermittent drainage from her left ear.  On recent examination, she was treated because of drainage and the perforation is now dry  with no active drainage noted.  The patient has denied any drainage from her ear recently.  She is taken to the operating room at this time for left tympanoplasty.  DESCRIPTION OF PROCEDURE:  After adequate LMA anesthesia, the patient received 2 g of Ancef IV preoperatively.  Left ear was prepped with Betadine solution and draped out in sterile towels.  The ear was examined and the patient had a small to medium size  inferior anterior TM perforation.  No active drainage was noted.  The ear canal was injected with Xylocaine with epinephrine for hemostasis,  as well as a postauricular incision for harvesting the temporalis fascia graft.  The rim of the perforation was  freshened up with picks and cup forceps to make a slightly larger hole with fresh edges.  Next, the ear canal was examined and a posterior based flap was incised with a round knife and sickle knife.  The flap was elevated down to the annulus using a  round knife and small periosteal elevator.  Once I had the annulus, a cotton pledget soaked in adrenaline was placed for hemostasis.  Next, a temporalis fascia graft was harvested from a  postauricular incision.  Temporalis fascial graft was set aside to  dry and then the postauricular incision was cauterized for hemostasis and closed with 3-0 chromic suture subcutaneously and 5-0 nylon to reapproximate the skin edges.  Next, attention was carried back to the ear canal.  A tympanomeatal flap was then elevated, along with the annulus carefully to include the entire annulus posteriorly inferiorly.  Of note, on elevating the annulus from the annular sulcus, there was a  little bit of clear mucus aspirated from the middle ear space.  After elevating the annulus up toward the area of the chorda tympani, which was preserved, you could barely see the incus and stapes, but could not see them entirely.  There was no disease  process noted up here.  Next, inspection was carried down inferiorly underneath the perforation and middle ear space was mucosal, covered with no evidence of squamous debris.  A temporalis fascia graft was cut to appropriate size and placed underneath  the tympanomeatal flap, covering the entire perforation.  The middle ear space was packed with Gelfoam soaked in Ciprodex.  The flap was brought back down and the fascia graft covered the entire perforation anteriorly inferiorly.  After bringing the  fascia graft tympanomeatal flap back down, the ear canal was packed with Gelfoam soaked in Ciprodex.  After packing the ear canal with Gelfoam soaked in Ciprodex, a mastoid dressing was applied.  The patient was awoken from anesthesia and transferred to  the recovery room  postoperatively doing well.  DISPOSITION:  The patient is discharged home later this morning on Keflex 500 mg t.i.d. for 10 days.  She will follow up in my office in 1 week to have the postauricular sutures removed and will follow up 2 weeks after that to have the packing removed  from the ear canal.  VN/NUANCE  D:08/19/2019 T:08/19/2019 JOB:[id number]

## 2019-08-20 NOTE — Addendum Note (Signed)
Addendum  created 08/20/19 1325 by Francesa Eugenio, Ernesta Amble, CRNA   Charge Capture section accepted

## 2019-08-21 ENCOUNTER — Encounter (HOSPITAL_BASED_OUTPATIENT_CLINIC_OR_DEPARTMENT_OTHER): Payer: Self-pay | Admitting: Otolaryngology

## 2019-08-25 ENCOUNTER — Other Ambulatory Visit: Payer: Self-pay

## 2019-08-25 ENCOUNTER — Encounter (INDEPENDENT_AMBULATORY_CARE_PROVIDER_SITE_OTHER): Payer: Self-pay | Admitting: Otolaryngology

## 2019-08-25 ENCOUNTER — Ambulatory Visit (INDEPENDENT_AMBULATORY_CARE_PROVIDER_SITE_OTHER): Payer: 59 | Admitting: Otolaryngology

## 2019-08-25 VITALS — Temp 97.9°F

## 2019-08-25 DIAGNOSIS — Z4889 Encounter for other specified surgical aftercare: Secondary | ICD-10-CM

## 2019-08-25 NOTE — Progress Notes (Signed)
HPI: Marie Hill is a 39 y.o. female who presents 6 days s/p left tympanoplasty..   Past Medical History:  Diagnosis Date  . Dandruff   . Diabetes mellitus without complication ([place])   . 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 Glucose intolerance (impaired glucose tolerance)   . Hyperlipidemia   . Positive PPD, treated    states follow up chest x rays negative  . 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 LAPAROSCOPIC UNILATERAL SALPINGO OOPHERECTOMY N/A 09/29/2014   Procedure: LAPAROSCOPIC LEFT SALPINGO OOPHORECTOMY ADHESIOLYSIS ;  Surgeon: Everitt Amber, MD;  Location: [facility];  Service: Gynecology;  Laterality: N/A;  . ROBOTIC ASSISTED LAPAROSCOPIC OVARIAN CYSTECTOMY N/A 09/29/2014   Procedure: SI ROBOTIC ASSISTED LAPAROSCOPIC OVARIAN CYSTECTOMY;  Surgeon: Everitt Amber, MD;  Location: [facility];  Service: Gynecology;  Laterality: N/A;  . TYMPANOPLASTY Left 08/19/2019   Procedure: LEFT TYMPANOPLASTY;  Surgeon: Rozetta Nunnery, MD;  Location: [facility];  Service: ENT;  Laterality: Left;   Social History   Socioeconomic History  . Marital status: Married    Spouse name: Not on file  . Number of children: 0  . Years of education: Not on file  . Highest education level: Not on file  Occupational History  . Occupation: Metallurgist  Tobacco Use  . Smoking status: Never Smoker  . Smokeless tobacco: Never Used  Vaping Use  . Vaping Use: Never used  Substance and Sexual Activity  . Alcohol use: Not Currently    Alcohol/week: 0.0 standard drinks  . Drug use: No  . Sexual activity: Not Currently    Partners: Male    Birth control/protection: None  Other Topics Concern  . Not on file  Social History Narrative   Marital status:  Single; together x 8 years.      Children: none      Lives: with boyfriend; from [place]; moved to [place] at age 31.       Employment:  Cytogeneticist.      Tobacco: none      Alcohol: weekends      Drugs: none      Exercise:  Sporadic      Seatbelt: 100%; no texting   Social Determinants of Health   Financial Resource Strain:   . Difficulty of Paying Living Expenses:   Food Insecurity:   . Worried About Charity fundraiser in the Last Year:   . Arboriculturist in the Last Year:   Transportation Needs:   . Film/video editor (Medical):   Marland Kitchen Lack of Transportation (Non-Medical):   Physical Activity:   . Days of Exercise per Week:   . Minutes of Exercise per Session:   Stress:   . Feeling of Stress :   Social Connections:   . Frequency of Communication with Friends and Family:   . Frequency of Social Gatherings with Friends and Family:   . Attends Religious Services:   . Active Member of Clubs or Organizations:   . Attends Archivist Meetings:   Marland Kitchen Marital Status:    Family History  Problem Relation Age of Onset  . Hyperlipidemia Mother   . Hypertension Mother   . Hyperlipidemia Father    No Known Allergies Prior to Admission medications   Medication Sig Start Date End Date  Taking? Authorizing Provider  cephALEXin (KEFLEX) 500 MG capsule Take 1 capsule (500 mg total) by mouth 3 (three) times daily for 7 days. 08/19/19 08/26/19 Yes Rozetta Nunnery, MD  metFORMIN (GLUCOPHAGE) 1000 MG tablet Take 1 tablet (1,000 mg total) by mouth 2 (two) times daily with a meal. 06/23/19  Yes Rutherford Guys, MD  Prenatal Vit-Fe Fumarate-FA (PRENATAL PO) Take 1 tablet by mouth daily.   Yes [provider]     Physical Exam: Postauricular sutures were removed in the office today.  No signs of infection.  Packing within the left ear canal is still intact with no signs of infection or drainage.   Assessment: S/p left tympanoplasty 6 days ago  Plan: She will follow-up in 2 weeks for recheck and removal of the ear canal packing.   Radene Journey, MD

## 2019-09-08 ENCOUNTER — Other Ambulatory Visit: Payer: Self-pay

## 2019-09-08 ENCOUNTER — Ambulatory Visit (INDEPENDENT_AMBULATORY_CARE_PROVIDER_SITE_OTHER): Payer: 59 | Admitting: Otolaryngology

## 2019-09-08 VITALS — Temp 97.2°F

## 2019-09-08 DIAGNOSIS — Z4889 Encounter for other specified surgical aftercare: Secondary | ICD-10-CM

## 2019-09-08 NOTE — Progress Notes (Signed)
HPI: Marie Hill is a 39 y.o. female who presents 20 days s/p left tympanoplasty.  She is doing well..   Past Medical History:  Diagnosis Date  . Dandruff   . Diabetes mellitus without complication ([place])   . 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 Glucose intolerance (impaired glucose tolerance)   . Hyperlipidemia   . Positive PPD, treated    states follow up chest x rays negative  . 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 LAPAROSCOPIC UNILATERAL SALPINGO OOPHERECTOMY N/A 09/29/2014   Procedure: LAPAROSCOPIC LEFT SALPINGO OOPHORECTOMY ADHESIOLYSIS ;  Surgeon: Everitt Amber, MD;  [facility];  Service: Gynecology;  Laterality: N/A;  . ROBOTIC ASSISTED LAPAROSCOPIC OVARIAN CYSTECTOMY N/A 09/29/2014   Procedure: SI ROBOTIC ASSISTED LAPAROSCOPIC OVARIAN CYSTECTOMY;  Surgeon: Everitt Amber, MD;  [facility];  Service: Gynecology;  Laterality: N/A;  . TYMPANOPLASTY Left 08/19/2019   Procedure: LEFT TYMPANOPLASTY;  Surgeon: Rozetta Nunnery, MD;  Location: [facility];  Service: ENT;  Laterality: Left;   Social History   Socioeconomic History  . Marital status: Married    Spouse name: Not on file  . Number of children: 0  . Years of education: Not on file  . Highest education level: Not on file  Occupational History  . Occupation: Metallurgist  Tobacco Use  . Smoking status: Never Smoker  . Smokeless tobacco: Never Used  Vaping Use  . Vaping Use: Never used  Substance and Sexual Activity  . Alcohol use: Not Currently    Alcohol/week: 0.0 standard drinks  . Drug use: No  . Sexual activity: Not Currently    Partners: Male    Birth control/protection: None  Other Topics Concern  . Not on file  Social History Narrative   Marital status:  Single; together x 8 years.      Children: none      Lives: with boyfriend; from [place]; moved to  [place] at age 56.      Employment:  Cytogeneticist.      Tobacco: none      Alcohol: weekends      Drugs: none      Exercise:  Sporadic      Seatbelt: 100%; no texting   Social Determinants of Health   Financial Resource Strain:   . Difficulty of Paying Living Expenses:   Food Insecurity:   . Worried About Charity fundraiser in the Last Year:   . Arboriculturist in the Last Year:   Transportation Needs:   . Film/video editor (Medical):   Marland Kitchen Lack of Transportation (Non-Medical):   Physical Activity:   . Days of Exercise per Week:   . Minutes of Exercise per Session:   Stress:   . Feeling of Stress :   Social Connections:   . Frequency of Communication with Friends and Family:   . Frequency of Social Gatherings with Friends and Family:   . Attends Religious Services:   . Active Member of Clubs or Organizations:   . Attends Archivist Meetings:   Marland Kitchen Marital Status:    Family History  Problem Relation Age of Onset  . Hyperlipidemia Mother   . Hypertension Mother   . Hyperlipidemia Father    No Known Allergies Prior to Admission medications   Medication  Sig Start Date End Date Taking? Authorizing Provider  metFORMIN (GLUCOPHAGE) 1000 MG tablet Take 1 tablet (1,000 mg total) by mouth 2 (two) times daily with a meal. 06/23/19  Yes Rutherford Guys, MD  Prenatal Vit-Fe Fumarate-FA (PRENATAL PO) Take 1 tablet by mouth daily.   Yes [provider]     Physical Exam: Packing was removed from the ear canal.  The graft has 100% take.   Assessment: S/p left tympanoplasty  Plan: She is okay to get water in her ear and will follow up in 1 month for recheck.   Radene Journey, MD

## 2019-10-06 ENCOUNTER — Encounter (INDEPENDENT_AMBULATORY_CARE_PROVIDER_SITE_OTHER): Payer: 59 | Admitting: Otolaryngology

## 2019-10-10 ENCOUNTER — Ambulatory Visit (INDEPENDENT_AMBULATORY_CARE_PROVIDER_SITE_OTHER): Payer: 59 | Admitting: Otolaryngology

## 2019-10-10 ENCOUNTER — Other Ambulatory Visit: Payer: Self-pay

## 2019-10-10 VITALS — Temp 97.3°F

## 2019-10-10 DIAGNOSIS — Z4889 Encounter for other specified surgical aftercare: Secondary | ICD-10-CM

## 2019-10-10 NOTE — Progress Notes (Signed)
HPI: Marie Hill is a 39 y.o. female who presents 50 days s/p left tympanoplasty.  She has been doing well with no drainage..   Past Medical History:  Diagnosis Date  . Dandruff   . Diabetes mellitus without complication ([place])   . 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 Glucose intolerance (impaired glucose tolerance)   . Hyperlipidemia   . Positive PPD, treated    states follow up chest x rays negative  . 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 LAPAROSCOPIC UNILATERAL SALPINGO OOPHERECTOMY N/A 09/29/2014   Procedure: LAPAROSCOPIC LEFT SALPINGO OOPHORECTOMY ADHESIOLYSIS ;  Surgeon: Everitt Amber, MD;  [facility];  Service: Gynecology;  Laterality: N/A;  . ROBOTIC ASSISTED LAPAROSCOPIC OVARIAN CYSTECTOMY N/A 09/29/2014   Procedure: SI ROBOTIC ASSISTED LAPAROSCOPIC OVARIAN CYSTECTOMY;  Surgeon: Everitt Amber, MD;  [facility];  Service: Gynecology;  Laterality: N/A;  . TYMPANOPLASTY Left 08/19/2019   Procedure: LEFT TYMPANOPLASTY;  Surgeon: Rozetta Nunnery, MD;  Location: [facility];  Service: ENT;  Laterality: Left;   Social History   Socioeconomic History  . Marital status: Married    Spouse name: Not on file  . Number of children: 0  . Years of education: Not on file  . Highest education level: Not on file  Occupational History  . Occupation: Metallurgist  Tobacco Use  . Smoking status: Never Smoker  . Smokeless tobacco: Never Used  Vaping Use  . Vaping Use: Never used  Substance and Sexual Activity  . Alcohol use: Not Currently    Alcohol/week: 0.0 standard drinks  . Drug use: No  . Sexual activity: Not Currently    Partners: Male    Birth control/protection: None  Other Topics Concern  . Not on file  Social History Narrative   Marital status:  Single; together x 8 years.      Children: none      Lives: with boyfriend;  from [place]; moved to [place] at age 20.      Employment:  Cytogeneticist.      Tobacco: none      Alcohol: weekends      Drugs: none      Exercise:  Sporadic      Seatbelt: 100%; no texting   Social Determinants of Health   Financial Resource Strain:   . Difficulty of Paying Living Expenses: Not on file  Food Insecurity:   . Worried About Charity fundraiser in the Last Year: Not on file  . Ran Out of Food in the Last Year: Not on file  Transportation Needs:   . Lack of Transportation (Medical): Not on file  . Lack of Transportation (Non-Medical): Not on file  Physical Activity:   . Days of Exercise per Week: Not on file  . Minutes of Exercise per Session: Not on file  Stress:   . Feeling of Stress : Not on file  Social Connections:   . Frequency of Communication with Friends and Family: Not on file  . Frequency of Social Gatherings with Friends and Family: Not on file  . Attends Religious Services: Not on file  . Active Member of Clubs or Organizations: Not on file  . Attends Archivist Meetings: Not on file  . Marital Status: Not on file   Family History  Problem Relation Age of Onset  . Hyperlipidemia Mother   . Hypertension Mother   . Hyperlipidemia Father    No Known Allergies Prior to Admission medications   Medication Sig Start Date End Date Taking? Authorizing Provider  metFORMIN (GLUCOPHAGE) 1000 MG tablet Take 1 tablet (1,000 mg total) by mouth 2 (two) times daily with a meal. 06/23/19  Yes Rutherford Guys, MD  Prenatal Vit-Fe Fumarate-FA (PRENATAL PO) Take 1 tablet by mouth daily.   Yes [provider]     Physical Exam: On microscopic exam of the left ear canal she has minimal scabbing.  The tympanoplasty has 100% take with an intact left TM.   Assessment: S/p left tympanoplasty  Plan: Perforation is entirely healed with no signs of infection. She will follow-up as needed   Radene Journey, MD

## 2019-10-17 ENCOUNTER — Encounter: Payer: Self-pay | Admitting: Family Medicine

## 2019-10-17 ENCOUNTER — Ambulatory Visit (INDEPENDENT_AMBULATORY_CARE_PROVIDER_SITE_OTHER): Payer: 59 | Admitting: Family Medicine

## 2019-10-17 ENCOUNTER — Other Ambulatory Visit: Payer: Self-pay

## 2019-10-17 VITALS — BP 125/86 | HR 85 | Temp 97.7°F | Ht 62.0 in | Wt 138.6 lb

## 2019-10-17 DIAGNOSIS — E119 Type 2 diabetes mellitus without complications: Secondary | ICD-10-CM

## 2019-10-17 DIAGNOSIS — Z23 Encounter for immunization: Secondary | ICD-10-CM

## 2019-10-17 DIAGNOSIS — Z7189 Other specified counseling: Secondary | ICD-10-CM | POA: Diagnosis not present

## 2019-10-17 DIAGNOSIS — Z7185 Encounter for immunization safety counseling: Secondary | ICD-10-CM

## 2019-10-17 MED ORDER — METFORMIN HCL 1000 MG PO TABS: 1000.0000 mg | ORAL_TABLET | Freq: Two times a day (BID) | ORAL | 1 refills | Status: DC

## 2019-10-17 NOTE — Patient Instructions (Signed)
° ° ° °  If you have lab work done today you will be contacted with your lab results within the next 2 weeks.  If you have not heard from us then please contact us. The fastest way to get your results is to register for My Chart. ° ° °IF you received an x-ray today, you will receive an invoice from [facility]. Please contact [facility] at [phone] with questions or concerns regarding your invoice.  ° °IF you received labwork today, you will receive an invoice from [facility]. Please contact [facility] at [phone] with questions or concerns regarding your invoice.  ° °Our billing staff will not be able to assist you with questions regarding bills from these companies. ° °You will be contacted with the lab results as soon as they are available. The fastest way to get your results is to activate your My Chart account. Instructions are located on the last page of this paperwork. If you have not heard from us regarding the results in 2 weeks, please contact this office. °  ° ° ° °

## 2019-10-17 NOTE — Progress Notes (Signed)
9/3/202110:59 AM  Marie Hill [birth year], 39 y.o., female [id number]  Chief Complaint  Patient presents with  . Diabetes    last wk had labs done at fertility doctor, a1c is 6.4. Has visit info in cell. Wans to talk about the effects of the covid vaccine and fertility    HPI:   Patient is a 39 y.o. female with past medical history significant for  DM2 and HLP who presents today for routine followup  Last OV June 2021 - no changes Seeing REI - last OV aug 2021 - plan for IVF, referred to MFM, A1c 6.5 (Oct 08 2019 - care every where) Saw ENT s/p left tympanoplasty aug 2021  She is overall doing well She has questions re covid vaccine She had covid in jan 2021 They will attempt harvesting eggs next cycle She needs refill of metformin  Depression screen [facility] 2/9 07/17/2019 06/23/2019 03/31/2019  Decreased Interest 0 0 0  Down, Depressed, Hopeless 0 0 0  PHQ - 2 Score 0 0 0    Fall Risk  07/17/2019 06/23/2019 03/31/2019 04/26/2017 10/12/2014  Falls in the past year? 0 0 0 No No  Number falls in past yr: - 0 0 - -  Injury with Fall? - 0 0 - -  Follow up Falls evaluation completed Falls evaluation completed - - -     No Known Allergies  Prior to Admission medications   Medication Sig Start Date End Date Taking? Authorizing Provider  metFORMIN (GLUCOPHAGE) 1000 MG tablet Take 1 tablet (1,000 mg total) by mouth 2 (two) times daily with a meal. 06/23/19  Yes Rutherford Guys, MD  Prenatal Vit-Fe Fumarate-FA (PRENATAL PO) Take 1 tablet by mouth daily.   Yes [provider]    Past Medical History:  Diagnosis Date  . Dandruff   . Diabetes mellitus without complication ([place])   . 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 Glucose intolerance (impaired glucose tolerance)   . Hyperlipidemia   . Positive PPD, treated    states follow up chest x rays negative  . 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 LAPAROSCOPIC UNILATERAL SALPINGO OOPHERECTOMY N/A 09/29/2014   Procedure: LAPAROSCOPIC LEFT SALPINGO OOPHORECTOMY ADHESIOLYSIS ;  Surgeon: Everitt Amber, MD;  [facility];  Service: Gynecology;  Laterality: N/A;  . ROBOTIC ASSISTED LAPAROSCOPIC OVARIAN CYSTECTOMY N/A 09/29/2014   Procedure: SI ROBOTIC ASSISTED LAPAROSCOPIC OVARIAN CYSTECTOMY;  Surgeon: Everitt Amber, MD;  [facility];  Service: Gynecology;  Laterality: N/A;  . TYMPANOPLASTY Left 08/19/2019   Procedure: LEFT TYMPANOPLASTY;  Surgeon: Rozetta Nunnery, MD;  Location: [facility];  Service: ENT;  Laterality: Left;    Social History   Tobacco Use  . Smoking status: Never Smoker  . Smokeless tobacco: Never Used  Substance Use Topics  . Alcohol use: Not Currently    Alcohol/week: 0.0 standard drinks    Family History  Problem Relation Age of Onset  . Hyperlipidemia Mother   . Hypertension Mother   . Hyperlipidemia Father     ROS Per hpi  OBJECTIVE:  Today's Vitals   10/17/19 1035  BP: 125/86  Pulse: 85  Temp: 97.7 F (36.5 C)  SpO2: 96%  Weight: 138 lb 9.6 oz (62.9 kg)  Height: 5\' 2"  (1.575 m)   Body mass index is 25.35  kg/m.   Physical Exam Vitals and nursing note reviewed.  Constitutional:      Appearance: She is well-developed.  HENT:     Head: Normocephalic and atraumatic.  Eyes:     General: No scleral icterus.    Conjunctiva/sclera: Conjunctivae normal.     Pupils: Pupils are equal, round, and reactive to light.  Pulmonary:     Effort: Pulmonary effort is normal.  Musculoskeletal:     Cervical back: Neck supple.  Skin:    General: Skin is warm and dry.  Neurological:     Mental Status: She is alert and oriented to person, place, and time.     No results found for this or any previous visit (from the past 24 hour(s)).  No results found.   ASSESSMENT and PLAN  1. Type 2 diabetes mellitus without complication,  without long-term current use of insulin (HCC) At goal. Refilled metformin. Discussed DM care will be under MFM/ob once pregnant  2. Need for prophylactic vaccination and inoculation against influenza - Flu Vaccine QUAD 36+ mos IM  3. Vaccine counseling Discussed at length covid vaccine and patients concerns. Answered all her questions. Advised vaccination now, to ensure protection during pregnancy  Other orders - metFORMIN (GLUCOPHAGE) 1000 MG tablet; Take 1 tablet (1,000 mg total) by mouth 2 (two) times daily with a meal.  No follow-ups on file.    Rutherford Guys, MD [facility] [place], [place] [address] Ph.  [phone] Fax [phone]

## 2020-01-23 ENCOUNTER — Other Ambulatory Visit: Payer: Self-pay

## 2020-01-23 ENCOUNTER — Ambulatory Visit (INDEPENDENT_AMBULATORY_CARE_PROVIDER_SITE_OTHER): Payer: 59 | Admitting: Family Medicine

## 2020-01-23 VITALS — BP 124/83 | HR 86 | Temp 98.0°F | Resp 15 | Ht 62.0 in | Wt 135.0 lb

## 2020-01-23 DIAGNOSIS — Z23 Encounter for immunization: Secondary | ICD-10-CM

## 2020-01-23 DIAGNOSIS — Z7185 Encounter for immunization safety counseling: Secondary | ICD-10-CM

## 2020-01-23 DIAGNOSIS — E1165 Type 2 diabetes mellitus with hyperglycemia: Secondary | ICD-10-CM

## 2020-01-23 DIAGNOSIS — E782 Mixed hyperlipidemia: Secondary | ICD-10-CM

## 2020-01-23 DIAGNOSIS — N979 Female infertility, unspecified: Secondary | ICD-10-CM

## 2020-01-23 DIAGNOSIS — M542 Cervicalgia: Secondary | ICD-10-CM

## 2020-01-23 LAB — COMPREHENSIVE METABOLIC PANEL
ALT: 31 IU/L (ref 0–32)
AST: 22 IU/L (ref 0–40)
Albumin/Globulin Ratio: 1.2 (ref 1.2–2.2)
Albumin: 4.4 g/dL (ref 3.8–4.8)
Alkaline Phosphatase: 100 IU/L (ref 44–121)
BUN/Creatinine Ratio: 17 (ref 9–23)
BUN: 9 mg/dL (ref 6–24)
Bilirubin Total: 0.5 mg/dL (ref 0.0–1.2)
CO2: 23 mmol/L (ref 20–29)
Calcium: 9.5 mg/dL (ref 8.7–10.2)
Chloride: 100 mmol/L (ref 96–106)
Creatinine, Ser: 0.52 mg/dL — ABNORMAL LOW (ref 0.57–1.00)
GFR calc Af Amer: 138 mL/min/{1.73_m2} (ref >59)
GFR calc non Af Amer: 120 mL/min/{1.73_m2} (ref >59)
Globulin, Total: 3.6 g/dL (ref 1.5–4.5)
Glucose: 128 mg/dL — ABNORMAL HIGH (ref 65–99)
Potassium: 4.5 mmol/L (ref 3.5–5.2)
Sodium: 137 mmol/L (ref 134–144)
Total Protein: 8 g/dL (ref 6.0–8.5)

## 2020-01-23 LAB — LIPID PANEL
Chol/HDL Ratio: 5.4 ratio — ABNORMAL HIGH (ref 0.0–4.4)
Cholesterol, Total: 241 mg/dL — ABNORMAL HIGH (ref 100–199)
HDL: 45 mg/dL (ref >39)
LDL Chol Calc (NIH): 154 mg/dL — ABNORMAL HIGH (ref 0–99)
Triglycerides: 227 mg/dL — ABNORMAL HIGH (ref 0–149)
VLDL Cholesterol Cal: 42 mg/dL — ABNORMAL HIGH (ref 5–40)

## 2020-01-23 LAB — HEMOGLOBIN A1C
Est. average glucose Bld gHb Est-mCnc: 148 mg/dL
Hgb A1c MFr Bld: 6.8 % — ABNORMAL HIGH (ref 4.8–5.6)

## 2020-01-23 MED ORDER — METFORMIN HCL 1000 MG PO TABS: 1000.0000 mg | ORAL_TABLET | Freq: Two times a day (BID) | ORAL | 1 refills | Status: DC

## 2020-01-23 NOTE — Patient Instructions (Addendum)
I would recommend discussing genetic testing or other testing questions with your infertility specialist.  Please let me know if you do want a referral for second opinion.   Schedule appointment with eye specialist for diabetic eye screening.  Continue Metformin twice per day, I will check A1c today, but follow-up in 3 months to make sure it is stable.  Area on the back of the neck may be muscle spasm.  Can try gentle range of motion, stretches throughout the day, heat or ice to the area if needed, but follow-up within the next month if that is not improving, sooner if worse.  Thank you for coming in today and take care.   If you have lab work done today you will be contacted with your lab results within the next 2 weeks.  If you have not heard from [place] then please contact us. The fastest way to get your results is to register for My Chart.   IF you received an x-ray today, you will receive an invoice from [facility]. Please contact [facility] at [phone] with questions or concerns regarding your invoice.   IF you received labwork today, you will receive an invoice from [facility]. Please contact [facility] at [phone] with questions or concerns regarding your invoice.   Our billing staff will not be able to assist you with questions regarding bills from these companies.  You will be contacted with the lab results as soon as they are available. The fastest way to get your results is to activate your My Chart account. Instructions are located on the last page of this paperwork. If you have not heard from [place] regarding the results in 2 weeks, please contact this office.

## 2020-01-23 NOTE — Progress Notes (Signed)
Subjective:  Patient ID: Marie Hill, female    DOB: Jan 28, 1981  Age: 40 y.o. MRN: [id number]  CC:  Chief Complaint  Patient presents with  . Establish Care    Pt needs TOC today doing well no concerns does need a metformin refill or another fertility safe medication     HPI Marie Hill presents for  Transition of care, previous primary care provider Dr. Pamella Pert. She is under the care of [place] for IVF. Marland Kitchen  Based on chart review it appears she has been prescribed Ganirelix prior. Has been having difficulty with success with IVF. Tried 3 different approaches. Hx of endometriosis. Prior miscarriage x2.  Will be following up with [facility] in next month or two for another round of treatments.  Plans 1 more treatment with [facility], then possible second opinion. Has been seen by [facility] prior. Wondering about testing needed.  Diabetes: With hyperglycemia.  Treated with Metformin 1000 mg twice daily -dose increased earlier this year with elevated A1c.  It was improving at that time from 8.6-8.1. Taking metformin 1000mg  BID.  Stopped metformin 2 weeks ago - ran out. No side effects on meds. Last A1c 6.5 on 10/08/19 at [facility]. 7.8 on 07/21/19.  Microalbumin: normal ratio 07/17/19.  Fasting today. Not on statin. Diet and exercise recommended after elevated lipids in May.  Optho, foot exam, pneumovax: saw optho last year. Planned 2 year follow up. Unsure if had pneumovax. Agrees to today.  covid vaccine - has received since last visit. Pfizer x 2. Had covid infection in January.    Lab Results  Component Value Date   HGBA1C 6.8 (H) 01/23/2020   HGBA1C 8.1 (H) 06/23/2019   HGBA1C 8.6 (H) 04/02/2019   Lab Results  Component Value Date   LDLCALC 154 (H) 01/23/2020   CREATININE 0.52 (L) 01/23/2020   Wt Readings from Last 3 Encounters:  01/23/20 135 lb (61.2 kg)  10/17/19 138 lb 9.6 oz (62.9 kg)  08/19/19 140 lb 6.9 oz (63.7 kg)  The 10-year ASCVD risk score Mikey Bussing DC Jr., et  al., 2013) is: 2.3%   Values used to calculate the score:     Age: 26 years     Sex: Female     Is Non-Hispanic African American: No     Diabetic: Yes     Tobacco smoker: No     Systolic Blood Pressure: 329 mmHg     Is BP treated: No     HDL Cholesterol: 45 mg/dL     Total Cholesterol: 241 mg/dL   At end of visit also notes some swelling or occasional swelling of the right side of her neck.  Notices when she is stressed at times.  Possibly noticed after prior ear surgery on the left, no attempted treatments.  History Patient Active Problem List   Diagnosis Date Noted  . Mass of left axilla 07/04/2016  . Miscarriage 06/30/2015  . Endometriosis of ovary 11/02/2014  . Infertility, female, primary 09/02/2014  . Hyperlipidemia 07/15/2014  . Glucose intolerance (impaired glucose tolerance) 07/15/2014   Past Medical History:  Diagnosis Date  . Dandruff   . Diabetes mellitus without complication ([place])   . 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 Glucose intolerance (impaired glucose tolerance)   . Hyperlipidemia   . Positive PPD, treated    states follow up chest x rays negative  . 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 LAPAROSCOPIC UNILATERAL SALPINGO OOPHERECTOMY N/A 09/29/2014   Procedure: LAPAROSCOPIC LEFT SALPINGO OOPHORECTOMY ADHESIOLYSIS ;  Surgeon: Everitt Amber, MD;  Location: [facility];  Service: Gynecology;  Laterality: N/A;  . ROBOTIC ASSISTED LAPAROSCOPIC OVARIAN CYSTECTOMY N/A 09/29/2014   Procedure: SI ROBOTIC ASSISTED LAPAROSCOPIC OVARIAN CYSTECTOMY;  Surgeon: Everitt Amber, MD;  Location: [facility];  Service: Gynecology;  Laterality: N/A;  . TYMPANOPLASTY Left 08/19/2019   Procedure: LEFT TYMPANOPLASTY;  Surgeon: Rozetta Nunnery, MD;  Location: [facility];  Service: ENT;  Laterality: Left;   No Known Allergies Prior to Admission  medications   Medication Sig Start Date End Date Taking? Authorizing Provider  Prenatal Vit-Fe Fumarate-FA (PRENATAL PO) Take 1 tablet by mouth daily.   Yes [provider]  metFORMIN (GLUCOPHAGE) 1000 MG tablet Take 1 tablet (1,000 mg total) by mouth 2 (two) times daily with a meal. Patient not taking: Reported on 01/23/2020 10/17/19   Jacelyn Pi, Lilia Argue, MD   Social History   Socioeconomic History  . Marital status: Married    Spouse name: Not on file  . Number of children: 0  . Years of education: Not on file  . Highest education level: Not on file  Occupational History  . Occupation: Metallurgist  Tobacco Use  . Smoking status: Never Smoker  . Smokeless tobacco: Never Used  Vaping Use  . Vaping Use: Never used  Substance and Sexual Activity  . Alcohol use: Not Currently    Alcohol/week: 0.0 standard drinks  . Drug use: No  . Sexual activity: Not Currently    Partners: Male    Birth control/protection: None  Other Topics Concern  . Not on file  Social History Narrative   Marital status:  Single; together x 8 years.      Children: none      Lives: with boyfriend; from [place]; moved to [place] at age 65.      Employment:  Cytogeneticist.      Tobacco: none      Alcohol: weekends      Drugs: none      Exercise:  Sporadic      Seatbelt: 100%; no texting   Social Determinants of Health   Financial Resource Strain: Not on file  Food Insecurity: Not on file  Transportation Needs: Not on file  Physical Activity: Not on file  Stress: Not on file  Social Connections: Not on file  Intimate Partner Violence: Not on file    Review of Systems  Constitutional: Negative for fatigue and unexpected weight change.  Respiratory: Negative for chest tightness and shortness of breath.   Cardiovascular: Negative for chest pain, palpitations and leg swelling.  Gastrointestinal: Negative for abdominal pain and blood in stool.  Neurological:  Negative for dizziness, syncope, light-headedness and headaches.     Objective:   Vitals:   01/23/20 0943  BP: 124/83  Pulse: 86  Resp: 15  Temp: 98 F (36.7 C)  TempSrc: Temporal  SpO2: 99%  Weight: 135 lb (61.2 kg)  Height: 5\' 2"  (1.575 m)     Physical Exam Vitals reviewed.  Constitutional:      Appearance: She is well-developed and well-nourished.  HENT:     Head: Normocephalic and atraumatic.  Eyes:     Extraocular Movements: EOM normal.     Conjunctiva/sclera: Conjunctivae normal.     Pupils: Pupils are equal, round,  and reactive to light.  Neck:     Vascular: No carotid bruit.  Cardiovascular:     Rate and Rhythm: Normal rate and regular rhythm.     Pulses: Intact distal pulses.     Heart sounds: Normal heart sounds.  Pulmonary:     Effort: Pulmonary effort is normal.     Breath sounds: Normal breath sounds.  Abdominal:     Palpations: Abdomen is soft. There is no pulsatile mass.     Tenderness: There is no abdominal tenderness.  Musculoskeletal:     Comments: Neck, C-spine nontender.  Possible slight spasm on the right paraspinal muscles without apparent focal mass or lymphadenopathy.  Equal range of motion.  Skin:    General: Skin is warm and dry.  Neurological:     Mental Status: She is alert and oriented to person, place, and time.  Psychiatric:        Mood and Affect: Mood and affect normal.        Behavior: Behavior normal.    38 minutes spent during visit, greater than 50% counseling and assimilation of information, chart review, and discussion of plan.    Assessment & Plan:  Marie Hill is a 39 y.o. female . Type 2 diabetes mellitus with hyperglycemia, without long-term current use of insulin (HCC) - Plan: Hemoglobin A1c, metFORMIN (GLUCOPHAGE) 1000 MG tablet  -Check A1c, continue same dose Metformin.  Pneumovax given.  Mixed hyperlipidemia - Plan: Comprehensive metabolic panel, Lipid panel  Check lipids, potential benefit with statin with  history of diabetes.  Vaccine counseling - Plan: Pneumococcal polysaccharide vaccine 23-valent greater than or equal to 2yo subcutaneous/IM Need for prophylactic vaccination against Streptococcus pneumoniae (pneumococcus) - Plan: Pneumococcal polysaccharide vaccine 23-valent greater than or equal to 2yo subcutaneous/IM  Infertility, female  -Unfortunately has had some persistent difficulty conceiving.  Recommended she discuss her concerns or questions regarding specific genetic testing or other testing with her fertility specialist.  She also has considering second opinion, and I am happy to place referral if needed.  Neck pain  -Suspected spasm/tension, stretches range of motion and symptomatic care were discussed with RTC precautions.  Meds ordered this encounter  Medications  . metFORMIN (GLUCOPHAGE) 1000 MG tablet    Sig: Take 1 tablet (1,000 mg total) by mouth 2 (two) times daily with a meal.    Dispense:  180 tablet    Refill:  1   Patient Instructions   I would recommend discussing genetic testing or other testing questions with your infertility specialist.  Please let me know if you do want a referral for second opinion.   Schedule appointment with eye specialist for diabetic eye screening.  Continue Metformin twice per day, I will check A1c today, but follow-up in 3 months to make sure it is stable.  Area on the back of the neck may be muscle spasm.  Can try gentle range of motion, stretches throughout the day, heat or ice to the area if needed, but follow-up within the next month if that is not improving, sooner if worse.  Thank you for coming in today and take care.   If you have lab work done today you will be contacted with your lab results within the next 2 weeks.  If you have not heard from [place] then please contact us. The fastest way to get your results is to register for My Chart.   IF you received an x-ray today, you will receive an invoice from [facility].  Please  contact [facility] at [phone] with questions or concerns regarding your invoice.   IF you received labwork today, you will receive an invoice from [place]. Please contact [facility] at [phone] with questions or concerns regarding your invoice.   Our billing staff will not be able to assist you with questions regarding bills from these companies.  You will be contacted with the lab results as soon as they are available. The fastest way to get your results is to activate your My Chart account. Instructions are located on the last page of this paperwork. If you have not heard from [place] regarding the results in 2 weeks, please contact this office.         Signed, Merri Ray, MD [facility]

## 2020-01-24 ENCOUNTER — Encounter: Payer: Self-pay | Admitting: Family Medicine

## 2020-02-04 ENCOUNTER — Encounter: Payer: Self-pay | Admitting: Radiology

## 2020-04-22 ENCOUNTER — Encounter: Payer: Self-pay | Admitting: Family Medicine

## 2020-04-22 ENCOUNTER — Other Ambulatory Visit: Payer: Self-pay

## 2020-04-22 ENCOUNTER — Ambulatory Visit: Payer: 59 | Admitting: Family Medicine

## 2020-04-22 VITALS — BP 110/79 | HR 94 | Temp 97.7°F | Resp 15 | Ht 62.0 in | Wt 137.8 lb

## 2020-04-22 DIAGNOSIS — M549 Dorsalgia, unspecified: Secondary | ICD-10-CM | POA: Diagnosis not present

## 2020-04-22 DIAGNOSIS — E1165 Type 2 diabetes mellitus with hyperglycemia: Secondary | ICD-10-CM | POA: Diagnosis not present

## 2020-04-22 DIAGNOSIS — M62838 Other muscle spasm: Secondary | ICD-10-CM

## 2020-04-22 LAB — HEMOGLOBIN A1C
Est. average glucose Bld gHb Est-mCnc: 151 mg/dL
Hgb A1c MFr Bld: 6.9 % — ABNORMAL HIGH (ref 4.8–5.6)

## 2020-04-22 MED ORDER — METFORMIN HCL 1000 MG PO TABS: 1000.0000 mg | ORAL_TABLET | Freq: Two times a day (BID) | ORAL | 1 refills | Status: DC

## 2020-04-22 NOTE — Progress Notes (Signed)
Subjective:  Patient ID: Marie Hill, female    DOB: May 17, 1980  Age: 40 y.o. MRN: [id number]  CC:  Chief Complaint  Patient presents with  . Diabetes    Pt here for 3 month recheck doing well on metformin no concerns today with this. Pt is fasting if blood work desired   . Neck Pain    Pt is still having neck and shoulder pain from working at a desk/computer for long days thgouh a cream may help or possibly seeing specialist     HPI Marie Hill presents for   Diabetes:  With history of hyperglycemia, treated with Metformin 1000 mg twice daily. Home readings  - none.  No new side effects.  No current statin - undergoing fertility treatments. Deferring statin  Microalbumin: nl 07/17/19 Optho, foot exam, pneumovax:  optho - due.   Lab Results  Component Value Date   HGBA1C 6.8 (H) 01/23/2020   HGBA1C 8.1 (H) 06/23/2019   HGBA1C 8.6 (H) 04/02/2019   Lab Results  Component Value Date   LDLCALC 154 (H) 01/23/2020   CREATININE 0.52 (L) 01/23/2020   Neck/left shoulder pain: Suspected spasm/tension, range of motion, stretches and symptomatic care discussed at her December visit.  Still having symptoms - notices after computer work.  No treatments. Sore on muscles on side of neck. Arms feel tired at end of the day, but no weakness.  No wt loss.night sweats, fever or injury.   History Patient Active Problem List   Diagnosis Date Noted  . Mass of left axilla 07/04/2016  . Miscarriage 06/30/2015  . Endometriosis of ovary 11/02/2014  . Infertility, female, primary 09/02/2014  . Hyperlipidemia 07/15/2014  . Glucose intolerance (impaired glucose tolerance) 07/15/2014   Past Medical History:  Diagnosis Date  . Dandruff   . Diabetes mellitus without complication ([place])   . 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 Glucose intolerance (impaired glucose tolerance)   . Hyperlipidemia   . Positive PPD, treated    states follow up chest x rays negative  . 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 LAPAROSCOPIC UNILATERAL SALPINGO OOPHERECTOMY N/A 09/29/2014   Procedure: LAPAROSCOPIC LEFT SALPINGO OOPHORECTOMY ADHESIOLYSIS ;  Surgeon: Everitt Amber, MD;  Location: [facility];  Service: Gynecology;  Laterality: N/A;  . ROBOTIC ASSISTED LAPAROSCOPIC OVARIAN CYSTECTOMY N/A 09/29/2014   Procedure: SI ROBOTIC ASSISTED LAPAROSCOPIC OVARIAN CYSTECTOMY;  Surgeon: Everitt Amber, MD;  Location: [facility];  Service: Gynecology;  Laterality: N/A;  . TYMPANOPLASTY Left 08/19/2019   Procedure: LEFT TYMPANOPLASTY;  Surgeon: Rozetta Nunnery, MD;  Location: [place];  Service: ENT;  Laterality: Left;   No Known Allergies Prior to Admission medications   Medication Sig Start Date End Date Taking? Authorizing Provider  metFORMIN (GLUCOPHAGE) 1000 MG tablet Take 1 tablet (1,000 mg total) by mouth 2 (two) times daily with a meal. 01/23/20  Yes Wendie Agreste, MD  Prenatal Vit-Fe Fumarate-FA (PRENATAL PO) Take 1 tablet by mouth daily.   Yes [provider]   Social History   Socioeconomic History  . Marital status: Married    Spouse name: Not on file  . Number of children: 0  . Years of education: Not on file  . Highest education level: Not on file  Occupational History  . Occupation: Metallurgist  Tobacco Use  .  Smoking status: Never Smoker  . Smokeless tobacco: Never Used  Vaping Use  . Vaping Use: Never used  Substance and Sexual Activity  . Alcohol use: Not Currently    Alcohol/week: 0.0 standard drinks  . Drug use: No  . Sexual activity: Not Currently    Partners: Male    Birth control/protection: None  Other Topics Concern  . Not on file  Social History Narrative   Marital status:  Single; together x 8 years.      Children: none      Lives: with boyfriend; from [place]; moved to [place] at age 73.      Employment:  Advertising copywriter.      Tobacco: none      Alcohol: weekends      Drugs: none      Exercise:  Sporadic      Seatbelt: 100%; no texting   Social Determinants of Health   Financial Resource Strain: Not on file  Food Insecurity: Not on file  Transportation Needs: Not on file  Physical Activity: Not on file  Stress: Not on file  Social Connections: Not on file  Intimate Partner Violence: Not on file    Review of Systems  Constitutional: Negative for fatigue and unexpected weight change.  Respiratory: Negative for chest tightness and shortness of breath.   Cardiovascular: Negative for chest pain, palpitations and leg swelling.  Gastrointestinal: Negative for abdominal pain and blood in stool.  Neurological: Negative for dizziness, syncope, light-headedness and headaches.     Objective:   Vitals:   04/22/20 1008  BP: 110/79  Pulse: 94  Resp: 15  Temp: 97.7 F (36.5 C)  TempSrc: Temporal  SpO2: 97%  Weight: 137 lb 12.8 oz (62.5 kg)  Height: 5' 2"  (1.575 m)     Physical Exam Vitals reviewed.  Constitutional:      Appearance: She is well-developed.  HENT:     Head: Normocephalic and atraumatic.  Eyes:     Conjunctiva/sclera: Conjunctivae normal.     Pupils: Pupils are equal, round, and reactive to light.  Neck:     Vascular: No carotid bruit.  Cardiovascular:     Rate and Rhythm: Normal rate and regular rhythm.     Heart sounds: Normal heart sounds.  Pulmonary:     Effort: Pulmonary effort is normal.     Breath sounds: Normal breath sounds.  Abdominal:     Palpations: Abdomen is soft. There is no pulsatile mass.     Tenderness: There is no abdominal tenderness.  Musculoskeletal:     Comments: C-spine pain-free range of motion, slight paraspinal spasm.  Normal paraspinal tenderness.  Areas of the back at paraspinals of thoracic spine.  No midline bony tenderness.  Full upper arm strength, full shoulder range of motion.  Reflexes 2+ at the biceps,  triceps, brachioradialis  Skin:    General: Skin is warm and dry.  Neurological:     Mental Status: She is alert and oriented to person, place, and time.  Psychiatric:        Mood and Affect: Mood normal.        Behavior: Behavior normal.       Assessment & Plan:  Marie Hill is a 40 y.o. female . Type 2 diabetes mellitus with hyperglycemia, without long-term current use of insulin (HCC) - Plan: Hemoglobin A1c, metFORMIN (GLUCOPHAGE) 1000 MG tablet  -Stable control last visit, check A1c, continue Metformin same dose for now with recheck 6 months  if stable A1c.  Handout given on diabetes and self-care, recommended ophthalmology eval.  Neck muscle spasm - Plan: Ambulatory referral to Physical Therapy Upper back pain - Plan: Ambulatory referral to Physical Therapy  -Likely tension, spasm in paraspinal musculature with frequent computer work.  Range of motion, change in position throughout the day but will refer to physical therapy initially.  Hold on new medicines for now.  Meds ordered this encounter  Medications  . metFORMIN (GLUCOPHAGE) 1000 MG tablet    Sig: Take 1 tablet (1,000 mg total) by mouth 2 (two) times daily with a meal.    Dispense:  180 tablet    Refill:  1   Patient Instructions   Try gentle range of motion and stretching of your neck, and upper back throughout the workday.  Try to set an alarm on your phone or other ways to remind yourself every 20 to 30 minutes to change positions which may help with soreness in the neck and upper back muscles.  I will refer you to physical therapy.  No change in medications for now.  Follow-up in 6 months.   Type 2 Diabetes Mellitus, Self-Care, Adult Caring for yourself after you have been diagnosed with type 2 diabetes (type 2 diabetes mellitus) means keeping your blood sugar (glucose) under control with a balance of:  Nutrition.  Exercise.  Lifestyle changes.  Medicines or insulin, if needed.  Support from your team of  health care providers and others. The following information explains what you need to know to manage your diabetes at home. What are the risks? Having diabetes can put you at risk for other long-term (chronic) conditions, such as heart disease and kidney disease. Your health care provider may prescribe medicines to help prevent complications from diabetes. How to monitor blood glucose  Check your blood glucose every day, as often as told by your health care provider.  Have your A1C (hemoglobin A1C) level checked two or more times a year, or as often as told by your health care provider.  Your health care provider will set personalized treatment goals for you. Generally, the goal of treatment is to maintain the following blood glucose levels: ? Before meals: 80-130 mg/dL (4.4-7.2 mmol/L). ? After meals: below 180 mg/dL (10 mmol/L). ? A1C level: less than 7%.   How to manage hyperglycemia and hypoglycemia Hyperglycemia symptoms Hyperglycemia, also called high blood glucose, occurs when blood glucose is too high. Make sure you know the early signs of hyperglycemia, such as:  Increased thirst.  Hunger.  Feeling very tired.  Needing to urinate more often than usual.  Blurry vision. Hypoglycemia symptoms Hypoglycemia, also called low blood glucose, occurs with a blood glucose level at or below 70 mg/dL (3.9 mmol/L). Diabetes medicines lower your blood glucose and can cause hypoglycemia. The risk for hypoglycemia increases during or after exercise, during sleep, during illness, and when skipping meals or not eating for a long time (fasting). It is important to know the symptoms of hypoglycemia and treat it right away. Always have a 15-gram rapid-acting carbohydrate snack with you to treat low blood glucose. Family members and close friends should also know the symptoms and understand how to treat hypoglycemia, in case you are not able to treat yourself. Symptoms may  include:  Hunger.  Anxiety.  Sweating and feeling clammy.  Dizziness or feeling light-headed.  Sleepiness.  Increased heart rate.  Irritability.  Tingling or numbness around the mouth, lips, or tongue.  Restless sleep. Severe hypoglycemia is  when your blood glucose level is at or below 54 mg/dL (3 mmol/L). Severe hypoglycemia is an emergency. Do not wait to see if the symptoms will go away. Get medical help right away. Call your local emergency services (911 in the [place] Do not drive yourself to the hospital. If you have severe hypoglycemia and you cannot eat or drink, you may need glucagon. A family member or close friend should learn how to check your blood glucose and how to give you glucagon. Ask your health care provider if you need to have an emergency glucagon kit available. Follow these instructions at home: Medicines  Take diabetes medicines as told by your health care provider. If your health care provider prescribed insulin or diabetes medicines, take them every day.  Do not run out of insulin or other diabetes medicines. Plan ahead so you always have these available.  If you use insulin, adjust your dosage based on your physical activity and what foods you eat. Your health care provider will tell you how to adjust your dosage.  Take over-the-counter and prescription medicines only as told by your health care provider. Eating and drinking What you eat and drink affects your blood glucose and your insulin dosage. Making good choices helps to control your diabetes and prevent other health problems. A healthy meal plan includes eating lean proteins, complex carbohydrates, fresh fruits and vegetables, low-fat dairy products, and healthy fats. Make an appointment to see a registered dietitian to help you create an eating plan that is right for you. Make sure that you:  Follow instructions from your health care provider about eating or drinking restrictions.  Drink enough  fluid to keep your urine pale yellow.  Keep a record of the carbohydrates that you eat. Do this by reading food labels and learning the standard serving sizes of foods.  Follow your sick-day plan whenever you cannot eat or drink as usual. Make this plan in advance with your health care provider.   Activity  Stay active. Exercise regularly, as told by your health care provider. This may include: ? Stretching and doing strength exercises, such as yoga or weight lifting, 2 or more times a week. ? Doing 150 minutes or more of moderate-intensity or vigorous-intensity exercise each week. This could be brisk walking, biking, or water aerobics.  Spread out your activity over 3 or more days of the week.  Do not go more than 2 days in a row without doing some kind of physical activity.  When you start a new exercise or activity, work with your health care provider to adjust your insulin, medicines, or food intake as needed. Lifestyle  Do not use any products that contain nicotine or tobacco, such as cigarettes, e-cigarettes, and chewing tobacco. If you need help quitting, ask your health care provider.  If your health care provider says that alcohol is safe for you, limit how much you use to no more than 1 drink a day for women who are not pregnant and 2 drinks a day for men. In the [place] one drink equals one 12 oz bottle of beer (355 mL), one 5 oz glass of wine (148 mL), or one 1 oz glass of hard liquor (44 mL).  Learn to manage stress. If you need help with this, ask your health care provider. Take care of your body  Keep your immunizations up to date. In addition to getting vaccinations as told by your health care provider, it is recommended that you get vaccinated against  the following illnesses: ? The flu (influenza). Get a flu shot every year. ? Pneumonia. ? Hepatitis B.  Schedule an eye exam soon after your diagnosis, and then one time every year after that.  Check your skin and feet  every day for cuts, bruises, redness, blisters, or sores. Schedule a foot exam with your health care provider once every year.  Brush your teeth and gums two times a day, and floss one or more times a day. Visit your dentist one or more times every 6 months.  Maintain a healthy weight.   General instructions  Share your diabetes management plan with people in your workplace, school, and household.  Carry a medical alert card or wear medical alert jewelry.  Keep all follow-up visits as told by your health care provider. This is important. Questions to ask your health care provider  Should I meet with a certified diabetes care and education specialist?  Where can I find a support group for people with diabetes? Where to find more information  American Diabetes Association (ADA): www.diabetes.org  American Association of Diabetes Care and Education Specialists (ADCES): www.diabeteseducator.org  International Diabetes Federation (IDF): MemberVerification.ca Summary  Caring for yourself after you have been diagnosed with type 2 diabetes (type 2 diabetes mellitus) means keeping your blood sugar (glucose) under control with a balance of nutrition, exercise, lifestyle changes, and medicine.  Check your blood glucose every day, as often as told by your health care provider.  Having diabetes can put you at risk for other long-term (chronic) conditions, such as heart disease and kidney disease. Your health care provider may prescribe medicines to help prevent complications from diabetes.  Share your diabetes management plan with people in your workplace, school, and household.  Keep all follow-up visits as told by your health care provider. This is important. This information is not intended to replace advice given to you by your health care provider. Make sure you discuss any questions you have with your health care provider. Document Revised: 03/10/2019 Document Reviewed: 03/11/2019 Elsevier  Patient Education  2021 [employer].   If you have lab work done today you will be contacted with your lab results within the next 2 weeks.  If you have not heard from [place] then please contact us. The fastest way to get your results is to register for My Chart.   IF you received an x-ray today, you will receive an invoice from [facility]. Please contact [facility] at [phone] with questions or concerns regarding your invoice.   IF you received labwork today, you will receive an invoice from [place]. Please contact [facility] at [phone] with questions or concerns regarding your invoice.   Our billing staff will not be able to assist you with questions regarding bills from these companies.  You will be contacted with the lab results as soon as they are available. The fastest way to get your results is to activate your My Chart account. Instructions are located on the last page of this paperwork. If you have not heard from [place] regarding the results in 2 weeks, please contact this office.         Signed, Merri Ray, MD [facility]

## 2020-04-22 NOTE — Patient Instructions (Addendum)
Try gentle range of motion and stretching of your neck, and upper back throughout the workday.  Try to set an alarm on your phone or other ways to remind yourself every 20 to 30 minutes to change positions which may help with soreness in the neck and upper back muscles.  I will refer you to physical therapy.  No change in medications for now.  Follow-up in 6 months.   Type 2 Diabetes Mellitus, Self-Care, Adult Caring for yourself after you have been diagnosed with type 2 diabetes (type 2 diabetes mellitus) means keeping your blood sugar (glucose) under control with a balance of:  Nutrition.  Exercise.  Lifestyle changes.  Medicines or insulin, if needed.  Support from your team of health care providers and others. The following information explains what you need to know to manage your diabetes at home. What are the risks? Having diabetes can put you at risk for other long-term (chronic) conditions, such as heart disease and kidney disease. Your health care provider may prescribe medicines to help prevent complications from diabetes. How to monitor blood glucose  Check your blood glucose every day, as often as told by your health care provider.  Have your A1C (hemoglobin A1C) level checked two or more times a year, or as often as told by your health care provider.  Your health care provider will set personalized treatment goals for you. Generally, the goal of treatment is to maintain the following blood glucose levels: ? Before meals: 80-130 mg/dL (4.4-7.2 mmol/L). ? After meals: below 180 mg/dL (10 mmol/L). ? A1C level: less than 7%.   How to manage hyperglycemia and hypoglycemia Hyperglycemia symptoms Hyperglycemia, also called high blood glucose, occurs when blood glucose is too high. Make sure you know the early signs of hyperglycemia, such as:  Increased thirst.  Hunger.  Feeling very tired.  Needing to urinate more often than usual.  Blurry vision. Hypoglycemia  symptoms Hypoglycemia, also called low blood glucose, occurs with a blood glucose level at or below 70 mg/dL (3.9 mmol/L). Diabetes medicines lower your blood glucose and can cause hypoglycemia. The risk for hypoglycemia increases during or after exercise, during sleep, during illness, and when skipping meals or not eating for a long time (fasting). It is important to know the symptoms of hypoglycemia and treat it right away. Always have a 15-gram rapid-acting carbohydrate snack with you to treat low blood glucose. Family members and close friends should also know the symptoms and understand how to treat hypoglycemia, in case you are not able to treat yourself. Symptoms may include:  Hunger.  Anxiety.  Sweating and feeling clammy.  Dizziness or feeling light-headed.  Sleepiness.  Increased heart rate.  Irritability.  Tingling or numbness around the mouth, lips, or tongue.  Restless sleep. Severe hypoglycemia is when your blood glucose level is at or below 54 mg/dL (3 mmol/L). Severe hypoglycemia is an emergency. Do not wait to see if the symptoms will go away. Get medical help right away. Call your local emergency services (911 in the [place] Do not drive yourself to the hospital. If you have severe hypoglycemia and you cannot eat or drink, you may need glucagon. A family member or close friend should learn how to check your blood glucose and how to give you glucagon. Ask your health care provider if you need to have an emergency glucagon kit available. Follow these instructions at home: Medicines  Take diabetes medicines as told by your health care provider. If your health care provider  prescribed insulin or diabetes medicines, take them every day.  Do not run out of insulin or other diabetes medicines. Plan ahead so you always have these available.  If you use insulin, adjust your dosage based on your physical activity and what foods you eat. Your health care provider will tell you how  to adjust your dosage.  Take over-the-counter and prescription medicines only as told by your health care provider. Eating and drinking What you eat and drink affects your blood glucose and your insulin dosage. Making good choices helps to control your diabetes and prevent other health problems. A healthy meal plan includes eating lean proteins, complex carbohydrates, fresh fruits and vegetables, low-fat dairy products, and healthy fats. Make an appointment to see a registered dietitian to help you create an eating plan that is right for you. Make sure that you:  Follow instructions from your health care provider about eating or drinking restrictions.  Drink enough fluid to keep your urine pale yellow.  Keep a record of the carbohydrates that you eat. Do this by reading food labels and learning the standard serving sizes of foods.  Follow your sick-day plan whenever you cannot eat or drink as usual. Make this plan in advance with your health care provider.   Activity  Stay active. Exercise regularly, as told by your health care provider. This may include: ? Stretching and doing strength exercises, such as yoga or weight lifting, 2 or more times a week. ? Doing 150 minutes or more of moderate-intensity or vigorous-intensity exercise each week. This could be brisk walking, biking, or water aerobics.  Spread out your activity over 3 or more days of the week.  Do not go more than 2 days in a row without doing some kind of physical activity.  When you start a new exercise or activity, work with your health care provider to adjust your insulin, medicines, or food intake as needed. Lifestyle  Do not use any products that contain nicotine or tobacco, such as cigarettes, e-cigarettes, and chewing tobacco. If you need help quitting, ask your health care provider.  If your health care provider says that alcohol is safe for you, limit how much you use to no more than 1 drink a day for women who are  not pregnant and 2 drinks a day for men. In the [place] one drink equals one 12 oz bottle of beer (355 mL), one 5 oz glass of wine (148 mL), or one 1 oz glass of hard liquor (44 mL).  Learn to manage stress. If you need help with this, ask your health care provider. Take care of your body  Keep your immunizations up to date. In addition to getting vaccinations as told by your health care provider, it is recommended that you get vaccinated against the following illnesses: ? The flu (influenza). Get a flu shot every year. ? Pneumonia. ? Hepatitis B.  Schedule an eye exam soon after your diagnosis, and then one time every year after that.  Check your skin and feet every day for cuts, bruises, redness, blisters, or sores. Schedule a foot exam with your health care provider once every year.  Brush your teeth and gums two times a day, and floss one or more times a day. Visit your dentist one or more times every 6 months.  Maintain a healthy weight.   General instructions  Share your diabetes management plan with people in your workplace, school, and household.  Carry a medical alert card or wear  medical alert jewelry.  Keep all follow-up visits as told by your health care provider. This is important. Questions to ask your health care provider  Should I meet with a certified diabetes care and education specialist?  Where can I find a support group for people with diabetes? Where to find more information  American Diabetes Association (ADA): www.diabetes.org  American Association of Diabetes Care and Education Specialists (ADCES): www.diabeteseducator.org  International Diabetes Federation (IDF): MemberVerification.ca Summary  Caring for yourself after you have been diagnosed with type 2 diabetes (type 2 diabetes mellitus) means keeping your blood sugar (glucose) under control with a balance of nutrition, exercise, lifestyle changes, and medicine.  Check your blood glucose every day, as often as  told by your health care provider.  Having diabetes can put you at risk for other long-term (chronic) conditions, such as heart disease and kidney disease. Your health care provider may prescribe medicines to help prevent complications from diabetes.  Share your diabetes management plan with people in your workplace, school, and household.  Keep all follow-up visits as told by your health care provider. This is important. This information is not intended to replace advice given to you by your health care provider. Make sure you discuss any questions you have with your health care provider. Document Revised: 03/10/2019 Document Reviewed: 03/11/2019 Elsevier Patient Education  2021 [employer].   If you have lab work done today you will be contacted with your lab results within the next 2 weeks.  If you have not heard from [place] then please contact us. The fastest way to get your results is to register for My Chart.   IF you received an x-ray today, you will receive an invoice from [facility]. Please contact [facility] at [phone] with questions or concerns regarding your invoice.   IF you received labwork today, you will receive an invoice from [facility]. Please contact [facility] at [phone] with questions or concerns regarding your invoice.   Our billing staff will not be able to assist you with questions regarding bills from these companies.  You will be contacted with the lab results as soon as they are available. The fastest way to get your results is to activate your My Chart account. Instructions are located on the last page of this paperwork. If you have not heard from [place] regarding the results in 2 weeks, please contact this office.

## 2020-05-05 ENCOUNTER — Ambulatory Visit: Payer: 59 | Attending: Family Medicine | Admitting: Physical Therapy

## 2021-01-13 ENCOUNTER — Ambulatory Visit: Payer: 59 | Admitting: Family Medicine

## 2021-04-21 ENCOUNTER — Other Ambulatory Visit: Payer: Self-pay | Admitting: Family Medicine

## 2021-04-21 DIAGNOSIS — Z1239 Encounter for other screening for malignant neoplasm of breast: Secondary | ICD-10-CM

## 2021-06-21 ENCOUNTER — Other Ambulatory Visit: Payer: Self-pay | Admitting: Family Medicine

## 2021-06-21 DIAGNOSIS — Z1231 Encounter for screening mammogram for malignant neoplasm of breast: Secondary | ICD-10-CM

## 2021-06-22 ENCOUNTER — Encounter: Payer: Self-pay | Admitting: Obstetrics & Gynecology

## 2021-07-01 ENCOUNTER — Encounter: Payer: Self-pay | Admitting: Medical

## 2021-08-01 ENCOUNTER — Other Ambulatory Visit: Payer: Self-pay | Admitting: Family Medicine

## 2021-08-01 DIAGNOSIS — Z1231 Encounter for screening mammogram for malignant neoplasm of breast: Secondary | ICD-10-CM

## 2021-08-11 ENCOUNTER — Ambulatory Visit (INDEPENDENT_AMBULATORY_CARE_PROVIDER_SITE_OTHER): Payer: 59

## 2021-08-11 DIAGNOSIS — Z1231 Encounter for screening mammogram for malignant neoplasm of breast: Secondary | ICD-10-CM

## 2021-08-31 ENCOUNTER — Encounter: Payer: Self-pay | Admitting: Radiology

## 2021-08-31 ENCOUNTER — Ambulatory Visit (INDEPENDENT_AMBULATORY_CARE_PROVIDER_SITE_OTHER): Payer: 59 | Admitting: Radiology

## 2021-08-31 ENCOUNTER — Other Ambulatory Visit (HOSPITAL_COMMUNITY)
Admission: RE | Admit: 2021-08-31 | Discharge: 2021-08-31 | Disposition: A | Discharge status: Home / Self Care | Payer: 59 | Source: Ambulatory Visit | Attending: Radiology | Admitting: Radiology

## 2021-08-31 VITALS — BP 116/78 | Ht 61.5 in | Wt 128.0 lb

## 2021-08-31 DIAGNOSIS — Z01419 Encounter for gynecological examination (general) (routine) without abnormal findings: Secondary | ICD-10-CM | POA: Diagnosis present

## 2021-08-31 DIAGNOSIS — N979 Female infertility, unspecified: Secondary | ICD-10-CM | POA: Diagnosis not present

## 2021-08-31 NOTE — Progress Notes (Signed)
   Marie Hill 1980/05/04 [id number]   History:  41 y.o. G2P0 presents for annual exam. Currently working with [facility] in hopes of becoming pregnant. She has become very frustrated with her inability to conceive. No gyn concerns.   Gynecologic History Patient's last menstrual period was 08/20/2021 (exact date). Period Cycle (Days): 28 Period Duration (Days): 3 Period Pattern: Regular Menstrual Flow: Heavy (1st day) Dysmenorrhea: (!) Moderate Dysmenorrhea Symptoms: Cramping Contraception/Family planning: none Sexually active: yes Last Pap: 2016. Results were: normal Last mammogram: 2023. Results were: normal  Obstetric History OB History  Gravida Para Term Preterm AB Living  2 0 0 0 2 0  SAB IAB Ectopic Multiple Live Births  2 0 0 0      # Outcome Date GA Lbr Len/2nd Weight Sex Delivery Anes PTL Lv  2 SAB           1 SAB              The following portions of the patient's history were reviewed and updated as appropriate: allergies, current medications, past family history, past medical history, past social history, past surgical history, and problem list.  Review of Systems Pertinent items noted in HPI and remainder of comprehensive ROS otherwise negative.   Past medical history, past surgical history, family history and social history were all reviewed and documented in the EPIC chart.   Exam:  Vitals:   08/31/21 0941  BP: 116/78  Weight: 128 lb (58.1 kg)  Height: 5' 1.5" (1.562 m)   Body mass index is 23.79 kg/m.  General appearance:  Normal Thyroid:  Symmetrical, normal in size, without palpable masses or nodularity. Respiratory  Auscultation:  Clear without wheezing or rhonchi Cardiovascular  Auscultation:  Regular rate, without rubs, murmurs or gallops  Edema/varicosities:  Not grossly evident Abdominal  Soft,nontender, without masses, guarding or rebound.  Liver/spleen:  No organomegaly noted  Hernia:  None appreciated  Skin  Inspection:   Grossly normal Breasts: Examined lying and sitting.   Right: Without masses, retractions, nipple discharge or axillary adenopathy.   Left: Without masses, retractions, nipple discharge or axillary adenopathy. Genitourinary   Inguinal/mons:  Normal without inguinal adenopathy  External genitalia:  Normal appearing vulva with no masses, tenderness, or lesions  BUS/Urethra/Skene's glands:  Normal without masses or exudate  Vagina:  Normal appearing with normal color and discharge, no lesions  Cervix:  Normal appearing without discharge or lesions  Uterus:  Normal in size, shape and contour.  Mobile, nontender  Adnexa/parametria:     Rt: Normal in size, without masses or tenderness.   Lt: Normal in size, without masses or tenderness.  Anus and perineum: Normal   Patient informed chaperone available to be present for breast and pelvic exam. Patient has requested no chaperone to be present. Patient has been advised what will be completed during breast and pelvic exam.   Assessment/Plan:   1. Well woman exam with routine gynecological exam  - Cytology - PAP( [place])  2. Infertility, female, primary Seen at [facility], considering switching to Dr Kerin Perna or going back to [place] for treatment     Discussed SBE, pap screening as directed/appropriate. Recommend 131mns of exercise weekly, including weight bearing exercise. Encouraged the use of seatbelts and sunscreen. Return in 1 year for annual or as needed.   CRubbie BattiestB WHNP-BC 10:04 AM 08/31/2021

## 2021-09-02 LAB — CYTOLOGY - PAP
Comment: NEGATIVE
Diagnosis: NEGATIVE
High risk HPV: NEGATIVE

## 2021-12-26 ENCOUNTER — Emergency Department (HOSPITAL_COMMUNITY): Payer: Commercial Managed Care - HMO

## 2021-12-26 ENCOUNTER — Inpatient Hospital Stay (HOSPITAL_COMMUNITY)
Admission: EM | Admit: 2021-12-26 | Discharge: 2021-12-31 | DRG: 862 | Disposition: A | Discharge status: Home / Self Care | Payer: Commercial Managed Care - HMO | Attending: Obstetrics and Gynecology | Admitting: Obstetrics and Gynecology

## 2021-12-26 ENCOUNTER — Other Ambulatory Visit: Payer: Self-pay

## 2021-12-26 ENCOUNTER — Encounter (HOSPITAL_COMMUNITY): Payer: Self-pay

## 2021-12-26 DIAGNOSIS — Z6821 Body mass index (BMI) 21.0-21.9, adult: Secondary | ICD-10-CM

## 2021-12-26 DIAGNOSIS — R109 Unspecified abdominal pain: Principal | ICD-10-CM

## 2021-12-26 DIAGNOSIS — Z7984 Long term (current) use of oral hypoglycemic drugs: Secondary | ICD-10-CM

## 2021-12-26 DIAGNOSIS — Y848 Other medical procedures as the cause of abnormal reaction of the patient, or of later complication, without mention of misadventure at the time of the procedure: Secondary | ICD-10-CM | POA: Diagnosis present

## 2021-12-26 DIAGNOSIS — Z8249 Family history of ischemic heart disease and other diseases of the circulatory system: Secondary | ICD-10-CM | POA: Diagnosis not present

## 2021-12-26 DIAGNOSIS — Z9079 Acquired absence of other genital organ(s): Secondary | ICD-10-CM | POA: Diagnosis not present

## 2021-12-26 DIAGNOSIS — E785 Hyperlipidemia, unspecified: Secondary | ICD-10-CM | POA: Diagnosis present

## 2021-12-26 DIAGNOSIS — Z90721 Acquired absence of ovaries, unilateral: Secondary | ICD-10-CM

## 2021-12-26 DIAGNOSIS — T8144XA Sepsis following a procedure, initial encounter: Principal | ICD-10-CM | POA: Diagnosis present

## 2021-12-26 DIAGNOSIS — Z1152 Encounter for screening for COVID-19: Secondary | ICD-10-CM | POA: Diagnosis not present

## 2021-12-26 DIAGNOSIS — E43 Unspecified severe protein-calorie malnutrition: Secondary | ICD-10-CM | POA: Diagnosis present

## 2021-12-26 DIAGNOSIS — N9489 Other specified conditions associated with female genital organs and menstrual cycle: Secondary | ICD-10-CM

## 2021-12-26 DIAGNOSIS — Z79811 Long term (current) use of aromatase inhibitors: Secondary | ICD-10-CM

## 2021-12-26 DIAGNOSIS — R102 Pelvic and perineal pain: Secondary | ICD-10-CM | POA: Diagnosis not present

## 2021-12-26 DIAGNOSIS — N80121 Deep endometriosis of right ovary: Secondary | ICD-10-CM | POA: Diagnosis present

## 2021-12-26 DIAGNOSIS — E119 Type 2 diabetes mellitus without complications: Secondary | ICD-10-CM | POA: Diagnosis present

## 2021-12-26 DIAGNOSIS — A419 Sepsis, unspecified organism: Secondary | ICD-10-CM | POA: Diagnosis present

## 2021-12-26 DIAGNOSIS — Z83438 Family history of other disorder of lipoprotein metabolism and other lipidemia: Secondary | ICD-10-CM

## 2021-12-26 DIAGNOSIS — Z79899 Other long term (current) drug therapy: Secondary | ICD-10-CM | POA: Diagnosis not present

## 2021-12-26 DIAGNOSIS — R5081 Fever presenting with conditions classified elsewhere: Secondary | ICD-10-CM | POA: Diagnosis not present

## 2021-12-26 LAB — LACTIC ACID, PLASMA
Lactic Acid, Venous: 7.2 mmol/L (ref 0.5–1.9)
Lactic Acid, Venous: 1 mmol/L (ref 0.5–1.9)

## 2021-12-26 LAB — COMPREHENSIVE METABOLIC PANEL
ALT: 59 U/L — ABNORMAL HIGH (ref 0–44)
AST: 41 U/L (ref 15–41)
Albumin: 2.4 g/dL — ABNORMAL LOW (ref 3.5–5.0)
Alkaline Phosphatase: 347 U/L — ABNORMAL HIGH (ref 38–126)
Anion gap: 17 — ABNORMAL HIGH (ref 5–15)
BUN: 11 mg/dL (ref 6–20)
CO2: 17 mmol/L — ABNORMAL LOW (ref 22–32)
Calcium: 8.2 mg/dL — ABNORMAL LOW (ref 8.9–10.3)
Chloride: 103 mmol/L (ref 98–111)
Creatinine, Ser: 0.73 mg/dL (ref 0.44–1.00)
GFR, Estimated: >60 mL/min (ref >60)
Glucose, Bld: 166 mg/dL — ABNORMAL HIGH (ref 70–99)
Potassium: 3.8 mmol/L (ref 3.5–5.1)
Sodium: 137 mmol/L (ref 135–145)
Total Bilirubin: 1.4 mg/dL — ABNORMAL HIGH (ref 0.3–1.2)
Total Protein: 7.3 g/dL (ref 6.5–8.1)

## 2021-12-26 LAB — URINALYSIS, ROUTINE W REFLEX MICROSCOPIC
Bilirubin Urine: NEGATIVE
Glucose, UA: 50 mg/dL — AB
Ketones, ur: 5 mg/dL — AB
Leukocytes,Ua: NEGATIVE
Nitrite: NEGATIVE
Protein, ur: 30 mg/dL — AB
Specific Gravity, Urine: 1.038 — ABNORMAL HIGH (ref 1.005–1.030)
pH: 6 (ref 5.0–8.0)

## 2021-12-26 LAB — CBC WITH DIFFERENTIAL/PLATELET
Abs Immature Granulocytes: 0.82 10*3/uL — ABNORMAL HIGH (ref 0.00–0.07)
Basophils Absolute: 0.2 10*3/uL — ABNORMAL HIGH (ref 0.0–0.1)
Basophils Relative: 1 %
Eosinophils Absolute: 0.1 10*3/uL (ref 0.0–0.5)
Eosinophils Relative: 0 %
HCT: 30.3 % — ABNORMAL LOW (ref 36.0–46.0)
Hemoglobin: 9.7 g/dL — ABNORMAL LOW (ref 12.0–15.0)
Immature Granulocytes: 3 %
Lymphocytes Relative: 7 %
Lymphs Abs: 1.7 10*3/uL (ref 0.7–4.0)
MCH: 25.8 pg — ABNORMAL LOW (ref 26.0–34.0)
MCHC: 32 g/dL (ref 30.0–36.0)
MCV: 80.6 fL (ref 80.0–100.0)
Monocytes Absolute: 0.3 10*3/uL (ref 0.1–1.0)
Monocytes Relative: 1 %
Neutro Abs: 22.2 10*3/uL — ABNORMAL HIGH (ref 1.7–7.7)
Neutrophils Relative %: 88 %
Platelets: 326 10*3/uL (ref 150–400)
RBC: 3.76 MIL/uL — ABNORMAL LOW (ref 3.87–5.11)
RDW: 15 % (ref 11.5–15.5)
WBC: 25.2 10*3/uL — ABNORMAL HIGH (ref 4.0–10.5)
nRBC: 0.1 % (ref 0.0–0.2)

## 2021-12-26 LAB — RESP PANEL BY RT-PCR (FLU A&B, COVID) ARPGX2
Influenza A by PCR: NEGATIVE
Influenza B by PCR: NEGATIVE
SARS Coronavirus 2 by RT PCR: NEGATIVE

## 2021-12-26 LAB — BLOOD GAS, VENOUS
Acid-base deficit: 2.5 mmol/L — ABNORMAL HIGH (ref 0.0–2.0)
Bicarbonate: 20.6 mmol/L (ref 20.0–28.0)
O2 Saturation: 90.7 %
Patient temperature: 37
pCO2, Ven: 29 mmHg — ABNORMAL LOW (ref 44–60)
pH, Ven: 7.46 — ABNORMAL HIGH (ref 7.25–7.43)
pO2, Ven: 57 mmHg — ABNORMAL HIGH (ref 32–45)

## 2021-12-26 LAB — I-STAT BETA HCG BLOOD, ED (MC, WL, AP ONLY): I-stat hCG, quantitative: <5 m[IU]/mL (ref <5)

## 2021-12-26 LAB — WET PREP, GENITAL
Sperm: NONE SEEN
Trich, Wet Prep: NONE SEEN
WBC, Wet Prep HPF POC: <10 (ref <10)
Yeast Wet Prep HPF POC: NONE SEEN

## 2021-12-26 LAB — PROTIME-INR
INR: 1.4 — ABNORMAL HIGH (ref 0.8–1.2)
Prothrombin Time: 16.7 seconds — ABNORMAL HIGH (ref 11.4–15.2)

## 2021-12-26 LAB — LIPASE, BLOOD: Lipase: 23 U/L (ref 11–51)

## 2021-12-26 LAB — APTT: aPTT: 33 seconds (ref 24–36)

## 2021-12-26 MED ORDER — IOHEXOL 300 MG/ML  SOLN
100.0000 mL | Freq: Once | INTRAMUSCULAR | Status: AC | PRN
  Administered 2021-12-26: 100 mL via INTRAVENOUS

## 2021-12-26 MED ORDER — METRONIDAZOLE 500 MG/100ML IV SOLN
500.0000 mg | Freq: Once | INTRAVENOUS | Status: AC
  Administered 2021-12-26: 500 mg via INTRAVENOUS
  Filled 2021-12-26: qty 100

## 2021-12-26 MED ORDER — ACETAMINOPHEN 325 MG PO TABS
650.0000 mg | ORAL_TABLET | Freq: Four times a day (QID) | ORAL | Status: DC | PRN
  Administered 2021-12-27 – 2021-12-31 (×7): 650 mg via ORAL
  Filled 2021-12-26 (×7): qty 2

## 2021-12-26 MED ORDER — ONDANSETRON HCL 4 MG/2ML IJ SOLN
4.0000 mg | Freq: Once | INTRAMUSCULAR | Status: AC
  Administered 2021-12-26: 4 mg via INTRAVENOUS
  Filled 2021-12-26: qty 2

## 2021-12-26 MED ORDER — LACTATED RINGERS IV SOLN: INTRAVENOUS | Status: AC

## 2021-12-26 MED ORDER — KETOROLAC TROMETHAMINE 30 MG/ML IJ SOLN
15.0000 mg | Freq: Once | INTRAMUSCULAR | Status: AC
  Administered 2021-12-26: 15 mg via INTRAVENOUS
  Filled 2021-12-26: qty 1

## 2021-12-26 MED ORDER — MORPHINE SULFATE (PF) 4 MG/ML IV SOLN
4.0000 mg | Freq: Once | INTRAVENOUS | Status: AC
  Administered 2021-12-26: 4 mg via INTRAVENOUS
  Filled 2021-12-26: qty 1

## 2021-12-26 MED ORDER — ENOXAPARIN SODIUM 40 MG/0.4ML IJ SOSY
40.0000 mg | PREFILLED_SYRINGE | INTRAMUSCULAR | Status: DC
  Administered 2021-12-26 – 2021-12-30 (×5): 40 mg via SUBCUTANEOUS
  Filled 2021-12-26 (×5): qty 0.4

## 2021-12-26 MED ORDER — SODIUM CHLORIDE (PF) 0.9 % IJ SOLN
INTRAMUSCULAR | Status: AC
  Filled 2021-12-26: qty 50

## 2021-12-26 MED ORDER — SODIUM CHLORIDE 0.9 % IV SOLN
2.0000 g | Freq: Once | INTRAVENOUS | Status: AC
  Administered 2021-12-26: 2 g via INTRAVENOUS

## 2021-12-26 MED ORDER — VANCOMYCIN HCL IN DEXTROSE 1-5 GM/200ML-% IV SOLN
1000.0000 mg | Freq: Once | INTRAVENOUS | Status: AC
  Administered 2021-12-26: 1000 mg via INTRAVENOUS
  Filled 2021-12-26: qty 200

## 2021-12-26 MED ORDER — LACTATED RINGERS IV BOLUS (SEPSIS)
1000.0000 mL | Freq: Once | INTRAVENOUS | Status: AC
  Administered 2021-12-26: 1000 mL via INTRAVENOUS

## 2021-12-26 MED ORDER — SODIUM CHLORIDE 0.9 % IV SOLN
2.0000 g | Freq: Three times a day (TID) | INTRAVENOUS | Status: DC
  Administered 2021-12-27 – 2021-12-31 (×12): 2 g via INTRAVENOUS
  Filled 2021-12-26 (×12): qty 12.5

## 2021-12-26 MED ORDER — VANCOMYCIN HCL 1250 MG/250ML IV SOLN
1250.0000 mg | INTRAVENOUS | Status: DC
  Administered 2021-12-27 – 2021-12-31 (×5): 1250 mg via INTRAVENOUS
  Filled 2021-12-26 (×6): qty 250

## 2021-12-26 MED ORDER — SODIUM CHLORIDE 0.9 % IV SOLN
2.0000 g | Freq: Once | INTRAVENOUS | Status: DC
  Administered 2021-12-26: 2 g via INTRAVENOUS
  Filled 2021-12-26: qty 20

## 2021-12-26 MED ORDER — METRONIDAZOLE 500 MG/100ML IV SOLN
500.0000 mg | Freq: Two times a day (BID) | INTRAVENOUS | Status: DC
  Administered 2021-12-27 – 2021-12-31 (×9): 500 mg via INTRAVENOUS
  Filled 2021-12-26 (×9): qty 100

## 2021-12-26 MED ORDER — SODIUM CHLORIDE 0.9 % IV SOLN
2.0000 g | Freq: Once | INTRAVENOUS | Status: AC
  Administered 2021-12-27: 2 g via INTRAVENOUS
  Filled 2021-12-26: qty 12.5

## 2021-12-26 MED ORDER — SODIUM CHLORIDE 0.9 % IV SOLN
2.0000 g | Freq: Once | INTRAVENOUS | Status: AC
  Administered 2021-12-26: 2 g via INTRAVENOUS
  Filled 2021-12-26: qty 12.5

## 2021-12-26 NOTE — H&P (Signed)
Subjective:  Patient 41 y.o. female presents with abdominal pain and fever.  Pain has been present over the last 1 to 2 weeks with increase in severity over the last 4 days.  Patient is known to me because of her history of infertility with prior history of severe endometriosis, status post left salpingo-oophorectomy and recurrence of endometrioma in the right ovary.  I performed a transvaginal ultrasound guided drainage of left adnexal and right ovarian endometriomas x2 under prophylaxis with doxycycline 200 mg p.o..  Patient has been on [place] and letrozole for suppression of endometriosis and she had continued doxycycline for 5 days post procedure.  She states the pain was: For a week but then started coming back.  She was recently seen in my office with recurrence of pain and ultrasound findings showed slight enlargement in the right ovarian endometrioma.  She was treated with endometriosis suppressing medication such as letrozole and norethindrone acetate since she had run out of [place] and had insurance issues for reauthorization. She denies vaginal bleeding, nausea or vomiting.   Patient Active Problem List   Diagnosis Date Noted   Sepsis ([place]) 12/26/2021   Mass of left axilla 07/04/2016   Miscarriage 06/30/2015   Endometriosis of ovary 11/02/2014   Infertility, female, primary 09/02/2014   Hyperlipidemia 07/15/2014   Glucose intolerance (impaired glucose tolerance) 07/15/2014   Past Medical History:  Diagnosis Date   Dandruff    Diabetes mellitus without complication ([place])    Endometriosis 2016   Stage IV/LSO   Glucose intolerance (impaired glucose tolerance)    Hyperlipidemia    Positive PPD, treated    states follow up chest x rays negative   PPD positive 1997,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   no meds 1997, treated in June 2016 x 1 month- states neg chest x ray June 2016    Past Surgical History:  Procedure Laterality Date   LAPAROSCOPIC UNILATERAL SALPINGO OOPHERECTOMY N/A 09/29/2014    Procedure: LAPAROSCOPIC LEFT SALPINGO OOPHORECTOMY ADHESIOLYSIS ;  Surgeon: Everitt Amber, MD;  Location: [facility];  Service: Gynecology;  Laterality: N/A;   ROBOTIC ASSISTED LAPAROSCOPIC OVARIAN CYSTECTOMY N/A 09/29/2014   Procedure: SI ROBOTIC ASSISTED LAPAROSCOPIC OVARIAN CYSTECTOMY;  Surgeon: Everitt Amber, MD;  Location: [facility];  Service: Gynecology;  Laterality: N/A;   TYMPANOPLASTY Left 08/19/2019   Procedure: LEFT TYMPANOPLASTY;  Surgeon: Rozetta Nunnery, MD;  Location: [facility];  Service: ENT;  Laterality: Left;    (Not in a hospital admission)  No Known Allergies  Social History   Tobacco Use   Smoking status: Never   Smokeless tobacco: Never  Substance Use Topics   Alcohol use: Not Currently    Alcohol/week: 0.0 standard drinks of alcohol    Family History  Problem Relation Age of Onset   Hyperlipidemia Mother    Hypertension Mother    Hyperlipidemia Father     Review of Systems Pertinent items are noted in HPI.  Objective:   Patient Vitals for the past 8 hrs:  BP Temp Temp src Pulse Resp SpO2  12/26/21 2000 -- 97.9 F (36.6 C) Oral -- -- --  12/26/21 1900 (!) 103/58 -- -- 80 13 96 %  12/26/21 1541 91/74 97.8 F (36.6 C) Oral 75 18 96 %  12/26/21 1540 91/74 -- -- -- -- 96 %  12/26/21 1538 -- 97.8 F (36.6 C) Oral -- -- --  12/26/21 1530 -- -- -- 71 17 93 %  12/26/21 1515 -- -- -- 76 20 94 %  12/26/21 1500 -- -- --  77 19 94 %  12/26/21 1445 -- -- -- 79 17 95 %  12/26/21 1430 -- -- -- 80 18 94 %  12/26/21 1415 -- -- -- 80 19 94 %  12/26/21 1400 -- -- -- 76 16 96 %   BP 110/62   Pulse 94   Temp 97.9 F (36.6 C) (Oral)   Resp 15   SpO2 98%   General Appearance:    Alert, cooperative, no distress, appears stated age  Head:    Normocephalic, without obvious abnormality, atraumatic  Eyes:    PERRL, conjunctiva/corneas clear, EOM's intact, fundi    benign, both eyes  Ears:    Normal TM's and external ear canals, both ears  Nose:   Nares  normal, septum midline, mucosa normal, no drainage    or sinus tenderness  Throat:   Lips, mucosa, and tongue normal; teeth and gums normal  Neck:   Supple, symmetrical, trachea midline, no adenopathy;    thyroid:  no enlargement/tenderness/nodules; no carotid   bruit or JVD  Back:     Symmetric, no curvature, ROM normal, no CVA tenderness  Lungs:     Clear to auscultation bilaterally, respirations unlabored  Chest Wall:    No tenderness or deformity   Heart:    Regular rate and rhythm, S1 and S2 normal, no murmur, rub   or gallop  Breast Exam:    No tenderness, masses, or nipple abnormality  Abdomen:     Soft, tender in right lower quadrant, no rebound, bowel sounds active all four quadrants,  no masses, no organomegaly  Genitalia:    Normal female without lesion, discharge or tenderness     Extremities:   Extremities normal, atraumatic, no cyanosis or edema  Pulses:   2+ and symmetric all extremities  Skin:   Skin color, texture, turgor normal, no rashes or lesions  Lymph nodes:   Cervical, supraclavicular, and axillary nodes normal  Neurologic:   CNII-XII intact, normal strength, sensation and reflexes    throughout    Data ReviewCBC:  Lab Results  Component Value Date   WBC 25.2 (H) 12/26/2021   RBC 3.76 (L) 12/26/2021   BMP:  Lab Results  Component Value Date   GLUCOSE 166 (H) 12/26/2021   CO2 17 (L) 12/26/2021   BUN 11 12/26/2021   BUN 9 01/23/2020   CREATININE 0.73 12/26/2021   CREATININE 0.54 10/12/2014   CALCIUM 8.2 (L) 12/26/2021   Radiology review: Right adnexal complex mass measuring 9 cm in its largest dimension.  I personally reviewed the transvaginal ultrasound and CT of the abdomen and pelvis images.  I agree that the nature of the right adnexal 3 cm endometrioma has changed towards a more complex ovarian mass, and it could be an evolving tubo-ovarian abscess. Assessment:  Principal Problem:   Sepsis ([place]) Probable tubo-ovarian complex in formation 1 month  after transvaginal aspiration attempt of bilateral adnexal endometriomas Desire to preserve her childbearing potential  Plan: Will admit for inpatient treatment with IV antibiotics and for close monitoring of vital signs I agree with the choice of antibiotics for now but will also obtain ID consultation. Blood cultures pending I discussed with the patient that eventual laparoscopic bilateral nephrectomy either by me or by the patient's original surgeon Dr. Denman George may become necessary. If patient's clinical status worsens this may be done within 24 to 48 hours otherwise will await till 7 to 10 days of antibiotics are in.

## 2021-12-26 NOTE — ED Provider Notes (Signed)
[facility] DEPT Provider Note   CSN: [id number] Arrival date & time: 12/26/21  1022     History  Chief Complaint  Patient presents with   Post-op Problem    Marie Hill is a 41 y.o. female.  With PMH of non-insulin-dependent diabetes, endometriosis s/p recent endometriosis procedure approximately 2 weeks prior with Dr Governor Specking of [facility] who presents with 1 week of persistent fevers, lower abdominal pain and vomiting.  Patient said about 2 weeks ago she had specialized endometriosis surgery through her vagina to drain endometriosis from her right ovarian region about 2 weeks ago.  She has had previous surgeries on the site with ovarian cystectomy.  She said she was supposed to have about 30 cc drained from it but only had 15 cc drained.  About 4 to 5 days after the procedure she developed increasing right lower quadrant abdominal pain associate with nausea and persistent nonbloody nonbilious emesis.  She has been unable to keep water down.  She has been unable to take her metformin for diabetes.  She is not currently on any hormonal therapy and is not on blood thinners.  She has had normal bowel movements.  She denies any cough or URI symptoms.  She does complain of burning pain with urination but no history of UTI or kidney disease.  She has had no abnormal vaginal discharge.  Her last menstrual period was a bit over a month ago.  She is currently not sexually active over the past 2 months due to her endometriosis issues. Denies CP or SOB. She has not been on antibiotics.  HPI     Home Medications Prior to Admission medications   Medication Sig Start Date End Date Taking? Authorizing Provider  ibuprofen (ADVIL) 200 MG tablet Take 400 mg by mouth every 6 (six) hours as needed for mild pain.   Yes [provider]  JARDIANCE 25 MG TABS tablet Take 25 mg by mouth daily. 11/23/21  Yes [provider]  letrozole  (FEMARA) 2.5 MG tablet Take 2.5 mg by mouth daily. 12/19/21  Yes [provider]  metFORMIN (GLUCOPHAGE-XR) 500 MG 24 hr tablet Take 1,000 mg by mouth 2 (two) times daily. 12/22/21  Yes [provider]  norethindrone (AYGESTIN) 5 MG tablet Take 5 mg by mouth daily. 12/19/21  Yes [provider]  rosuvastatin (CRESTOR) 40 MG tablet Take 40 mg by mouth daily. 11/23/21  Yes [provider]      Allergies    Patient has no known allergies.    Review of Systems   Review of Systems  Physical Exam Updated Vital Signs BP 91/74 (BP Location: Right Arm)   Pulse 75   Temp 97.8 F (36.6 C) (Oral)   Resp 18   SpO2 96%  Physical Exam Constitutional: Alert and oriented.  Ill-appearing Eyes: Conjunctivae are normal. ENT      Head: Normocephalic and atraumatic.      Nose: No congestion.      Mouth/Throat: Mucous membranes are dry and lips are peeling.      Neck: No stridor. Cardiovascular: S1, S2, tachycardic, regular rhythm, warm well perfused Respiratory: Mild tachypnea, breath sounds are clear, O2 sat 97 on RA Gastrointestinal: Soft and nondistended with right lower quadrant and suprapubic tenderness to palpation, no rebound or guarding Musculoskeletal: Normal range of motion in all extremities.      Right lower leg: No tenderness or edema.      Left lower leg: No  tenderness or edema. Neurologic: Normal speech and language. No gross focal neurologic deficits are appreciated. Skin: Skin is warm, dry and intact. No rash noted. Psychiatric: Mood and affect are normal. Speech and behavior are normal.  ED Results / Procedures / Treatments   Labs (all labs ordered are listed, but only abnormal results are displayed) Labs Reviewed  WET PREP, GENITAL - Abnormal; Notable for the following components:      Result Value   Clue Cells Wet Prep HPF POC PRESENT (*)    All other components within normal limits  LACTIC ACID, PLASMA - Abnormal; Notable for the following  components:   Lactic Acid, Venous 7.2 (*)    All other components within normal limits  COMPREHENSIVE METABOLIC PANEL - Abnormal; Notable for the following components:   CO2 17 (*)    Glucose, Bld 166 (*)    Calcium 8.2 (*)    Albumin 2.4 (*)    ALT 59 (*)    Alkaline Phosphatase 347 (*)    Total Bilirubin 1.4 (*)    Anion gap 17 (*)    All other components within normal limits  CBC WITH DIFFERENTIAL/PLATELET - Abnormal; Notable for the following components:   WBC 25.2 (*)    RBC 3.76 (*)    Hemoglobin 9.7 (*)    HCT 30.3 (*)    MCH 25.8 (*)    Neutro Abs 22.2 (*)    Basophils Absolute 0.2 (*)    Abs Immature Granulocytes 0.82 (*)    All other components within normal limits  PROTIME-INR - Abnormal; Notable for the following components:   Prothrombin Time 16.7 (*)    INR 1.4 (*)    All other components within normal limits  BLOOD GAS, VENOUS - Abnormal; Notable for the following components:   pH, Ven 7.46 (*)    pCO2, Ven 29 (*)    pO2, Ven 57 (*)    Acid-base deficit 2.5 (*)    All other components within normal limits  RESP PANEL BY RT-PCR (FLU A&B, COVID) ARPGX2  CULTURE, BLOOD (ROUTINE X 2)  CULTURE, BLOOD (ROUTINE X 2)  URINE CULTURE  LACTIC ACID, PLASMA  APTT  LIPASE, BLOOD  URINALYSIS, ROUTINE W REFLEX MICROSCOPIC  I-STAT BETA HCG BLOOD, ED ([facility], [facility], [facility] ONLY)  GC/CHLAMYDIA PROBE AMP ([place]) NOT AT [facility]    EKG EKG Interpretation  Date/Time:  Monday December 26 2021 10:54:16 EST Ventricular Rate:  116 PR Interval:  120 QRS Duration: 84 QT Interval:  341 QTC Calculation: 474 R Axis:   86 Text Interpretation: Sinus tachycardia Borderline low voltage, extremity leads nonspecific t wave change anterior leads and inferior leads similar to previous Confirmed by Georgina Snell [phone]) on 12/26/2021 11:05:47 AM  Radiology US Pelvis Complete  Result Date: 12/26/2021 CLINICAL DATA:  Right adnexal mass seen in previous CT done earlier today EXAM:  TRANSABDOMINAL AND TRANSVAGINAL ULTRASOUND OF PELVIS DOPPLER ULTRASOUND OF OVARIES TECHNIQUE: Both transabdominal and transvaginal ultrasound examinations of the pelvis were performed. Transabdominal technique was performed for global imaging of the pelvis including uterus, ovaries, adnexal regions, and pelvic cul-de-sac. It was necessary to proceed with endovaginal exam following the transabdominal exam to visualize the endometrium and ovaries. Color and duplex Doppler ultrasound was utilized to evaluate blood flow to the ovaries. COMPARISON:  Previous studies including the CT done earlier today and pelvic sonogram done on 05/15/2017 FINDINGS: Uterus Measurements: 9.5 x 5.5 x 5.8 cm = volume: 157.7 mL. There is slightly inhomogeneous echogenicity in  myometrium. Nabothian cysts are seen in cervix. There is possible calcification in the margin of 1 of the nabothian cysts. Endometrium Thickness: 12 mm.  No focal abnormality visualized. Right ovary Measurements: 9.7 x 7.4 x 6.6 cm = volume: 244.9 mL. There is inhomogeneous echogenicity with multiple hypoechoic lesions of varying sizes largest measuring 5 x 6.6 cm. There are other smaller lesions in the right ovary. There is arterial and venous flow within the right ovary in color flow and pulsed Doppler examination. Left ovary Not sonographically visualized consistent with left oophorectomy. Pulsed Doppler evaluation of right ovary demonstrates normal low-resistance arterial and venous waveforms. Other findings No abnormal free fluid. IMPRESSION: There is marked enlargement of right ovary. There are multiple smooth marginated lesions of varying sizes largest measuring 6.6 cm in maximum diameter. Findings suggestive multiple hemorrhagic right ovarian cysts. There is no evidence of right ovarian torsion. Left ovary is surgically absent. There is inhomogeneous echogenicity in myometrium without no discrete focal abnormalities. Electronically Signed   By: Elmer Picker M.D.   On: 12/26/2021 15:04   US Transvaginal Non-OB  Result Date: 12/26/2021 CLINICAL DATA:  Right adnexal mass seen in previous CT done earlier today EXAM: TRANSABDOMINAL AND TRANSVAGINAL ULTRASOUND OF PELVIS DOPPLER ULTRASOUND OF OVARIES TECHNIQUE: Both transabdominal and transvaginal ultrasound examinations of the pelvis were performed. Transabdominal technique was performed for global imaging of the pelvis including uterus, ovaries, adnexal regions, and pelvic cul-de-sac. It was necessary to proceed with endovaginal exam following the transabdominal exam to visualize the endometrium and ovaries. Color and duplex Doppler ultrasound was utilized to evaluate blood flow to the ovaries. COMPARISON:  Previous studies including the CT done earlier today and pelvic sonogram done on 05/15/2017 FINDINGS: Uterus Measurements: 9.5 x 5.5 x 5.8 cm = volume: 157.7 mL. There is slightly inhomogeneous echogenicity in myometrium. Nabothian cysts are seen in cervix. There is possible calcification in the margin of 1 of the nabothian cysts. Endometrium Thickness: 12 mm.  No focal abnormality visualized. Right ovary Measurements: 9.7 x 7.4 x 6.6 cm = volume: 244.9 mL. There is inhomogeneous echogenicity with multiple hypoechoic lesions of varying sizes largest measuring 5 x 6.6 cm. There are other smaller lesions in the right ovary. There is arterial and venous flow within the right ovary in color flow and pulsed Doppler examination. Left ovary Not sonographically visualized consistent with left oophorectomy. Pulsed Doppler evaluation of right ovary demonstrates normal low-resistance arterial and venous waveforms. Other findings No abnormal free fluid. IMPRESSION: There is marked enlargement of right ovary. There are multiple smooth marginated lesions of varying sizes largest measuring 6.6 cm in maximum diameter. Findings suggestive multiple hemorrhagic right ovarian cysts. There is no evidence of right ovarian  torsion. Left ovary is surgically absent. There is inhomogeneous echogenicity in myometrium without no discrete focal abnormalities. Electronically Signed   By: Elmer Picker M.D.   On: 12/26/2021 15:04   [place] Art/Ven Flow Abd Pelv Doppler  Result Date: 12/26/2021 CLINICAL DATA:  Right adnexal mass seen in previous CT done earlier today EXAM: TRANSABDOMINAL AND TRANSVAGINAL ULTRASOUND OF PELVIS DOPPLER ULTRASOUND OF OVARIES TECHNIQUE: Both transabdominal and transvaginal ultrasound examinations of the pelvis were performed. Transabdominal technique was performed for global imaging of the pelvis including uterus, ovaries, adnexal regions, and pelvic cul-de-sac. It was necessary to proceed with endovaginal exam following the transabdominal exam to visualize the endometrium and ovaries. Color and duplex Doppler ultrasound was utilized to evaluate blood flow to the ovaries. COMPARISON:  Previous studies including the  CT done earlier today and pelvic sonogram done on 05/15/2017 FINDINGS: Uterus Measurements: 9.5 x 5.5 x 5.8 cm = volume: 157.7 mL. There is slightly inhomogeneous echogenicity in myometrium. Nabothian cysts are seen in cervix. There is possible calcification in the margin of 1 of the nabothian cysts. Endometrium Thickness: 12 mm.  No focal abnormality visualized. Right ovary Measurements: 9.7 x 7.4 x 6.6 cm = volume: 244.9 mL. There is inhomogeneous echogenicity with multiple hypoechoic lesions of varying sizes largest measuring 5 x 6.6 cm. There are other smaller lesions in the right ovary. There is arterial and venous flow within the right ovary in color flow and pulsed Doppler examination. Left ovary Not sonographically visualized consistent with left oophorectomy. Pulsed Doppler evaluation of right ovary demonstrates normal low-resistance arterial and venous waveforms. Other findings No abnormal free fluid. IMPRESSION: There is marked enlargement of right ovary. There are multiple smooth  marginated lesions of varying sizes largest measuring 6.6 cm in maximum diameter. Findings suggestive multiple hemorrhagic right ovarian cysts. There is no evidence of right ovarian torsion. Left ovary is surgically absent. There is inhomogeneous echogenicity in myometrium without no discrete focal abnormalities. Electronically Signed   By: Elmer Picker M.D.   On: 12/26/2021 15:04   CT ABDOMEN PELVIS W CONTRAST  Result Date: 12/26/2021 CLINICAL DATA:  Right lower quadrant abdominal pain with vomiting and fever for 2 weeks following surgery for endometriosis. EXAM: CT CHEST, ABDOMEN, AND PELVIS WITH CONTRAST TECHNIQUE: Multidetector CT imaging of the chest, abdomen and pelvis was performed following the standard protocol during bolus administration of intravenous contrast. RADIATION DOSE REDUCTION: This exam was performed according to the departmental dose-optimization program which includes automated exposure control, adjustment of the mA and/or kV according to patient size and/or use of iterative reconstruction technique. CONTRAST:  175m OMNIPAQUE IOHEXOL 300 MG/ML  SOLN COMPARISON:  Abdominopelvic CT 09/21/2014. Chest radiographs 12/26/2021. FINDINGS: CT CHEST FINDINGS Cardiovascular: No significant vascular findings. The heart size is normal. There is no pericardial effusion. Mediastinum/Nodes: There are no enlarged mediastinal, hilar or axillary lymph nodes. The thyroid gland, trachea and esophagus demonstrate no significant findings. Lungs/Pleura: There is no pleural effusion. The lungs are clear. Musculoskeletal/Chest wall: No chest wall mass or suspicious osseous findings. CT ABDOMEN AND PELVIS FINDINGS Hepatobiliary: The liver is normal in density without suspicious focal abnormality. Focal fat in the left hepatic lobe adjacent to the falciform ligament, similar to previous study. No evidence of gallstones, gallbladder wall thickening or biliary dilatation. Pancreas: Unremarkable. No pancreatic  ductal dilatation or surrounding inflammatory changes. Spleen: Normal in size without focal abnormality. Adrenals/Urinary Tract: Both adrenal glands appear normal. No evidence of urinary tract calculus, suspicious renal lesion or hydronephrosis. The bladder appears normal for its degree of distention. Stomach/Bowel: No enteric contrast administered. The stomach appears unremarkable for its degree of distension. No evidence of bowel wall thickening, distention or surrounding inflammatory change. The appendix is not clearly demonstrated. Vascular/Lymphatic: There are multiple new mildly prominent retroperitoneal lymph nodes which are likely reactive. No pelvic adenopathy. No significant vascular findings. Reproductive: There is a complex, multi-septated cystic right adnexal mass which measures 9.2 x 6.2 x 7.0 cm. This mass is new compared with the previous CT and demonstrates thickened septations. No left adnexal mass or suspicious uterine findings. Small cervical nabothian cysts are noted. Other: Mild pelvic soft tissue edema without focal fluid collection, ascites or free air. Musculoskeletal: No acute or significant osseous findings. IMPRESSION: 1. Complex, multi-septated cystic right adnexal mass measuring up to 9.2  cm in diameter, new compared with previous CT from 2016. Appearance is nonspecific and could reflect an atypical endometrioma or tubo-ovarian abscess. Correlate with recent surgical procedure/findings and presumed outside imaging. Neoplasm not excluded by this examination. Ultrasound may be helpful for further characterization. 2. Mild pelvic soft tissue edema without focal fluid collection, ascites or free air. The appendix is not clearly demonstrated. 3. Mildly prominent retroperitoneal lymph nodes, likely reactive. 4. No acute findings in the chest. Electronically Signed   By: Richardean Sale M.D.   On: 12/26/2021 13:23   CT Chest W Contrast  Result Date: 12/26/2021 CLINICAL DATA:  Right lower  quadrant abdominal pain with vomiting and fever for 2 weeks following surgery for endometriosis. EXAM: CT CHEST, ABDOMEN, AND PELVIS WITH CONTRAST TECHNIQUE: Multidetector CT imaging of the chest, abdomen and pelvis was performed following the standard protocol during bolus administration of intravenous contrast. RADIATION DOSE REDUCTION: This exam was performed according to the departmental dose-optimization program which includes automated exposure control, adjustment of the mA and/or kV according to patient size and/or use of iterative reconstruction technique. CONTRAST:  189m OMNIPAQUE IOHEXOL 300 MG/ML  SOLN COMPARISON:  Abdominopelvic CT 09/21/2014. Chest radiographs 12/26/2021. FINDINGS: CT CHEST FINDINGS Cardiovascular: No significant vascular findings. The heart size is normal. There is no pericardial effusion. Mediastinum/Nodes: There are no enlarged mediastinal, hilar or axillary lymph nodes. The thyroid gland, trachea and esophagus demonstrate no significant findings. Lungs/Pleura: There is no pleural effusion. The lungs are clear. Musculoskeletal/Chest wall: No chest wall mass or suspicious osseous findings. CT ABDOMEN AND PELVIS FINDINGS Hepatobiliary: The liver is normal in density without suspicious focal abnormality. Focal fat in the left hepatic lobe adjacent to the falciform ligament, similar to previous study. No evidence of gallstones, gallbladder wall thickening or biliary dilatation. Pancreas: Unremarkable. No pancreatic ductal dilatation or surrounding inflammatory changes. Spleen: Normal in size without focal abnormality. Adrenals/Urinary Tract: Both adrenal glands appear normal. No evidence of urinary tract calculus, suspicious renal lesion or hydronephrosis. The bladder appears normal for its degree of distention. Stomach/Bowel: No enteric contrast administered. The stomach appears unremarkable for its degree of distension. No evidence of bowel wall thickening, distention or surrounding  inflammatory change. The appendix is not clearly demonstrated. Vascular/Lymphatic: There are multiple new mildly prominent retroperitoneal lymph nodes which are likely reactive. No pelvic adenopathy. No significant vascular findings. Reproductive: There is a complex, multi-septated cystic right adnexal mass which measures 9.2 x 6.2 x 7.0 cm. This mass is new compared with the previous CT and demonstrates thickened septations. No left adnexal mass or suspicious uterine findings. Small cervical nabothian cysts are noted. Other: Mild pelvic soft tissue edema without focal fluid collection, ascites or free air. Musculoskeletal: No acute or significant osseous findings. IMPRESSION: 1. Complex, multi-septated cystic right adnexal mass measuring up to 9.2 cm in diameter, new compared with previous CT from 2016. Appearance is nonspecific and could reflect an atypical endometrioma or tubo-ovarian abscess. Correlate with recent surgical procedure/findings and presumed outside imaging. Neoplasm not excluded by this examination. Ultrasound may be helpful for further characterization. 2. Mild pelvic soft tissue edema without focal fluid collection, ascites or free air. The appendix is not clearly demonstrated. 3. Mildly prominent retroperitoneal lymph nodes, likely reactive. 4. No acute findings in the chest. Electronically Signed   By: WRichardean SaleM.D.   On: 12/26/2021 13:23   DG Chest Port 1 View  Result Date: 12/26/2021 CLINICAL DATA:  Questionable sepsis, evaluate for abnormality. Nausea and vomiting. Fevers. Gyn surgery 2  weeks ago. EXAM: PORTABLE CHEST 1 VIEW COMPARISON:  None Available. FINDINGS: The heart size and mediastinal contours are within normal limits. Both lungs are clear. The visualized skeletal structures are unremarkable. IMPRESSION: Negative one-view chest x-ray. Electronically Signed   By: San Morelle M.D.   On: 12/26/2021 11:17    Procedures .Critical Care  Performed by: Elgie Congo, MD Authorized by: Elgie Congo, MD   Critical care provider statement:    Critical care time (minutes):  40   Critical care was necessary to treat or prevent imminent or life-threatening deterioration of the following conditions:  Shock and sepsis   Critical care was time spent personally by me on the following activities:  Development of treatment plan with patient or surrogate, discussions with consultants, evaluation of patient's response to treatment, examination of patient, ordering and review of laboratory studies, ordering and review of radiographic studies, ordering and performing treatments and interventions, pulse oximetry, re-evaluation of patient's condition and review of old charts     Medications Ordered in ED Medications  lactated ringers infusion ( Intravenous New Bag/Given 12/26/21 1157)  acetaminophen (TYLENOL) tablet 650 mg (has no administration in time range)  ceFEPIme (MAXIPIME) 2 g in sodium chloride 0.9 % 100 mL IVPB (has no administration in time range)  metroNIDAZOLE (FLAGYL) IVPB 500 mg (has no administration in time range)  lactated ringers bolus 1,000 mL (1,000 mLs Intravenous New Bag/Given 12/26/21 1130)    And  lactated ringers bolus 1,000 mL (1,000 mLs Intravenous New Bag/Given 12/26/21 1159)  metroNIDAZOLE (FLAGYL) IVPB 500 mg (500 mg Intravenous New Bag/Given 12/26/21 1140)  morphine (PF) 4 MG/ML injection 4 mg (4 mg Intravenous Given 12/26/21 1159)  ondansetron (ZOFRAN) injection 4 mg (4 mg Intravenous Given 12/26/21 1159)  ketorolac (TORADOL) 30 MG/ML injection 15 mg (15 mg Intravenous Given 12/26/21 1159)  vancomycin (VANCOCIN) IVPB 1000 mg/200 mL premix (1,000 mg Intravenous New Bag/Given 12/26/21 1300)  ceFEPIme (MAXIPIME) 2 g in sodium chloride 0.9 % 100 mL IVPB (2 g Intravenous New Bag/Given 12/26/21 1230)  iohexol (OMNIPAQUE) 300 MG/ML solution 100 mL (100 mLs Intravenous Contrast Given 12/26/21 1244)  sodium chloride (PF) 0.9 %  injection (  Given 12/26/21 1438)    ED Course/ Medical Decision Making/ A&P Clinical Course as of 12/26/21 1730  Mon Dec 26, 2021  1353 Spoke with Dr Kerin Perna who request admission with continued IV fluids and antibiotics and he will see tonight for evaluation to determine if she needs surgery.  Updated patient on findings.  She remains stable not requiring pressors. [VB]  9628   Paged for admission. [VB]  31 Spoke with Dr Marylyn Ishihara hospitalist who declines admission since this is a surgical complication and requires to be on surgical service [VB]  1450 I have tried calling Dr. Kerin Perna 2 times since initial phone call conversation to let him know he needs to put in admission orders.  I have been unable to get further contact with him.  I have left a message requesting call back, [VB]    Clinical Course User Index [VB] Elgie Congo, MD                           Medical Decision Making Quiara Killian is a 41 y.o. female.  With PMH of non-insulin-dependent diabetes, endometriosis s/p recent endometriosis procedure approximately 2 weeks prior with Dr Governor Specking of [facility] who presents with 1 week of persistent fevers, lower  abdominal pain and vomiting.   Patient received 700 cc IV fluids with EMS and 650 mg p.o. Tylenol.  Patient presented tachycardic 119 with stable blood pressure 118/68 and with a fever 39.3 C satting 97% on room air.  Concern for sepsis secondary to likely complication from recent intravaginal surgery, possible ovarian abscess, endometritis, UTI among multiple other possibilities.  Chest x-ray was obtained upon arrival which I personally reviewed which showed no consolidation concerning for pneumonia no pneumothorax.  Patient started on IV sepsis fluids 30 cc/kg bolus with broad-spectrum antibiotics vancomycin, cefepime and Flagyl.  Labs obtained and reviewed which I personally saw included Leukocytosis 25.2, an anion gap acidosis with  bicarbonate 17 anion gap 17 with glucose 166.  Suspect secondary to lactic acidosis from sepsis with lactate 7.2 as well as some starvation ketoacidosis.  Less likely DKA with glucose 166.  Consider influence from metformin use however she has been unable to keep anything down vomiting.  Also has a transaminitis ALT 59 alk phos 347.  CT CAP with IV contrast obtained which I personally reviewed which showed large multiloculated right adnexal mass concerning for TOA vs infected atypical endometrioma with associated Mild pelvic soft tissue edema without focal fluid collection, ascites or free air concerning for possible endometritis.  Urine is still pending.  Order for ultrasound for further evaluation and of ovaries but less concern for torsion.  Spoke with Dr. Kerin Perna who will see patient tonight to determine if she requires surgery and drainage of right adnexal mass.  We will continue IV fluids and antibiotics and admit to Gynecology.     Amount and/or Complexity of Data Reviewed Labs: ordered. Radiology: ordered. ECG/medicine tests: ordered.  Risk OTC drugs. Prescription drug management. Decision regarding hospitalization.    Final Clinical Impression(s) / ED Diagnoses Final diagnoses:  Abdominal pain, unspecified abdominal location  Sepsis, due to unspecified organism, unspecified whether acute organ dysfunction present [facility])  Adnexal mass    Rx / DC Orders ED Discharge Orders     None         Elgie Congo, MD 12/26/21 1731

## 2021-12-26 NOTE — Sepsis Progress Note (Signed)
Sepsis protocol is being followed by [facility]. 

## 2021-12-26 NOTE — ED Provider Notes (Signed)
  Provider Note MRN:  [id number]  Arrival date & time: 12/26/21    ED Course and Medical Decision Making  Assumed care from [facility] at shift change.  See note from prior team for complete details, in brief:  41 yo female Hx DM, endometriosis s/p surgery 2 wks prior with Dr [name] of [facility]  Fever x1 week, worsening RLQ pain w/ nausea Prior team spoke with Dr Kerin Perna [phone]), need to speak with him again for admission (hospitalist refused given surgical complaint/post op) WBC 25, Hgb 9.7. febrile on arrival, tachycardic; sepsis She also has clue cells on wet prep CT w/ ?TOA, no torsion on u/s Vanc/cefepine/flagyl started Blood ctx Sepsis protocol HDS  Spoke with Dr Kerin Perna, will accept the patient to his service as primary.  Continue current abx, pt is HDS.  .Critical Care  Performed by: Jeanell Sparrow, DO Authorized by: Jeanell Sparrow, DO   Critical care provider statement:    Critical care time (minutes):  30   Critical care time was exclusive of:  Separately billable procedures and treating other patients   Critical care was necessary to treat or prevent imminent or life-threatening deterioration of the following conditions:  Sepsis   Critical care was time spent personally by me on the following activities:  Development of treatment plan with patient or surrogate, discussions with consultants, evaluation of patient's response to treatment, examination of patient, ordering and review of laboratory studies, ordering and review of radiographic studies, ordering and performing treatments and interventions, pulse oximetry, re-evaluation of patient's condition and review of old charts   Final Clinical Impressions(s) / ED Diagnoses     ICD-10-CM   1. Abdominal pain, unspecified abdominal location  R10.9     2. Sepsis, due to unspecified organism, unspecified whether acute organ dysfunction present ([place])  A41.9     3. Adnexal mass   N94.89       ED Discharge Orders     None       Discharge Instructions   None        Jeanell Sparrow, DO 12/26/21 1615

## 2021-12-26 NOTE — Progress Notes (Signed)
A consult was received from an ED physician to continue vancomycin, Cefepime, and Flagyl while in ED per pharmacy dosing.  The patient's profile has been reviewed for ht/wt/allergies/indication/available labs.  Repeat Cefepime 2 g IV at 2000 this evening & q8h and repeat Flagyl 500 mg IV at 2200 & q12h while in ED.  If patient remains in ED through tomorrow morning, continue vancomycin 1250 mg IV every 24 hours (Goal AUC 400-550, eAC 473.1, SCr used 0.8) beginning 11/14 at 0800.  Further antibiotics/pharmacy consults should be ordered by admitting physician if indicated.                       Thank you, Suzzanne Cloud, PharmD, BCPS 12/26/2021  3:20 PM

## 2021-12-26 NOTE — ED Triage Notes (Signed)
Pt arrived via POV, x2 wks ago had GYN procedure, has had n/v, fevers, poor PO intake and ab pain.   Given 1mg fentanyl  '4mg'$  zofran  700 cc LR  650 mg tylenol

## 2021-12-26 NOTE — Progress Notes (Signed)
A consult was received from an ED physician for vancomycin per pharmacy dosing (for an indication other than meningitis). The patient's profile has been reviewed for ht/wt/allergies/indication/available labs. A one time order has been placed for the above antibiotics.  Further antibiotics/pharmacy consults should be ordered by admitting physician if indicated.                       Reuel Boom, PharmD, BCPS [phone] 12/26/2021, 11:54 AM

## 2021-12-27 ENCOUNTER — Encounter (HOSPITAL_COMMUNITY): Payer: Self-pay | Admitting: Obstetrics and Gynecology

## 2021-12-27 LAB — CBC WITH DIFFERENTIAL/PLATELET
Abs Immature Granulocytes: 0.77 10*3/uL — ABNORMAL HIGH (ref 0.00–0.07)
Basophils Absolute: 0.1 10*3/uL (ref 0.0–0.1)
Basophils Relative: 0 %
Eosinophils Absolute: 0 10*3/uL (ref 0.0–0.5)
Eosinophils Relative: 0 %
HCT: 27.5 % — ABNORMAL LOW (ref 36.0–46.0)
Hemoglobin: 8.9 g/dL — ABNORMAL LOW (ref 12.0–15.0)
Immature Granulocytes: 3 %
Lymphocytes Relative: 10 %
Lymphs Abs: 2.3 10*3/uL (ref 0.7–4.0)
MCH: 25.8 pg — ABNORMAL LOW (ref 26.0–34.0)
MCHC: 32.4 g/dL (ref 30.0–36.0)
MCV: 79.7 fL — ABNORMAL LOW (ref 80.0–100.0)
Monocytes Absolute: 1 10*3/uL (ref 0.1–1.0)
Monocytes Relative: 4 %
Neutro Abs: 20 10*3/uL — ABNORMAL HIGH (ref 1.7–7.7)
Neutrophils Relative %: 83 %
Platelets: 293 10*3/uL (ref 150–400)
RBC: 3.45 MIL/uL — ABNORMAL LOW (ref 3.87–5.11)
RDW: 15.1 % (ref 11.5–15.5)
WBC: 24.2 10*3/uL — ABNORMAL HIGH (ref 4.0–10.5)
nRBC: 0 % (ref 0.0–0.2)

## 2021-12-27 LAB — GC/CHLAMYDIA PROBE AMP (~~LOC~~) NOT AT ARMC
Chlamydia: NEGATIVE
Comment: NEGATIVE
Comment: NORMAL
Neisseria Gonorrhea: NEGATIVE

## 2021-12-27 LAB — URINE CULTURE: Culture: NO GROWTH

## 2021-12-27 MED ORDER — MORPHINE SULFATE (PF) 2 MG/ML IV SOLN
2.0000 mg | Freq: Once | INTRAVENOUS | Status: AC
  Administered 2021-12-27: 2 mg via INTRAVENOUS
  Filled 2021-12-27: qty 1

## 2021-12-27 MED ORDER — OXYCODONE-ACETAMINOPHEN 5-325 MG PO TABS
1.0000 | ORAL_TABLET | Freq: Four times a day (QID) | ORAL | Status: DC | PRN
  Administered 2021-12-27 – 2021-12-29 (×3): 2 via ORAL
  Filled 2021-12-27 (×3): qty 2

## 2021-12-28 LAB — CBC WITH DIFFERENTIAL/PLATELET
Abs Immature Granulocytes: 0.86 10*3/uL — ABNORMAL HIGH (ref 0.00–0.07)
Basophils Absolute: 0.1 10*3/uL (ref 0.0–0.1)
Basophils Relative: 0 %
Eosinophils Absolute: 0.1 10*3/uL (ref 0.0–0.5)
Eosinophils Relative: 0 %
HCT: 27.8 % — ABNORMAL LOW (ref 36.0–46.0)
Hemoglobin: 8.9 g/dL — ABNORMAL LOW (ref 12.0–15.0)
Immature Granulocytes: 4 %
Lymphocytes Relative: 8 %
Lymphs Abs: 1.9 10*3/uL (ref 0.7–4.0)
MCH: 25.7 pg — ABNORMAL LOW (ref 26.0–34.0)
MCHC: 32 g/dL (ref 30.0–36.0)
MCV: 80.3 fL (ref 80.0–100.0)
Monocytes Absolute: 1.1 10*3/uL — ABNORMAL HIGH (ref 0.1–1.0)
Monocytes Relative: 5 %
Neutro Abs: 18.8 10*3/uL — ABNORMAL HIGH (ref 1.7–7.7)
Neutrophils Relative %: 83 %
Platelets: 326 10*3/uL (ref 150–400)
RBC: 3.46 MIL/uL — ABNORMAL LOW (ref 3.87–5.11)
RDW: 15.7 % — ABNORMAL HIGH (ref 11.5–15.5)
WBC: 22.9 10*3/uL — ABNORMAL HIGH (ref 4.0–10.5)
nRBC: 0 % (ref 0.0–0.2)

## 2021-12-28 LAB — BASIC METABOLIC PANEL
Anion gap: 13 (ref 5–15)
BUN: 7 mg/dL (ref 6–20)
CO2: 15 mmol/L — ABNORMAL LOW (ref 22–32)
Calcium: 8.3 mg/dL — ABNORMAL LOW (ref 8.9–10.3)
Chloride: 110 mmol/L (ref 98–111)
Creatinine, Ser: 0.72 mg/dL (ref 0.44–1.00)
GFR, Estimated: >60 mL/min (ref >60)
Glucose, Bld: 87 mg/dL (ref 70–99)
Potassium: 3 mmol/L — ABNORMAL LOW (ref 3.5–5.1)
Sodium: 138 mmol/L (ref 135–145)

## 2021-12-28 MED ORDER — MORPHINE SULFATE (PF) 4 MG/ML IV SOLN
4.0000 mg | Freq: Once | INTRAVENOUS | Status: AC
  Administered 2021-12-28: 4 mg via INTRAVENOUS
  Filled 2021-12-28: qty 1

## 2021-12-28 NOTE — Progress Notes (Signed)
  Transition of Care [facility]) Screening Note   Patient Details  Name: Marie Hill Date of Birth: August 01, 1980   Transition of Care [facility]) CM/SW Contact:    Vassie Moselle, LCSW Phone Number: 12/28/2021, 2:04 PM    Transition of Care Department [facility]) has reviewed patient and no TOC needs have been identified at this time. We will continue to monitor patient advancement through interdisciplinary progression rounds. If new patient transition needs arise, please place a TOC consult.

## 2021-12-28 NOTE — Progress Notes (Signed)
Marie Hill is a 41 y.o. female patient.  She continues to feel better with very little abdominal pain.  No narcotic analgesics have been necessary.  Patient feels hungry.  1. Abdominal pain, unspecified abdominal location   2. Sepsis, due to unspecified organism, unspecified whether acute organ dysfunction present ([place])   3. 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   no meds 1997, treated in June 2016 x 1 month- states neg chest x ray June 2016    Current Facility-Administered Medications  Medication Dose Route Frequency Provider Last Rate Last Admin   acetaminophen (TYLENOL) tablet 650 mg  650 mg Oral Q6H PRN Georgina Snell C, MD   650 mg at 12/27/21 1020   ceFEPIme (MAXIPIME) 2 g in sodium chloride 0.9 % 100 mL IVPB  2 g Intravenous Q8H Suzzanne Cloud, RPH 200 mL/hr at 12/28/21 1355 2 g at 12/28/21 1355   enoxaparin (LOVENOX) injection 40 mg  40 mg Subcutaneous Q24H Governor Specking, MD   40 mg at 12/27/21 2200   metroNIDAZOLE (FLAGYL) IVPB 500 mg  500 mg Intravenous Q12H Suzzanne Cloud, RPH 100 mL/hr at 12/28/21 0952 500 mg at 12/28/21 0952   oxyCODONE-acetaminophen (PERCOCET/ROXICET) 5-325 MG per tablet 1-2 tablet  1-2 tablet Oral Q6H PRN Governor Specking, MD   2 tablet at 12/28/21 1240   vancomycin (VANCOREADY) IVPB 1250 mg/250 mL  1,250 mg Intravenous Q24H Elgie Congo, MD   Stopped at 12/28/21 [phone]   No Known Allergies Principal Problem:   Sepsis ([place])  Blood pressure 124/75, pulse 93, temperature 98.2 F (36.8 C), temperature source Oral, resp. rate 18, height '5\' 2"'$  (1.575 m), weight 52.2 kg, SpO2 100 %.  Physical Exam: Patient in no acute distress Lungs: Clear Heart: Regular rate and  rhythm Abdomen: Soft, slight tenderness in the left lower and left upper quadrant.  Bowel sounds present Pelvic: Deferred  Plan: Patient will be switched to regular diet with continued antibiotics Blood cultures: So far negative We will get ID consultation for evaluation of her progress and for advice on choice of antibiotics to treat the patient as outpatient Patient was updated.  She is contemplating on having Dr. Denman George perform bilateral oophorectomy only versus bilateral oophorectomy with hysterectomy.  The former would be desirable only if she remained interested in carrying a pregnancy with donor oocyte in the near future.  [name] 12/28/2021

## 2021-12-29 DIAGNOSIS — R5081 Fever presenting with conditions classified elsewhere: Secondary | ICD-10-CM | POA: Diagnosis not present

## 2021-12-29 DIAGNOSIS — R102 Pelvic and perineal pain: Secondary | ICD-10-CM

## 2021-12-29 LAB — CBC WITH DIFFERENTIAL/PLATELET
Abs Immature Granulocytes: 0.86 10*3/uL — ABNORMAL HIGH (ref 0.00–0.07)
Basophils Absolute: 0.1 10*3/uL (ref 0.0–0.1)
Basophils Relative: 1 %
Eosinophils Absolute: 0.1 10*3/uL (ref 0.0–0.5)
Eosinophils Relative: 0 %
HCT: 30.6 % — ABNORMAL LOW (ref 36.0–46.0)
Hemoglobin: 9.8 g/dL — ABNORMAL LOW (ref 12.0–15.0)
Immature Granulocytes: 4 %
Lymphocytes Relative: 9 %
Lymphs Abs: 1.9 10*3/uL (ref 0.7–4.0)
MCH: 25.6 pg — ABNORMAL LOW (ref 26.0–34.0)
MCHC: 32 g/dL (ref 30.0–36.0)
MCV: 79.9 fL — ABNORMAL LOW (ref 80.0–100.0)
Monocytes Absolute: 1.2 10*3/uL — ABNORMAL HIGH (ref 0.1–1.0)
Monocytes Relative: 6 %
Neutro Abs: 16.8 10*3/uL — ABNORMAL HIGH (ref 1.7–7.7)
Neutrophils Relative %: 80 %
Platelets: 388 10*3/uL (ref 150–400)
RBC: 3.83 MIL/uL — ABNORMAL LOW (ref 3.87–5.11)
RDW: 15.8 % — ABNORMAL HIGH (ref 11.5–15.5)
WBC: 21 10*3/uL — ABNORMAL HIGH (ref 4.0–10.5)
nRBC: 0 % (ref 0.0–0.2)

## 2021-12-29 NOTE — Progress Notes (Signed)
Mobility Specialist - Progress Note   12/29/21 1353  Mobility  Activity Ambulated with assistance in hallway  Level of Assistance Independent after set-up  Assistive Device None  Distance Ambulated (ft) 500 ft  Activity Response Tolerated well  Mobility Referral Yes  $Mobility charge 1 Mobility   Pt received in bed and agreed to mobility, no c/o pain nor discomfort. Pt returned to bed with all needs met.   Roderick Pee Mobility Specialist

## 2021-12-29 NOTE — Progress Notes (Signed)
Mobility Specialist - Progress Note   12/29/21 0925  Mobility  Activity Ambulated with assistance in hallway  Level of Assistance Standby assist, set-up cues, supervision of patient - no hands on  Assistive Device Other (Comment) (hand held assist)  Distance Ambulated (ft) 500 ft  Activity Response Tolerated well  Mobility Referral Yes  $Mobility charge 1 Mobility   Pt received in bed and requested mobility, some pain in abdomen and bottom area. Pt returned to bed with all needs met and family in room.   Roderick Pee Mobility Specialist

## 2021-12-29 NOTE — Consult Note (Signed)
[place] for Infectious Disease       Reason for Consult: infected endometrioma Referring Physician: Dr. Kerin Perna - consult request noted per note 11/15 so will proceed with consultation  Principal Problem:   Sepsis ([place])    enoxaparin (LOVENOX) injection  40 mg Subcutaneous Q24H    Recommendations: Doxycycline 100 mg twice a day + Augmentin 875 mg twice a day for 10 days at discharge  Assessment: She came in with worsening fever and worsening pelvic pain due to probable infection of the endometriomas.  Blood cultures have remained negative and she is improving on empiric treatment with cefepime, vancomycin and metronidazole.  Initial fever and leukocytosis improved.  At this point, unclear organism so will need to consider broad coverage with above.  No set duration for this but I anticipate 10 days at discharge would be sufficient.     HPI: Cheyanna Strick is a 41 y.o. female with a history of diabetes and endometriosis and is s/p left salpino-oophorectomy with recurrence of endometrioma in the right ovary followed by recent left adnexal and right ovarian drainage of endometriomas by Dr. Kerin Perna.  She has come in now with abdominal pain and fever to 102.8 with leukocytosis of 24.2.  She was started on broad spectrum antibiotics and has been on treatment now day 4.  Blood cultures remain negative.   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   no meds 1997, treated in June 2016 x 1 month- states neg chest x ray June 2016    Social History   Tobacco Use   Smoking status: Never   Smokeless tobacco:  Never  Vaping Use   Vaping Use: Never used  Substance Use Topics   Alcohol use: Not Currently    Alcohol/week: 0.0 standard drinks of alcohol   Drug use: No    Family History  Problem Relation Age of Onset   Hyperlipidemia Mother    Hypertension Mother    Hyperlipidemia Father     No Known Allergies  Physical Exam: Constitutional: in no apparent distress  Vitals:   12/29/21 0017 12/29/21 1409  BP: 132/83 125/87  Pulse: 94 87  Resp: 20 16  Temp: 98.5 F (36.9 C) 97.8 F (36.6 C)  SpO2: 97% 100%   EYES: anicteric Respiratory: normal respiratory effort Musculoskeletal: no edema  Lab Results  Component Value Date   WBC 21.0 (H) 12/29/2021   HGB 9.8 (L) 12/29/2021   HCT 30.6 (L) 12/29/2021   MCV 79.9 (L) 12/29/2021   PLT 388 12/29/2021    Lab Results  Component Value Date   CREATININE 0.72 12/28/2021   BUN 7 12/28/2021   NA 138 12/28/2021   K 3.0 (L) 12/28/2021   CL 110 12/28/2021   CO2 15 (L) 12/28/2021    Lab Results  Component Value Date   ALT 59 (H) 12/26/2021   AST 41 12/26/2021   ALKPHOS 347 (H) 12/26/2021     Microbiology: Recent Results (from the past 240 hour(s))  Resp Panel by RT-PCR (Flu A&B, Covid) Anterior Nasal Swab  Status: None   Collection Time: 12/26/21 10:57 AM   Specimen: Anterior Nasal Swab  Result Value Ref Range Status   SARS Coronavirus 2 by RT PCR NEGATIVE NEGATIVE Final    Comment: (NOTE) SARS-CoV-2 target nucleic acids are NOT DETECTED.  The SARS-CoV-2 RNA is generally detectable in upper respiratory specimens during the acute phase of infection. The lowest concentration of SARS-CoV-2 viral copies this assay can detect is 138 copies/mL. A negative result does not preclude SARS-Cov-2 infection and should not be used as the sole basis for treatment or other patient management decisions. A negative result may occur with  improper specimen collection/handling, submission of specimen other than nasopharyngeal swab,  presence of viral mutation(s) within the areas targeted by this assay, and inadequate number of viral copies(<138 copies/mL). A negative result must be combined with clinical observations, patient history, and epidemiological information. The expected result is Negative.  Fact Sheet for Patients:  EntrepreneurPulse.com.au  Fact Sheet for Healthcare Providers:  IncredibleEmployment.be  This test is no t yet approved or cleared by the [place] FDA and  has been authorized for detection and/or diagnosis of SARS-CoV-2 by FDA under an Emergency Use Authorization (EUA). This EUA will remain  in effect (meaning this test can be used) for the duration of the COVID-19 declaration under Section 564(b)(1) of the Act, 21 U.S.C.section 360bbb-3(b)(1), unless the authorization is terminated  or revoked sooner.       Influenza A by PCR NEGATIVE NEGATIVE Final   Influenza B by PCR NEGATIVE NEGATIVE Final    Comment: (NOTE) The Xpert Xpress SARS-CoV-2/FLU/RSV plus assay is intended as an aid in the diagnosis of influenza from Nasopharyngeal swab specimens and should not be used as a sole basis for treatment. Nasal washings and aspirates are unacceptable for Xpert Xpress SARS-CoV-2/FLU/RSV testing.  Fact Sheet for Patients: EntrepreneurPulse.com.au  Fact Sheet for Healthcare Providers: IncredibleEmployment.be  This test is not yet approved or cleared by the [place] FDA and has been authorized for detection and/or diagnosis of SARS-CoV-2 by FDA under an Emergency Use Authorization (EUA). This EUA will remain in effect (meaning this test can be used) for the duration of the COVID-19 declaration under Section 564(b)(1) of the Act, 21 U.S.C. section 360bbb-3(b)(1), unless the authorization is terminated or revoked.  Performed at [facility], [place] [address] [place], [place] [address]   Blood  Culture (routine x 2)     Status: None (Preliminary result)   Collection Time: 12/26/21 11:30 AM   Specimen: BLOOD  Result Value Ref Range Status   Specimen Description   Final    BLOOD BLOOD RIGHT ARM Performed at [place] [address], [place], [place] [address]    Special Requests   Final    BOTTLES DRAWN AEROBIC AND ANAEROBIC Blood Culture results may not be optimal due to an inadequate volume of blood received in culture bottles Performed at [place] [address], [place], [place] [address]    Culture   Final    NO GROWTH 3 DAYS Performed at [facility], [place] [address], [place], [place] [address]    Report Status PENDING  Incomplete  Blood Culture (routine x 2)     Status: None (Preliminary result)   Collection Time: 12/26/21 11:30 AM   Specimen: BLOOD  Result Value Ref Range Status   Specimen Description   Final    BLOOD BLOOD RIGHT HAND Performed at [place]  [address], [place], [place] [address]    Special Requests   Final    BOTTLES DRAWN AEROBIC AND ANAEROBIC Blood Culture results may not be optimal due to an inadequate volume of blood received in culture bottles Performed at [place] [address] [place], [place] [address]    Culture   Final    NO GROWTH 3 DAYS Performed at [facility], [place] [address], [place], [place] [address]    Report Status PENDING  Incomplete  Wet prep, genital     Status: Abnormal   Collection Time: 12/26/21  2:58 PM   Specimen: PATH Cytology Ancillary Only  Result Value Ref Range Status   Yeast Wet Prep HPF POC NONE SEEN NONE SEEN Final    Comment: Specimen diluted due to transport tube containing more than 1 ml of saline, interpret results with caution.   Trich, Wet Prep NONE SEEN NONE SEEN Final   Clue Cells Wet Prep HPF POC PRESENT (A) NONE SEEN Final   WBC, Wet Prep HPF POC <10 <10 Final   Sperm NONE SEEN  Final     Comment: Performed at [facility], [place] [address], [place], [place] [address]  Urine Culture     Status: None   Collection Time: 12/26/21  5:25 PM   Specimen: Urine, Clean Catch  Result Value Ref Range Status   Specimen Description   Final    URINE, CLEAN CATCH Performed at [facility], [place] [address], [place], [place] [address]    Special Requests   Final    NONE Performed at [facility], [place] [address] [place], [place] [address]    Culture   Final    NO GROWTH Performed at [facility], [place] [address], [place], [place] [address]    Report Status 12/27/2021 FINAL  Final    Thayer Headings, [place] for Infectious Disease [facility] [email or web address] 12/29/2021, 4:29 PM

## 2021-12-30 DIAGNOSIS — E43 Unspecified severe protein-calorie malnutrition: Secondary | ICD-10-CM | POA: Insufficient documentation

## 2021-12-30 LAB — CBC WITH DIFFERENTIAL/PLATELET
Abs Immature Granulocytes: 1.05 10*3/uL — ABNORMAL HIGH (ref 0.00–0.07)
Basophils Absolute: 0.1 10*3/uL (ref 0.0–0.1)
Basophils Relative: 1 %
Eosinophils Absolute: 0.1 10*3/uL (ref 0.0–0.5)
Eosinophils Relative: 0 %
HCT: 29.5 % — ABNORMAL LOW (ref 36.0–46.0)
Hemoglobin: 9.7 g/dL — ABNORMAL LOW (ref 12.0–15.0)
Immature Granulocytes: 5 %
Lymphocytes Relative: 10 %
Lymphs Abs: 2.2 10*3/uL (ref 0.7–4.0)
MCH: 25.4 pg — ABNORMAL LOW (ref 26.0–34.0)
MCHC: 32.9 g/dL (ref 30.0–36.0)
MCV: 77.2 fL — ABNORMAL LOW (ref 80.0–100.0)
Monocytes Absolute: 1.2 10*3/uL — ABNORMAL HIGH (ref 0.1–1.0)
Monocytes Relative: 6 %
Neutro Abs: 17 10*3/uL — ABNORMAL HIGH (ref 1.7–7.7)
Neutrophils Relative %: 78 %
Platelets: 420 10*3/uL — ABNORMAL HIGH (ref 150–400)
RBC: 3.82 MIL/uL — ABNORMAL LOW (ref 3.87–5.11)
RDW: 15.1 % (ref 11.5–15.5)
WBC: 21.6 10*3/uL — ABNORMAL HIGH (ref 4.0–10.5)
nRBC: 0 % (ref 0.0–0.2)

## 2021-12-30 MED ORDER — DOXYCYCLINE HYCLATE 50 MG PO CAPS: 100.0000 mg | ORAL_CAPSULE | Freq: Two times a day (BID) | ORAL | 1 refills | Status: DC

## 2021-12-30 MED ORDER — AMOXICILLIN-POT CLAVULANATE 875-125 MG PO TABS: 1.0000 | ORAL_TABLET | Freq: Two times a day (BID) | ORAL | 1 refills | Status: DC

## 2021-12-30 MED ORDER — BOOST / RESOURCE BREEZE PO LIQD CUSTOM
1.0000 | Freq: Three times a day (TID) | ORAL | Status: DC
  Administered 2021-12-30 – 2021-12-31 (×3): 1 via ORAL

## 2021-12-30 NOTE — Progress Notes (Signed)
Mobility Specialist - Progress Note   12/30/21 0856  Mobility  Activity Ambulated with assistance in hallway  Level of Assistance Independent after set-up  Assistive Device None  Distance Ambulated (ft) 500 ft  Activity Response Tolerated well  Mobility Referral Yes  $Mobility charge 1 Mobility   Pt received in bed and agreed to mobility, pain in R side of body. Did not specify where but overall pain throughout the R side. Claimed that she felt like she was walking as if she was in labor. Pt returned to bed with all needs met.   Roderick Pee Mobility Specialist

## 2021-12-30 NOTE — Discharge Summary (Addendum)
Physician Discharge Summary  Patient ID: Marie Hill MRN: [id number] DOB/AGE: 08/06/1980 41 y.o.  Admit date: 12/26/2021 Discharge date: 12/30/2021  Admission Diagnoses: Sepsis, infected endometrioma of the ovary  Discharge Diagnoses:  Principal Problem:   Sepsis ([place]) Active Problems:   Protein-calorie malnutrition, severe   Discharged Condition: Good  Hospital Course: Patient was admitted for close observation and IV antibiotics.  She was kept n.p.o. in case surgical intervention and drainage became necessary but her diet was gradually advanced since the pain and vital signs as well as white blood cell count responded to IV antibiotics.  Blood cultures remained negative after 3 days  Consults:  Infectious disease  Significant Diagnostic Studies: radiology: CT scan showed:There is a complex, multi-septated cystic right adnexal mass which measures 9.2 x 6.2 x 7.0 cm. This mass is new compared with the previous CT and demonstrates thickened septations. No left adnexal mass or suspicious uterine findings. Small cervical nabothian cysts are noted. Other: Mild pelvic soft tissue  Bacteriology: Negative blood cultures after 3 days  Treatments: antibiotics: IV cefepime, metronidazole, and vancomycin  Discharge Exam: Blood pressure (!) 122/97, pulse 91, temperature 98.5 F (36.9 C), temperature source Oral, resp. rate 20, height '5\' 2"'$  (1.575 m), weight 52.2 kg, SpO2 100 %. General appearance: alert, cooperative, and no distress Eyes: conjunctivae/corneas clear. PERRL, EOM's intact. Fundi benign. Resp: clear to auscultation bilaterally GI: soft, non-tender; bowel sounds normal; no masses,  no organomegaly Skin: Skin color, texture, turgor normal. No rashes or lesions  Disposition: Will send home on Augmentin 875 twice a day and doxycycline 100 mg twice a day for 10 days, as per recommendations by ID consult.  We will see her in the office in 2 weeks.  I also strongly advised patient  to contact Dr. Denman George or her coworkers at [facility] oncology for the complex surgery of either performing a TAH and BSO or simply bilateral salpingo-oophorectomy, according to the patient's final decision (she is not made up her mind whether she would like to preserve her childbearing potential yet)      Signed: Governor Specking 12/30/2021, 7:33 PM

## 2021-12-30 NOTE — Progress Notes (Signed)
Initial Nutrition Assessment  DOCUMENTATION CODES:   Severe malnutrition in context of acute illness/injury  INTERVENTION:   Boost Breeze po TID, each supplement provides 250 kcal and 9 grams of protein  Encourage PO intake at meals, reviewed menu options with patient    NUTRITION DIAGNOSIS:   Severe Malnutrition related to acute illness as evidenced by moderate muscle depletion, percent weight loss.  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   Spoke with pt who is able to provide hx. She reports she has lost a lot of weight (10%) over the last ~ 3 weeks due to poor intake related to pain, taste alterations.  Nothing tastes good, even food from home. Fatty foods cause nausea. She has ensure at home but has been unable to drink them. She is willing to drink Boost Breeze after trying supplement.  Her usual weight is 127 lb and she is currently down to 115 lb.  Symptoms started about 15 days PTA.   Medications reviewed Labs reviewed    NUTRITION - FOCUSED PHYSICAL EXAM:  Flowsheet Row Most Recent Value  Orbital Region No depletion  Upper Arm Region Mild depletion  Thoracic and Lumbar Region No depletion  Buccal Region No depletion  Temple Region No depletion  Clavicle Bone Region No depletion  Clavicle and Acromion Bone Region No depletion  Scapular Bone Region No depletion  Dorsal Hand Mild depletion  Patellar Region Moderate depletion  Anterior Thigh Region Moderate depletion  Posterior Calf Region Moderate depletion  Edema (RD Assessment) None  Hair Reviewed  Eyes Reviewed  Mouth  Reviewed  Skin Reviewed  Nails Reviewed       Diet Order:   Diet Order             Diet regular Room service appropriate? Yes; Fluid consistency: Thin  Diet effective now                   EDUCATION NEEDS:   Education needs have been addressed  Skin:  Skin Assessment: Reviewed RN Assessment  Last BM:  11/12  Height:   Ht Readings from Last 1 Encounters:  12/27/21 '5\' 2"'$  (1.575 m)    Weight:   Wt Readings from Last 1 Encounters:  12/27/21 52.2 kg    BMI:  Body mass index is 21.03 kg/m.  Estimated Nutritional Needs:   Kcal:  1400-600  Protein:  75-85 grams  Fluid:  > 1.5 L/day  Lockie Pares., RD, LDN, CNSC See AMiON for contact information

## 2021-12-31 LAB — CBC WITH DIFFERENTIAL/PLATELET
Abs Immature Granulocytes: 1.16 10*3/uL — ABNORMAL HIGH (ref 0.00–0.07)
Basophils Absolute: 0.1 10*3/uL (ref 0.0–0.1)
Basophils Relative: 0 %
Eosinophils Absolute: 0 10*3/uL (ref 0.0–0.5)
Eosinophils Relative: 0 %
HCT: 27.3 % — ABNORMAL LOW (ref 36.0–46.0)
Hemoglobin: 9 g/dL — ABNORMAL LOW (ref 12.0–15.0)
Immature Granulocytes: 5 %
Lymphocytes Relative: 9 %
Lymphs Abs: 2.1 10*3/uL (ref 0.7–4.0)
MCH: 25.6 pg — ABNORMAL LOW (ref 26.0–34.0)
MCHC: 33 g/dL (ref 30.0–36.0)
MCV: 77.6 fL — ABNORMAL LOW (ref 80.0–100.0)
Monocytes Absolute: 1.3 10*3/uL — ABNORMAL HIGH (ref 0.1–1.0)
Monocytes Relative: 6 %
Neutro Abs: 18.7 10*3/uL — ABNORMAL HIGH (ref 1.7–7.7)
Neutrophils Relative %: 80 %
Platelets: 442 10*3/uL — ABNORMAL HIGH (ref 150–400)
RBC: 3.52 MIL/uL — ABNORMAL LOW (ref 3.87–5.11)
RDW: 15.1 % (ref 11.5–15.5)
WBC: 23.3 10*3/uL — ABNORMAL HIGH (ref 4.0–10.5)
nRBC: 0.1 % (ref 0.0–0.2)

## 2021-12-31 LAB — CULTURE, BLOOD (ROUTINE X 2)
Culture: NO GROWTH
Culture: NO GROWTH

## 2021-12-31 NOTE — Progress Notes (Signed)
Spoke to Dr. Kerin Perna regarding patient's discharge. Dr. Kerin Perna stated that patient is okay'd to be discharged today.

## 2021-12-31 NOTE — Progress Notes (Signed)
RN was informed during report that pt was supposed to be DC yesterday and planned to be DC today. RN asked the pt about that and said she had fever yesterday resulting for her not to be DC. Charge nurse was informed and called Dr. Kerin Perna and told that pt was supposed to be DC yesterday and nobody informed him that pt was not DC yesterday. Will DC pt now with follow-up in 2 weeks and DC with oral abx. Pt and partner was agreeable about the plan.

## 2022-01-02 ENCOUNTER — Telehealth: Payer: Self-pay

## 2022-01-02 NOTE — Telephone Encounter (Signed)
Transition Care Management Follow-up Telephone Call Date of discharge and from where: [facility] 12[facility] How have you been since you were released from the hospital? Still having pain Any questions or concerns? No  Items Reviewed: Did the pt receive and understand the discharge instructions provided? Yes  Medications obtained and verified? Yes  Other? No  Any new allergies since your discharge? Yes  Dietary orders reviewed? Yes Do you have support at home? Yes   Home Care and Equipment/Supplies: Were home health services ordered? no If so, what is the name of the agency? N/a  Has the agency set up a time to come to the patient's home? not applicable Were any new equipment or medical supplies ordered?  No What is the name of the medical supply agency? N/a Were you able to get the supplies/equipment? not applicable Do you have any questions related to the use of the equipment or supplies? No  Functional Questionnaire: (I = Independent and D = Dependent) ADLs: I  Bathing/Dressing- I  Meal Prep- I  Eating- I  Maintaining continence- I  Transferring/Ambulation- I  Managing Meds- I  Follow up appointments reviewed:  PCP Hospital f/u appt confirmed? Yes  Scheduled to see dR gREENE on 01/10/2022 @ 12:00. [facility] f/u appt confirmed? No  Scheduled to see Dr Kerin Perna . Waiting for return call for appt time Are transportation arrangements needed? No  If their condition worsens, is the pt aware to call PCP or go to the Emergency Dept.? Yes Was the patient provided with contact information for the PCP's office or ED? Yes Was to pt encouraged to call back with questions or concerns? Yes  Marie Crumble, LPN [facility]ial [phone]

## 2022-01-12 ENCOUNTER — Telehealth: Payer: Self-pay

## 2022-01-12 ENCOUNTER — Encounter: Payer: Self-pay | Admitting: Gynecologic Oncology

## 2022-01-12 NOTE — Telephone Encounter (Signed)
Spoke with [facility] regarding her referral to GYN oncology. She has an appointment scheduled with Dr. Berline Lopes on 01/13/22 at 9:00am. Patient agrees to date and time. She has been provided with office address and location. She is also aware of our mask and visitor policy. Patient verbalized understanding and will call with any questions.

## 2022-01-13 ENCOUNTER — Other Ambulatory Visit: Payer: Self-pay

## 2022-01-13 ENCOUNTER — Ambulatory Visit (HOSPITAL_COMMUNITY): Payer: Commercial Managed Care - HMO

## 2022-01-13 ENCOUNTER — Emergency Department (HOSPITAL_COMMUNITY): Payer: Commercial Managed Care - HMO

## 2022-01-13 ENCOUNTER — Inpatient Hospital Stay: Payer: Commercial Managed Care - HMO | Attending: Gynecologic Oncology | Admitting: Gynecologic Oncology

## 2022-01-13 ENCOUNTER — Inpatient Hospital Stay (HOSPITAL_COMMUNITY)
Admission: EM | Admit: 2022-01-13 | Discharge: 2022-01-20 | DRG: 872 | Disposition: A | Discharge status: Home Health | Payer: Commercial Managed Care - HMO | Attending: Obstetrics and Gynecology | Admitting: Obstetrics and Gynecology

## 2022-01-13 ENCOUNTER — Encounter: Payer: Self-pay | Admitting: Gynecologic Oncology

## 2022-01-13 ENCOUNTER — Encounter (HOSPITAL_COMMUNITY): Payer: Self-pay

## 2022-01-13 ENCOUNTER — Inpatient Hospital Stay: Payer: Commercial Managed Care - HMO | Admitting: Gynecologic Oncology

## 2022-01-13 VITALS — BP 121/81 | HR 108 | Temp 98.6°F | Resp 18 | Ht 62.0 in | Wt 115.2 lb

## 2022-01-13 DIAGNOSIS — N7093 Salpingitis and oophoritis, unspecified: Secondary | ICD-10-CM | POA: Diagnosis present

## 2022-01-13 DIAGNOSIS — Z8249 Family history of ischemic heart disease and other diseases of the circulatory system: Secondary | ICD-10-CM

## 2022-01-13 DIAGNOSIS — N809 Endometriosis, unspecified: Secondary | ICD-10-CM

## 2022-01-13 DIAGNOSIS — R633 Feeding difficulties, unspecified: Secondary | ICD-10-CM | POA: Insufficient documentation

## 2022-01-13 DIAGNOSIS — R000 Tachycardia, unspecified: Secondary | ICD-10-CM

## 2022-01-13 DIAGNOSIS — E1165 Type 2 diabetes mellitus with hyperglycemia: Secondary | ICD-10-CM | POA: Diagnosis present

## 2022-01-13 DIAGNOSIS — R102 Pelvic and perineal pain: Secondary | ICD-10-CM | POA: Insufficient documentation

## 2022-01-13 DIAGNOSIS — Z7984 Long term (current) use of oral hypoglycemic drugs: Secondary | ICD-10-CM | POA: Diagnosis not present

## 2022-01-13 DIAGNOSIS — R197 Diarrhea, unspecified: Secondary | ICD-10-CM | POA: Insufficient documentation

## 2022-01-13 DIAGNOSIS — N9489 Other specified conditions associated with female genital organs and menstrual cycle: Secondary | ICD-10-CM

## 2022-01-13 DIAGNOSIS — Z79899 Other long term (current) drug therapy: Secondary | ICD-10-CM

## 2022-01-13 DIAGNOSIS — Z8349 Family history of other endocrine, nutritional and metabolic diseases: Secondary | ICD-10-CM | POA: Diagnosis not present

## 2022-01-13 DIAGNOSIS — D75839 Thrombocytosis, unspecified: Secondary | ICD-10-CM | POA: Diagnosis present

## 2022-01-13 DIAGNOSIS — E785 Hyperlipidemia, unspecified: Secondary | ICD-10-CM | POA: Diagnosis present

## 2022-01-13 DIAGNOSIS — R509 Fever, unspecified: Secondary | ICD-10-CM | POA: Insufficient documentation

## 2022-01-13 DIAGNOSIS — N7092 Oophoritis, unspecified: Secondary | ICD-10-CM | POA: Diagnosis not present

## 2022-01-13 DIAGNOSIS — N80121 Deep endometriosis of right ovary: Secondary | ICD-10-CM | POA: Diagnosis present

## 2022-01-13 DIAGNOSIS — K5900 Constipation, unspecified: Secondary | ICD-10-CM

## 2022-01-13 DIAGNOSIS — A419 Sepsis, unspecified organism: Secondary | ICD-10-CM | POA: Diagnosis present

## 2022-01-13 DIAGNOSIS — N83209 Unspecified ovarian cyst, unspecified side: Secondary | ICD-10-CM | POA: Diagnosis present

## 2022-01-13 DIAGNOSIS — E119 Type 2 diabetes mellitus without complications: Secondary | ICD-10-CM | POA: Insufficient documentation

## 2022-01-13 DIAGNOSIS — Z90721 Acquired absence of ovaries, unilateral: Secondary | ICD-10-CM | POA: Insufficient documentation

## 2022-01-13 DIAGNOSIS — R1031 Right lower quadrant pain: Secondary | ICD-10-CM | POA: Diagnosis present

## 2022-01-13 DIAGNOSIS — R112 Nausea with vomiting, unspecified: Secondary | ICD-10-CM | POA: Insufficient documentation

## 2022-01-13 DIAGNOSIS — N838 Other noninflammatory disorders of ovary, fallopian tube and broad ligament: Secondary | ICD-10-CM

## 2022-01-13 DIAGNOSIS — R971 Elevated cancer antigen 125 [CA 125]: Secondary | ICD-10-CM | POA: Insufficient documentation

## 2022-01-13 HISTORY — DX: Malignant (primary) neoplasm, unspecified: C80.1

## 2022-01-13 LAB — CMP (CANCER CENTER ONLY)
ALT: 97 U/L — ABNORMAL HIGH (ref 0–44)
AST: 70 U/L — ABNORMAL HIGH (ref 15–41)
Albumin: 3.8 g/dL (ref 3.5–5.0)
Alkaline Phosphatase: 810 U/L — ABNORMAL HIGH (ref 38–126)
Anion gap: 10 (ref 5–15)
BUN: 10 mg/dL (ref 6–20)
CO2: 27 mmol/L (ref 22–32)
Calcium: 11.2 mg/dL — ABNORMAL HIGH (ref 8.9–10.3)
Chloride: 94 mmol/L — ABNORMAL LOW (ref 98–111)
Creatinine: 0.62 mg/dL (ref 0.44–1.00)
GFR, Estimated: >60 mL/min (ref >60)
Glucose, Bld: 277 mg/dL — ABNORMAL HIGH (ref 70–99)
Potassium: 4.1 mmol/L (ref 3.5–5.1)
Sodium: 131 mmol/L — ABNORMAL LOW (ref 135–145)
Total Bilirubin: 0.7 mg/dL (ref 0.3–1.2)
Total Protein: 10 g/dL — ABNORMAL HIGH (ref 6.5–8.1)

## 2022-01-13 LAB — CBC WITH DIFFERENTIAL (CANCER CENTER ONLY)
Abs Immature Granulocytes: 0.7 10*3/uL — ABNORMAL HIGH (ref 0.00–0.07)
Basophils Absolute: 0.1 10*3/uL (ref 0.0–0.1)
Basophils Relative: 0 %
Eosinophils Absolute: 0.1 10*3/uL (ref 0.0–0.5)
Eosinophils Relative: 0 %
HCT: 31.5 % — ABNORMAL LOW (ref 36.0–46.0)
Hemoglobin: 10.1 g/dL — ABNORMAL LOW (ref 12.0–15.0)
Immature Granulocytes: 4 %
Lymphocytes Relative: 13 %
Lymphs Abs: 2.4 10*3/uL (ref 0.7–4.0)
MCH: 24.7 pg — ABNORMAL LOW (ref 26.0–34.0)
MCHC: 32.1 g/dL (ref 30.0–36.0)
MCV: 77 fL — ABNORMAL LOW (ref 80.0–100.0)
Monocytes Absolute: 0.9 10*3/uL (ref 0.1–1.0)
Monocytes Relative: 5 %
Neutro Abs: 13.9 10*3/uL — ABNORMAL HIGH (ref 1.7–7.7)
Neutrophils Relative %: 78 %
Platelet Count: 671 10*3/uL — ABNORMAL HIGH (ref 150–400)
RBC: 4.09 MIL/uL (ref 3.87–5.11)
RDW: 14.8 % (ref 11.5–15.5)
WBC Count: 18 10*3/uL — ABNORMAL HIGH (ref 4.0–10.5)
nRBC: 0 % (ref 0.0–0.2)

## 2022-01-13 LAB — URINALYSIS, COMPLETE (UACMP) WITH MICROSCOPIC
Bilirubin Urine: NEGATIVE
Glucose, UA: >=500 mg/dL — AB
Hgb urine dipstick: NEGATIVE
Ketones, ur: NEGATIVE mg/dL
Leukocytes,Ua: NEGATIVE
Nitrite: NEGATIVE
Protein, ur: NEGATIVE mg/dL
Specific Gravity, Urine: 1.011 (ref 1.005–1.030)
pH: 8 (ref 5.0–8.0)

## 2022-01-13 LAB — WET PREP, GENITAL
Clue Cells Wet Prep HPF POC: NONE SEEN
Sperm: NONE SEEN
Trich, Wet Prep: NONE SEEN
WBC, Wet Prep HPF POC: >=10 — AB (ref <10)
Yeast Wet Prep HPF POC: NONE SEEN

## 2022-01-13 LAB — I-STAT BETA HCG BLOOD, ED (MC, WL, AP ONLY): I-stat hCG, quantitative: <5 m[IU]/mL (ref <5)

## 2022-01-13 MED ORDER — PIPERACILLIN-TAZOBACTAM 3.375 G IVPB 30 MIN
3.3750 g | Freq: Three times a day (TID) | INTRAVENOUS | Status: DC
  Filled 2022-01-13: qty 50

## 2022-01-13 MED ORDER — IOHEXOL 300 MG/ML  SOLN
100.0000 mL | Freq: Once | INTRAMUSCULAR | Status: AC | PRN
  Administered 2022-01-13: 100 mL via INTRAVENOUS

## 2022-01-13 MED ORDER — SODIUM CHLORIDE (PF) 0.9 % IJ SOLN
INTRAMUSCULAR | Status: AC
  Filled 2022-01-13: qty 50

## 2022-01-13 MED ORDER — PIPERACILLIN-TAZOBACTAM 3.375 G IVPB 30 MIN
3.3750 g | Freq: Once | INTRAVENOUS | Status: AC
  Administered 2022-01-13: 3.375 g via INTRAVENOUS
  Filled 2022-01-13: qty 50

## 2022-01-13 MED ORDER — SODIUM CHLORIDE 0.9 % IV BOLUS
1000.0000 mL | Freq: Once | INTRAVENOUS | Status: AC
  Administered 2022-01-13: 1000 mL via INTRAVENOUS

## 2022-01-13 MED ORDER — KETOROLAC TROMETHAMINE 30 MG/ML IJ SOLN
30.0000 mg | Freq: Once | INTRAMUSCULAR | Status: AC
  Administered 2022-01-13: 30 mg via INTRAVENOUS
  Filled 2022-01-13: qty 1

## 2022-01-13 MED ORDER — PIPERACILLIN-TAZOBACTAM 3.375 G IVPB
3.3750 g | Freq: Three times a day (TID) | INTRAVENOUS | Status: DC
  Administered 2022-01-13 – 2022-01-17 (×11): 3.375 g via INTRAVENOUS
  Filled 2022-01-13 (×11): qty 50

## 2022-01-13 MED ORDER — PIPERACILLIN-TAZOBACTAM 3.375 G IVPB 30 MIN
3.3750 g | Freq: Three times a day (TID) | INTRAVENOUS | Status: DC
  Filled 2022-01-13 (×2): qty 50

## 2022-01-13 MED ORDER — SODIUM CHLORIDE 0.9 % IV SOLN: INTRAVENOUS | Status: AC

## 2022-01-13 NOTE — ED Provider Triage Note (Signed)
Emergency Medicine Provider Triage Evaluation Note  [facility] , a 41 y.o. female  was evaluated in triage.  Pt complains of continuing abdominal pain and abnormal labs.  Significant hx of endometriosis and multiple rounds of failed IVF.  Has been in and out of the hospital due to ovarian mass and possible PID/TOA.  Has failed outpatient antibiotic therapy.  Saw OB/GYN today, was recommended to come to the ED for CT abdomen pelvis and IV antibiotics, with possible pelvic ultrasound.  Patient reports fevers over the last few days, "hot urine" and some mild diarrhea.  Is trying to stay hydrated, though with decreased appetite.  Has been drinking ensures.  WBC noted today at 18.0, concern for sepsis.  Review of Systems  Positive:  Negative: See above  Physical Exam  BP 132/85 (BP Location: Right Arm)   Pulse (!) 107   Temp 99.3 F (37.4 C) (Oral)   Resp 16   Ht '5\' 2"'$  (1.575 m)   Wt 52.3 kg   LMP 12/28/2021   SpO2 93%   BMI 21.08 kg/m  Gen:   Awake, no distress   Resp:  Normal effort  MSK:   Moves extremities without difficulty  Other:  Abdomen soft, notable tenderness in the suprapubic, periumbilical, and right lower quadrant with radiation through to the right lower back.  Patient sitting/leaning sideways on chair reportedly due to discomfort.  Medical Decision Making  Medically screening exam initiated at 12:34 PM.  Appropriate orders placed.  Ranika Mcniel was informed that the remainder of the evaluation will be completed by another provider, this initial triage assessment does not replace that evaluation, and the importance of remaining in the ED until their evaluation is complete.     Prince Rome, PA-C [id number]/56 [id number]

## 2022-01-13 NOTE — ED Triage Notes (Signed)
Patient reports that she had an ovarian mass with an abscess. Patient has been taking oral antibiotics for a month and WBC is 18.0. Patient went to the cancer center today and noted that the WBC was 18.0. Patient was told to come to the ED for hospital admission to receive IV antibiotics, r/o sepsis,  and receive a CT Scan.

## 2022-01-13 NOTE — Progress Notes (Signed)
GYNECOLOGIC ONCOLOGY NEW PATIENT CONSULTATION   Patient Name: [facility]  Patient Age: 41 y.o. Date of Service: 01/13/22 Referring Provider: Dr. Oris Drone  Primary Care Provider: Wendie Agreste, MD Consulting Provider: Jeral Pinch, MD   Assessment/Plan:  Premenopausal patient with known history of severe endometriosis referred after transvaginal aspiration of small endometrioma resulted in what is believed to be tubo-ovarian abscess with continued tachycardia, abdominal pain despite initiating second round of antibiotics earlier this week.  Discussed with the patient and her husband my concern today for ongoing infection.  She is visibly uncomfortable and is quite tender on exam.  Additionally, although not febrile, she is tachycardic.  Given ongoing symptoms, she was started on a second round of oral antibiotics earlier this week by Dr. Kerin Perna.  I recommended that we get labs today including CBC, CMP, blood cultures, urine culture, and vaginal cultures.  She has what feels to be a fairly fixed mass in her posterior cul-de-sac.  I am concerned that there may have been some enlargement of her endometrioma versus TOA since her recent admission.  I will order an outpatient CT scan.  If we can get approval for this quickly, we will plan to get a CT scan and then have the patient present to the emergency department.  Based on current hospital status, I think this will be the fastest way to have her admitted compared to trying to direct admit her.  Depending on findings on CT scan, she may benefit from pelvic ultrasound.  If the pelvic mass has indeed gotten bigger, the patient may benefit from percutaneous drainage given difficulty of antibiotic infiltration.  If picture, based on labs and imaging is most consistent with PID/TOA after her procedure in November, would recommend conservative management with IV antibiotics, possible drainage depending on size of the mass/cyst.  Definitive  surgery (removal of her remaining fallopian tube and ovary with or without hysterectomy) likely be very challenging in the setting of acute infection.  Typically, such surgery is only pursued if patients are failing to respond to conservative measures.  Given the somewhat unusual presentation after the type of procedure she had, I think reevaluation for any other potential sources for her current symptoms is important.  I will reach out to her referring provider (who also admitted her during recent treatment for presumed TOA) to give him an update that I am sending the patient to the emergency department for admission.  In terms of management with regard to her endometriosis, we discussed revisiting this issue once we have treated her current acute problem.  The patient and her husband were in the process of getting ready to start their fourth cycle of IVF.  A copy of this note was sent to the patient's referring provider.   75 minutes of total time was spent for this patient encounter, including preparation, face-to-face counseling with the patient and coordination of care, and documentation of the encounter.  Jeral Pinch, MD  Division of Gynecologic Oncology  Department of Obstetrics and Gynecology  [facility]  ___________________________________________  Chief Complaint: Chief Complaint  Patient presents with   Adnexal mass    History of Present Illness:  [facility] is a 41 y.o. y.o. female who is seen in consultation at the request of Dr. Kerin Perna for an evaluation of endometriosis.  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 A pelvic mass was identified on pelvic examination  and an ultrasound obtained showing:   Uterus measures 6.8 x 5.5 x 4.8 cm endometrial stripe 10.9 mm (last menstrual period 08/18/2014). The right ovary will there was a corpus luteum cyst was measured  14 x 16 x 14 mm with noted color flow in the periphery. Left ovary not seen but a large adnexal mass measuring 12.1 x 9.4 x 8.4 cm defect using internal low level echoes homogeneous echoes along with a solid calcification in the mass which measured 3.6 x 1.3 x 2.0 cm with arterial blood flow seen within the wall of the mass. There was no fluid in the cul-de-sac.   Follow-up CT scan was obtained showing: 1. 11.8 cm multiloculated cystic mass in the right adnexal space, concerning for neoplasm. While there is some mild smooth wall thickening along the medial margin of the lesion, there is no evidence for mural nodule or septal irregularity/thickening by CT and this lesion may be benign. 2. No evidence for ascites. 3. No lymphadenopathy in the pelvis.   CA-125 was 164 units per mL. Roma score was 2.83 (normal for a premenopausal woman is less than 1.31)  09/2014: Robotic-assisted left salpingo-oophorectomy with lysis of adhesions x 2 hours and left ureterolysis for retroperitoneal fibrosis obliterating the pararectal space. Findings at the time of surgery included retroperitoneal left ovarian mass measuring 12cm, replacing left ovary, densely adherent to sigmoid colon, left ovarian fossa and uterus and infiltrating into the retroperitoneum on the left causing adherence to the left ureter which required extensive ureterolysis to mobilize off the mass. The rectum was adherent to the posterior uterine lower segment and cervix and the right ovary was grossly normal with a corpus luteum. The right fallopian tube was slightly clubbed. Left ovarian cyst consistent with endometrioma with chocolate colored fluid.  Pathology confirmed endometrioma/endometriosis.  After this surgery, the patient underwent HSG which documented patency of her right fallopian tube.  She then conceived spontaneously in April 2017.  This resulted in a 6-week miscarriage.  She underwent stimulation cycles with IUI in 2019 but did not  conceive.  She then spontaneously conceived in October 2019, again resulting in a early pregnancy loss.  She required Vasoprost all for retained products of conception.  Subsequently, she had 3 IVF cycles, none of which produced viable and genetically normal embryos.  The last of these cycles was in 2022.  Patient was seen by Dr. Kerin Perna at the end of August 2023 to discuss possibility of fourth IVF cycle.  Labs at that initial visit included a CA125 of 31.2.  At some point subsequently, she underwent cyst aspiration in preparation for upcoming IVF cycle.  She had been taking Orilissa for the last month before this procedure happened.  She underwent transvaginal ultrasound guided ovarian endometrioma aspiration with 24 mm endometrioma aspirated.  She had doxycycline prophylaxis for this procedure and notes indicate that she was prescribed 5 days of doxycycline after the procedure.  The patient was seen back in clinic in early November.  She had abdominal pain at this time after stopping her Freida Busman 1 week previously after running out of medication.  Pelvic ultrasound at that time showed a right endometrioma measuring 4 x 3.1 x 3.7 cm and a resolving left anechoic cystic structure measuring up to 2.3 cm.  No free fluid noted in the pelvis.  Started on Aygestin and Solara for 2 weeks to help suppress estrogen.    Plan had been for her to be on medications for 2 weeks and then to follow-up  in clinic.  Given pain and fevers, she presented to the emergency department about a week after this visit.  In the emergency department, the patient was noted to have leukocytosis of 25.  She was febrile and tachycardic on admission.  Cultures were drawn.  CT scan and ultrasound were performed with results noted below.  12/26/21: CT A/P 1. Complex, multi-septated cystic right adnexal mass measuring up to 9.2 cm in diameter, new compared with previous CT from 2016. Appearance is nonspecific and could reflect an  atypical endometrioma or tubo-ovarian abscess. Correlate with recent surgical procedure/findings and presumed outside imaging. Neoplasm not excluded by this examination. Ultrasound may be helpful for further characterization. 2. Mild pelvic soft tissue edema without focal fluid collection, ascites or free air. The appendix is not clearly demonstrated. 3. Mildly prominent retroperitoneal lymph nodes, likely reactive. 4. No acute findings in the chest.  12/26/21: Pelvic ultrasound  There is marked enlargement of right ovary. There are multiple smooth marginated lesions of varying sizes largest measuring 6.6 cm in maximum diameter. Findings suggestive multiple hemorrhagic right ovarian cysts. There is no evidence of right ovarian torsion. Left ovary is surgically absent. There is inhomogeneous echogenicity in myometrium without no discrete focal abnormalities.  Patient was admitted and treated with IV cefepime, flagyl, vancomycin. Urine culture was negative negative, wet prep showed clue cells, blood cultures were also negative. She was discharged home on Augmentin and doxycycline for 10 days.  She was seen for follow-up on November 29.  Pelvic ultrasound performed in clinic showed a right tubo-ovarian complex adherent to the uterus measured 10.6 x 8.8 x 7.8 cm.  Today, the patient reports that initially after the procedure, she was doing well for approximately 2 weeks.  She then developed abdominal pain and fevers, with a temperature up to 107.  She was instructed to wait 2 weeks but after 1 week, was so miserable that she went to the emergency department.  She was admitted and treated with IV antibiotics before being discharged on oral antibiotics.  She saw Dr. Kerin Perna earlier this week.  Given continued symptoms, she was started on second round of antibiotics with doxycycline and Augmentin.  She notes that overall her symptoms are mildly better.  She has constant pain that starts in her right upper  abdomen, radiates down to her right pelvis and around to her back.  It makes it hard to sit on the right side.  If she uses tramadol or over-the-counter pain medication, she gets some relief for about 2 or 3 hours.  She denies any fever since hospitalization but wakes up almost every night with sweats and soaked sheets.  She has to get up and take a bath when this happens.  She has intermittent nausea, had nausea and emesis yesterday.  She reports having an appetite but having difficulty eating because she is lost her taste and because of emesis when she does eat.  Has had some intermittent diarrhea.  She is down to 115 pounds from 127 since getting sick.  Last menstrual period was during her hospitalization in mid November.  She denies any vaginal bleeding or discharge since.  She is stopped all of her endometriosis medications with the start of antibiotics because of how badly she has been feeling.  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   no meds 1997, treated in June 2016 x 1 month- states neg chest x ray June 2016     PAST SURGICAL HISTORY:  Past Surgical History:  Procedure Laterality Date   LAPAROSCOPIC UNILATERAL SALPINGO OOPHERECTOMY N/A 09/29/2014   Procedure: LAPAROSCOPIC LEFT SALPINGO OOPHORECTOMY ADHESIOLYSIS ;  Surgeon: Everitt Amber, MD;  Location: [facility];  Service: Gynecology;  Laterality: N/A;   ROBOTIC ASSISTED LAPAROSCOPIC OVARIAN CYSTECTOMY N/A 09/29/2014   Procedure: SI ROBOTIC ASSISTED LAPAROSCOPIC OVARIAN CYSTECTOMY;  Surgeon: Everitt Amber, MD;  Location: [facility];  Service: Gynecology;  Laterality: N/A;   TYMPANOPLASTY Left 08/19/2019   Procedure: LEFT TYMPANOPLASTY;  Surgeon:  Rozetta Nunnery, MD;  Location: [facility];  Service: ENT;  Laterality: Left;    OB/GYN HISTORY:  OB History  Gravida Para Term Preterm AB Living  2 0 0 0 2 0  SAB IAB Ectopic Multiple Live Births  2 0 0 0      # Outcome Date GA Lbr Len/2nd Weight Sex Delivery Anes PTL Lv  2 SAB           1 SAB             Patient's last menstrual period was 12/28/2021.  Age at menarche: 40  Age at menopause: n/a Hx of STDs: denies Last pap: 08/2021 - NIML, HR HPV negative History of abnormal pap smears: Denies  SCREENING STUDIES:  Last mammogram: 2023   MEDICATIONS: No facility-administered encounter medications on file as of 01/13/2022.   Outpatient Encounter Medications as of 01/13/2022  Medication Sig   amoxicillin-clavulanate (AUGMENTIN) 875-125 MG tablet Take 1 tablet by mouth 2 (two) times daily.   doxycycline (VIBRAMYCIN) 50 MG capsule Take 2 capsules (100 mg total) by mouth 2 (two) times daily.   [DISCONTINUED] ibuprofen (ADVIL) 200 MG tablet Take 400 mg by mouth every 6 (six) hours as needed for mild pain.   [DISCONTINUED] JARDIANCE 25 MG TABS tablet Take 25 mg by mouth daily.   [DISCONTINUED] letrozole (FEMARA) 2.5 MG tablet Take 2.5 mg by mouth daily.   [DISCONTINUED] metFORMIN (GLUCOPHAGE-XR) 500 MG 24 hr tablet Take 1,000 mg by mouth 2 (two) times daily.   [DISCONTINUED] norethindrone (AYGESTIN) 5 MG tablet Take 5 mg by mouth daily.   [DISCONTINUED] rosuvastatin (CRESTOR) 40 MG tablet Take 40 mg by mouth daily.    ALLERGIES:  No Known Allergies   FAMILY HISTORY:  Family History  Problem Relation Age of Onset   Hyperlipidemia Mother    Hypertension Mother    Hyperlipidemia Father      SOCIAL HISTORY:  Social Connections: Not on file    REVIEW OF SYSTEMS:  Denies appetite changes, fevers, chills, fatigue, unexplained weight changes. Denies hearing loss, neck lumps or masses, mouth sores, ringing in ears or voice changes. Denies cough or  wheezing.  Denies shortness of breath. Denies chest pain or palpitations. Denies leg swelling. Denies abdominal distention, pain, blood in stools, constipation, diarrhea, nausea, vomiting, or early satiety. Denies pain with intercourse, dysuria, frequency, hematuria or incontinence. Denies hot flashes, pelvic pain, vaginal bleeding or vaginal discharge.   Denies joint pain, back pain or muscle pain/cramps. Denies itching, rash, or wounds. Denies dizziness, headaches, numbness or seizures. Denies swollen lymph nodes or glands, denies easy bruising or bleeding. Denies anxiety, depression, confusion, or decreased concentration.  Physical Exam:  Vital Signs for this encounter:  Blood pressure 121/81, pulse (!) 108, temperature 98.6 F (37 C), temperature source Oral, resp. rate 18, height '5\' 2"'$  (1.575 m), weight 115 lb 4 oz (52.3 kg), last menstrual period 12/28/2021, SpO2 99 %. Body mass index is 21.08 kg/m. General: Alert, oriented, no acute distress although looks uncomfortable during our entire visit.  HEENT: Normocephalic, atraumatic. Sclera anicteric.  Chest: Clear to auscultation bilaterally. No wheezes, rhonchi, or rales. Cardiovascular: Tachycardic with heart rate in the 110s, regular rhythm, no murmurs, rubs, or gallops.  Abdomen: Normoactive bowel sounds. Soft, nondistended, moderate tenderness to deep palpation in the right lower quadrant, mild guarding but no rebound.  Otherwise nontender on abdominal palpation. No masses or hepatosplenomegaly appreciated. No palpable fluid wave.  Extremities: Grossly normal range of motion. Warm, well perfused. No edema bilaterally.  Skin: No rashes or lesions.  Lymphatics: No cervical, supraclavicular, or inguinal adenopathy.  GU:  Normal external female genitalia. No lesions. No discharge or bleeding.             Bladder/urethra:  No lesions or masses, well supported bladder             Vagina: Well-rugated vaginal mucosa, no lesions noted.              Cervix: Normal appearing, no lesions.  Culture swabs collected.             Uterus/adnexa: Uterus is fixed.  There is a firm fixed mass within the cul-de-sac, that I cannot appreciate whether this is a retroverted uterus versus pelvic mass.  This does not move.  Patient has pain with deep palpation with the abdominal hand in the right lower quadrant.  Rectal: Uterus versus pelvic mass palpable on rectal exam, rectum feels tethered to this.  LABORATORY AND RADIOLOGIC DATA:  Outside medical records were reviewed to synthesize the above history, along with the history and physical obtained during the visit.   Lab Results  Component Value Date   WBC 18.0 (H) 01/13/2022   HGB 10.1 (L) 01/13/2022   HCT 31.5 (L) 01/13/2022   PLT 671 (H) 01/13/2022   GLUCOSE 277 (H) 01/13/2022   CHOL 241 (H) 01/23/2020   TRIG 227 (H) 01/23/2020   HDL 45 01/23/2020   LDLCALC 154 (H) 01/23/2020   ALT 97 (H) 01/13/2022   AST 70 (H) 01/13/2022   NA 131 (L) 01/13/2022   K 4.1 01/13/2022   CL 94 (L) 01/13/2022   CREATININE 0.62 01/13/2022   BUN 10 01/13/2022   CO2 27 01/13/2022   TSH 1.120 04/02/2019   INR 1.4 (H) 12/26/2021   HGBA1C 6.9 (H) 04/22/2020

## 2022-01-13 NOTE — ED Provider Notes (Signed)
[facility] DEPT Provider Note   CSN: [id number] Arrival date & time: 01/13/22  1125     History  Chief Complaint  Patient presents with   Abnormal Lab    Marie Hill is a 41 y.o. female.  Pt is a 41 yo female with a pmhx significant for for severe endometriosis and TOA.  Pt was admitted from 11/13-11/17 for sepsis by Dr. Kerin Perna due to an infected endometrioma of the ovary.  She was given IV abx and was d/c home on Augmentin.  She was started on abx earlier this week by Dr. Kerin Perna.  She followed up with Dr. Berline Lopes this morning.  She was still uncomfortable and continued to be tachycardic.  She ordered labs this am which showed an elevated wbc.  Pt was sent to the ED for further evaluation.  Pt said her abd feels bad, but she does not want any narcotics right now.  She said they made her feel too "out of it."         Home Medications Prior to Admission medications   Medication Sig Start Date End Date Taking? Authorizing Provider  amoxicillin-clavulanate (AUGMENTIN) 875-125 MG tablet Take 1 tablet by mouth 2 (two) times daily. 12/30/21   Governor Specking, MD  doxycycline (VIBRAMYCIN) 50 MG capsule Take 2 capsules (100 mg total) by mouth 2 (two) times daily. 12/30/21   Governor Specking, MD      Allergies    Patient has no known allergies.    Review of Systems   Review of Systems  Gastrointestinal:  Positive for abdominal pain.  All other systems reviewed and are negative.   Physical Exam Updated Vital Signs BP 134/86 (BP Location: Right Arm)   Pulse 91   Temp 99.3 F (37.4 C) (Oral)   Resp 18   Ht '5\' 2"'$  (1.575 m)   Wt 52.3 kg   LMP 12/28/2021   SpO2 98%   BMI 21.08 kg/m  Physical Exam Vitals and nursing note reviewed.  Constitutional:      Appearance: Normal appearance.  HENT:     Head: Normocephalic and atraumatic.     Right Ear: External ear normal.     Left Ear: External ear normal.     Nose: Nose normal.      Mouth/Throat:     Mouth: Mucous membranes are dry.  Eyes:     Extraocular Movements: Extraocular movements intact.     Conjunctiva/sclera: Conjunctivae normal.     Pupils: Pupils are equal, round, and reactive to light.  Cardiovascular:     Rate and Rhythm: Regular rhythm. Tachycardia present.     Pulses: Normal pulses.     Heart sounds: Normal heart sounds.  Pulmonary:     Effort: Pulmonary effort is normal.     Breath sounds: Normal breath sounds.  Abdominal:     General: Abdomen is flat. Bowel sounds are normal.     Palpations: Abdomen is soft.     Tenderness: There is abdominal tenderness in the right lower quadrant.  Musculoskeletal:        General: Normal range of motion.     Cervical back: Normal range of motion and neck supple.  Skin:    General: Skin is warm.     Capillary Refill: Capillary refill takes less than 2 seconds.  Neurological:     General: No focal deficit present.     Mental Status: She is alert and oriented to person, place, and time.  Psychiatric:  Mood and Affect: Mood normal.        Behavior: Behavior normal.     ED Results / Procedures / Treatments   Labs (all labs ordered are listed, but only abnormal results are displayed) Labs Reviewed  I-STAT BETA HCG BLOOD, ED ([facility], [facility], [facility] ONLY)    EKG None  Radiology US Pelvis Complete  Result Date: 01/13/2022 CLINICAL DATA:  Pelvic pain. EXAM: TRANSABDOMINAL AND TRANSVAGINAL ULTRASOUND OF PELVIS DOPPLER ULTRASOUND OF OVARIES TECHNIQUE: Both transabdominal and transvaginal ultrasound examinations of the pelvis were performed. Transabdominal technique was performed for global imaging of the pelvis including uterus, ovaries, adnexal regions, and pelvic cul-de-sac. It was necessary to proceed with endovaginal exam following the transabdominal exam to visualize the endometrium and ovaries. Color and duplex Doppler ultrasound was utilized to evaluate blood flow to the ovaries. COMPARISON:  CT scan of same  day.  Ultrasound of December 26, 2021. FINDINGS: Uterus Measurements: 8.2 x 5.7 x 3.2 cm = volume: 78 mL. No fibroids or other mass visualized. Endometrium Not well visualized due to large right adnexal mass. Right ovary Measurements: 8.5 x 8.5 x 7.6 cm = volume: 287 mL. Large heterogeneous but predominantly solid mass is noted highly concerning for ovarian malignancy or neoplasm. Status post left oophorectomy. Pulsed Doppler evaluation of right ovary demonstrates normal low-resistance arterial and venous waveforms. Other findings No abnormal free fluid. IMPRESSION: Large heterogeneous but predominantly solid mass is noted in the right adnexal region highly concerning for right ovarian neoplasm or malignancy. MRI is recommended for further evaluation as well as gynecologic consultation. Status post left oophorectomy. No definite evidence of right ovarian torsion. Electronically Signed   By: Marijo Conception M.D.   On: 01/13/2022 14:42   CT ABDOMEN PELVIS W CONTRAST  Result Date: 01/13/2022 CLINICAL DATA:  Abdominal pain.  History of endometriosis. EXAM: CT ABDOMEN AND PELVIS WITH CONTRAST TECHNIQUE: Multidetector CT imaging of the abdomen and pelvis was performed using the standard protocol following bolus administration of intravenous contrast. RADIATION DOSE REDUCTION: This exam was performed according to the departmental dose-optimization program which includes automated exposure control, adjustment of the mA and/or kV according to patient size and/or use of iterative reconstruction technique. CONTRAST:  100 mL OMNIPAQUE IOHEXOL 300 MG/ML  SOLN COMPARISON:  09/21/2014 FINDINGS: Lower chest: No acute abnormality. Hepatobiliary: Medial right lobe hypodensity consistent with focal fatty infiltration. Otherwise no hepatic lesions identified and no intrahepatic biliary ductal dilatation. No gallstones, gallbladder wall thickening, or extrahepatic biliary dilatation. Pancreas: Unremarkable. No pancreatic ductal  dilatation or surrounding inflammatory changes. Spleen: Normal in size without focal abnormality. Adrenals/Urinary Tract: There are no precontrast images which limits the ability to assess for the presence of stones. Left kidney demonstrates no hydronephrosis. On the right there is hydronephrosis and hydroureter presumably on the basis of complex mass in the pelvis. Urinary bladder is displaced but otherwise unremarkable. Stomach/Bowel: Stomach is within normal limits. Appendix appears normal. No evidence of bowel wall thickening, distention, or inflammatory changes. Thickening of the descending and sigmoid colon likely on the basis of normal peristalsis without significant pericolonic inflammatory change identified. Vascular/Lymphatic: No significant vascular findings are present. No enlarged abdominal or pelvic lymph nodes. Reproductive: Uterus is displaced by a right adnexal complex cystic peripherally enhancing multiloculated lesion measuring 13 x 13 x 10 cm. Presumably this represents sequelae of patient's history of endometriosis. Other etiologies like neoplasm or abscess are not excluded on the basis of this examination. Other: No abdominal wall hernia or abnormality. No abdominopelvic  ascites. Musculoskeletal: No acute or significant osseous findings. IMPRESSION: 1. Large cystic, multi loculated and peripherally and centrally enhancing process in the right adnexa presumably related to patient's history of endometriosis. Differential would include neoplasm or abscess. 2. Right-sided mild hydronephrosis and hydroureter that appears to be in the basis of mass effect from the right adnexal process. Electronically Signed   By: Sammie Bench M.D.   On: 01/13/2022 14:42    Procedures Procedures    Medications Ordered in ED Medications  0.9 %  sodium chloride infusion (has no administration in time range)  piperacillin-tazobactam (ZOSYN) IVPB 3.375 g (has no administration in time range)  sodium  chloride 0.9 % bolus 1,000 mL (1,000 mLs Intravenous New Bag/Given 01/13/22 1450)  sodium chloride (PF) 0.9 % injection (  Given by Other 01/13/22 1400)  iohexol (OMNIPAQUE) 300 MG/ML solution 100 mL (100 mLs Intravenous Contrast Given 01/13/22 1356)  piperacillin-tazobactam (ZOSYN) IVPB 3.375 g (0 g Intravenous Stopped 01/13/22 1538)  ketorolac (TORADOL) 30 MG/ML injection 30 mg (30 mg Intravenous Given 01/13/22 1526)    ED Course/ Medical Decision Making/ A&P                           Medical Decision Making Amount and/or Complexity of Data Reviewed Radiology: ordered.  Risk Prescription drug management. Decision regarding hospitalization.   This patient presents to the ED for concern of abd pain, this involves an extensive number of treatment options, and is a complaint that carries with it a high risk of complications and morbidity.  The differential diagnosis includes TOA, other infection   Co morbidities that complicate the patient evaluation  Endometriosis and TOA   Additional history obtained:  Additional history obtained from epic chart review   Lab Tests:  I reviewed labs from earlier today.  Wbc elevated at 18.0, hgb stable at 10.1; cmp with glucose elevated at 277; lfts also elevated. Ua neg.  Pt had blood cultures drawn today as well.  Imaging Studies ordered:  I ordered imaging studies including CT abd/pelvis; [place]  I independently visualized and interpreted imaging which showed  US pelvis: Large heterogeneous but predominantly solid mass is noted in the  right adnexal region highly concerning for right ovarian neoplasm or  malignancy. MRI is recommended for further evaluation as well as  gynecologic consultation.    Status post left oophorectomy.    No definite evidence of right ovarian torsion.  CT abd/pelvis: 1. Large cystic, multi loculated and peripherally and centrally  enhancing process in the right adnexa presumably related to  patient's history of  endometriosis. Differential would include  neoplasm or abscess.  2. Right-sided mild hydronephrosis and hydroureter that appears to  be in the basis of mass effect from the right adnexal process.   I agree with the radiologist interpretation   Cardiac Monitoring:  The patient was maintained on a cardiac monitor.  I personally viewed and interpreted the cardiac monitored which showed an underlying rhythm of: st   Medicines ordered and prescription drug management:  I ordered medication including ivfs, zosyn  for sepsis  Reevaluation of the patient after these medicines showed that the patient improved I have reviewed the patients home medicines and have made adjustments as needed   Test Considered:  Ct/us   Critical Interventions:  Iv abx   Consultations Obtained:  I requested consultation with the gyn (Dr. Kerin Perna),  and discussed lab and imaging findings as well as pertinent plan -  he will admit.  He requests consult with IR.   Problem List / ED Course:  Large right cystic mass:  likely infection as she's had f/c.  She's been on several rounds of abx without tx.  IV zosyn given in the ED.   Reevaluation:  After the interventions noted above, I reevaluated the patient and found that they have :improved   Social Determinants of Health:  Lives at home   Dispostion:  After consideration of the diagnostic results and the patients response to treatment, I feel that the patent would benefit from admission.          Final Clinical Impression(s) / ED Diagnoses Final diagnoses:  Ovarian mass, right  Sepsis, due to unspecified organism, unspecified whether acute organ dysfunction present [facility])    Rx / [place] Orders ED Discharge Orders     None         Isla Pence, MD 01/13/22 [phone]

## 2022-01-13 NOTE — ED Notes (Signed)
Pt finishing up ultra sound in triage

## 2022-01-14 ENCOUNTER — Inpatient Hospital Stay (HOSPITAL_COMMUNITY): Payer: Commercial Managed Care - HMO

## 2022-01-14 LAB — CBC WITH DIFFERENTIAL/PLATELET
Abs Immature Granulocytes: 0.46 10*3/uL — ABNORMAL HIGH (ref 0.00–0.07)
Basophils Absolute: 0.1 10*3/uL (ref 0.0–0.1)
Basophils Relative: 0 %
Eosinophils Absolute: 0.1 10*3/uL (ref 0.0–0.5)
Eosinophils Relative: 0 %
HCT: 28.5 % — ABNORMAL LOW (ref 36.0–46.0)
Hemoglobin: 8.4 g/dL — ABNORMAL LOW (ref 12.0–15.0)
Immature Granulocytes: 2 %
Lymphocytes Relative: 14 %
Lymphs Abs: 2.9 10*3/uL (ref 0.7–4.0)
MCH: 24.2 pg — ABNORMAL LOW (ref 26.0–34.0)
MCHC: 29.5 g/dL — ABNORMAL LOW (ref 30.0–36.0)
MCV: 82.1 fL (ref 80.0–100.0)
Monocytes Absolute: 1.3 10*3/uL — ABNORMAL HIGH (ref 0.1–1.0)
Monocytes Relative: 7 %
Neutro Abs: 15.2 10*3/uL — ABNORMAL HIGH (ref 1.7–7.7)
Neutrophils Relative %: 77 %
Platelets: 583 10*3/uL — ABNORMAL HIGH (ref 150–400)
RBC: 3.47 MIL/uL — ABNORMAL LOW (ref 3.87–5.11)
RDW: 15.2 % (ref 11.5–15.5)
WBC: 20 10*3/uL — ABNORMAL HIGH (ref 4.0–10.5)
nRBC: 0 % (ref 0.0–0.2)

## 2022-01-14 LAB — COMPREHENSIVE METABOLIC PANEL
ALT: 57 U/L — ABNORMAL HIGH (ref 0–44)
AST: 23 U/L (ref 15–41)
Albumin: 2.6 g/dL — ABNORMAL LOW (ref 3.5–5.0)
Alkaline Phosphatase: 518 U/L — ABNORMAL HIGH (ref 38–126)
Anion gap: 11 (ref 5–15)
BUN: 9 mg/dL (ref 6–20)
CO2: 21 mmol/L — ABNORMAL LOW (ref 22–32)
Calcium: 9 mg/dL (ref 8.9–10.3)
Chloride: 107 mmol/L (ref 98–111)
Creatinine, Ser: 0.68 mg/dL (ref 0.44–1.00)
GFR, Estimated: >60 mL/min (ref >60)
Glucose, Bld: 121 mg/dL — ABNORMAL HIGH (ref 70–99)
Potassium: 3.6 mmol/L (ref 3.5–5.1)
Sodium: 139 mmol/L (ref 135–145)
Total Bilirubin: 0.6 mg/dL (ref 0.3–1.2)
Total Protein: 7.7 g/dL (ref 6.5–8.1)

## 2022-01-14 LAB — URINE CULTURE: Culture: NO GROWTH

## 2022-01-14 LAB — GLUCOSE, CAPILLARY
Glucose-Capillary: 107 mg/dL — ABNORMAL HIGH (ref 70–99)
Glucose-Capillary: 115 mg/dL — ABNORMAL HIGH (ref 70–99)
Glucose-Capillary: 140 mg/dL — ABNORMAL HIGH (ref 70–99)
Glucose-Capillary: 153 mg/dL — ABNORMAL HIGH (ref 70–99)
Glucose-Capillary: 184 mg/dL — ABNORMAL HIGH (ref 70–99)

## 2022-01-14 MED ORDER — DEXTROSE-NACL 5-0.45 % IV SOLN: INTRAVENOUS | Status: DC

## 2022-01-14 MED ORDER — KETOROLAC TROMETHAMINE 30 MG/ML IJ SOLN
30.0000 mg | Freq: Four times a day (QID) | INTRAMUSCULAR | Status: DC | PRN
  Administered 2022-01-14 – 2022-01-15 (×3): 30 mg via INTRAVENOUS
  Filled 2022-01-14 (×3): qty 1

## 2022-01-14 MED ORDER — MIDAZOLAM HCL 2 MG/2ML IJ SOLN
INTRAMUSCULAR | Status: AC | PRN
  Administered 2022-01-14: 0.5 mg via INTRAVENOUS
  Administered 2022-01-14: 1 mg via INTRAVENOUS
  Administered 2022-01-14: .5 mg via INTRAVENOUS

## 2022-01-14 MED ORDER — INSULIN ASPART 100 UNIT/ML IJ SOLN
0.0000 [IU] | INTRAMUSCULAR | Status: DC
  Administered 2022-01-14: 3 [IU] via SUBCUTANEOUS
  Administered 2022-01-15 (×2): 2 [IU] via SUBCUTANEOUS
  Administered 2022-01-15: 3 [IU] via SUBCUTANEOUS

## 2022-01-14 MED ORDER — ENOXAPARIN SODIUM 40 MG/0.4ML IJ SOSY
40.0000 mg | PREFILLED_SYRINGE | INTRAMUSCULAR | Status: DC
  Administered 2022-01-14 – 2022-01-19 (×6): 40 mg via SUBCUTANEOUS
  Filled 2022-01-14 (×6): qty 0.4

## 2022-01-14 MED ORDER — FENTANYL CITRATE (PF) 100 MCG/2ML IJ SOLN
INTRAMUSCULAR | Status: AC | PRN
  Administered 2022-01-14: 50 ug via INTRAVENOUS
  Administered 2022-01-14 (×2): 25 ug via INTRAVENOUS

## 2022-01-14 MED ORDER — MORPHINE SULFATE (PF) 2 MG/ML IV SOLN
2.0000 mg | INTRAVENOUS | Status: DC | PRN
  Filled 2022-01-14: qty 1

## 2022-01-14 MED ORDER — SODIUM CHLORIDE 0.9% FLUSH
5.0000 mL | Freq: Three times a day (TID) | INTRAVENOUS | Status: DC
  Administered 2022-01-17 – 2022-01-20 (×10): 5 mL

## 2022-01-14 MED ORDER — LIDOCAINE HCL (PF) 1 % IJ SOLN
INTRAMUSCULAR | Status: AC | PRN
  Administered 2022-01-14: 10 mL

## 2022-01-14 MED ORDER — MIDAZOLAM HCL 2 MG/2ML IJ SOLN
INTRAMUSCULAR | Status: AC
  Filled 2022-01-14: qty 4

## 2022-01-14 MED ORDER — FENTANYL CITRATE (PF) 100 MCG/2ML IJ SOLN
INTRAMUSCULAR | Status: AC
  Filled 2022-01-14: qty 4

## 2022-01-14 NOTE — Plan of Care (Signed)

## 2022-01-14 NOTE — TOC Initial Note (Signed)
Transition of Care [facility]) - Initial/Assessment Note    Patient Details  Name: Marie Hill MRN: [id number] Date of Birth: 01/15/1981  Transition of Care [facility]) CM/SW Contact:    Henrietta Dine, RN Phone Number: 01/14/2022, 10:17 AM  Clinical Narrative:                  Transition of Care [facility]) Screening Note   Patient Details  Name: Marie Hill Date of Birth: 12-12-1980   Transition of Care [facility] CM/SW Contact:    Henrietta Dine, RN Phone Number: 01/14/2022, 10:17 AM    Transition of Care Department [facility]) has reviewed patient and no TOC needs have been identified at this time. We will continue to monitor patient advancement through interdisciplinary progression rounds. If new patient transition needs arise, please place a TOC consult.          Patient Goals and CMS Choice        Expected Discharge Plan and Services                                                Prior Living Arrangements/Services                       Activities of Daily Living Home Assistive Devices/Equipment: CBG Meter, Eyeglasses ADL Screening (condition at time of admission) Patient's cognitive ability adequate to safely complete daily activities?: Yes Is the patient deaf or have difficulty hearing?: No Does the patient have difficulty seeing, even when wearing glasses/contacts?: No Does the patient have difficulty concentrating, remembering, or making decisions?: No Patient able to express need for assistance with ADLs?: Yes Does the patient have difficulty dressing or bathing?: No Independently performs ADLs?: Yes (appropriate for developmental age) Does the patient have difficulty walking or climbing stairs?: No Weakness of Legs: None Weakness of Arms/Hands: None  Permission Sought/Granted                  Emotional Assessment              Admission diagnosis:  Ovarian cystic mass [N83.209] Ovarian mass, right [N83.8] Sepsis, due to  unspecified organism, unspecified whether acute organ dysfunction present [facility]) [A41.9] Patient Active Problem List   Diagnosis Date Noted   Ovarian cystic mass 01/13/2022   Protein-calorie malnutrition, severe 12/30/2021   Sepsis ([place]) 12/26/2021   Mass of left axilla 07/04/2016   Miscarriage 06/30/2015   Endometriosis of ovary 11/02/2014   Infertility, female, 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 [place] [address] Phone: [phone] Fax: [phone]     Social Determinants of Health (SDOH) Interventions Food Insecurity Interventions: Intervention Not Indicated Housing Interventions: Intervention Not Indicated Transportation Interventions: Intervention Not Indicated Utilities Interventions: Intervention Not Indicated  Readmission Risk Interventions    12/28/2021    2:04 PM  Readmission Risk Prevention Plan  Post Dischage Appt Complete  Medication Screening Complete  Transportation Screening Complete

## 2022-01-14 NOTE — Procedures (Signed)
Interventional Radiology Procedure Note  Date of Procedure: 01/14/2022  Procedure: CT drain placement   Findings:  1. Ct drain placement into pelvic abscess. 12 Fr locking. Placed to bulb. Sample sent for micro analysis.    Complications: No immediate complications noted.   Estimated Blood Loss: minimal  Follow-up and Recommendations: 1. Flush drain x3 daily  2. Per GYN    Albin Felling, MD  Vascular & Interventional Radiology  01/14/2022 4:49 PM

## 2022-01-14 NOTE — Progress Notes (Signed)
Subjective: Interval History: mild abdominal pan  Objective: Vital signs in last 24 hours: Temp:  [98.5 F (36.9 C)-100.5 F (38.1 C)] 98.8 F (37.1 C) (12/02 0553) Pulse Rate:  [91-107] 98 (12/02 0523) Resp:  [16-22] 18 (12/02 0523) BP: (116-134)/(67-93) 120/76 (12/02 0523) SpO2:  [93 %-98 %] 98 % (12/02 0523) Weight:  [52.3 kg] 52.3 kg (12/01 1232)  Intake/Output from previous day: 12/01 0701 - 12/02 0700 In: 2008 [P.O.:360; I.V.:1548] Out: 900 [Urine:900] Intake/Output this shift: No intake/output data recorded.  BP 120/76 (BP Location: Left Arm)   Pulse 98   Temp 98.8 F (37.1 C) (Oral)   Resp 18   Ht '5\' 2"'$  (1.575 m)   Wt 52.3 kg   LMP 12/28/2021   SpO2 98%   BMI 21.08 kg/m   General Appearance:    Alert, cooperative, no distress, appears stated age  Abdomen:     Soft, non-tender, bowel sounds active all four quadrants,    no masses, no organomegaly  Extremities:   Extremities normal, atraumatic, no cyanosis or edema    Results for orders placed or performed during the hospital encounter of 01/13/22 (from the past 24 hour(s))  I-Stat beta hCG blood, ED     Status: None   Collection Time: 01/13/22 12:52 PM  Result Value Ref Range   I-stat hCG, quantitative <5.0 <5 mIU/mL   Comment 3          Glucose, capillary     Status: Abnormal   Collection Time: 01/14/22  7:44 AM  Result Value Ref Range   Glucose-Capillary 153 (H) 70 - 99 mg/dL    Studies/Results: US Pelvis Complete  Result Date: 01/13/2022 CLINICAL DATA:  Pelvic pain. EXAM: TRANSABDOMINAL AND TRANSVAGINAL ULTRASOUND OF PELVIS DOPPLER ULTRASOUND OF OVARIES TECHNIQUE: Both transabdominal and transvaginal ultrasound examinations of the pelvis were performed. Transabdominal technique was performed for global imaging of the pelvis including uterus, ovaries, adnexal regions, and pelvic cul-de-sac. It was necessary to proceed with endovaginal exam following the transabdominal exam to visualize the endometrium  and ovaries. Color and duplex Doppler ultrasound was utilized to evaluate blood flow to the ovaries. COMPARISON:  CT scan of same day.  Ultrasound of December 26, 2021. FINDINGS: Uterus Measurements: 8.2 x 5.7 x 3.2 cm = volume: 78 mL. No fibroids or other mass visualized. Endometrium Not well visualized due to large right adnexal mass. Right ovary Measurements: 8.5 x 8.5 x 7.6 cm = volume: 287 mL. Large heterogeneous but predominantly solid mass is noted highly concerning for ovarian malignancy or neoplasm. Status post left oophorectomy. Pulsed Doppler evaluation of right ovary demonstrates normal low-resistance arterial and venous waveforms. Other findings No abnormal free fluid. IMPRESSION: Large heterogeneous but predominantly solid mass is noted in the right adnexal region highly concerning for right ovarian neoplasm or malignancy. MRI is recommended for further evaluation as well as gynecologic consultation. Status post left oophorectomy. No definite evidence of right ovarian torsion. Electronically Signed   By: Marijo Conception M.D.   On: 01/13/2022 14:42   US Transvaginal Non-OB  Result Date: 01/13/2022 CLINICAL DATA:  Pelvic pain. EXAM: TRANSABDOMINAL AND TRANSVAGINAL ULTRASOUND OF PELVIS DOPPLER ULTRASOUND OF OVARIES TECHNIQUE: Both transabdominal and transvaginal ultrasound examinations of the pelvis were performed. Transabdominal technique was performed for global imaging of the pelvis including uterus, ovaries, adnexal regions, and pelvic cul-de-sac. It was necessary to proceed with endovaginal exam following the transabdominal exam to visualize the endometrium and ovaries. Color and duplex Doppler ultrasound was utilized to  evaluate blood flow to the ovaries. COMPARISON:  CT scan of same day.  Ultrasound of December 26, 2021. FINDINGS: Uterus Measurements: 8.2 x 5.7 x 3.2 cm = volume: 78 mL. No fibroids or other mass visualized. Endometrium Not well visualized due to large right adnexal mass. Right  ovary Measurements: 8.5 x 8.5 x 7.6 cm = volume: 287 mL. Large heterogeneous but predominantly solid mass is noted highly concerning for ovarian malignancy or neoplasm. Status post left oophorectomy. Pulsed Doppler evaluation of right ovary demonstrates normal low-resistance arterial and venous waveforms. Other findings No abnormal free fluid. IMPRESSION: Large heterogeneous but predominantly solid mass is noted in the right adnexal region highly concerning for right ovarian neoplasm or malignancy. MRI is recommended for further evaluation as well as gynecologic consultation. Status post left oophorectomy. No definite evidence of right ovarian torsion. Electronically Signed   By: Marijo Conception M.D.   On: 01/13/2022 14:42   [place] Art/Ven Flow Abd Pelv Doppler  Result Date: 01/13/2022 CLINICAL DATA:  Pelvic pain. EXAM: TRANSABDOMINAL AND TRANSVAGINAL ULTRASOUND OF PELVIS DOPPLER ULTRASOUND OF OVARIES TECHNIQUE: Both transabdominal and transvaginal ultrasound examinations of the pelvis were performed. Transabdominal technique was performed for global imaging of the pelvis including uterus, ovaries, adnexal regions, and pelvic cul-de-sac. It was necessary to proceed with endovaginal exam following the transabdominal exam to visualize the endometrium and ovaries. Color and duplex Doppler ultrasound was utilized to evaluate blood flow to the ovaries. COMPARISON:  CT scan of same day.  Ultrasound of December 26, 2021. FINDINGS: Uterus Measurements: 8.2 x 5.7 x 3.2 cm = volume: 78 mL. No fibroids or other mass visualized. Endometrium Not well visualized due to large right adnexal mass. Right ovary Measurements: 8.5 x 8.5 x 7.6 cm = volume: 287 mL. Large heterogeneous but predominantly solid mass is noted highly concerning for ovarian malignancy or neoplasm. Status post left oophorectomy. Pulsed Doppler evaluation of right ovary demonstrates normal low-resistance arterial and venous waveforms. Other findings No abnormal  free fluid. IMPRESSION: Large heterogeneous but predominantly solid mass is noted in the right adnexal region highly concerning for right ovarian neoplasm or malignancy. MRI is recommended for further evaluation as well as gynecologic consultation. Status post left oophorectomy. No definite evidence of right ovarian torsion. Electronically Signed   By: Marijo Conception M.D.   On: 01/13/2022 14:42   CT ABDOMEN PELVIS W CONTRAST  Result Date: 01/13/2022 CLINICAL DATA:  Abdominal pain.  History of endometriosis. EXAM: CT ABDOMEN AND PELVIS WITH CONTRAST TECHNIQUE: Multidetector CT imaging of the abdomen and pelvis was performed using the standard protocol following bolus administration of intravenous contrast. RADIATION DOSE REDUCTION: This exam was performed according to the departmental dose-optimization program which includes automated exposure control, adjustment of the mA and/or kV according to patient size and/or use of iterative reconstruction technique. CONTRAST:  100 mL OMNIPAQUE IOHEXOL 300 MG/ML  SOLN COMPARISON:  09/21/2014 FINDINGS: Lower chest: No acute abnormality. Hepatobiliary: Medial right lobe hypodensity consistent with focal fatty infiltration. Otherwise no hepatic lesions identified and no intrahepatic biliary ductal dilatation. No gallstones, gallbladder wall thickening, or extrahepatic biliary dilatation. Pancreas: Unremarkable. No pancreatic ductal dilatation or surrounding inflammatory changes. Spleen: Normal in size without focal abnormality. Adrenals/Urinary Tract: There are no precontrast images which limits the ability to assess for the presence of stones. Left kidney demonstrates no hydronephrosis. On the right there is hydronephrosis and hydroureter presumably on the basis of complex mass in the pelvis. Urinary bladder is displaced but otherwise unremarkable. Stomach/Bowel: Stomach  is within normal limits. Appendix appears normal. No evidence of bowel wall thickening, distention, or  inflammatory changes. Thickening of the descending and sigmoid colon likely on the basis of normal peristalsis without significant pericolonic inflammatory change identified. Vascular/Lymphatic: No significant vascular findings are present. No enlarged abdominal or pelvic lymph nodes. Reproductive: Uterus is displaced by a right adnexal complex cystic peripherally enhancing multiloculated lesion measuring 13 x 13 x 10 cm. Presumably this represents sequelae of patient's history of endometriosis. Other etiologies like neoplasm or abscess are not excluded on the basis of this examination. Other: No abdominal wall hernia or abnormality. No abdominopelvic ascites. Musculoskeletal: No acute or significant osseous findings. IMPRESSION: 1. Large cystic, multi loculated and peripherally and centrally enhancing process in the right adnexa presumably related to patient's history of endometriosis. Differential would include neoplasm or abscess. 2. Right-sided mild hydronephrosis and hydroureter that appears to be in the basis of mass effect from the right adnexal process. Electronically Signed   By: Sammie Bench M.D.   On: 01/13/2022 14:42   US Pelvis Complete  Result Date: 12/26/2021 CLINICAL DATA:  Right adnexal mass seen in previous CT done earlier today EXAM: TRANSABDOMINAL AND TRANSVAGINAL ULTRASOUND OF PELVIS DOPPLER ULTRASOUND OF OVARIES TECHNIQUE: Both transabdominal and transvaginal ultrasound examinations of the pelvis were performed. Transabdominal technique was performed for global imaging of the pelvis including uterus, ovaries, adnexal regions, and pelvic cul-de-sac. It was necessary to proceed with endovaginal exam following the transabdominal exam to visualize the endometrium and ovaries. Color and duplex Doppler ultrasound was utilized to evaluate blood flow to the ovaries. COMPARISON:  Previous studies including the CT done earlier today and pelvic sonogram done on 05/15/2017 FINDINGS: Uterus  Measurements: 9.5 x 5.5 x 5.8 cm = volume: 157.7 mL. There is slightly inhomogeneous echogenicity in myometrium. Nabothian cysts are seen in cervix. There is possible calcification in the margin of 1 of the nabothian cysts. Endometrium Thickness: 12 mm.  No focal abnormality visualized. Right ovary Measurements: 9.7 x 7.4 x 6.6 cm = volume: 244.9 mL. There is inhomogeneous echogenicity with multiple hypoechoic lesions of varying sizes largest measuring 5 x 6.6 cm. There are other smaller lesions in the right ovary. There is arterial and venous flow within the right ovary in color flow and pulsed Doppler examination. Left ovary Not sonographically visualized consistent with left oophorectomy. Pulsed Doppler evaluation of right ovary demonstrates normal low-resistance arterial and venous waveforms. Other findings No abnormal free fluid. IMPRESSION: There is marked enlargement of right ovary. There are multiple smooth marginated lesions of varying sizes largest measuring 6.6 cm in maximum diameter. Findings suggestive multiple hemorrhagic right ovarian cysts. There is no evidence of right ovarian torsion. Left ovary is surgically absent. There is inhomogeneous echogenicity in myometrium without no discrete focal abnormalities. Electronically Signed   By: Elmer Picker M.D.   On: 12/26/2021 15:04   US Transvaginal Non-OB  Result Date: 12/26/2021 CLINICAL DATA:  Right adnexal mass seen in previous CT done earlier today EXAM: TRANSABDOMINAL AND TRANSVAGINAL ULTRASOUND OF PELVIS DOPPLER ULTRASOUND OF OVARIES TECHNIQUE: Both transabdominal and transvaginal ultrasound examinations of the pelvis were performed. Transabdominal technique was performed for global imaging of the pelvis including uterus, ovaries, adnexal regions, and pelvic cul-de-sac. It was necessary to proceed with endovaginal exam following the transabdominal exam to visualize the endometrium and ovaries. Color and duplex Doppler ultrasound was  utilized to evaluate blood flow to the ovaries. COMPARISON:  Previous studies including the CT done earlier today and pelvic sonogram  done on 05/15/2017 FINDINGS: Uterus Measurements: 9.5 x 5.5 x 5.8 cm = volume: 157.7 mL. There is slightly inhomogeneous echogenicity in myometrium. Nabothian cysts are seen in cervix. There is possible calcification in the margin of 1 of the nabothian cysts. Endometrium Thickness: 12 mm.  No focal abnormality visualized. Right ovary Measurements: 9.7 x 7.4 x 6.6 cm = volume: 244.9 mL. There is inhomogeneous echogenicity with multiple hypoechoic lesions of varying sizes largest measuring 5 x 6.6 cm. There are other smaller lesions in the right ovary. There is arterial and venous flow within the right ovary in color flow and pulsed Doppler examination. Left ovary Not sonographically visualized consistent with left oophorectomy. Pulsed Doppler evaluation of right ovary demonstrates normal low-resistance arterial and venous waveforms. Other findings No abnormal free fluid. IMPRESSION: There is marked enlargement of right ovary. There are multiple smooth marginated lesions of varying sizes largest measuring 6.6 cm in maximum diameter. Findings suggestive multiple hemorrhagic right ovarian cysts. There is no evidence of right ovarian torsion. Left ovary is surgically absent. There is inhomogeneous echogenicity in myometrium without no discrete focal abnormalities. Electronically Signed   By: Elmer Picker M.D.   On: 12/26/2021 15:04   [place] Art/Ven Flow Abd Pelv Doppler  Result Date: 12/26/2021 CLINICAL DATA:  Right adnexal mass seen in previous CT done earlier today EXAM: TRANSABDOMINAL AND TRANSVAGINAL ULTRASOUND OF PELVIS DOPPLER ULTRASOUND OF OVARIES TECHNIQUE: Both transabdominal and transvaginal ultrasound examinations of the pelvis were performed. Transabdominal technique was performed for global imaging of the pelvis including uterus, ovaries, adnexal regions, and pelvic  cul-de-sac. It was necessary to proceed with endovaginal exam following the transabdominal exam to visualize the endometrium and ovaries. Color and duplex Doppler ultrasound was utilized to evaluate blood flow to the ovaries. COMPARISON:  Previous studies including the CT done earlier today and pelvic sonogram done on 05/15/2017 FINDINGS: Uterus Measurements: 9.5 x 5.5 x 5.8 cm = volume: 157.7 mL. There is slightly inhomogeneous echogenicity in myometrium. Nabothian cysts are seen in cervix. There is possible calcification in the margin of 1 of the nabothian cysts. Endometrium Thickness: 12 mm.  No focal abnormality visualized. Right ovary Measurements: 9.7 x 7.4 x 6.6 cm = volume: 244.9 mL. There is inhomogeneous echogenicity with multiple hypoechoic lesions of varying sizes largest measuring 5 x 6.6 cm. There are other smaller lesions in the right ovary. There is arterial and venous flow within the right ovary in color flow and pulsed Doppler examination. Left ovary Not sonographically visualized consistent with left oophorectomy. Pulsed Doppler evaluation of right ovary demonstrates normal low-resistance arterial and venous waveforms. Other findings No abnormal free fluid. IMPRESSION: There is marked enlargement of right ovary. There are multiple smooth marginated lesions of varying sizes largest measuring 6.6 cm in maximum diameter. Findings suggestive multiple hemorrhagic right ovarian cysts. There is no evidence of right ovarian torsion. Left ovary is surgically absent. There is inhomogeneous echogenicity in myometrium without no discrete focal abnormalities. Electronically Signed   By: Elmer Picker M.D.   On: 12/26/2021 15:04   CT ABDOMEN PELVIS W CONTRAST  Result Date: 12/26/2021 CLINICAL DATA:  Right lower quadrant abdominal pain with vomiting and fever for 2 weeks following surgery for endometriosis. EXAM: CT CHEST, ABDOMEN, AND PELVIS WITH CONTRAST TECHNIQUE: Multidetector CT imaging of the  chest, abdomen and pelvis was performed following the standard protocol during bolus administration of intravenous contrast. RADIATION DOSE REDUCTION: This exam was performed according to the departmental dose-optimization program which includes automated exposure control, adjustment  of the mA and/or kV according to patient size and/or use of iterative reconstruction technique. CONTRAST:  176m OMNIPAQUE IOHEXOL 300 MG/ML  SOLN COMPARISON:  Abdominopelvic CT 09/21/2014. Chest radiographs 12/26/2021. FINDINGS: CT CHEST FINDINGS Cardiovascular: No significant vascular findings. The heart size is normal. There is no pericardial effusion. Mediastinum/Nodes: There are no enlarged mediastinal, hilar or axillary lymph nodes. The thyroid gland, trachea and esophagus demonstrate no significant findings. Lungs/Pleura: There is no pleural effusion. The lungs are clear. Musculoskeletal/Chest wall: No chest wall mass or suspicious osseous findings. CT ABDOMEN AND PELVIS FINDINGS Hepatobiliary: The liver is normal in density without suspicious focal abnormality. Focal fat in the left hepatic lobe adjacent to the falciform ligament, similar to previous study. No evidence of gallstones, gallbladder wall thickening or biliary dilatation. Pancreas: Unremarkable. No pancreatic ductal dilatation or surrounding inflammatory changes. Spleen: Normal in size without focal abnormality. Adrenals/Urinary Tract: Both adrenal glands appear normal. No evidence of urinary tract calculus, suspicious renal lesion or hydronephrosis. The bladder appears normal for its degree of distention. Stomach/Bowel: No enteric contrast administered. The stomach appears unremarkable for its degree of distension. No evidence of bowel wall thickening, distention or surrounding inflammatory change. The appendix is not clearly demonstrated. Vascular/Lymphatic: There are multiple new mildly prominent retroperitoneal lymph nodes which are likely reactive. No pelvic  adenopathy. No significant vascular findings. Reproductive: There is a complex, multi-septated cystic right adnexal mass which measures 9.2 x 6.2 x 7.0 cm. This mass is new compared with the previous CT and demonstrates thickened septations. No left adnexal mass or suspicious uterine findings. Small cervical nabothian cysts are noted. Other: Mild pelvic soft tissue edema without focal fluid collection, ascites or free air. Musculoskeletal: No acute or significant osseous findings. IMPRESSION: 1. Complex, multi-septated cystic right adnexal mass measuring up to 9.2 cm in diameter, new compared with previous CT from 2016. Appearance is nonspecific and could reflect an atypical endometrioma or tubo-ovarian abscess. Correlate with recent surgical procedure/findings and presumed outside imaging. Neoplasm not excluded by this examination. Ultrasound may be helpful for further characterization. 2. Mild pelvic soft tissue edema without focal fluid collection, ascites or free air. The appendix is not clearly demonstrated. 3. Mildly prominent retroperitoneal lymph nodes, likely reactive. 4. No acute findings in the chest. Electronically Signed   By: WRichardean SaleM.D.   On: 12/26/2021 13:23   CT Chest W Contrast  Result Date: 12/26/2021 CLINICAL DATA:  Right lower quadrant abdominal pain with vomiting and fever for 2 weeks following surgery for endometriosis. EXAM: CT CHEST, ABDOMEN, AND PELVIS WITH CONTRAST TECHNIQUE: Multidetector CT imaging of the chest, abdomen and pelvis was performed following the standard protocol during bolus administration of intravenous contrast. RADIATION DOSE REDUCTION: This exam was performed according to the departmental dose-optimization program which includes automated exposure control, adjustment of the mA and/or kV according to patient size and/or use of iterative reconstruction technique. CONTRAST:  1049mOMNIPAQUE IOHEXOL 300 MG/ML  SOLN COMPARISON:  Abdominopelvic CT 09/21/2014.  Chest radiographs 12/26/2021. FINDINGS: CT CHEST FINDINGS Cardiovascular: No significant vascular findings. The heart size is normal. There is no pericardial effusion. Mediastinum/Nodes: There are no enlarged mediastinal, hilar or axillary lymph nodes. The thyroid gland, trachea and esophagus demonstrate no significant findings. Lungs/Pleura: There is no pleural effusion. The lungs are clear. Musculoskeletal/Chest wall: No chest wall mass or suspicious osseous findings. CT ABDOMEN AND PELVIS FINDINGS Hepatobiliary: The liver is normal in density without suspicious focal abnormality. Focal fat in the left hepatic lobe adjacent to the falciform  ligament, similar to previous study. No evidence of gallstones, gallbladder wall thickening or biliary dilatation. Pancreas: Unremarkable. No pancreatic ductal dilatation or surrounding inflammatory changes. Spleen: Normal in size without focal abnormality. Adrenals/Urinary Tract: Both adrenal glands appear normal. No evidence of urinary tract calculus, suspicious renal lesion or hydronephrosis. The bladder appears normal for its degree of distention. Stomach/Bowel: No enteric contrast administered. The stomach appears unremarkable for its degree of distension. No evidence of bowel wall thickening, distention or surrounding inflammatory change. The appendix is not clearly demonstrated. Vascular/Lymphatic: There are multiple new mildly prominent retroperitoneal lymph nodes which are likely reactive. No pelvic adenopathy. No significant vascular findings. Reproductive: There is a complex, multi-septated cystic right adnexal mass which measures 9.2 x 6.2 x 7.0 cm. This mass is new compared with the previous CT and demonstrates thickened septations. No left adnexal mass or suspicious uterine findings. Small cervical nabothian cysts are noted. Other: Mild pelvic soft tissue edema without focal fluid collection, ascites or free air. Musculoskeletal: No acute or significant osseous  findings. IMPRESSION: 1. Complex, multi-septated cystic right adnexal mass measuring up to 9.2 cm in diameter, new compared with previous CT from 2016. Appearance is nonspecific and could reflect an atypical endometrioma or tubo-ovarian abscess. Correlate with recent surgical procedure/findings and presumed outside imaging. Neoplasm not excluded by this examination. Ultrasound may be helpful for further characterization. 2. Mild pelvic soft tissue edema without focal fluid collection, ascites or free air. The appendix is not clearly demonstrated. 3. Mildly prominent retroperitoneal lymph nodes, likely reactive. 4. No acute findings in the chest. Electronically Signed   By: Richardean Sale M.D.   On: 12/26/2021 13:23   DG Chest Port 1 View  Result Date: 12/26/2021 CLINICAL DATA:  Questionable sepsis, evaluate for abnormality. Nausea and vomiting. Fevers. Gyn surgery 2 weeks ago. EXAM: PORTABLE CHEST 1 VIEW COMPARISON:  None Available. FINDINGS: The heart size and mediastinal contours are within normal limits. Both lungs are clear. The visualized skeletal structures are unremarkable. IMPRESSION: Negative one-view chest x-ray. Electronically Signed   By: San Morelle M.D.   On: 12/26/2021 11:17    Scheduled Meds: Continuous Infusions:  piperacillin-tazobactam (ZOSYN)  IV 3.375 g (01/14/22 0552)   PRN Meds:ketorolac, morphine injection  Assessment/Plan: Large TOA s/p imaged-guided transvaginal drainage of endometriomas.  Pain controlled w/NSAIDs  ID: Low grade temp/downward trending WBC.  Blood cxs NG Endo: h/o type 2 DM. Hyperglycemia CBG (last 3)  Recent Labs    01/14/22 0744  GLUCAP 153*   FEN: Mild low NA--suspect pseudo hyponatremia in setting of hyperglycemia GI: Elevated LFTs--?2/2 infection  >continue broad spectrum abx >serial labs >sliding scale insulin coverage while NPO >awaiting IR consult re: possible image-guided drainage.  Would avoid a transvaginal approach   LOS:  1 day   Marie Hill ID: [name], female   DOB: [birth year], 41 y.o.   MRN: [id number]

## 2022-01-14 NOTE — Consult Note (Signed)
Chief Complaint: Patient was seen in consultation today for pelvic fluid collection aspiration  Referring Physician(s): Lahoma Crocker, MD  Supervising Physician: Juliet Rude  Patient Status: [facility] - In-pt  History of Present Illness: Marie Hill is a 41 y.o. female with a past medical history of significant for DM, HLD and severe endometriosis s/p left salpingo-oophorectomy and recurrent right ovarian endometrioma who presented to [facility] on 12/1 after being seen by Dr. Berline Lopes in clinic earlier that day due to concern for sepsis. Ms. 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 She underwent left salpingo-oophorectomy with lysis of adhesion and left ureterolysis for retroperitoneal fibrosis obliterating the pararectal space later that same year. She subsequently conceived spontaneously in 2017 and 2019 however unfortunately these pregnancies both resulted in early loss. She underwent IUI in 2019 without conceiving. She underwent 3 cycles of IVF (last 2022) and was seen by Dr. Kerin Perna in August of this year to discuss possible 4th IVF cycle. She underwent US guided aspiration of 24 mm ovarian endometrioma and was seen back in clinic in November. During that clinic visit she reported abdominal pain and US showed a right endometrioma without free fluid in the pelvis, she was prescribed estrogen suppression medications for 2 weeks however about a week after her visit she presented to the ED with sepsis. She was treated with IV antibiotics and eventually was discharged home on 12/30/21 with PO antibiotics. Follow up US in clinic on 11/29 showed a right tubo-ovarian complex adherent to the uterus measuring 10.6 cm. She was referred to Dr. Berline Lopes for follow up of endometriosis and during her visit on 12/1 she reported persistent abdominal pain, poor appetite, weakness,  tachycardia and abdominal pain. She was sent to the ED from Dr. Charisse March office for further evaluation.  On evaluation in the ED she was found to have WBC 18.0 with tachycardia. CT abd/pelvis w/contrast was obtained and showed:  1. Large cystic, multi loculated and peripherally and centrally enhancing process in the right adnexa presumably related to patient's history of endometriosis. Differential would include neoplasm or abscess. 2. Right-sided mild hydronephrosis and hydroureter that appears to be in the basis of mass effect from the right adnexal process.  Transabdominal and transvaginal US showed:  Large heterogeneous but predominantly solid mass is noted in the right adnexal region highly concerning for right ovarian neoplasm or malignancy. MRI is recommended for further evaluation as well as gynecologic consultation.   Status post left oophorectomy.   No definite evidence of right ovarian torsion.  IR has been consulted for aspiration/possible drain placement to tailor antibiotic regimen and fluid removal.   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   no meds 1997, treated in June 2016 x 1 month- states neg chest x ray June 2016    Past Surgical History:  Procedure Laterality Date   LAPAROSCOPIC UNILATERAL SALPINGO OOPHERECTOMY N/A 09/29/2014   Procedure: LAPAROSCOPIC LEFT SALPINGO OOPHORECTOMY ADHESIOLYSIS ;  Surgeon: Everitt Amber, MD;  Location: [facility];  Service: Gynecology;  Laterality: N/A;   ROBOTIC ASSISTED LAPAROSCOPIC  OVARIAN CYSTECTOMY N/A 09/29/2014   Procedure: SI ROBOTIC ASSISTED LAPAROSCOPIC OVARIAN CYSTECTOMY;  Surgeon: Everitt Amber, MD;  Location: [facility];  Service: Gynecology;   Laterality: N/A;   TYMPANOPLASTY Left 08/19/2019   Procedure: LEFT TYMPANOPLASTY;  Surgeon: Rozetta Nunnery, MD;  Location: [place];  Service: ENT;  Laterality: Left;    Allergies: Patient has no known allergies.  Medications: Prior to Admission medications   Medication Sig Start Date End Date Taking? Authorizing Provider  acetaminophen (TYLENOL) 500 MG tablet Take 500 mg by mouth every 6 (six) hours as needed for moderate pain.   Yes [provider]  amoxicillin-clavulanate (AUGMENTIN) 875-125 MG tablet Take 1 tablet by mouth 2 (two) times daily. 12/30/21  Yes Governor Specking, MD  doxycycline (VIBRAMYCIN) 50 MG capsule Take 2 capsules (100 mg total) by mouth 2 (two) times daily. 12/30/21  Yes Governor Specking, MD  empagliflozin (JARDIANCE) 25 MG TABS tablet Take 25 mg by mouth daily.   Yes [provider]  ibuprofen (ADVIL) 200 MG tablet Take 400 mg by mouth every 6 (six) hours as needed for moderate pain.   Yes [provider]  metFORMIN (GLUCOPHAGE-XR) 500 MG 24 hr tablet Take 1,000 mg by mouth 2 (two) times daily with a meal.   Yes [provider]  rosuvastatin (CRESTOR) 40 MG tablet Take 40 mg by mouth daily.   Yes [provider]  traMADol (ULTRAM) 50 MG tablet Take 50 mg by mouth every 6 (six) hours as needed for moderate pain or severe pain. 01/10/22  Yes [provider]     Family History  Problem Relation Age of Onset   Hyperlipidemia Mother    Hypertension Mother    Hyperlipidemia Father     Social History   Socioeconomic History   Marital status: Married    Spouse name: Not on file   Number of children: 0   Years of education: Not on file   Highest education level: Not on file  Occupational History   Occupation: Metallurgist  Tobacco Use   Smoking status: Never   Smokeless tobacco: Never  Vaping Use   Vaping Use: Never used  Substance and Sexual Activity   Alcohol use: Not  Currently    Alcohol/week: 0.0 standard drinks of alcohol   Drug use: No   Sexual activity: Not Currently    Partners: Male    Birth control/protection: None  Other Topics Concern   Not on file  Social History Narrative   Marital status:  Single; together x 8 years.      Children: none      Lives: with boyfriend; from [place]; moved to [place] at age 89.      Employment:  Cytogeneticist.      Tobacco: none      Alcohol: weekends      Drugs: none      Exercise:  Sporadic      Seatbelt: 100%; no texting   Social Determinants of Health   Financial Resource Strain: Not on file  Food Insecurity: No Food Insecurity (01/13/2022)   Hunger Vital Sign    Worried About Running Out of Food in the Last Year: Never true    Ran Out of Food in the Last Year: Never true  Transportation Needs: No Transportation Needs (01/13/2022)   PRAPARE - Hydrologist (Medical): No    Lack of Transportation (Non-Medical): No  Physical Activity: Not on file  Stress: Not on file  Social Connections: Not on file     Review of Systems: A 12 point ROS discussed and pertinent positives are indicated in the HPI above.  All other systems are negative.  Review of Systems  Constitutional:  Positive for appetite change and fatigue. Negative for fever.  Respiratory:  Negative for cough and shortness of breath.   Cardiovascular:  Negative for chest pain.  Gastrointestinal:  Positive for abdominal pain. Negative for diarrhea, nausea and vomiting.  Genitourinary:  Positive for pelvic pain.  Musculoskeletal:  Negative for back pain.  Neurological:  Negative for dizziness and headaches.    Vital Signs: BP 115/75 (BP Location: Left Arm)   Pulse 86   Temp 98.7 F (37.1 C) (Oral)   Resp 14   Ht '5\' 2"'$  (1.575 m)   Wt 115 lb 4 oz (52.3 kg)   LMP 12/28/2021   SpO2 98%   BMI 21.08 kg/m   Physical Exam Vitals and nursing note reviewed.  Constitutional:       General: She is not in acute distress. HENT:     Head: Normocephalic.     Mouth/Throat:     Mouth: Mucous membranes are moist.     Pharynx: Oropharynx is clear. No oropharyngeal exudate or posterior oropharyngeal erythema.  Cardiovascular:     Rate and Rhythm: Normal rate and regular rhythm.  Pulmonary:     Effort: Pulmonary effort is normal.     Breath sounds: Normal breath sounds.  Abdominal:     General: There is distension.     Palpations: Abdomen is soft.     Tenderness: There is abdominal tenderness.  Skin:    General: Skin is warm and dry.  Neurological:     Mental Status: She is alert and oriented to person, place, and time.  Psychiatric:        Mood and Affect: Mood normal.        Behavior: Behavior normal.        Thought Content: Thought content normal.        Judgment: Judgment normal.      MD Evaluation Airway: WNL Heart: WNL Abdomen: WNL Chest/ Lungs: WNL ASA  Classification: 2 Mallampati/Airway Score: One   Imaging: US Pelvis Complete  Result Date: 01/13/2022 CLINICAL DATA:  Pelvic pain. EXAM: TRANSABDOMINAL AND TRANSVAGINAL ULTRASOUND OF PELVIS DOPPLER ULTRASOUND OF OVARIES TECHNIQUE: Both transabdominal and transvaginal ultrasound examinations of the pelvis were performed. Transabdominal technique was performed for global imaging of the pelvis including uterus, ovaries, adnexal regions, and pelvic cul-de-sac. It was necessary to proceed with endovaginal exam following the transabdominal exam to visualize the endometrium and ovaries. Color and duplex Doppler ultrasound was utilized to evaluate blood flow to the ovaries. COMPARISON:  CT scan of same day.  Ultrasound of December 26, 2021. FINDINGS: Uterus Measurements: 8.2 x 5.7 x 3.2 cm = volume: 78 mL. No fibroids or other mass visualized. Endometrium Not well visualized due to large right adnexal mass. Right ovary Measurements: 8.5 x 8.5 x 7.6 cm = volume: 287 mL. Large heterogeneous but predominantly solid  mass is noted highly concerning for ovarian malignancy or neoplasm. Status post left oophorectomy. Pulsed Doppler evaluation of right ovary demonstrates normal low-resistance arterial and venous waveforms. Other findings No abnormal free fluid. IMPRESSION: Large heterogeneous but predominantly solid mass is noted in the right adnexal region highly concerning for right ovarian neoplasm or malignancy. MRI is recommended for further evaluation as well as gynecologic consultation. Status post left oophorectomy.  No definite evidence of right ovarian torsion. Electronically Signed   By: Marijo Conception M.D.   On: 01/13/2022 14:42   US Transvaginal Non-OB  Result Date: 01/13/2022 CLINICAL DATA:  Pelvic pain. EXAM: TRANSABDOMINAL AND TRANSVAGINAL ULTRASOUND OF PELVIS DOPPLER ULTRASOUND OF OVARIES TECHNIQUE: Both transabdominal and transvaginal ultrasound examinations of the pelvis were performed. Transabdominal technique was performed for global imaging of the pelvis including uterus, ovaries, adnexal regions, and pelvic cul-de-sac. It was necessary to proceed with endovaginal exam following the transabdominal exam to visualize the endometrium and ovaries. Color and duplex Doppler ultrasound was utilized to evaluate blood flow to the ovaries. COMPARISON:  CT scan of same day.  Ultrasound of December 26, 2021. FINDINGS: Uterus Measurements: 8.2 x 5.7 x 3.2 cm = volume: 78 mL. No fibroids or other mass visualized. Endometrium Not well visualized due to large right adnexal mass. Right ovary Measurements: 8.5 x 8.5 x 7.6 cm = volume: 287 mL. Large heterogeneous but predominantly solid mass is noted highly concerning for ovarian malignancy or neoplasm. Status post left oophorectomy. Pulsed Doppler evaluation of right ovary demonstrates normal low-resistance arterial and venous waveforms. Other findings No abnormal free fluid. IMPRESSION: Large heterogeneous but predominantly solid mass is noted in the right adnexal region  highly concerning for right ovarian neoplasm or malignancy. MRI is recommended for further evaluation as well as gynecologic consultation. Status post left oophorectomy. No definite evidence of right ovarian torsion. Electronically Signed   By: Marijo Conception M.D.   On: 01/13/2022 14:42   [place] Art/Ven Flow Abd Pelv Doppler  Result Date: 01/13/2022 CLINICAL DATA:  Pelvic pain. EXAM: TRANSABDOMINAL AND TRANSVAGINAL ULTRASOUND OF PELVIS DOPPLER ULTRASOUND OF OVARIES TECHNIQUE: Both transabdominal and transvaginal ultrasound examinations of the pelvis were performed. Transabdominal technique was performed for global imaging of the pelvis including uterus, ovaries, adnexal regions, and pelvic cul-de-sac. It was necessary to proceed with endovaginal exam following the transabdominal exam to visualize the endometrium and ovaries. Color and duplex Doppler ultrasound was utilized to evaluate blood flow to the ovaries. COMPARISON:  CT scan of same day.  Ultrasound of December 26, 2021. FINDINGS: Uterus Measurements: 8.2 x 5.7 x 3.2 cm = volume: 78 mL. No fibroids or other mass visualized. Endometrium Not well visualized due to large right adnexal mass. Right ovary Measurements: 8.5 x 8.5 x 7.6 cm = volume: 287 mL. Large heterogeneous but predominantly solid mass is noted highly concerning for ovarian malignancy or neoplasm. Status post left oophorectomy. Pulsed Doppler evaluation of right ovary demonstrates normal low-resistance arterial and venous waveforms. Other findings No abnormal free fluid. IMPRESSION: Large heterogeneous but predominantly solid mass is noted in the right adnexal region highly concerning for right ovarian neoplasm or malignancy. MRI is recommended for further evaluation as well as gynecologic consultation. Status post left oophorectomy. No definite evidence of right ovarian torsion. Electronically Signed   By: Marijo Conception M.D.   On: 01/13/2022 14:42   CT ABDOMEN PELVIS W CONTRAST  Result  Date: 01/13/2022 CLINICAL DATA:  Abdominal pain.  History of endometriosis. EXAM: CT ABDOMEN AND PELVIS WITH CONTRAST TECHNIQUE: Multidetector CT imaging of the abdomen and pelvis was performed using the standard protocol following bolus administration of intravenous contrast. RADIATION DOSE REDUCTION: This exam was performed according to the departmental dose-optimization program which includes automated exposure control, adjustment of the mA and/or kV according to patient size and/or use of iterative reconstruction technique. CONTRAST:  100 mL OMNIPAQUE IOHEXOL 300 MG/ML  SOLN COMPARISON:  09/21/2014  FINDINGS: Lower chest: No acute abnormality. Hepatobiliary: Medial right lobe hypodensity consistent with focal fatty infiltration. Otherwise no hepatic lesions identified and no intrahepatic biliary ductal dilatation. No gallstones, gallbladder wall thickening, or extrahepatic biliary dilatation. Pancreas: Unremarkable. No pancreatic ductal dilatation or surrounding inflammatory changes. Spleen: Normal in size without focal abnormality. Adrenals/Urinary Tract: There are no precontrast images which limits the ability to assess for the presence of stones. Left kidney demonstrates no hydronephrosis. On the right there is hydronephrosis and hydroureter presumably on the basis of complex mass in the pelvis. Urinary bladder is displaced but otherwise unremarkable. Stomach/Bowel: Stomach is within normal limits. Appendix appears normal. No evidence of bowel wall thickening, distention, or inflammatory changes. Thickening of the descending and sigmoid colon likely on the basis of normal peristalsis without significant pericolonic inflammatory change identified. Vascular/Lymphatic: No significant vascular findings are present. No enlarged abdominal or pelvic lymph nodes. Reproductive: Uterus is displaced by a right adnexal complex cystic peripherally enhancing multiloculated lesion measuring 13 x 13 x 10 cm. Presumably this  represents sequelae of patient's history of endometriosis. Other etiologies like neoplasm or abscess are not excluded on the basis of this examination. Other: No abdominal wall hernia or abnormality. No abdominopelvic ascites. Musculoskeletal: No acute or significant osseous findings. IMPRESSION: 1. Large cystic, multi loculated and peripherally and centrally enhancing process in the right adnexa presumably related to patient's history of endometriosis. Differential would include neoplasm or abscess. 2. Right-sided mild hydronephrosis and hydroureter that appears to be in the basis of mass effect from the right adnexal process. Electronically Signed   By: Sammie Bench M.D.   On: 01/13/2022 14:42   US Pelvis Complete  Result Date: 12/26/2021 CLINICAL DATA:  Right adnexal mass seen in previous CT done earlier today EXAM: TRANSABDOMINAL AND TRANSVAGINAL ULTRASOUND OF PELVIS DOPPLER ULTRASOUND OF OVARIES TECHNIQUE: Both transabdominal and transvaginal ultrasound examinations of the pelvis were performed. Transabdominal technique was performed for global imaging of the pelvis including uterus, ovaries, adnexal regions, and pelvic cul-de-sac. It was necessary to proceed with endovaginal exam following the transabdominal exam to visualize the endometrium and ovaries. Color and duplex Doppler ultrasound was utilized to evaluate blood flow to the ovaries. COMPARISON:  Previous studies including the CT done earlier today and pelvic sonogram done on 05/15/2017 FINDINGS: Uterus Measurements: 9.5 x 5.5 x 5.8 cm = volume: 157.7 mL. There is slightly inhomogeneous echogenicity in myometrium. Nabothian cysts are seen in cervix. There is possible calcification in the margin of 1 of the nabothian cysts. Endometrium Thickness: 12 mm.  No focal abnormality visualized. Right ovary Measurements: 9.7 x 7.4 x 6.6 cm = volume: 244.9 mL. There is inhomogeneous echogenicity with multiple hypoechoic lesions of varying sizes largest  measuring 5 x 6.6 cm. There are other smaller lesions in the right ovary. There is arterial and venous flow within the right ovary in color flow and pulsed Doppler examination. Left ovary Not sonographically visualized consistent with left oophorectomy. Pulsed Doppler evaluation of right ovary demonstrates normal low-resistance arterial and venous waveforms. Other findings No abnormal free fluid. IMPRESSION: There is marked enlargement of right ovary. There are multiple smooth marginated lesions of varying sizes largest measuring 6.6 cm in maximum diameter. Findings suggestive multiple hemorrhagic right ovarian cysts. There is no evidence of right ovarian torsion. Left ovary is surgically absent. There is inhomogeneous echogenicity in myometrium without no discrete focal abnormalities. Electronically Signed   By: Elmer Picker M.D.   On: 12/26/2021 15:04   US Transvaginal Non-OB  Result Date: 12/26/2021 CLINICAL DATA:  Right adnexal mass seen in previous CT done earlier today EXAM: TRANSABDOMINAL AND TRANSVAGINAL ULTRASOUND OF PELVIS DOPPLER ULTRASOUND OF OVARIES TECHNIQUE: Both transabdominal and transvaginal ultrasound examinations of the pelvis were performed. Transabdominal technique was performed for global imaging of the pelvis including uterus, ovaries, adnexal regions, and pelvic cul-de-sac. It was necessary to proceed with endovaginal exam following the transabdominal exam to visualize the endometrium and ovaries. Color and duplex Doppler ultrasound was utilized to evaluate blood flow to the ovaries. COMPARISON:  Previous studies including the CT done earlier today and pelvic sonogram done on 05/15/2017 FINDINGS: Uterus Measurements: 9.5 x 5.5 x 5.8 cm = volume: 157.7 mL. There is slightly inhomogeneous echogenicity in myometrium. Nabothian cysts are seen in cervix. There is possible calcification in the margin of 1 of the nabothian cysts. Endometrium Thickness: 12 mm.  No focal abnormality  visualized. Right ovary Measurements: 9.7 x 7.4 x 6.6 cm = volume: 244.9 mL. There is inhomogeneous echogenicity with multiple hypoechoic lesions of varying sizes largest measuring 5 x 6.6 cm. There are other smaller lesions in the right ovary. There is arterial and venous flow within the right ovary in color flow and pulsed Doppler examination. Left ovary Not sonographically visualized consistent with left oophorectomy. Pulsed Doppler evaluation of right ovary demonstrates normal low-resistance arterial and venous waveforms. Other findings No abnormal free fluid. IMPRESSION: There is marked enlargement of right ovary. There are multiple smooth marginated lesions of varying sizes largest measuring 6.6 cm in maximum diameter. Findings suggestive multiple hemorrhagic right ovarian cysts. There is no evidence of right ovarian torsion. Left ovary is surgically absent. There is inhomogeneous echogenicity in myometrium without no discrete focal abnormalities. Electronically Signed   By: Elmer Picker M.D.   On: 12/26/2021 15:04   [place] Art/Ven Flow Abd Pelv Doppler  Result Date: 12/26/2021 CLINICAL DATA:  Right adnexal mass seen in previous CT done earlier today EXAM: TRANSABDOMINAL AND TRANSVAGINAL ULTRASOUND OF PELVIS DOPPLER ULTRASOUND OF OVARIES TECHNIQUE: Both transabdominal and transvaginal ultrasound examinations of the pelvis were performed. Transabdominal technique was performed for global imaging of the pelvis including uterus, ovaries, adnexal regions, and pelvic cul-de-sac. It was necessary to proceed with endovaginal exam following the transabdominal exam to visualize the endometrium and ovaries. Color and duplex Doppler ultrasound was utilized to evaluate blood flow to the ovaries. COMPARISON:  Previous studies including the CT done earlier today and pelvic sonogram done on 05/15/2017 FINDINGS: Uterus Measurements: 9.5 x 5.5 x 5.8 cm = volume: 157.7 mL. There is slightly inhomogeneous echogenicity in  myometrium. Nabothian cysts are seen in cervix. There is possible calcification in the margin of 1 of the nabothian cysts. Endometrium Thickness: 12 mm.  No focal abnormality visualized. Right ovary Measurements: 9.7 x 7.4 x 6.6 cm = volume: 244.9 mL. There is inhomogeneous echogenicity with multiple hypoechoic lesions of varying sizes largest measuring 5 x 6.6 cm. There are other smaller lesions in the right ovary. There is arterial and venous flow within the right ovary in color flow and pulsed Doppler examination. Left ovary Not sonographically visualized consistent with left oophorectomy. Pulsed Doppler evaluation of right ovary demonstrates normal low-resistance arterial and venous waveforms. Other findings No abnormal free fluid. IMPRESSION: There is marked enlargement of right ovary. There are multiple smooth marginated lesions of varying sizes largest measuring 6.6 cm in maximum diameter. Findings suggestive multiple hemorrhagic right ovarian cysts. There is no evidence of right ovarian torsion. Left ovary is surgically absent. There is  inhomogeneous echogenicity in myometrium without no discrete focal abnormalities. Electronically Signed   By: Elmer Picker M.D.   On: 12/26/2021 15:04   CT ABDOMEN PELVIS W CONTRAST  Result Date: 12/26/2021 CLINICAL DATA:  Right lower quadrant abdominal pain with vomiting and fever for 2 weeks following surgery for endometriosis. EXAM: CT CHEST, ABDOMEN, AND PELVIS WITH CONTRAST TECHNIQUE: Multidetector CT imaging of the chest, abdomen and pelvis was performed following the standard protocol during bolus administration of intravenous contrast. RADIATION DOSE REDUCTION: This exam was performed according to the departmental dose-optimization program which includes automated exposure control, adjustment of the mA and/or kV according to patient size and/or use of iterative reconstruction technique. CONTRAST:  161m OMNIPAQUE IOHEXOL 300 MG/ML  SOLN COMPARISON:   Abdominopelvic CT 09/21/2014. Chest radiographs 12/26/2021. FINDINGS: CT CHEST FINDINGS Cardiovascular: No significant vascular findings. The heart size is normal. There is no pericardial effusion. Mediastinum/Nodes: There are no enlarged mediastinal, hilar or axillary lymph nodes. The thyroid gland, trachea and esophagus demonstrate no significant findings. Lungs/Pleura: There is no pleural effusion. The lungs are clear. Musculoskeletal/Chest wall: No chest wall mass or suspicious osseous findings. CT ABDOMEN AND PELVIS FINDINGS Hepatobiliary: The liver is normal in density without suspicious focal abnormality. Focal fat in the left hepatic lobe adjacent to the falciform ligament, similar to previous study. No evidence of gallstones, gallbladder wall thickening or biliary dilatation. Pancreas: Unremarkable. No pancreatic ductal dilatation or surrounding inflammatory changes. Spleen: Normal in size without focal abnormality. Adrenals/Urinary Tract: Both adrenal glands appear normal. No evidence of urinary tract calculus, suspicious renal lesion or hydronephrosis. The bladder appears normal for its degree of distention. Stomach/Bowel: No enteric contrast administered. The stomach appears unremarkable for its degree of distension. No evidence of bowel wall thickening, distention or surrounding inflammatory change. The appendix is not clearly demonstrated. Vascular/Lymphatic: There are multiple new mildly prominent retroperitoneal lymph nodes which are likely reactive. No pelvic adenopathy. No significant vascular findings. Reproductive: There is a complex, multi-septated cystic right adnexal mass which measures 9.2 x 6.2 x 7.0 cm. This mass is new compared with the previous CT and demonstrates thickened septations. No left adnexal mass or suspicious uterine findings. Small cervical nabothian cysts are noted. Other: Mild pelvic soft tissue edema without focal fluid collection, ascites or free air. Musculoskeletal: No  acute or significant osseous findings. IMPRESSION: 1. Complex, multi-septated cystic right adnexal mass measuring up to 9.2 cm in diameter, new compared with previous CT from 2016. Appearance is nonspecific and could reflect an atypical endometrioma or tubo-ovarian abscess. Correlate with recent surgical procedure/findings and presumed outside imaging. Neoplasm not excluded by this examination. Ultrasound may be helpful for further characterization. 2. Mild pelvic soft tissue edema without focal fluid collection, ascites or free air. The appendix is not clearly demonstrated. 3. Mildly prominent retroperitoneal lymph nodes, likely reactive. 4. No acute findings in the chest. Electronically Signed   By: WRichardean SaleM.D.   On: 12/26/2021 13:23   CT Chest W Contrast  Result Date: 12/26/2021 CLINICAL DATA:  Right lower quadrant abdominal pain with vomiting and fever for 2 weeks following surgery for endometriosis. EXAM: CT CHEST, ABDOMEN, AND PELVIS WITH CONTRAST TECHNIQUE: Multidetector CT imaging of the chest, abdomen and pelvis was performed following the standard protocol during bolus administration of intravenous contrast. RADIATION DOSE REDUCTION: This exam was performed according to the departmental dose-optimization program which includes automated exposure control, adjustment of the mA and/or kV according to patient size and/or use of iterative reconstruction technique. CONTRAST:  125m OMNIPAQUE IOHEXOL 300 MG/ML  SOLN COMPARISON:  Abdominopelvic CT 09/21/2014. Chest radiographs 12/26/2021. FINDINGS: CT CHEST FINDINGS Cardiovascular: No significant vascular findings. The heart size is normal. There is no pericardial effusion. Mediastinum/Nodes: There are no enlarged mediastinal, hilar or axillary lymph nodes. The thyroid gland, trachea and esophagus demonstrate no significant findings. Lungs/Pleura: There is no pleural effusion. The lungs are clear. Musculoskeletal/Chest wall: No chest wall mass or  suspicious osseous findings. CT ABDOMEN AND PELVIS FINDINGS Hepatobiliary: The liver is normal in density without suspicious focal abnormality. Focal fat in the left hepatic lobe adjacent to the falciform ligament, similar to previous study. No evidence of gallstones, gallbladder wall thickening or biliary dilatation. Pancreas: Unremarkable. No pancreatic ductal dilatation or surrounding inflammatory changes. Spleen: Normal in size without focal abnormality. Adrenals/Urinary Tract: Both adrenal glands appear normal. No evidence of urinary tract calculus, suspicious renal lesion or hydronephrosis. The bladder appears normal for its degree of distention. Stomach/Bowel: No enteric contrast administered. The stomach appears unremarkable for its degree of distension. No evidence of bowel wall thickening, distention or surrounding inflammatory change. The appendix is not clearly demonstrated. Vascular/Lymphatic: There are multiple new mildly prominent retroperitoneal lymph nodes which are likely reactive. No pelvic adenopathy. No significant vascular findings. Reproductive: There is a complex, multi-septated cystic right adnexal mass which measures 9.2 x 6.2 x 7.0 cm. This mass is new compared with the previous CT and demonstrates thickened septations. No left adnexal mass or suspicious uterine findings. Small cervical nabothian cysts are noted. Other: Mild pelvic soft tissue edema without focal fluid collection, ascites or free air. Musculoskeletal: No acute or significant osseous findings. IMPRESSION: 1. Complex, multi-septated cystic right adnexal mass measuring up to 9.2 cm in diameter, new compared with previous CT from 2016. Appearance is nonspecific and could reflect an atypical endometrioma or tubo-ovarian abscess. Correlate with recent surgical procedure/findings and presumed outside imaging. Neoplasm not excluded by this examination. Ultrasound may be helpful for further characterization. 2. Mild pelvic soft  tissue edema without focal fluid collection, ascites or free air. The appendix is not clearly demonstrated. 3. Mildly prominent retroperitoneal lymph nodes, likely reactive. 4. No acute findings in the chest. Electronically Signed   By: WRichardean SaleM.D.   On: 12/26/2021 13:23   DG Chest Port 1 View  Result Date: 12/26/2021 CLINICAL DATA:  Questionable sepsis, evaluate for abnormality. Nausea and vomiting. Fevers. Gyn surgery 2 weeks ago. EXAM: PORTABLE CHEST 1 VIEW COMPARISON:  None Available. FINDINGS: The heart size and mediastinal contours are within normal limits. Both lungs are clear. The visualized skeletal structures are unremarkable. IMPRESSION: Negative one-view chest x-ray. Electronically Signed   By: CSan MorelleM.D.   On: 12/26/2021 11:17    Labs:  CBC: Recent Labs    12/29/21 0512 12/30/21 0525 12/31/21 0531 01/13/22 0921  WBC 21.0* 21.6* 23.3* 18.0*  HGB 9.8* 9.7* 9.0* 10.1*  HCT 30.6* 29.5* 27.3* 31.5*  PLT 388 420* 442* 671*    COAGS: Recent Labs    12/26/21 1058  INR 1.4*  APTT 33    BMP: Recent Labs    12/26/21 1058 12/28/21 1308 01/13/22 0921  NA 137 138 131*  K 3.8 3.0* 4.1  CL 103 110 94*  CO2 17* 15* 27  GLUCOSE 166* 87 277*  BUN '11 7 10  '$ CALCIUM 8.2* 8.3* 11.2*  CREATININE 0.73 0.72 0.62  GFRNONAA >60 >60 >60    LIVER FUNCTION TESTS: Recent Labs    12/26/21 1058 01/13/22 0921  BILITOT  1.4* 0.7  AST 41 70*  ALT 59* 97*  ALKPHOS 347* 810*  PROT 7.3 10.0*  ALBUMIN 2.4* 3.8    TUMOR MARKERS: No results for input(s): "AFPTM", "CEA", "CA199", "CHROMGRNA" in the last 8760 hours.  Assessment and Plan:  41 y/o F with history of severe endometriosis s/p endometrioma aspiration in November of this year who has had persistent abdominal pain, fever and tachycardia. She was admitted for sepsis 11/13 and treated with IV abx before d/c home on 11/17 with PO abx. She has failed to improve on this regimen and was referred back to the  ED after visit with Dr. Berline Lopes on 12/1. Imaging in the ED showed a large cystic multiloculated process in the right adnexa concerning for TOA. IR has been consulted for percutaneous aspiration/possible drain placement -- patient history and imaging reviewed by Dr. Denna Haggard who is agreeable to proceed. Discussed with patient that this procedure will likely just be an aspiration however she is agreeable to drain placement if indicated at time of procedure.  Plan for aspiration/possible drain placement today in CT with moderate sedation. Patient has been NPO since midnight - continue NPO until post procedure.  Risks and benefits discussed with the patient including bleeding, infection, damage to adjacent structures, bowel/bladder perforation and sepsis.  All of the patient's questions were answered, patient is agreeable to proceed.  Consent signed and in chart.  Thank you for this interesting consult.  I greatly enjoyed meeting Marie Hill and look forward to participating in their care.  A copy of this report was sent to the requesting provider on this date.  Electronically Signed: Joaquim Nam, PA-C 01/14/2022, 12:03 PM   I spent a total of 40 Minutes in face to face in clinical consultation, greater than 50% of which was counseling/coordinating care for pelvic fluid aspiration/possible drain.

## 2022-01-14 NOTE — H&P (Signed)
Marie Hill is an 41 y.o. female who was referred for admission to the emergency department by Dr. Berline Lopes with abdominal pain and tachycardia due to recurrence of a tubo-ovarian complex, following my referral of the patient to GYN oncology for a definitive procedure such as bilateral salpingo-oophorectomy or hysterectomy with bilateral salpingo-oophorectomy. 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   I performed a transvaginal ultrasound guided drainage of left adnexal and right ovarian endometriomas x2 under prophylaxis with doxycycline 200 mg p.o. 6 weeks ago.  Patient has been on [place] and letrozole for suppression of endometriosis and she had continued doxycycline for 5 days post procedure.  She was recently seen in my office with recurrence of pain and ultrasound findings showed slight enlargement in the right ovarian endometrioma.  She was treated with endometriosis suppressing medication such as letrozole and norethindrone acetate since she had run out of [place] and had insurance issues for reauthorization.  She was admitted to [facility] on 12/26/2021 for 4 days of IV antibiotics.  Blood cultures remain negative.  Infectious disease consultation was obtained.  The recommendation was to discharge her on Augmentin and doxycycline with which the patient has been compliant.  We had personally arranged for patient to be seen by GYN oncology for evaluation for a definitive procedure during her outpatient follow-up. Over the last several weeks patient and her family have lost interest in procreation and the patient is requesting extirpative surgery She denies vaginal bleeding, nausea or vomiting.  Patient's last menstrual period was 12/28/2021.    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   no meds 1997, treated in June 2016 x 1 month- states neg chest x ray June 2016    Past Surgical History:  Procedure Laterality Date   LAPAROSCOPIC UNILATERAL SALPINGO OOPHERECTOMY N/A 09/29/2014   Procedure: LAPAROSCOPIC LEFT SALPINGO OOPHORECTOMY ADHESIOLYSIS ;  Surgeon: Everitt Amber, MD;  Location: [facility];  Service: Gynecology;  Laterality: N/A;   ROBOTIC ASSISTED LAPAROSCOPIC OVARIAN CYSTECTOMY N/A 09/29/2014   Procedure: SI ROBOTIC ASSISTED LAPAROSCOPIC OVARIAN CYSTECTOMY;  Surgeon: Everitt Amber, MD;  Location: [facility];  Service: Gynecology;  Laterality: N/A;   TYMPANOPLASTY Left 08/19/2019   Procedure: LEFT TYMPANOPLASTY;  Surgeon: Rozetta Nunnery, MD;  Location: [facility];  Service: ENT;  Laterality: Left;    Family History  Problem Relation Age of Onset   Hyperlipidemia Mother    Hypertension Mother    Hyperlipidemia Father     Social History:  reports that she has never smoked. She has never used smokeless tobacco. She reports that she does not currently use alcohol. She reports that she does not use drugs.  Allergies: No Known Allergies  Medications Prior to Admission  Medication Sig Dispense Refill Last Dose   acetaminophen (TYLENOL) 500 MG tablet Take 500 mg by mouth every 6 (six) hours as needed for moderate pain.   unknown   amoxicillin-clavulanate (AUGMENTIN) 875-125 MG tablet Take 1 tablet by mouth 2 (two) times daily. 20 tablet 1  01/13/2022   doxycycline (VIBRAMYCIN) 50 MG capsule Take 2 capsules (100 mg total) by mouth 2 (two) times daily. 20 capsule 1 01/13/2022   empagliflozin (JARDIANCE) 25 MG TABS tablet Take 25 mg by mouth daily.   Past Month   ibuprofen (ADVIL) 200 MG tablet Take 400 mg by mouth  every 6 (six) hours as needed for moderate pain.   01/12/2022   metFORMIN (GLUCOPHAGE-XR) 500 MG 24 hr tablet Take 1,000 mg by mouth 2 (two) times daily with a meal.   Past Month   rosuvastatin (CRESTOR) 40 MG tablet Take 40 mg by mouth daily.   Past Month   traMADol (ULTRAM) 50 MG tablet Take 50 mg by mouth every 6 (six) hours as needed for moderate pain or severe pain.   01/12/2022    Review of Systems: See HPI  Physical Exam:  Well-developed well-nourished female in no acute distress, lying comfortably in bed after Toradol intravenous injection  Blood pressure 114/72, pulse 96, temperature 99.4 F (37.4 C), temperature source Oral, resp. rate 16, height '5\' 2"'$  (1.575 m), weight 52.3 kg, last menstrual period 12/28/2021, SpO2 98 %.  Abdomen: Soft, slight tenderness in all 4 quadrants to deep palpation.  No hepatosplenomegaly.  No rebound tenderness  Results for orders placed or performed during the hospital encounter of 01/13/22 (from the past 24 hour(s))  Glucose, capillary     Status: Abnormal   Collection Time: 01/14/22  7:44 AM  Result Value Ref Range   Glucose-Capillary 153 (H) 70 - 99 mg/dL  Glucose, capillary     Status: Abnormal   Collection Time: 01/14/22 11:50 AM  Result Value Ref Range   Glucose-Capillary 115 (H) 70 - 99 mg/dL  CBC with Differential     Status: Abnormal   Collection Time: 01/14/22 12:36 PM  Result Value Ref Range   WBC 20.0 (H) 4.0 - 10.5 K/uL   RBC 3.47 (L) 3.87 - 5.11 MIL/uL   Hemoglobin 8.4 (L) 12.0 - 15.0 g/dL   HCT 28.5 (L) 36.0 - 46.0 %   MCV 82.1 80.0 - 100.0 fL   MCH 24.2 (L) 26.0 - 34.0 pg   MCHC 29.5 (L) 30.0 - 36.0 g/dL   RDW 15.2 11.5 - 15.5 %   Platelets 583 (H) 150 - 400 K/uL   nRBC 0.0 0.0 - 0.2 %   Neutrophils Relative % 77 %   Neutro Abs 15.2 (H) 1.7 - 7.7 K/uL   Lymphocytes Relative 14 %   Lymphs Abs 2.9 0.7 - 4.0 K/uL   Monocytes Relative 7 %   Monocytes Absolute 1.3 (H) 0.1 - 1.0 K/uL   Eosinophils Relative 0 %    Eosinophils Absolute 0.1 0.0 - 0.5 K/uL   Basophils Relative 0 %   Basophils Absolute 0.1 0.0 - 0.1 K/uL   Immature Granulocytes 2 %   Abs Immature Granulocytes 0.46 (H) 0.00 - 0.07 K/uL  Comprehensive metabolic panel     Status: Abnormal   Collection Time: 01/14/22 12:36 PM  Result Value Ref Range   Sodium 139 135 - 145 mmol/L   Potassium 3.6 3.5 - 5.1 mmol/L   Chloride 107 98 - 111 mmol/L   CO2 21 (L) 22 - 32 mmol/L   Glucose, Bld 121 (H) 70 - 99 mg/dL   BUN 9 6 - 20 mg/dL   Creatinine, Ser 0.68 0.44 - 1.00 mg/dL   Calcium 9.0 8.9 - 10.3 mg/dL   Total Protein 7.7 6.5 - 8.1 g/dL   Albumin  2.6 (L) 3.5 - 5.0 g/dL   AST 23 15 - 41 U/L   ALT 57 (H) 0 - 44 U/L   Alkaline Phosphatase 518 (H) 38 - 126 U/L   Total Bilirubin 0.6 0.3 - 1.2 mg/dL   GFR, Estimated >60 >60 mL/min   Anion gap 11 5 - 15    US Pelvis Complete  Result Date: 01/13/2022 CLINICAL DATA:  Pelvic pain. EXAM: TRANSABDOMINAL AND TRANSVAGINAL ULTRASOUND OF PELVIS DOPPLER ULTRASOUND OF OVARIES TECHNIQUE: Both transabdominal and transvaginal ultrasound examinations of the pelvis were performed. Transabdominal technique was performed for global imaging of the pelvis including uterus, ovaries, adnexal regions, and pelvic cul-de-sac. It was necessary to proceed with endovaginal exam following the transabdominal exam to visualize the endometrium and ovaries. Color and duplex Doppler ultrasound was utilized to evaluate blood flow to the ovaries. COMPARISON:  CT scan of same day.  Ultrasound of December 26, 2021. FINDINGS: Uterus Measurements: 8.2 x 5.7 x 3.2 cm = volume: 78 mL. No fibroids or other mass visualized. Endometrium Not well visualized due to large right adnexal mass. Right ovary Measurements: 8.5 x 8.5 x 7.6 cm = volume: 287 mL. Large heterogeneous but predominantly solid mass is noted highly concerning for ovarian malignancy or neoplasm. Status post left oophorectomy. Pulsed Doppler evaluation of right ovary demonstrates  normal low-resistance arterial and venous waveforms. Other findings No abnormal free fluid. IMPRESSION: Large heterogeneous but predominantly solid mass is noted in the right adnexal region highly concerning for right ovarian neoplasm or malignancy. MRI is recommended for further evaluation as well as gynecologic consultation. Status post left oophorectomy. No definite evidence of right ovarian torsion. Electronically Signed   By: Marijo Conception M.D.   On: 01/13/2022 14:42   US Transvaginal Non-OB  Result Date: 01/13/2022 CLINICAL DATA:  Pelvic pain. EXAM: TRANSABDOMINAL AND TRANSVAGINAL ULTRASOUND OF PELVIS DOPPLER ULTRASOUND OF OVARIES TECHNIQUE: Both transabdominal and transvaginal ultrasound examinations of the pelvis were performed. Transabdominal technique was performed for global imaging of the pelvis including uterus, ovaries, adnexal regions, and pelvic cul-de-sac. It was necessary to proceed with endovaginal exam following the transabdominal exam to visualize the endometrium and ovaries. Color and duplex Doppler ultrasound was utilized to evaluate blood flow to the ovaries. COMPARISON:  CT scan of same day.  Ultrasound of December 26, 2021. FINDINGS: Uterus Measurements: 8.2 x 5.7 x 3.2 cm = volume: 78 mL. No fibroids or other mass visualized. Endometrium Not well visualized due to large right adnexal mass. Right ovary Measurements: 8.5 x 8.5 x 7.6 cm = volume: 287 mL. Large heterogeneous but predominantly solid mass is noted highly concerning for ovarian malignancy or neoplasm. Status post left oophorectomy. Pulsed Doppler evaluation of right ovary demonstrates normal low-resistance arterial and venous waveforms. Other findings No abnormal free fluid. IMPRESSION: Large heterogeneous but predominantly solid mass is noted in the right adnexal region highly concerning for right ovarian neoplasm or malignancy. MRI is recommended for further evaluation as well as gynecologic consultation. Status post left  oophorectomy. No definite evidence of right ovarian torsion. Electronically Signed   By: Marijo Conception M.D.   On: 01/13/2022 14:42   [place] Art/Ven Flow Abd Pelv Doppler  Result Date: 01/13/2022 CLINICAL DATA:  Pelvic pain. EXAM: TRANSABDOMINAL AND TRANSVAGINAL ULTRASOUND OF PELVIS DOPPLER ULTRASOUND OF OVARIES TECHNIQUE: Both transabdominal and transvaginal ultrasound examinations of the pelvis were performed. Transabdominal technique was performed for global imaging of the pelvis including uterus, ovaries, adnexal regions, and pelvic cul-de-sac. It was necessary to  proceed with endovaginal exam following the transabdominal exam to visualize the endometrium and ovaries. Color and duplex Doppler ultrasound was utilized to evaluate blood flow to the ovaries. COMPARISON:  CT scan of same day.  Ultrasound of December 26, 2021. FINDINGS: Uterus Measurements: 8.2 x 5.7 x 3.2 cm = volume: 78 mL. No fibroids or other mass visualized. Endometrium Not well visualized due to large right adnexal mass. Right ovary Measurements: 8.5 x 8.5 x 7.6 cm = volume: 287 mL. Large heterogeneous but predominantly solid mass is noted highly concerning for ovarian malignancy or neoplasm. Status post left oophorectomy. Pulsed Doppler evaluation of right ovary demonstrates normal low-resistance arterial and venous waveforms. Other findings No abnormal free fluid. IMPRESSION: Large heterogeneous but predominantly solid mass is noted in the right adnexal region highly concerning for right ovarian neoplasm or malignancy. MRI is recommended for further evaluation as well as gynecologic consultation. Status post left oophorectomy. No definite evidence of right ovarian torsion. Electronically Signed   By: Marijo Conception M.D.   On: 01/13/2022 14:42   CT ABDOMEN PELVIS W CONTRAST  Result Date: 01/13/2022 CLINICAL DATA:  Abdominal pain.  History of endometriosis. EXAM: CT ABDOMEN AND PELVIS WITH CONTRAST TECHNIQUE: Multidetector CT imaging of  the abdomen and pelvis was performed using the standard protocol following bolus administration of intravenous contrast. RADIATION DOSE REDUCTION: This exam was performed according to the departmental dose-optimization program which includes automated exposure control, adjustment of the mA and/or kV according to patient size and/or use of iterative reconstruction technique. CONTRAST:  100 mL OMNIPAQUE IOHEXOL 300 MG/ML  SOLN COMPARISON:  09/21/2014 FINDINGS: Lower chest: No acute abnormality. Hepatobiliary: Medial right lobe hypodensity consistent with focal fatty infiltration. Otherwise no hepatic lesions identified and no intrahepatic biliary ductal dilatation. No gallstones, gallbladder wall thickening, or extrahepatic biliary dilatation. Pancreas: Unremarkable. No pancreatic ductal dilatation or surrounding inflammatory changes. Spleen: Normal in size without focal abnormality. Adrenals/Urinary Tract: There are no precontrast images which limits the ability to assess for the presence of stones. Left kidney demonstrates no hydronephrosis. On the right there is hydronephrosis and hydroureter presumably on the basis of complex mass in the pelvis. Urinary bladder is displaced but otherwise unremarkable. Stomach/Bowel: Stomach is within normal limits. Appendix appears normal. No evidence of bowel wall thickening, distention, or inflammatory changes. Thickening of the descending and sigmoid colon likely on the basis of normal peristalsis without significant pericolonic inflammatory change identified. Vascular/Lymphatic: No significant vascular findings are present. No enlarged abdominal or pelvic lymph nodes. Reproductive: Uterus is displaced by a right adnexal complex cystic peripherally enhancing multiloculated lesion measuring 13 x 13 x 10 cm. Presumably this represents sequelae of patient's history of endometriosis. Other etiologies like neoplasm or abscess are not excluded on the basis of this examination. Other:  No abdominal wall hernia or abnormality. No abdominopelvic ascites. Musculoskeletal: No acute or significant osseous findings. IMPRESSION: 1. Large cystic, multi loculated and peripherally and centrally enhancing process in the right adnexa presumably related to patient's history of endometriosis. Differential would include neoplasm or abscess. 2. Right-sided mild hydronephrosis and hydroureter that appears to be in the basis of mass effect from the right adnexal process. Electronically Signed   By: Sammie Bench M.D.   On: 01/13/2022 14:42    Assessment/Plan: Persistent infectious tubo-ovarian complex, in the background of severe endometriosis, status post transvaginal endometrioma aspiration 6 weeks ago IV antibiotics we will continue.  We will ask for infectious disease reconsult during this hospital stay Interventional radiology consultation was  appreciated.  Patient will be going for CT-guided drainage attempt for the mass.  I will follow along with GYN oncology.  Patient will need to resume her gynecologic care with her referring gynecologist after discharge, since she does not need reproductive endocrinology/infertility care by me anymore.  Governor Specking, MD    Johniya Durfee Kerin Perna 01/14/2022, 2:06 PM

## 2022-01-15 LAB — CBC WITH DIFFERENTIAL/PLATELET
Abs Immature Granulocytes: 0.29 10*3/uL — ABNORMAL HIGH (ref 0.00–0.07)
Basophils Absolute: 0 10*3/uL (ref 0.0–0.1)
Basophils Relative: 0 %
Eosinophils Absolute: 0.1 10*3/uL (ref 0.0–0.5)
Eosinophils Relative: 0 %
HCT: 24.8 % — ABNORMAL LOW (ref 36.0–46.0)
Hemoglobin: 7.9 g/dL — ABNORMAL LOW (ref 12.0–15.0)
Immature Granulocytes: 2 %
Lymphocytes Relative: 17 %
Lymphs Abs: 2.4 10*3/uL (ref 0.7–4.0)
MCH: 25 pg — ABNORMAL LOW (ref 26.0–34.0)
MCHC: 31.9 g/dL (ref 30.0–36.0)
MCV: 78.5 fL — ABNORMAL LOW (ref 80.0–100.0)
Monocytes Absolute: 0.9 10*3/uL (ref 0.1–1.0)
Monocytes Relative: 6 %
Neutro Abs: 10.5 10*3/uL — ABNORMAL HIGH (ref 1.7–7.7)
Neutrophils Relative %: 75 %
Platelets: 477 10*3/uL — ABNORMAL HIGH (ref 150–400)
RBC: 3.16 MIL/uL — ABNORMAL LOW (ref 3.87–5.11)
RDW: 15.1 % (ref 11.5–15.5)
WBC: 14.2 10*3/uL — ABNORMAL HIGH (ref 4.0–10.5)
nRBC: 0 % (ref 0.0–0.2)

## 2022-01-15 LAB — BASIC METABOLIC PANEL
Anion gap: 8 (ref 5–15)
BUN: 6 mg/dL (ref 6–20)
CO2: 23 mmol/L (ref 22–32)
Calcium: 8.7 mg/dL — ABNORMAL LOW (ref 8.9–10.3)
Chloride: 105 mmol/L (ref 98–111)
Creatinine, Ser: 0.63 mg/dL (ref 0.44–1.00)
GFR, Estimated: >60 mL/min (ref >60)
Glucose, Bld: 151 mg/dL — ABNORMAL HIGH (ref 70–99)
Potassium: 3.2 mmol/L — ABNORMAL LOW (ref 3.5–5.1)
Sodium: 136 mmol/L (ref 135–145)

## 2022-01-15 LAB — CBC
HCT: 26.1 % — ABNORMAL LOW (ref 36.0–46.0)
Hemoglobin: 8 g/dL — ABNORMAL LOW (ref 12.0–15.0)
MCH: 24.3 pg — ABNORMAL LOW (ref 26.0–34.0)
MCHC: 30.7 g/dL (ref 30.0–36.0)
MCV: 79.3 fL — ABNORMAL LOW (ref 80.0–100.0)
Platelets: 513 10*3/uL — ABNORMAL HIGH (ref 150–400)
RBC: 3.29 MIL/uL — ABNORMAL LOW (ref 3.87–5.11)
RDW: 14.9 % (ref 11.5–15.5)
WBC: 13 10*3/uL — ABNORMAL HIGH (ref 4.0–10.5)
nRBC: 0 % (ref 0.0–0.2)

## 2022-01-15 LAB — GLUCOSE, CAPILLARY
Glucose-Capillary: 110 mg/dL — ABNORMAL HIGH (ref 70–99)
Glucose-Capillary: 135 mg/dL — ABNORMAL HIGH (ref 70–99)
Glucose-Capillary: 136 mg/dL — ABNORMAL HIGH (ref 70–99)
Glucose-Capillary: 144 mg/dL — ABNORMAL HIGH (ref 70–99)
Glucose-Capillary: 169 mg/dL — ABNORMAL HIGH (ref 70–99)
Glucose-Capillary: 248 mg/dL — ABNORMAL HIGH (ref 70–99)

## 2022-01-15 MED ORDER — METFORMIN HCL ER 500 MG PO TB24
1000.0000 mg | ORAL_TABLET | Freq: Two times a day (BID) | ORAL | Status: DC
  Administered 2022-01-15 – 2022-01-20 (×10): 1000 mg via ORAL
  Filled 2022-01-15 (×10): qty 2

## 2022-01-15 MED ORDER — GLUCERNA SHAKE PO LIQD
237.0000 mL | Freq: Three times a day (TID) | ORAL | Status: DC
  Administered 2022-01-15 – 2022-01-19 (×11): 237 mL via ORAL
  Filled 2022-01-15 (×18): qty 237

## 2022-01-15 MED ORDER — EMPAGLIFLOZIN 25 MG PO TABS
25.0000 mg | ORAL_TABLET | Freq: Every day | ORAL | Status: DC
  Administered 2022-01-15 – 2022-01-20 (×6): 25 mg via ORAL
  Filled 2022-01-15 (×6): qty 1

## 2022-01-15 MED ORDER — POTASSIUM CHLORIDE CRYS ER 20 MEQ PO TBCR
20.0000 meq | EXTENDED_RELEASE_TABLET | Freq: Two times a day (BID) | ORAL | Status: AC
  Administered 2022-01-15 – 2022-01-17 (×6): 20 meq via ORAL
  Filled 2022-01-15 (×6): qty 1

## 2022-01-15 MED ORDER — INSULIN ASPART 100 UNIT/ML IJ SOLN
0.0000 [IU] | Freq: Three times a day (TID) | INTRAMUSCULAR | Status: DC
  Administered 2022-01-15: 3 [IU] via SUBCUTANEOUS
  Administered 2022-01-15 – 2022-01-16 (×2): 1 [IU] via SUBCUTANEOUS
  Administered 2022-01-16: 2 [IU] via SUBCUTANEOUS
  Administered 2022-01-17 – 2022-01-19 (×5): 1 [IU] via SUBCUTANEOUS

## 2022-01-15 MED ORDER — IBUPROFEN 400 MG PO TABS: 600.0000 mg | ORAL_TABLET | Freq: Four times a day (QID) | ORAL | Status: DC | PRN

## 2022-01-15 MED ORDER — ROSUVASTATIN CALCIUM 20 MG PO TABS
40.0000 mg | ORAL_TABLET | Freq: Every day | ORAL | Status: DC
  Administered 2022-01-15 – 2022-01-20 (×6): 40 mg via ORAL
  Filled 2022-01-15 (×6): qty 2

## 2022-01-15 MED ORDER — INSULIN ASPART 100 UNIT/ML IJ SOLN: 0.0000 [IU] | Freq: Every day | INTRAMUSCULAR | Status: DC

## 2022-01-15 MED ORDER — SODIUM CHLORIDE 0.9% FLUSH
5.0000 mL | Freq: Three times a day (TID) | INTRAVENOUS | Status: DC
  Administered 2022-01-15 – 2022-01-20 (×9): 5 mL

## 2022-01-15 MED ORDER — TRAMADOL HCL 50 MG PO TABS
50.0000 mg | ORAL_TABLET | Freq: Four times a day (QID) | ORAL | Status: DC | PRN
  Administered 2022-01-15 – 2022-01-18 (×4): 50 mg via ORAL
  Filled 2022-01-15 (×4): qty 1

## 2022-01-15 NOTE — Progress Notes (Signed)
Referring Physician(s): Lahoma Crocker, MD   Supervising Physician: Juliet Rude  Patient Status:  [facility] - In-pt  Chief Complaint:  F/u Pelvic drain  Brief History:  Marie Hill is a 41 y.o. female with severe endometriosis s/p left salpingo-oophorectomy and recurrent right ovarian endometrioma.  During a recent OB/GYN clinic visit, she reported abdominal pain.  US showed a right endometrioma without free fluid in the pelvis. She was prescribed estrogen suppression medications for 2 weeks however about a week after her visit she presented to the ED with sepsis.   She was treated with IV antibiotics and eventually was discharged home on 12/30/21 with PO antibiotics.   Follow up US in clinic on 11/29 showed a right tubo-ovarian complex adherent to the uterus measuring 10.6 cm.   She was referred to Dr. Berline Lopes for follow up of endometriosis and during her visit on 12/1 she reported persistent abdominal pain, poor appetite, weakness, tachycardia and abdominal pain.   She was sent to the ED from Dr. Charisse March office for further evaluation.   On evaluation in the ED she was found to have WBC 18.0 with tachycardia.   CT abd/pelvis w/contrast was obtained and showed:  1. Large cystic, multi loculated and peripherally and centrally enhancing process in the right adnexa presumably related to patient's history of endometriosis. Differential would include neoplasm or abscess. 2. Right-sided mild hydronephrosis and hydroureter that appears to be in the basis of mass effect from the right adnexal process.   Transabdominal and transvaginal US showed: Large heterogeneous but predominantly solid mass is noted in the right adnexal region highly concerning for right ovarian neoplasm or malignancy. MRI is recommended for further evaluation as well as gynecologic consultation.   Status post left oophorectomy.   No definite evidence of right ovarian torsion.   She underwent image  guided drain placement by Dr. Denna Haggard yesterday.  Subjective:  Doing ok. No c/o. Reports drain has not been flushed.  Allergies: Patient has no known allergies.  Medications: Prior to Admission medications   Medication Sig Start Date End Date Taking? Authorizing Provider  acetaminophen (TYLENOL) 500 MG tablet Take 500 mg by mouth every 6 (six) hours as needed for moderate pain.   Yes [provider]  amoxicillin-clavulanate (AUGMENTIN) 875-125 MG tablet Take 1 tablet by mouth 2 (two) times daily. 12/30/21  Yes Governor Specking, MD  doxycycline (VIBRAMYCIN) 50 MG capsule Take 2 capsules (100 mg total) by mouth 2 (two) times daily. 12/30/21  Yes Governor Specking, MD  empagliflozin (JARDIANCE) 25 MG TABS tablet Take 25 mg by mouth daily.   Yes [provider]  ibuprofen (ADVIL) 200 MG tablet Take 400 mg by mouth every 6 (six) hours as needed for moderate pain.   Yes [provider]  metFORMIN (GLUCOPHAGE-XR) 500 MG 24 hr tablet Take 1,000 mg by mouth 2 (two) times daily with a meal.   Yes [provider]  rosuvastatin (CRESTOR) 40 MG tablet Take 40 mg by mouth daily.   Yes [provider]  traMADol (ULTRAM) 50 MG tablet Take 50 mg by mouth every 6 (six) hours as needed for moderate pain or severe pain. 01/10/22  Yes [provider]     Vital Signs: BP 118/75 (BP Location: Left Arm)   Pulse 84   Temp 98.3 F (36.8 C) (Oral)   Resp 14   Ht '5\' 2"'$  (1.575 m)   Wt 115 lb 4 oz (52.3 kg)   LMP 12/28/2021   SpO2  98%   BMI 21.08 kg/m   Physical Exam Vitals reviewed.  Constitutional:      Appearance: Normal appearance.  Cardiovascular:     Rate and Rhythm: Normal rate.  Pulmonary:     Effort: Pulmonary effort is normal. No respiratory distress.  Musculoskeletal:        General: Normal range of motion.     Cervical back: Normal range of motion.  Skin:    General: Skin is warm and dry.  Neurological:     General: No focal  deficit present.     Mental Status: She is alert and oriented to person, place, and time.  Psychiatric:        Mood and Affect: Mood normal.        Behavior: Behavior normal.        Thought Content: Thought content normal.        Judgment: Judgment normal.    Drain Location: RLQ Size: Fr size: 12 Fr Date of placement: 01/14/22  Currently to: Drain collection device: suction bulb 24 hour output:  Output by Drain (mL) 01/13/22 0701 - 01/13/22 1900 01/13/22 1901 - 01/14/22 0700 01/14/22 0701 - 01/14/22 1900 01/14/22 1901 - 01/15/22 0700 01/15/22 0701 - 01/15/22 1240  Closed System Drain 1 RUQ Bulb (JP) 12 Fr.   100  15   Current examination:  Upon initial evaluation, there was blood in the tubing that leads to the bulb. When I removed the tubing from the drain to flush, I removed a very large blood clot from the drain. Once I did that, purulent material began to easily flow from the drain.  Now the drain flushes/aspirates easily.  Insertion site unremarkable. Suture and stat lock in place. Dressed appropriately.    Labs:  CBC: Recent Labs    12/31/21 0531 01/13/22 0921 01/14/22 1236 01/15/22 0417  WBC 23.3* 18.0* 20.0* 14.2*  HGB 9.0* 10.1* 8.4* 7.9*  HCT 27.3* 31.5* 28.5* 24.8*  PLT 442* 671* 583* 477*    COAGS: Recent Labs    12/26/21 1058  INR 1.4*  APTT 33    BMP: Recent Labs    12/28/21 1308 01/13/22 0921 01/14/22 1236 01/15/22 0417  NA 138 131* 139 136  K 3.0* 4.1 3.6 3.2*  CL 110 94* 107 105  CO2 15* 27 21* 23  GLUCOSE 87 277* 121* 151*  BUN '7 10 9 6  '$ CALCIUM 8.3* 11.2* 9.0 8.7*  CREATININE 0.72 0.62 0.68 0.63  GFRNONAA >60 >60 >60 >60    LIVER FUNCTION TESTS: Recent Labs    12/26/21 1058 01/13/22 0921 01/14/22 1236  BILITOT 1.4* 0.7 0.6  AST 41 70* 23  ALT 59* 97* 57*  ALKPHOS 347* 810* 518*  PROT 7.3 10.0* 7.7  ALBUMIN 2.4* 3.8 2.6*    Assessment:  S/P drain placement for pelvic abscess.  Tubing initially clotted. I  successfully removed the clot from the drain and from the suction tubing. Drain now flushes easily.  Plan:  I discussed plan with patient's nurse and with charge nurse. There was apparently some confusion with the flush order. The previous nurse thought it was to flush the IV. I have added a new order with comments to flush the pelvic drain.  Continue TID flushes with 5 cc NS. Record output Q shift. Dressing changes QD or PRN if soiled.  Call IR APP or on call IR MD if difficulty flushing or sudden change in drain output.   Repeat imaging/possible drain injection once output < 10  mL/QD (excluding flush material). Consideration for drain removal if output is < 10 mL/QD (excluding flush material), pending discussion with the providing surgical service.  Discharge planning: Please contact IR APP or on call IR MD prior to patient d/c to ensure appropriate follow up plans are in place. Typically patient will follow up with IR clinic 10-14 days post d/c for repeat imaging/possible drain injection. IR scheduler will contact patient with date/time of appointment. Patient will need to flush drain QD with 5 cc NS, record output QD, dressing changes every 2-3 days or earlier if soiled.   IR will continue to follow - please call with questions or concerns.   Electronically Signed: Murrell Redden, PA-C 01/15/2022, 12:27 PM    I spent a total of 25 Minutes at the the patient's bedside AND on the patient's hospital floor or unit, greater than 50% of which was counseling/coordinating care for f/u drain.

## 2022-01-15 NOTE — Progress Notes (Signed)
Interim events: S/P IR drain placement yesterday  Subjective: Interval History: mild RLQ pain, worse w/movement.  Appetite returning  Objective: Vital signs in last 24 hours: Temp:  [98.7 F (37.1 C)-100 F (37.8 C)] 98.7 F (37.1 C) (12/03 0332) Pulse Rate:  [91-103] 93 (12/03 0332) Resp:  [14-30] 18 (12/03 0332) BP: (111-136)/(67-83) 111/67 (12/03 0332) SpO2:  [96 %-98 %] 96 % (12/03 0332)  Intake/Output from previous day: 12/02 0701 - 12/03 0700 In: 1566.7 [P.O.:240; I.V.:1265.1] Out: 1200 [Urine:1100; Drains:100] Intake/Output this shift: No intake/output data recorded.  BP 111/67 (BP Location: Left Arm)   Pulse 93   Temp 98.7 F (37.1 C) (Oral)   Resp 18   Ht '5\' 2"'$  (1.575 m)   Wt 52.3 kg   LMP 12/28/2021   SpO2 96%   BMI 21.08 kg/m   General Appearance:    Alert, cooperative, no distress, appears stated age  Abdomen:     Soft, drain/dressing C/D--bloody drainage  Extremities:   Extremities normal, atraumatic, no cyanosis or edema    Results for orders placed or performed during the hospital encounter of 01/13/22 (from the past 24 hour(s))  Glucose, capillary     Status: Abnormal   Collection Time: 01/14/22 11:50 AM  Result Value Ref Range   Glucose-Capillary 115 (H) 70 - 99 mg/dL  CBC with Differential     Status: Abnormal   Collection Time: 01/14/22 12:36 PM  Result Value Ref Range   WBC 20.0 (H) 4.0 - 10.5 K/uL   RBC 3.47 (L) 3.87 - 5.11 MIL/uL   Hemoglobin 8.4 (L) 12.0 - 15.0 g/dL   HCT 28.5 (L) 36.0 - 46.0 %   MCV 82.1 80.0 - 100.0 fL   MCH 24.2 (L) 26.0 - 34.0 pg   MCHC 29.5 (L) 30.0 - 36.0 g/dL   RDW 15.2 11.5 - 15.5 %   Platelets 583 (H) 150 - 400 K/uL   nRBC 0.0 0.0 - 0.2 %   Neutrophils Relative % 77 %   Neutro Abs 15.2 (H) 1.7 - 7.7 K/uL   Lymphocytes Relative 14 %   Lymphs Abs 2.9 0.7 - 4.0 K/uL   Monocytes Relative 7 %   Monocytes Absolute 1.3 (H) 0.1 - 1.0 K/uL   Eosinophils Relative 0 %   Eosinophils Absolute 0.1 0.0 - 0.5 K/uL    Basophils Relative 0 %   Basophils Absolute 0.1 0.0 - 0.1 K/uL   Immature Granulocytes 2 %   Abs Immature Granulocytes 0.46 (H) 0.00 - 0.07 K/uL  Comprehensive metabolic panel     Status: Abnormal   Collection Time: 01/14/22 12:36 PM  Result Value Ref Range   Sodium 139 135 - 145 mmol/L   Potassium 3.6 3.5 - 5.1 mmol/L   Chloride 107 98 - 111 mmol/L   CO2 21 (L) 22 - 32 mmol/L   Glucose, Bld 121 (H) 70 - 99 mg/dL   BUN 9 6 - 20 mg/dL   Creatinine, Ser 0.68 0.44 - 1.00 mg/dL   Calcium 9.0 8.9 - 10.3 mg/dL   Total Protein 7.7 6.5 - 8.1 g/dL   Albumin 2.6 (L) 3.5 - 5.0 g/dL   AST 23 15 - 41 U/L   ALT 57 (H) 0 - 44 U/L   Alkaline Phosphatase 518 (H) 38 - 126 U/L   Total Bilirubin 0.6 0.3 - 1.2 mg/dL   GFR, Estimated >60 >60 mL/min   Anion gap 11 5 - 15  Glucose, capillary     Status: Abnormal  Collection Time: 01/14/22  5:17 PM  Result Value Ref Range   Glucose-Capillary 107 (H) 70 - 99 mg/dL  Aerobic/Anaerobic Culture w Gram Stain (surgical/deep wound)     Status: None (Preliminary result)   Collection Time: 01/14/22  5:33 PM   Specimen: Pelvis; Abscess  Result Value Ref Range   Specimen Description      PELVIS Performed at [facility] [address] [place], [place] [address]    Special Requests      NONE Performed at [facility], [place] [address], [place], [place] [address]    Gram Stain      RARE WBC PRESENT, PREDOMINANTLY PMN FEW GRAM POSITIVE COCCI IN PAIRS IN CHAINS RARE GRAM NEGATIVE RODS    Culture      NO GROWTH < 24 HOURS Performed at [facility], [place] [address], [place], [place] [address]    Report Status PENDING   Glucose, capillary     Status: Abnormal   Collection Time: 01/14/22  9:11 PM  Result Value Ref Range   Glucose-Capillary 184 (H) 70 - 99 mg/dL  Glucose, capillary     Status: Abnormal   Collection Time: 01/14/22 11:56 PM  Result Value Ref Range   Glucose-Capillary 140 (H) 70 - 99 mg/dL   Glucose, capillary     Status: Abnormal   Collection Time: 01/15/22  3:41 AM  Result Value Ref Range   Glucose-Capillary 144 (H) 70 - 99 mg/dL  CBC with Differential     Status: Abnormal   Collection Time: 01/15/22  4:17 AM  Result Value Ref Range   WBC 14.2 (H) 4.0 - 10.5 K/uL   RBC 3.16 (L) 3.87 - 5.11 MIL/uL   Hemoglobin 7.9 (L) 12.0 - 15.0 g/dL   HCT 24.8 (L) 36.0 - 46.0 %   MCV 78.5 (L) 80.0 - 100.0 fL   MCH 25.0 (L) 26.0 - 34.0 pg   MCHC 31.9 30.0 - 36.0 g/dL   RDW 15.1 11.5 - 15.5 %   Platelets 477 (H) 150 - 400 K/uL   nRBC 0.0 0.0 - 0.2 %   Neutrophils Relative % 75 %   Neutro Abs 10.5 (H) 1.7 - 7.7 K/uL   Lymphocytes Relative 17 %   Lymphs Abs 2.4 0.7 - 4.0 K/uL   Monocytes Relative 6 %   Monocytes Absolute 0.9 0.1 - 1.0 K/uL   Eosinophils Relative 0 %   Eosinophils Absolute 0.1 0.0 - 0.5 K/uL   Basophils Relative 0 %   Basophils Absolute 0.0 0.0 - 0.1 K/uL   Immature Granulocytes 2 %   Abs Immature Granulocytes 0.29 (H) 0.00 - 0.07 K/uL  Basic metabolic panel     Status: Abnormal   Collection Time: 01/15/22  4:17 AM  Result Value Ref Range   Sodium 136 135 - 145 mmol/L   Potassium 3.2 (L) 3.5 - 5.1 mmol/L   Chloride 105 98 - 111 mmol/L   CO2 23 22 - 32 mmol/L   Glucose, Bld 151 (H) 70 - 99 mg/dL   BUN 6 6 - 20 mg/dL   Creatinine, Ser 0.63 0.44 - 1.00 mg/dL   Calcium 8.7 (L) 8.9 - 10.3 mg/dL   GFR, Estimated >60 >60 mL/min   Anion gap 8 5 - 15  Glucose, capillary     Status: Abnormal   Collection Time: 01/15/22  7:52 AM  Result Value Ref Range   Glucose-Capillary 169 (H) 70 - 99 mg/dL  Studies/Results: CT guided needle placement  Result Date: 01/14/2022 INDICATION: Pelvic abscess; discussion with referring gyn revealed that patient had undergone transvaginal procedure approximately 1 month prior to today's presentation, and had previous treatment for infection with antibiotics, now returns with leukocytosis and pelvic pain with high clinical concern for  worsening infection. EXAM: CT-guided pelvic drain placement TECHNIQUE: Multidetector CT imaging of the abdomen and pelvis was performed following the standard protocol without IV contrast. RADIATION DOSE REDUCTION: This exam was performed according to the departmental dose-optimization program which includes automated exposure control, adjustment of the mA and/or kV according to patient size and/or use of iterative reconstruction technique. MEDICATIONS: The patient is currently admitted to the hospital and receiving intravenous antibiotics. The antibiotics were administered within an appropriate time frame prior to the initiation of the procedure. ANESTHESIA/SEDATION: Moderate (conscious) sedation was employed during this procedure. A total of Versed 2 mg and Fentanyl 100 mcg was administered intravenously by the radiology nurse. Total intra-service moderate Sedation Time: 20 minutes. The patient's level of consciousness and vital signs were monitored continuously by radiology nursing throughout the procedure under my direct supervision. COMPLICATIONS: None immediate. PROCEDURE: Informed written consent was obtained from the patient after a thorough discussion of the procedural risks, benefits and alternatives. All questions were addressed. Maximal Sterile Barrier Technique was utilized including caps, mask, sterile gowns, sterile gloves, sterile drape, hand hygiene and skin antiseptic. A timeout was performed prior to the initiation of the procedure. The patient was placed supine on the exam table. Limited CT of the abdomen and pelvis was performed for planning purposes. This demonstrated complex pelvic collection. Skin entry site was marked, and the overlying skin was prepped and draped in the standard sterile fashion. Local analgesia was obtained with 1% lidocaine. Using intermittent CT fluoroscopy, a 19 gauge Yueh catheter was advanced into the largest fluid component identified within the complex pelvic  collection using a left anterolateral approach. Aspiration of the fluid yielded thick purulent material, which was difficult to aspirate via the Yueh catheter. Therefore, the decision was made to proceed with drain placement. An 035 wire was advanced through the access needle, over which the percutaneous tract was serially dilated to accommodate a 12 French locking drainage catheter. Location was confirmed with CT and return of additional purulent material. A sample was sent to the lab for microbiology analysis locking loop was formed, and the catheter was secured to the skin using silk suture and a dressing. It was attached to bulb suction. The patient tolerated the procedure well without immediate complication. IMPRESSION: Successful CT-guided placement of a 12 French locking drainage catheter into complex pelvic collection with return of purulent material. A sample was sent to the lab for microbiology analysis. Electronically Signed   By: Albin Felling M.D.   On: 01/14/2022 17:31   US Pelvis Complete  Result Date: 01/13/2022 CLINICAL DATA:  Pelvic pain. EXAM: TRANSABDOMINAL AND TRANSVAGINAL ULTRASOUND OF PELVIS DOPPLER ULTRASOUND OF OVARIES TECHNIQUE: Both transabdominal and transvaginal ultrasound examinations of the pelvis were performed. Transabdominal technique was performed for global imaging of the pelvis including uterus, ovaries, adnexal regions, and pelvic cul-de-sac. It was necessary to proceed with endovaginal exam following the transabdominal exam to visualize the endometrium and ovaries. Color and duplex Doppler ultrasound was utilized to evaluate blood flow to the ovaries. COMPARISON:  CT scan of same day.  Ultrasound of December 26, 2021. FINDINGS: Uterus Measurements: 8.2 x 5.7 x 3.2 cm = volume: 78 mL. No fibroids or other mass visualized. Endometrium  Not well visualized due to large right adnexal mass. Right ovary Measurements: 8.5 x 8.5 x 7.6 cm = volume: 287 mL. Large heterogeneous but  predominantly solid mass is noted highly concerning for ovarian malignancy or neoplasm. Status post left oophorectomy. Pulsed Doppler evaluation of right ovary demonstrates normal low-resistance arterial and venous waveforms. Other findings No abnormal free fluid. IMPRESSION: Large heterogeneous but predominantly solid mass is noted in the right adnexal region highly concerning for right ovarian neoplasm or malignancy. MRI is recommended for further evaluation as well as gynecologic consultation. Status post left oophorectomy. No definite evidence of right ovarian torsion. Electronically Signed   By: Marijo Conception M.D.   On: 01/13/2022 14:42   US Transvaginal Non-OB  Result Date: 01/13/2022 CLINICAL DATA:  Pelvic pain. EXAM: TRANSABDOMINAL AND TRANSVAGINAL ULTRASOUND OF PELVIS DOPPLER ULTRASOUND OF OVARIES TECHNIQUE: Both transabdominal and transvaginal ultrasound examinations of the pelvis were performed. Transabdominal technique was performed for global imaging of the pelvis including uterus, ovaries, adnexal regions, and pelvic cul-de-sac. It was necessary to proceed with endovaginal exam following the transabdominal exam to visualize the endometrium and ovaries. Color and duplex Doppler ultrasound was utilized to evaluate blood flow to the ovaries. COMPARISON:  CT scan of same day.  Ultrasound of December 26, 2021. FINDINGS: Uterus Measurements: 8.2 x 5.7 x 3.2 cm = volume: 78 mL. No fibroids or other mass visualized. Endometrium Not well visualized due to large right adnexal mass. Right ovary Measurements: 8.5 x 8.5 x 7.6 cm = volume: 287 mL. Large heterogeneous but predominantly solid mass is noted highly concerning for ovarian malignancy or neoplasm. Status post left oophorectomy. Pulsed Doppler evaluation of right ovary demonstrates normal low-resistance arterial and venous waveforms. Other findings No abnormal free fluid. IMPRESSION: Large heterogeneous but predominantly solid mass is noted in the  right adnexal region highly concerning for right ovarian neoplasm or malignancy. MRI is recommended for further evaluation as well as gynecologic consultation. Status post left oophorectomy. No definite evidence of right ovarian torsion. Electronically Signed   By: Marijo Conception M.D.   On: 01/13/2022 14:42   [place] Art/Ven Flow Abd Pelv Doppler  Result Date: 01/13/2022 CLINICAL DATA:  Pelvic pain. EXAM: TRANSABDOMINAL AND TRANSVAGINAL ULTRASOUND OF PELVIS DOPPLER ULTRASOUND OF OVARIES TECHNIQUE: Both transabdominal and transvaginal ultrasound examinations of the pelvis were performed. Transabdominal technique was performed for global imaging of the pelvis including uterus, ovaries, adnexal regions, and pelvic cul-de-sac. It was necessary to proceed with endovaginal exam following the transabdominal exam to visualize the endometrium and ovaries. Color and duplex Doppler ultrasound was utilized to evaluate blood flow to the ovaries. COMPARISON:  CT scan of same day.  Ultrasound of December 26, 2021. FINDINGS: Uterus Measurements: 8.2 x 5.7 x 3.2 cm = volume: 78 mL. No fibroids or other mass visualized. Endometrium Not well visualized due to large right adnexal mass. Right ovary Measurements: 8.5 x 8.5 x 7.6 cm = volume: 287 mL. Large heterogeneous but predominantly solid mass is noted highly concerning for ovarian malignancy or neoplasm. Status post left oophorectomy. Pulsed Doppler evaluation of right ovary demonstrates normal low-resistance arterial and venous waveforms. Other findings No abnormal free fluid. IMPRESSION: Large heterogeneous but predominantly solid mass is noted in the right adnexal region highly concerning for right ovarian neoplasm or malignancy. MRI is recommended for further evaluation as well as gynecologic consultation. Status post left oophorectomy. No definite evidence of right ovarian torsion. Electronically Signed   By: Marijo Conception M.D.   On:  01/13/2022 14:42   CT ABDOMEN PELVIS W  CONTRAST  Result Date: 01/13/2022 CLINICAL DATA:  Abdominal pain.  History of endometriosis. EXAM: CT ABDOMEN AND PELVIS WITH CONTRAST TECHNIQUE: Multidetector CT imaging of the abdomen and pelvis was performed using the standard protocol following bolus administration of intravenous contrast. RADIATION DOSE REDUCTION: This exam was performed according to the departmental dose-optimization program which includes automated exposure control, adjustment of the mA and/or kV according to patient size and/or use of iterative reconstruction technique. CONTRAST:  100 mL OMNIPAQUE IOHEXOL 300 MG/ML  SOLN COMPARISON:  09/21/2014 FINDINGS: Lower chest: No acute abnormality. Hepatobiliary: Medial right lobe hypodensity consistent with focal fatty infiltration. Otherwise no hepatic lesions identified and no intrahepatic biliary ductal dilatation. No gallstones, gallbladder wall thickening, or extrahepatic biliary dilatation. Pancreas: Unremarkable. No pancreatic ductal dilatation or surrounding inflammatory changes. Spleen: Normal in size without focal abnormality. Adrenals/Urinary Tract: There are no precontrast images which limits the ability to assess for the presence of stones. Left kidney demonstrates no hydronephrosis. On the right there is hydronephrosis and hydroureter presumably on the basis of complex mass in the pelvis. Urinary bladder is displaced but otherwise unremarkable. Stomach/Bowel: Stomach is within normal limits. Appendix appears normal. No evidence of bowel wall thickening, distention, or inflammatory changes. Thickening of the descending and sigmoid colon likely on the basis of normal peristalsis without significant pericolonic inflammatory change identified. Vascular/Lymphatic: No significant vascular findings are present. No enlarged abdominal or pelvic lymph nodes. Reproductive: Uterus is displaced by a right adnexal complex cystic peripherally enhancing multiloculated lesion measuring 13 x 13 x 10  cm. Presumably this represents sequelae of patient's history of endometriosis. Other etiologies like neoplasm or abscess are not excluded on the basis of this examination. Other: No abdominal wall hernia or abnormality. No abdominopelvic ascites. Musculoskeletal: No acute or significant osseous findings. IMPRESSION: 1. Large cystic, multi loculated and peripherally and centrally enhancing process in the right adnexa presumably related to patient's history of endometriosis. Differential would include neoplasm or abscess. 2. Right-sided mild hydronephrosis and hydroureter that appears to be in the basis of mass effect from the right adnexal process. Electronically Signed   By: Sammie Bench M.D.   On: 01/13/2022 14:42   US Pelvis Complete  Result Date: 12/26/2021 CLINICAL DATA:  Right adnexal mass seen in previous CT done earlier today EXAM: TRANSABDOMINAL AND TRANSVAGINAL ULTRASOUND OF PELVIS DOPPLER ULTRASOUND OF OVARIES TECHNIQUE: Both transabdominal and transvaginal ultrasound examinations of the pelvis were performed. Transabdominal technique was performed for global imaging of the pelvis including uterus, ovaries, adnexal regions, and pelvic cul-de-sac. It was necessary to proceed with endovaginal exam following the transabdominal exam to visualize the endometrium and ovaries. Color and duplex Doppler ultrasound was utilized to evaluate blood flow to the ovaries. COMPARISON:  Previous studies including the CT done earlier today and pelvic sonogram done on 05/15/2017 FINDINGS: Uterus Measurements: 9.5 x 5.5 x 5.8 cm = volume: 157.7 mL. There is slightly inhomogeneous echogenicity in myometrium. Nabothian cysts are seen in cervix. There is possible calcification in the margin of 1 of the nabothian cysts. Endometrium Thickness: 12 mm.  No focal abnormality visualized. Right ovary Measurements: 9.7 x 7.4 x 6.6 cm = volume: 244.9 mL. There is inhomogeneous echogenicity with multiple hypoechoic lesions of  varying sizes largest measuring 5 x 6.6 cm. There are other smaller lesions in the right ovary. There is arterial and venous flow within the right ovary in color flow and pulsed Doppler examination. Left ovary Not sonographically visualized  consistent with left oophorectomy. Pulsed Doppler evaluation of right ovary demonstrates normal low-resistance arterial and venous waveforms. Other findings No abnormal free fluid. IMPRESSION: There is marked enlargement of right ovary. There are multiple smooth marginated lesions of varying sizes largest measuring 6.6 cm in maximum diameter. Findings suggestive multiple hemorrhagic right ovarian cysts. There is no evidence of right ovarian torsion. Left ovary is surgically absent. There is inhomogeneous echogenicity in myometrium without no discrete focal abnormalities. Electronically Signed   By: Elmer Picker M.D.   On: 12/26/2021 15:04   US Transvaginal Non-OB  Result Date: 12/26/2021 CLINICAL DATA:  Right adnexal mass seen in previous CT done earlier today EXAM: TRANSABDOMINAL AND TRANSVAGINAL ULTRASOUND OF PELVIS DOPPLER ULTRASOUND OF OVARIES TECHNIQUE: Both transabdominal and transvaginal ultrasound examinations of the pelvis were performed. Transabdominal technique was performed for global imaging of the pelvis including uterus, ovaries, adnexal regions, and pelvic cul-de-sac. It was necessary to proceed with endovaginal exam following the transabdominal exam to visualize the endometrium and ovaries. Color and duplex Doppler ultrasound was utilized to evaluate blood flow to the ovaries. COMPARISON:  Previous studies including the CT done earlier today and pelvic sonogram done on 05/15/2017 FINDINGS: Uterus Measurements: 9.5 x 5.5 x 5.8 cm = volume: 157.7 mL. There is slightly inhomogeneous echogenicity in myometrium. Nabothian cysts are seen in cervix. There is possible calcification in the margin of 1 of the nabothian cysts. Endometrium Thickness: 12 mm.  No  focal abnormality visualized. Right ovary Measurements: 9.7 x 7.4 x 6.6 cm = volume: 244.9 mL. There is inhomogeneous echogenicity with multiple hypoechoic lesions of varying sizes largest measuring 5 x 6.6 cm. There are other smaller lesions in the right ovary. There is arterial and venous flow within the right ovary in color flow and pulsed Doppler examination. Left ovary Not sonographically visualized consistent with left oophorectomy. Pulsed Doppler evaluation of right ovary demonstrates normal low-resistance arterial and venous waveforms. Other findings No abnormal free fluid. IMPRESSION: There is marked enlargement of right ovary. There are multiple smooth marginated lesions of varying sizes largest measuring 6.6 cm in maximum diameter. Findings suggestive multiple hemorrhagic right ovarian cysts. There is no evidence of right ovarian torsion. Left ovary is surgically absent. There is inhomogeneous echogenicity in myometrium without no discrete focal abnormalities. Electronically Signed   By: Elmer Picker M.D.   On: 12/26/2021 15:04   [place] Art/Ven Flow Abd Pelv Doppler  Result Date: 12/26/2021 CLINICAL DATA:  Right adnexal mass seen in previous CT done earlier today EXAM: TRANSABDOMINAL AND TRANSVAGINAL ULTRASOUND OF PELVIS DOPPLER ULTRASOUND OF OVARIES TECHNIQUE: Both transabdominal and transvaginal ultrasound examinations of the pelvis were performed. Transabdominal technique was performed for global imaging of the pelvis including uterus, ovaries, adnexal regions, and pelvic cul-de-sac. It was necessary to proceed with endovaginal exam following the transabdominal exam to visualize the endometrium and ovaries. Color and duplex Doppler ultrasound was utilized to evaluate blood flow to the ovaries. COMPARISON:  Previous studies including the CT done earlier today and pelvic sonogram done on 05/15/2017 FINDINGS: Uterus Measurements: 9.5 x 5.5 x 5.8 cm = volume: 157.7 mL. There is slightly  inhomogeneous echogenicity in myometrium. Nabothian cysts are seen in cervix. There is possible calcification in the margin of 1 of the nabothian cysts. Endometrium Thickness: 12 mm.  No focal abnormality visualized. Right ovary Measurements: 9.7 x 7.4 x 6.6 cm = volume: 244.9 mL. There is inhomogeneous echogenicity with multiple hypoechoic lesions of varying sizes largest measuring 5 x 6.6 cm. There are  other smaller lesions in the right ovary. There is arterial and venous flow within the right ovary in color flow and pulsed Doppler examination. Left ovary Not sonographically visualized consistent with left oophorectomy. Pulsed Doppler evaluation of right ovary demonstrates normal low-resistance arterial and venous waveforms. Other findings No abnormal free fluid. IMPRESSION: There is marked enlargement of right ovary. There are multiple smooth marginated lesions of varying sizes largest measuring 6.6 cm in maximum diameter. Findings suggestive multiple hemorrhagic right ovarian cysts. There is no evidence of right ovarian torsion. Left ovary is surgically absent. There is inhomogeneous echogenicity in myometrium without no discrete focal abnormalities. Electronically Signed   By: Elmer Picker M.D.   On: 12/26/2021 15:04   CT ABDOMEN PELVIS W CONTRAST  Result Date: 12/26/2021 CLINICAL DATA:  Right lower quadrant abdominal pain with vomiting and fever for 2 weeks following surgery for endometriosis. EXAM: CT CHEST, ABDOMEN, AND PELVIS WITH CONTRAST TECHNIQUE: Multidetector CT imaging of the chest, abdomen and pelvis was performed following the standard protocol during bolus administration of intravenous contrast. RADIATION DOSE REDUCTION: This exam was performed according to the departmental dose-optimization program which includes automated exposure control, adjustment of the mA and/or kV according to patient size and/or use of iterative reconstruction technique. CONTRAST:  121m OMNIPAQUE IOHEXOL 300  MG/ML  SOLN COMPARISON:  Abdominopelvic CT 09/21/2014. Chest radiographs 12/26/2021. FINDINGS: CT CHEST FINDINGS Cardiovascular: No significant vascular findings. The heart size is normal. There is no pericardial effusion. Mediastinum/Nodes: There are no enlarged mediastinal, hilar or axillary lymph nodes. The thyroid gland, trachea and esophagus demonstrate no significant findings. Lungs/Pleura: There is no pleural effusion. The lungs are clear. Musculoskeletal/Chest wall: No chest wall mass or suspicious osseous findings. CT ABDOMEN AND PELVIS FINDINGS Hepatobiliary: The liver is normal in density without suspicious focal abnormality. Focal fat in the left hepatic lobe adjacent to the falciform ligament, similar to previous study. No evidence of gallstones, gallbladder wall thickening or biliary dilatation. Pancreas: Unremarkable. No pancreatic ductal dilatation or surrounding inflammatory changes. Spleen: Normal in size without focal abnormality. Adrenals/Urinary Tract: Both adrenal glands appear normal. No evidence of urinary tract calculus, suspicious renal lesion or hydronephrosis. The bladder appears normal for its degree of distention. Stomach/Bowel: No enteric contrast administered. The stomach appears unremarkable for its degree of distension. No evidence of bowel wall thickening, distention or surrounding inflammatory change. The appendix is not clearly demonstrated. Vascular/Lymphatic: There are multiple new mildly prominent retroperitoneal lymph nodes which are likely reactive. No pelvic adenopathy. No significant vascular findings. Reproductive: There is a complex, multi-septated cystic right adnexal mass which measures 9.2 x 6.2 x 7.0 cm. This mass is new compared with the previous CT and demonstrates thickened septations. No left adnexal mass or suspicious uterine findings. Small cervical nabothian cysts are noted. Other: Mild pelvic soft tissue edema without focal fluid collection, ascites or free  air. Musculoskeletal: No acute or significant osseous findings. IMPRESSION: 1. Complex, multi-septated cystic right adnexal mass measuring up to 9.2 cm in diameter, new compared with previous CT from 2016. Appearance is nonspecific and could reflect an atypical endometrioma or tubo-ovarian abscess. Correlate with recent surgical procedure/findings and presumed outside imaging. Neoplasm not excluded by this examination. Ultrasound may be helpful for further characterization. 2. Mild pelvic soft tissue edema without focal fluid collection, ascites or free air. The appendix is not clearly demonstrated. 3. Mildly prominent retroperitoneal lymph nodes, likely reactive. 4. No acute findings in the chest. Electronically Signed   By: WCaryl ComesD.  On: 12/26/2021 13:23   CT Chest W Contrast  Result Date: 12/26/2021 CLINICAL DATA:  Right lower quadrant abdominal pain with vomiting and fever for 2 weeks following surgery for endometriosis. EXAM: CT CHEST, ABDOMEN, AND PELVIS WITH CONTRAST TECHNIQUE: Multidetector CT imaging of the chest, abdomen and pelvis was performed following the standard protocol during bolus administration of intravenous contrast. RADIATION DOSE REDUCTION: This exam was performed according to the departmental dose-optimization program which includes automated exposure control, adjustment of the mA and/or kV according to patient size and/or use of iterative reconstruction technique. CONTRAST:  162m OMNIPAQUE IOHEXOL 300 MG/ML  SOLN COMPARISON:  Abdominopelvic CT 09/21/2014. Chest radiographs 12/26/2021. FINDINGS: CT CHEST FINDINGS Cardiovascular: No significant vascular findings. The heart size is normal. There is no pericardial effusion. Mediastinum/Nodes: There are no enlarged mediastinal, hilar or axillary lymph nodes. The thyroid gland, trachea and esophagus demonstrate no significant findings. Lungs/Pleura: There is no pleural effusion. The lungs are clear. Musculoskeletal/Chest wall:  No chest wall mass or suspicious osseous findings. CT ABDOMEN AND PELVIS FINDINGS Hepatobiliary: The liver is normal in density without suspicious focal abnormality. Focal fat in the left hepatic lobe adjacent to the falciform ligament, similar to previous study. No evidence of gallstones, gallbladder wall thickening or biliary dilatation. Pancreas: Unremarkable. No pancreatic ductal dilatation or surrounding inflammatory changes. Spleen: Normal in size without focal abnormality. Adrenals/Urinary Tract: Both adrenal glands appear normal. No evidence of urinary tract calculus, suspicious renal lesion or hydronephrosis. The bladder appears normal for its degree of distention. Stomach/Bowel: No enteric contrast administered. The stomach appears unremarkable for its degree of distension. No evidence of bowel wall thickening, distention or surrounding inflammatory change. The appendix is not clearly demonstrated. Vascular/Lymphatic: There are multiple new mildly prominent retroperitoneal lymph nodes which are likely reactive. No pelvic adenopathy. No significant vascular findings. Reproductive: There is a complex, multi-septated cystic right adnexal mass which measures 9.2 x 6.2 x 7.0 cm. This mass is new compared with the previous CT and demonstrates thickened septations. No left adnexal mass or suspicious uterine findings. Small cervical nabothian cysts are noted. Other: Mild pelvic soft tissue edema without focal fluid collection, ascites or free air. Musculoskeletal: No acute or significant osseous findings. IMPRESSION: 1. Complex, multi-septated cystic right adnexal mass measuring up to 9.2 cm in diameter, new compared with previous CT from 2016. Appearance is nonspecific and could reflect an atypical endometrioma or tubo-ovarian abscess. Correlate with recent surgical procedure/findings and presumed outside imaging. Neoplasm not excluded by this examination. Ultrasound may be helpful for further characterization.  2. Mild pelvic soft tissue edema without focal fluid collection, ascites or free air. The appendix is not clearly demonstrated. 3. Mildly prominent retroperitoneal lymph nodes, likely reactive. 4. No acute findings in the chest. Electronically Signed   By: WRichardean SaleM.D.   On: 12/26/2021 13:23   DG Chest Port 1 View  Result Date: 12/26/2021 CLINICAL DATA:  Questionable sepsis, evaluate for abnormality. Nausea and vomiting. Fevers. Gyn surgery 2 weeks ago. EXAM: PORTABLE CHEST 1 VIEW COMPARISON:  None Available. FINDINGS: The heart size and mediastinal contours are within normal limits. Both lungs are clear. The visualized skeletal structures are unremarkable. IMPRESSION: Negative one-view chest x-ray. Electronically Signed   By: CSan MorelleM.D.   On: 12/26/2021 11:17    Scheduled Meds:  empagliflozin  25 mg Oral Daily   enoxaparin (LOVENOX) injection  40 mg Subcutaneous Q24H   feeding supplement (GLUCERNA SHAKE)  237 mL Oral TID BM   insulin aspart  0-5 Units Subcutaneous QHS   insulin aspart  0-9 Units Subcutaneous TID WC   metFORMIN  1,000 mg Oral BID WC   potassium chloride  20 mEq Oral BID   rosuvastatin  40 mg Oral Daily   sodium chloride flush  5 mL Intracatheter Q8H   Continuous Infusions:  piperacillin-tazobactam (ZOSYN)  IV 3.375 g (01/15/22 0600)   PRN Meds:ibuprofen, morphine injection, traMADol  Assessment/Plan: Large TOA following imaged-guided transvaginal drainage of endometriomas.  Now s/p IR drainage/drain placement.  Pain controlled w/meds  ID: Afebrile/downward trending WBC.  Blood cxs NG Endo: h/o type 2 DM. CBGs < 180 CBG (last 3)  Recent Labs    01/14/22 2356 01/15/22 0341 01/15/22 0752  GLUCAP 140* 144* 169*   FEN: Mild hypokalemia, borderline nutritional status GI: Elevated LFTs--downward trending HEME: Acute on chronic anemia--dilutional, blood draws  >continue broad spectrum abx >serial labs; review C&S from drainage of abscess cavity  to possibly inform antibiotic coverage >resume carb modified diet w/home regimen >replace K >add nutritional supplement    LOS: 2 days

## 2022-01-16 LAB — CBC WITH DIFFERENTIAL/PLATELET
Abs Immature Granulocytes: 0.29 10*3/uL — ABNORMAL HIGH (ref 0.00–0.07)
Basophils Absolute: 0 10*3/uL (ref 0.0–0.1)
Basophils Relative: 0 %
Eosinophils Absolute: 0.1 10*3/uL (ref 0.0–0.5)
Eosinophils Relative: 1 %
HCT: 28.5 % — ABNORMAL LOW (ref 36.0–46.0)
Hemoglobin: 8.8 g/dL — ABNORMAL LOW (ref 12.0–15.0)
Immature Granulocytes: 2 %
Lymphocytes Relative: 24 %
Lymphs Abs: 3.5 10*3/uL (ref 0.7–4.0)
MCH: 24.6 pg — ABNORMAL LOW (ref 26.0–34.0)
MCHC: 30.9 g/dL (ref 30.0–36.0)
MCV: 79.8 fL — ABNORMAL LOW (ref 80.0–100.0)
Monocytes Absolute: 0.9 10*3/uL (ref 0.1–1.0)
Monocytes Relative: 6 %
Neutro Abs: 10.1 10*3/uL — ABNORMAL HIGH (ref 1.7–7.7)
Neutrophils Relative %: 67 %
Platelets: 562 10*3/uL — ABNORMAL HIGH (ref 150–400)
RBC: 3.57 MIL/uL — ABNORMAL LOW (ref 3.87–5.11)
RDW: 15.1 % (ref 11.5–15.5)
WBC: 14.9 10*3/uL — ABNORMAL HIGH (ref 4.0–10.5)
nRBC: 0 % (ref 0.0–0.2)

## 2022-01-16 LAB — CULTURE, ROUTINE-GENITAL: Culture: NO GROWTH

## 2022-01-16 LAB — CERVICOVAGINAL ANCILLARY ONLY
Bacterial Vaginitis (gardnerella): NEGATIVE
Candida Glabrata: POSITIVE — AB
Candida Vaginitis: NEGATIVE
Chlamydia: NEGATIVE
Comment: NEGATIVE
Comment: NEGATIVE
Comment: NEGATIVE
Comment: NEGATIVE
Comment: NEGATIVE
Comment: NORMAL
Neisseria Gonorrhea: NEGATIVE
Trichomonas: NEGATIVE

## 2022-01-16 LAB — GLUCOSE, CAPILLARY
Glucose-Capillary: 123 mg/dL — ABNORMAL HIGH (ref 70–99)
Glucose-Capillary: 118 mg/dL — ABNORMAL HIGH (ref 70–99)
Glucose-Capillary: 178 mg/dL — ABNORMAL HIGH (ref 70–99)
Glucose-Capillary: 134 mg/dL — ABNORMAL HIGH (ref 70–99)
Glucose-Capillary: 134 mg/dL — ABNORMAL HIGH (ref 70–99)

## 2022-01-16 LAB — BASIC METABOLIC PANEL
Anion gap: 10 (ref 5–15)
BUN: 6 mg/dL (ref 6–20)
CO2: 23 mmol/L (ref 22–32)
Calcium: 9.4 mg/dL (ref 8.9–10.3)
Chloride: 105 mmol/L (ref 98–111)
Creatinine, Ser: 0.89 mg/dL (ref 0.44–1.00)
GFR, Estimated: >60 mL/min (ref >60)
Glucose, Bld: 120 mg/dL — ABNORMAL HIGH (ref 70–99)
Potassium: 3.9 mmol/L (ref 3.5–5.1)
Sodium: 138 mmol/L (ref 135–145)

## 2022-01-16 NOTE — Progress Notes (Signed)
Referring Physician(s): [place] Physician: Ruthann Cancer  Patient Status:  [facility] - In-pt  Chief Complaint:  Pelvic pain/fluid collection  Subjective: Pt doing ok; still has some discomfort at drain site RLQ; denies fever,N/V   Allergies: Patient has no known allergies.  Medications: Prior to Admission medications   Medication Sig Start Date End Date Taking? Authorizing Provider  acetaminophen (TYLENOL) 500 MG tablet Take 500 mg by mouth every 6 (six) hours as needed for moderate pain.   Yes [provider]  amoxicillin-clavulanate (AUGMENTIN) 875-125 MG tablet Take 1 tablet by mouth 2 (two) times daily. 12/30/21  Yes Governor Specking, MD  doxycycline (VIBRAMYCIN) 50 MG capsule Take 2 capsules (100 mg total) by mouth 2 (two) times daily. 12/30/21  Yes Governor Specking, MD  empagliflozin (JARDIANCE) 25 MG TABS tablet Take 25 mg by mouth daily.   Yes [provider]  ibuprofen (ADVIL) 200 MG tablet Take 400 mg by mouth every 6 (six) hours as needed for moderate pain.   Yes [provider]  metFORMIN (GLUCOPHAGE-XR) 500 MG 24 hr tablet Take 1,000 mg by mouth 2 (two) times daily with a meal.   Yes [provider]  rosuvastatin (CRESTOR) 40 MG tablet Take 40 mg by mouth daily.   Yes [provider]  traMADol (ULTRAM) 50 MG tablet Take 50 mg by mouth every 6 (six) hours as needed for moderate pain or severe pain. 01/10/22  Yes [provider]     Vital Signs: BP 123/82 (BP Location: Left Arm)   Pulse 88   Temp 98.6 F (37 C) (Oral)   Resp 16   Ht '5\' 2"'$  (1.575 m)   Wt 115 lb 4 oz (52.3 kg)   LMP 12/28/2021   SpO2 96%   BMI 21.08 kg/m   Physical Exam: awake,alert; RLQ drain intact, insertion site ok, mildly tender, OP 85 cc bloody fluid; drain flushed without difficulty  Imaging: CT guided needle placement  Result Date: 01/14/2022 INDICATION: Pelvic abscess; discussion with referring gyn revealed  that patient had undergone transvaginal procedure approximately 1 month prior to today's presentation, and had previous treatment for infection with antibiotics, now returns with leukocytosis and pelvic pain with high clinical concern for worsening infection. EXAM: CT-guided pelvic drain placement TECHNIQUE: Multidetector CT imaging of the abdomen and pelvis was performed following the standard protocol without IV contrast. RADIATION DOSE REDUCTION: This exam was performed according to the departmental dose-optimization program which includes automated exposure control, adjustment of the mA and/or kV according to patient size and/or use of iterative reconstruction technique. MEDICATIONS: The patient is currently admitted to the hospital and receiving intravenous antibiotics. The antibiotics were administered within an appropriate time frame prior to the initiation of the procedure. ANESTHESIA/SEDATION: Moderate (conscious) sedation was employed during this procedure. A total of Versed 2 mg and Fentanyl 100 mcg was administered intravenously by the radiology nurse. Total intra-service moderate Sedation Time: 20 minutes. The patient's level of consciousness and vital signs were monitored continuously by radiology nursing throughout the procedure under my direct supervision. COMPLICATIONS: None immediate. PROCEDURE: Informed written consent was obtained from the patient after a thorough discussion of the procedural risks, benefits and alternatives. All questions were addressed. Maximal Sterile Barrier Technique was utilized including caps, mask, sterile gowns, sterile gloves, sterile drape, hand hygiene and skin antiseptic. A timeout was performed prior to the initiation of the procedure. The patient was placed supine on the exam table. Limited CT of the abdomen and pelvis  was performed for planning purposes. This demonstrated complex pelvic collection. Skin entry site was marked, and the overlying skin was prepped and  draped in the standard sterile fashion. Local analgesia was obtained with 1% lidocaine. Using intermittent CT fluoroscopy, a 19 gauge Yueh catheter was advanced into the largest fluid component identified within the complex pelvic collection using a left anterolateral approach. Aspiration of the fluid yielded thick purulent material, which was difficult to aspirate via the Yueh catheter. Therefore, the decision was made to proceed with drain placement. An 035 wire was advanced through the access needle, over which the percutaneous tract was serially dilated to accommodate a 12 French locking drainage catheter. Location was confirmed with CT and return of additional purulent material. A sample was sent to the lab for microbiology analysis locking loop was formed, and the catheter was secured to the skin using silk suture and a dressing. It was attached to bulb suction. The patient tolerated the procedure well without immediate complication. IMPRESSION: Successful CT-guided placement of a 12 French locking drainage catheter into complex pelvic collection with return of purulent material. A sample was sent to the lab for microbiology analysis. Electronically Signed   By: Albin Felling M.D.   On: 01/14/2022 17:31   US Pelvis Complete  Result Date: 01/13/2022 CLINICAL DATA:  Pelvic pain. EXAM: TRANSABDOMINAL AND TRANSVAGINAL ULTRASOUND OF PELVIS DOPPLER ULTRASOUND OF OVARIES TECHNIQUE: Both transabdominal and transvaginal ultrasound examinations of the pelvis were performed. Transabdominal technique was performed for global imaging of the pelvis including uterus, ovaries, adnexal regions, and pelvic cul-de-sac. It was necessary to proceed with endovaginal exam following the transabdominal exam to visualize the endometrium and ovaries. Color and duplex Doppler ultrasound was utilized to evaluate blood flow to the ovaries. COMPARISON:  CT scan of same day.  Ultrasound of December 26, 2021. FINDINGS: Uterus  Measurements: 8.2 x 5.7 x 3.2 cm = volume: 78 mL. No fibroids or other mass visualized. Endometrium Not well visualized due to large right adnexal mass. Right ovary Measurements: 8.5 x 8.5 x 7.6 cm = volume: 287 mL. Large heterogeneous but predominantly solid mass is noted highly concerning for ovarian malignancy or neoplasm. Status post left oophorectomy. Pulsed Doppler evaluation of right ovary demonstrates normal low-resistance arterial and venous waveforms. Other findings No abnormal free fluid. IMPRESSION: Large heterogeneous but predominantly solid mass is noted in the right adnexal region highly concerning for right ovarian neoplasm or malignancy. MRI is recommended for further evaluation as well as gynecologic consultation. Status post left oophorectomy. No definite evidence of right ovarian torsion. Electronically Signed   By: Marijo Conception M.D.   On: 01/13/2022 14:42   US Transvaginal Non-OB  Result Date: 01/13/2022 CLINICAL DATA:  Pelvic pain. EXAM: TRANSABDOMINAL AND TRANSVAGINAL ULTRASOUND OF PELVIS DOPPLER ULTRASOUND OF OVARIES TECHNIQUE: Both transabdominal and transvaginal ultrasound examinations of the pelvis were performed. Transabdominal technique was performed for global imaging of the pelvis including uterus, ovaries, adnexal regions, and pelvic cul-de-sac. It was necessary to proceed with endovaginal exam following the transabdominal exam to visualize the endometrium and ovaries. Color and duplex Doppler ultrasound was utilized to evaluate blood flow to the ovaries. COMPARISON:  CT scan of same day.  Ultrasound of December 26, 2021. FINDINGS: Uterus Measurements: 8.2 x 5.7 x 3.2 cm = volume: 78 mL. No fibroids or other mass visualized. Endometrium Not well visualized due to large right adnexal mass. Right ovary Measurements: 8.5 x 8.5 x 7.6 cm = volume: 287 mL. Large heterogeneous but predominantly solid  mass is noted highly concerning for ovarian malignancy or neoplasm. Status post left  oophorectomy. Pulsed Doppler evaluation of right ovary demonstrates normal low-resistance arterial and venous waveforms. Other findings No abnormal free fluid. IMPRESSION: Large heterogeneous but predominantly solid mass is noted in the right adnexal region highly concerning for right ovarian neoplasm or malignancy. MRI is recommended for further evaluation as well as gynecologic consultation. Status post left oophorectomy. No definite evidence of right ovarian torsion. Electronically Signed   By: Marijo Conception M.D.   On: 01/13/2022 14:42   [place] Art/Ven Flow Abd Pelv Doppler  Result Date: 01/13/2022 CLINICAL DATA:  Pelvic pain. EXAM: TRANSABDOMINAL AND TRANSVAGINAL ULTRASOUND OF PELVIS DOPPLER ULTRASOUND OF OVARIES TECHNIQUE: Both transabdominal and transvaginal ultrasound examinations of the pelvis were performed. Transabdominal technique was performed for global imaging of the pelvis including uterus, ovaries, adnexal regions, and pelvic cul-de-sac. It was necessary to proceed with endovaginal exam following the transabdominal exam to visualize the endometrium and ovaries. Color and duplex Doppler ultrasound was utilized to evaluate blood flow to the ovaries. COMPARISON:  CT scan of same day.  Ultrasound of December 26, 2021. FINDINGS: Uterus Measurements: 8.2 x 5.7 x 3.2 cm = volume: 78 mL. No fibroids or other mass visualized. Endometrium Not well visualized due to large right adnexal mass. Right ovary Measurements: 8.5 x 8.5 x 7.6 cm = volume: 287 mL. Large heterogeneous but predominantly solid mass is noted highly concerning for ovarian malignancy or neoplasm. Status post left oophorectomy. Pulsed Doppler evaluation of right ovary demonstrates normal low-resistance arterial and venous waveforms. Other findings No abnormal free fluid. IMPRESSION: Large heterogeneous but predominantly solid mass is noted in the right adnexal region highly concerning for right ovarian neoplasm or malignancy. MRI is  recommended for further evaluation as well as gynecologic consultation. Status post left oophorectomy. No definite evidence of right ovarian torsion. Electronically Signed   By: Marijo Conception M.D.   On: 01/13/2022 14:42   CT ABDOMEN PELVIS W CONTRAST  Result Date: 01/13/2022 CLINICAL DATA:  Abdominal pain.  History of endometriosis. EXAM: CT ABDOMEN AND PELVIS WITH CONTRAST TECHNIQUE: Multidetector CT imaging of the abdomen and pelvis was performed using the standard protocol following bolus administration of intravenous contrast. RADIATION DOSE REDUCTION: This exam was performed according to the departmental dose-optimization program which includes automated exposure control, adjustment of the mA and/or kV according to patient size and/or use of iterative reconstruction technique. CONTRAST:  100 mL OMNIPAQUE IOHEXOL 300 MG/ML  SOLN COMPARISON:  09/21/2014 FINDINGS: Lower chest: No acute abnormality. Hepatobiliary: Medial right lobe hypodensity consistent with focal fatty infiltration. Otherwise no hepatic lesions identified and no intrahepatic biliary ductal dilatation. No gallstones, gallbladder wall thickening, or extrahepatic biliary dilatation. Pancreas: Unremarkable. No pancreatic ductal dilatation or surrounding inflammatory changes. Spleen: Normal in size without focal abnormality. Adrenals/Urinary Tract: There are no precontrast images which limits the ability to assess for the presence of stones. Left kidney demonstrates no hydronephrosis. On the right there is hydronephrosis and hydroureter presumably on the basis of complex mass in the pelvis. Urinary bladder is displaced but otherwise unremarkable. Stomach/Bowel: Stomach is within normal limits. Appendix appears normal. No evidence of bowel wall thickening, distention, or inflammatory changes. Thickening of the descending and sigmoid colon likely on the basis of normal peristalsis without significant pericolonic inflammatory change identified.  Vascular/Lymphatic: No significant vascular findings are present. No enlarged abdominal or pelvic lymph nodes. Reproductive: Uterus is displaced by a right adnexal complex cystic peripherally enhancing multiloculated lesion  measuring 13 x 13 x 10 cm. Presumably this represents sequelae of patient's history of endometriosis. Other etiologies like neoplasm or abscess are not excluded on the basis of this examination. Other: No abdominal wall hernia or abnormality. No abdominopelvic ascites. Musculoskeletal: No acute or significant osseous findings. IMPRESSION: 1. Large cystic, multi loculated and peripherally and centrally enhancing process in the right adnexa presumably related to patient's history of endometriosis. Differential would include neoplasm or abscess. 2. Right-sided mild hydronephrosis and hydroureter that appears to be in the basis of mass effect from the right adnexal process. Electronically Signed   By: Sammie Bench M.D.   On: 01/13/2022 14:42    Labs:  CBC: Recent Labs    01/14/22 1236 01/15/22 0417 01/15/22 1239 01/16/22 0319  WBC 20.0* 14.2* 13.0* 14.9*  HGB 8.4* 7.9* 8.0* 8.8*  HCT 28.5* 24.8* 26.1* 28.5*  PLT 583* 477* 513* 562*    COAGS: Recent Labs    12/26/21 1058  INR 1.4*  APTT 33    BMP: Recent Labs    01/13/22 0921 01/14/22 1236 01/15/22 0417 01/16/22 0319  NA 131* 139 136 138  K 4.1 3.6 3.2* 3.9  CL 94* 107 105 105  CO2 27 21* 23 23  GLUCOSE 277* 121* 151* 120*  BUN '10 9 6 6  '$ CALCIUM 11.2* 9.0 8.7* 9.4  CREATININE 0.62 0.68 0.63 0.89  GFRNONAA >60 >60 >60 >60    LIVER FUNCTION TESTS: Recent Labs    12/26/21 1058 01/13/22 0921 01/14/22 1236  BILITOT 1.4* 0.7 0.6  AST 41 70* 23  ALT 59* 97* 57*  ALKPHOS 347* 810* 518*  PROT 7.3 10.0* 7.7  ALBUMIN 2.4* 3.8 2.6*    Assessment and Plan: Pt with hx large TOA / transvaginal drainage of endometriomas; now with pelvic fluid collection, s/p drain placement 12/2 (11f to JP); afebrile; WBC  14.9(13), hgb 8.8(8), creat nl; drain fl cx neg to date; cont with drain irrigation, close output monitoring, lab checks; obtain f/u CT once output minimal or if WBC cont to increase   Electronically Signed: D. KRowe Robert PA-C 01/16/2022, 11:06 AM   I spent a total of 15 Minutes at the the patient's bedside AND on the patient's hospital floor or unit, greater than 50% of which was counseling/coordinating care for pelvic fluid collection drain    Patient ID: Marie Hill female   DOB: 1Nov 26, 1982 41y.o.   MRN: [id number]

## 2022-01-16 NOTE — Progress Notes (Addendum)
Gynecologic Oncology Progress Note  Premenopausal patient with known history of severe endometriosis referred to GYN ONC after transvaginal aspiration of small endometrioma resulted in what is believed to be tubo-ovarian abscess currently admitted to the hospital for IV antibiotics s/p CT drain placement on 01/14/2022.  Subjective: Patient reports feeling better this morning.  She feels she is gaining some of her strength back and feels her voice sounds stronger.  No nausea or emesis reported.  She is beginning to increase her appetite but states that her taste buds are still off.  She reports having tolerable abdominal pain, different from the pain she had with the infection.  She now reports the pain as related to soreness from the drain and stiffness in the right lower abdomen.  She is voiding without difficulty.  She had a small bowel movement this morning.  She has only had 1 episode of vaginal bleeding noted during urination yesterday and she has seen nothing since.  She reports seeing a color change in the drain output.  She denies chest pain, dyspnea.  She feels her overall weakness is improving.  Husband at the bedside.  No needs or concerns voiced.  Objective: Vital signs in last 24 hours: Temp:  [98 F (36.7 C)-98.6 F (37 C)] 98.6 F (37 C) (12/04 8466) Pulse Rate:  [86-98] 88 (12/04 0632) Resp:  [16] 16 (12/04 5993) BP: (114-136)/(81-87) 123/82 (12/04 5701) SpO2:  [95 %-99 %] 96 % (12/04 7793) Last BM Date : 01/14/22  Intake/Output from previous day: 12/03 0701 - 12/04 0700 In: 1100.8 [P.O.:800; I.V.:5; IV Piggyback:290.8] Out: 2283 [Urine:2200; Drains:83]  Physical Examination: General: alert, cooperative, and no distress Resp: clear to auscultation bilaterally Cardio: regular rate and rhythm, S1, S2 normal, no murmur, click, rub or gallop GI: soft, non-tender; bowel sounds normal; no masses,  no organomegaly Extremities: extremities normal, atraumatic, no cyanosis or  edema Right abdominal drain with dark bloody drainage. Dressing intact.  Labs: WBC/Hgb/Hct/Plts:  14.9/8.8/28.5/562 (12/04 0319) BUN/Cr/glu/ALT/AST/amyl/lip:  6/0.89/--/--/--/--/-- (12/04 0319)  Assessment: 41 y.o. with large TOA following imaged-guided transvaginal drainage of endometriomas s/p CT guided drain placement on 01/14/22 on IV antibiotics for suspected tubo-ovarian abscess: stable and improving  Pain:  Pain is well-controlled on PRN medications.  Heme: Hgb 8.8 and Hct 28.5 this am. Continue to monitor. Asymptomatic.  ID: WBC 14.9 this am. Slightly increased from 12/3. Afebrile and pt reporting improvement. Plan to continue with current regimen of zosyn with repeat labs in the am.  CV: BP and HR stable. Continue to monitor.  GI:  Tolerating po: improving. Antiemetics ordered as needed.  GU: Creatinine 0.89 this am. Voiding without difficulty.   FEN: No critical values on am Bmet.  Endo: Diabetes mellitus Type II, under good control.  CBG: CBG (last 3)  Recent Labs    01/16/22 0325 01/16/22 0811 01/16/22 1133  GLUCAP 123* 118* 178*     Prophylaxis: Lovenox ordered.  Plan per Dr. Berline Lopes: Awaiting results of culture from fluid drained during CT guided drain placement. This will hopefully help to guide therapy along with antibiotic selection at discharge (need for IV vs transition to PO) per Dr. Berline Lopes Will need outpatient IR follow up Continue drain flushes per IR   LOS: 3 days    Hellen Shanley D Davontay Watlington 01/16/2022, 11:37 AM

## 2022-01-16 NOTE — Progress Notes (Signed)
Hospital day #4  Marie Hill is a 41 y.o. female patient, feeling better after CT-guided 12 French drain placement for right tubo-ovarian abscess drainage since Saturday.  Patient has been afebrile after drainage of large amount of purulent discharge.  Current drainage is serosanguineous. 1. Ovarian mass, right   2. 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   no meds 1997, treated in June 2016 x 1 month- states neg chest x ray June 2016   Current Facility-Administered Medications  Medication Dose Route Frequency Provider Last Rate Last Admin   empagliflozin (JARDIANCE) tablet 25 mg  25 mg Oral Daily Lahoma Crocker, MD   25 mg at 01/16/22 0901   enoxaparin (LOVENOX) injection 40 mg  40 mg Subcutaneous Q24H Governor Specking, MD   40 mg at 01/16/22 1637   feeding supplement (GLUCERNA SHAKE) (GLUCERNA SHAKE) liquid 237 mL  237 mL Oral TID BM Lahoma Crocker, MD   237 mL at 01/16/22 1951   ibuprofen (ADVIL) tablet 600 mg  600 mg Oral QID PRN Lahoma Crocker, MD       insulin aspart (novoLOG) injection 0-5 Units  0-5 Units Subcutaneous QHS Lahoma Crocker, MD       insulin aspart (novoLOG) injection 0-9 Units  0-9 Units Subcutaneous TID WC Lahoma Crocker, MD   1 Units at 01/16/22 1638   metFORMIN (GLUCOPHAGE-XR) 24 hr tablet 1,000 mg  1,000 mg Oral BID WC Lahoma Crocker, MD   1,000 mg at 01/16/22 1638   morphine (PF) 2 MG/ML injection 2 mg  2 mg Intravenous Q4H PRN Lahoma Crocker, MD       piperacillin-tazobactam (ZOSYN) IVPB 3.375 g  3.375 g Intravenous Q8H Lahoma Crocker, MD 12.5 mL/hr at  01/16/22 1453 3.375 g at 01/16/22 1453   potassium chloride SA (KLOR-CON M) CR tablet 20 mEq  20 mEq Oral BID Lahoma Crocker, MD   20 mEq at 01/16/22 2134   rosuvastatin (CRESTOR) tablet 40 mg  40 mg Oral Daily Lahoma Crocker, MD   40 mg at 01/16/22 0901   sodium chloride flush (NS) 0.9 % injection 5 mL  5 mL Intracatheter Q8H El-Abd, Joesph Fillers, MD       sodium chloride flush (NS) 0.9 % injection 5 mL  5 mL Intracatheter Q8H Ardis Rowan, PA-C   5 mL at 01/16/22 2134   traMADol (ULTRAM) tablet 50 mg  50 mg Oral Q6H PRN Lahoma Crocker, MD   50 mg at 01/16/22 2133   No Known Allergies  Blood pressure 119/89, pulse 88, temperature 98.3 F (36.8 C), temperature source Oral, resp. rate 18, height '5\' 2"'$  (1.575 m), weight 52.3 kg, last menstrual period 12/28/2021, SpO2 97 %. Abdomen is soft, nontender, drain site clear.  Serosanguineous fluid from the lower abdominal 12 French drain.  Assessment & Plan Tubo-ovarian abscess, resolving after CT-guided drain placement and on IV antibiotics. Cultures pending Will continue IV antibiotics and will await IR recommendation in order to discharge patient.  [name] 01/16/2022

## 2022-01-17 LAB — CBC WITH DIFFERENTIAL/PLATELET
Abs Immature Granulocytes: 0.24 10*3/uL — ABNORMAL HIGH (ref 0.00–0.07)
Basophils Absolute: 0 10*3/uL (ref 0.0–0.1)
Basophils Relative: 0 %
Eosinophils Absolute: 0.1 10*3/uL (ref 0.0–0.5)
Eosinophils Relative: 1 %
HCT: 28.3 % — ABNORMAL LOW (ref 36.0–46.0)
Hemoglobin: 8.7 g/dL — ABNORMAL LOW (ref 12.0–15.0)
Immature Granulocytes: 2 %
Lymphocytes Relative: 20 %
Lymphs Abs: 2.5 10*3/uL (ref 0.7–4.0)
MCH: 24.4 pg — ABNORMAL LOW (ref 26.0–34.0)
MCHC: 30.7 g/dL (ref 30.0–36.0)
MCV: 79.5 fL — ABNORMAL LOW (ref 80.0–100.0)
Monocytes Absolute: 0.8 10*3/uL (ref 0.1–1.0)
Monocytes Relative: 6 %
Neutro Abs: 8.7 10*3/uL — ABNORMAL HIGH (ref 1.7–7.7)
Neutrophils Relative %: 71 %
Platelets: 545 10*3/uL — ABNORMAL HIGH (ref 150–400)
RBC: 3.56 MIL/uL — ABNORMAL LOW (ref 3.87–5.11)
RDW: 14.9 % (ref 11.5–15.5)
WBC: 12.4 10*3/uL — ABNORMAL HIGH (ref 4.0–10.5)
nRBC: 0 % (ref 0.0–0.2)

## 2022-01-17 LAB — GLUCOSE, CAPILLARY
Glucose-Capillary: 108 mg/dL — ABNORMAL HIGH (ref 70–99)
Glucose-Capillary: 125 mg/dL — ABNORMAL HIGH (ref 70–99)
Glucose-Capillary: 136 mg/dL — ABNORMAL HIGH (ref 70–99)
Glucose-Capillary: 137 mg/dL — ABNORMAL HIGH (ref 70–99)

## 2022-01-17 LAB — BASIC METABOLIC PANEL
Anion gap: 11 (ref 5–15)
BUN: 8 mg/dL (ref 6–20)
CO2: 23 mmol/L (ref 22–32)
Calcium: 9.5 mg/dL (ref 8.9–10.3)
Chloride: 102 mmol/L (ref 98–111)
Creatinine, Ser: 0.7 mg/dL (ref 0.44–1.00)
GFR, Estimated: >60 mL/min (ref >60)
Glucose, Bld: 104 mg/dL — ABNORMAL HIGH (ref 70–99)
Potassium: 4 mmol/L (ref 3.5–5.1)
Sodium: 136 mmol/L (ref 135–145)

## 2022-01-17 MED ORDER — LEVOFLOXACIN 500 MG PO TABS
750.0000 mg | ORAL_TABLET | Freq: Every day | ORAL | Status: DC
  Administered 2022-01-17: 750 mg via ORAL
  Filled 2022-01-17: qty 2

## 2022-01-17 MED ORDER — METRONIDAZOLE 500 MG PO TABS
500.0000 mg | ORAL_TABLET | Freq: Two times a day (BID) | ORAL | Status: DC
  Administered 2022-01-17: 500 mg via ORAL
  Filled 2022-01-17: qty 1

## 2022-01-17 MED ORDER — ONDANSETRON 4 MG PO TBDP
4.0000 mg | ORAL_TABLET | Freq: Three times a day (TID) | ORAL | Status: DC | PRN
  Administered 2022-01-17: 4 mg via ORAL
  Filled 2022-01-17: qty 1

## 2022-01-17 NOTE — Progress Notes (Signed)
Gynecologic Oncology Progress Note  Premenopausal patient with known history of severe endometriosis referred to GYN ONC after transvaginal aspiration of small endometrioma resulted in what is believed to be tubo-ovarian abscess currently admitted to the hospital for IV antibiotics s/p CT drain placement on 01/14/2022.  Subjective: Patient reports continued improvement. Her appetite is increasing and taste of foods improving. She is drinking the supplemental shakes. No nausea or emesis reported. She describes her abdominal discomfort as light and manageable. She feels her abdominal distension has improved. She is voiding without difficulty.  She had a loose bowel movement this morning. Has ambulated back and forth to the bathroom and in the hall yesterday and tolerated this well. She feels her overall weakness is improving.  Husband and sister at the bedside.  No needs or concerns voiced.  Objective: Vital signs in last 24 hours: Temp:  [98.1 F (36.7 C)-98.6 F (37 C)] 98.1 F (36.7 C) (12/05 0612) Pulse Rate:  [79-96] 96 (12/05 0612) Resp:  [18] 18 (12/05 0612) BP: (118-134)/(77-89) 134/86 (12/05 0612) SpO2:  [97 %-98 %] 97 % (12/05 0612) Last BM Date : 01/16/22  Intake/Output from previous day: 12/04 0701 - 12/05 0700 In: 50 [P.O.:580; IV Piggyback:139] Out: 2910 [Urine:2800; Drains:110]  Physical Examination: General: alert, cooperative, and no distress Resp: clear to auscultation bilaterally Cardio: regular rate and rhythm, S1, S2 normal, no murmur, click, rub or gallop GI: soft, non-tender; bowel sounds normal; no masses,  no organomegaly Extremities: extremities normal, atraumatic, no cyanosis or edema Right abdominal drain with dark bloody drainage. Dressing intact. SCDs on  Labs: WBC/Hgb/Hct/Plts:  12.4/8.7/28.3/545 (12/05 0451) BUN/Cr/glu/ALT/AST/amyl/lip:  8/0.70/--/--/--/--/-- (12/05 0451)  Assessment: 41 y.o. with large TOA following imaged-guided transvaginal  drainage of endometriomas s/p CT guided drain placement on 01/14/22 on IV antibiotics for suspected tubo-ovarian abscess: stable and improving  Pain:  Pain is well-controlled on PRN medications.  Heme: Hgb 8.7 this am from 8.8 and Hct 28.3 this am from 28.5. Continue to monitor. Values stable and pt asymptomatic.  ID: WBC 12.4 this am from 14.9. Afebrile and pt reporting improvement. Plan to transition to oral antibiotics per pharm recommendation today and to monitor for 24 hours after with repeat labs in the am.  CV: BP and HR stable. Continue to monitor.  GI:  Tolerating po: improving. Antiemetics ordered as needed.  GU: Creatinine 0.70 this am from 0.89. Voiding without difficulty. Adequate output reported.  FEN: No critical values on am Bmet.  Endo: Diabetes mellitus Type II, under good control.  CBG: CBG (last 3)  Recent Labs    01/16/22 1621 01/16/22 2116 01/17/22 0749  GLUCAP 134* 134* 108*     Prophylaxis: Lovenox ordered.  Plan per Dr. Berline Lopes: Preliminary culture results from 01/14/22 with few gram + cocci and rare gram neg rods being held for possible anaerobe. Given recent augmentin use, results of culture may be limited. Per Dr. Berline Lopes, plan to transition to oral antibiotic regimen today to cover aerobic and anaerobic (will consult pharm) then monitor for 24 hours after with repeat labs in the am.  If doing well tomorrow afternoon, could be discharged home.  Will need outpatient IR follow up Continue drain flushes per IR   LOS: 4 days    Shavaughn Seidl D Shiana Rappleye 01/17/2022, 8:59 AM

## 2022-01-17 NOTE — Progress Notes (Signed)
GYN Oncology Progress Note  Per Heide Guile, ID pharmacist, recommendations for oral antibiotics based on preliminary culture results could be augmentin or a metronidazole regimen with a quinolone, like metronidazole '500mg'$  po BID + levofloxacin '750mg'$  po daily.  Per Dr. Berline Lopes, plan for flagyl/ levaquin PO regimen to begin tonight.

## 2022-01-17 NOTE — Progress Notes (Signed)
Referring Physician(s): Jackson-Moore,L/Tucker,K  Supervising Physician: Aletta Edouard  Patient Status:  [facility] - In-pt  Chief Complaint:  Pelvic pain/abscess  Subjective: Pt states that she feels better today, less pelvic pain   Allergies: Patient has no known allergies.  Medications: Prior to Admission medications   Medication Sig Start Date End Date Taking? Authorizing Provider  acetaminophen (TYLENOL) 500 MG tablet Take 500 mg by mouth every 6 (six) hours as needed for moderate pain.   Yes [provider]  amoxicillin-clavulanate (AUGMENTIN) 875-125 MG tablet Take 1 tablet by mouth 2 (two) times daily. 12/30/21  Yes Governor Specking, MD  doxycycline (VIBRAMYCIN) 50 MG capsule Take 2 capsules (100 mg total) by mouth 2 (two) times daily. 12/30/21  Yes Governor Specking, MD  empagliflozin (JARDIANCE) 25 MG TABS tablet Take 25 mg by mouth daily.   Yes [provider]  ibuprofen (ADVIL) 200 MG tablet Take 400 mg by mouth every 6 (six) hours as needed for moderate pain.   Yes [provider]  metFORMIN (GLUCOPHAGE-XR) 500 MG 24 hr tablet Take 1,000 mg by mouth 2 (two) times daily with a meal.   Yes [provider]  rosuvastatin (CRESTOR) 40 MG tablet Take 40 mg by mouth daily.   Yes [provider]  traMADol (ULTRAM) 50 MG tablet Take 50 mg by mouth every 6 (six) hours as needed for moderate pain or severe pain. 01/10/22  Yes [provider]     Vital Signs: BP 117/79 (BP Location: Left Arm)   Pulse 92   Temp 98 F (36.7 C) (Oral)   Resp 18   Ht '5\' 2"'$  (1.575 m)   Wt 115 lb 4 oz (52.3 kg)   LMP 12/28/2021   SpO2 96%   BMI 21.08 kg/m   Physical Exam awake/alert; RLQ drain intact, insertion site ok, mildly tender, OP 110 cc yesterday, 20 cc today blood-tinged fluid; drain flushed earlier  Imaging: CT guided needle placement  Result Date: 01/14/2022 INDICATION: Pelvic abscess; discussion with referring gyn  revealed that patient had undergone transvaginal procedure approximately 1 month prior to today's presentation, and had previous treatment for infection with antibiotics, now returns with leukocytosis and pelvic pain with high clinical concern for worsening infection. EXAM: CT-guided pelvic drain placement TECHNIQUE: Multidetector CT imaging of the abdomen and pelvis was performed following the standard protocol without IV contrast. RADIATION DOSE REDUCTION: This exam was performed according to the departmental dose-optimization program which includes automated exposure control, adjustment of the mA and/or kV according to patient size and/or use of iterative reconstruction technique. MEDICATIONS: The patient is currently admitted to the hospital and receiving intravenous antibiotics. The antibiotics were administered within an appropriate time frame prior to the initiation of the procedure. ANESTHESIA/SEDATION: Moderate (conscious) sedation was employed during this procedure. A total of Versed 2 mg and Fentanyl 100 mcg was administered intravenously by the radiology nurse. Total intra-service moderate Sedation Time: 20 minutes. The patient's level of consciousness and vital signs were monitored continuously by radiology nursing throughout the procedure under my direct supervision. COMPLICATIONS: None immediate. PROCEDURE: Informed written consent was obtained from the patient after a thorough discussion of the procedural risks, benefits and alternatives. All questions were addressed. Maximal Sterile Barrier Technique was utilized including caps, mask, sterile gowns, sterile gloves, sterile drape, hand hygiene and skin antiseptic. A timeout was performed prior to the initiation of the procedure. The patient was placed supine on the exam table. Limited CT of the abdomen and pelvis was  performed for planning purposes. This demonstrated complex pelvic collection. Skin entry site was marked, and the overlying skin was  prepped and draped in the standard sterile fashion. Local analgesia was obtained with 1% lidocaine. Using intermittent CT fluoroscopy, a 19 gauge Yueh catheter was advanced into the largest fluid component identified within the complex pelvic collection using a left anterolateral approach. Aspiration of the fluid yielded thick purulent material, which was difficult to aspirate via the Yueh catheter. Therefore, the decision was made to proceed with drain placement. An 035 wire was advanced through the access needle, over which the percutaneous tract was serially dilated to accommodate a 12 French locking drainage catheter. Location was confirmed with CT and return of additional purulent material. A sample was sent to the lab for microbiology analysis locking loop was formed, and the catheter was secured to the skin using silk suture and a dressing. It was attached to bulb suction. The patient tolerated the procedure well without immediate complication. IMPRESSION: Successful CT-guided placement of a 12 French locking drainage catheter into complex pelvic collection with return of purulent material. A sample was sent to the lab for microbiology analysis. Electronically Signed   By: Albin Felling M.D.   On: 01/14/2022 17:31    Labs:  CBC: Recent Labs    01/15/22 0417 01/15/22 1239 01/16/22 0319 01/17/22 0451  WBC 14.2* 13.0* 14.9* 12.4*  HGB 7.9* 8.0* 8.8* 8.7*  HCT 24.8* 26.1* 28.5* 28.3*  PLT 477* 513* 562* 545*    COAGS: Recent Labs    12/26/21 1058  INR 1.4*  APTT 33    BMP: Recent Labs    01/14/22 1236 01/15/22 0417 01/16/22 0319 01/17/22 0451  NA 139 136 138 136  K 3.6 3.2* 3.9 4.0  CL 107 105 105 102  CO2 21* '23 23 23  '$ GLUCOSE 121* 151* 120* 104*  BUN '9 6 6 8  '$ CALCIUM 9.0 8.7* 9.4 9.5  CREATININE 0.68 0.63 0.89 0.70  GFRNONAA >60 >60 >60 >60    LIVER FUNCTION TESTS: Recent Labs    12/26/21 1058 01/13/22 0921 01/14/22 1236  BILITOT 1.4* 0.7 0.6  AST 41 70* 23   ALT 59* 97* 57*  ALKPHOS 347* 810* 518*  PROT 7.3 10.0* 7.7  ALBUMIN 2.4* 3.8 2.6*    Assessment and Plan: Pt with hx large TOA / transvaginal drainage of endometriomas; now with pelvic fluid collection, s/p drain placement 12/2 (47f to JP); afebrile; WBC 12.4(14.9), hgb 8.7(8.8), creat nl; drain fl cx- prevotella bivia; will check f/u CT tomorrow as pt being considered for dc   Electronically Signed: D. KRowe Robert PA-C 01/17/2022, 3:46 PM   I spent a total of 15 Minutes at the the patient's bedside AND on the patient's hospital floor or unit, greater than 50% of which was counseling/coordinating care for pelvic abscess drain    Patient ID: [id number] Alley female   DOB: [birth year] 41y.o.   MRN: [id number]

## 2022-01-17 NOTE — Progress Notes (Signed)
Verbal order by MD Delsa Sale for Zofran ODT '4mg'$  every 6 hours. Place order for every 8 hours, every 6 hours was not available.

## 2022-01-18 ENCOUNTER — Inpatient Hospital Stay (HOSPITAL_COMMUNITY): Payer: Commercial Managed Care - HMO

## 2022-01-18 LAB — CBC WITH DIFFERENTIAL/PLATELET
Abs Immature Granulocytes: 0.29 10*3/uL — ABNORMAL HIGH (ref 0.00–0.07)
Basophils Absolute: 0.1 10*3/uL (ref 0.0–0.1)
Basophils Relative: 0 %
Eosinophils Absolute: 0.2 10*3/uL (ref 0.0–0.5)
Eosinophils Relative: 2 %
HCT: 29.7 % — ABNORMAL LOW (ref 36.0–46.0)
Hemoglobin: 9.2 g/dL — ABNORMAL LOW (ref 12.0–15.0)
Immature Granulocytes: 3 %
Lymphocytes Relative: 21 %
Lymphs Abs: 2.4 10*3/uL (ref 0.7–4.0)
MCH: 24.7 pg — ABNORMAL LOW (ref 26.0–34.0)
MCHC: 31 g/dL (ref 30.0–36.0)
MCV: 79.6 fL — ABNORMAL LOW (ref 80.0–100.0)
Monocytes Absolute: 0.8 10*3/uL (ref 0.1–1.0)
Monocytes Relative: 7 %
Neutro Abs: 7.7 10*3/uL (ref 1.7–7.7)
Neutrophils Relative %: 67 %
Platelets: 575 10*3/uL — ABNORMAL HIGH (ref 150–400)
RBC: 3.73 MIL/uL — ABNORMAL LOW (ref 3.87–5.11)
RDW: 15 % (ref 11.5–15.5)
WBC: 11.4 10*3/uL — ABNORMAL HIGH (ref 4.0–10.5)
nRBC: 0.2 % (ref 0.0–0.2)

## 2022-01-18 LAB — CULTURE, BLOOD (SINGLE)
Culture: NO GROWTH
Culture: NO GROWTH
Special Requests: ADEQUATE
Special Requests: ADEQUATE

## 2022-01-18 LAB — AEROBIC/ANAEROBIC CULTURE W GRAM STAIN (SURGICAL/DEEP WOUND)

## 2022-01-18 LAB — GLUCOSE, CAPILLARY
Glucose-Capillary: 103 mg/dL — ABNORMAL HIGH (ref 70–99)
Glucose-Capillary: 105 mg/dL — ABNORMAL HIGH (ref 70–99)
Glucose-Capillary: 132 mg/dL — ABNORMAL HIGH (ref 70–99)
Glucose-Capillary: 144 mg/dL — ABNORMAL HIGH (ref 70–99)

## 2022-01-18 LAB — BASIC METABOLIC PANEL
Anion gap: 13 (ref 5–15)
BUN: 8 mg/dL (ref 6–20)
CO2: 22 mmol/L (ref 22–32)
Calcium: 9.5 mg/dL (ref 8.9–10.3)
Chloride: 99 mmol/L (ref 98–111)
Creatinine, Ser: 0.52 mg/dL (ref 0.44–1.00)
GFR, Estimated: >60 mL/min (ref >60)
Glucose, Bld: 105 mg/dL — ABNORMAL HIGH (ref 70–99)
Potassium: 3.9 mmol/L (ref 3.5–5.1)
Sodium: 134 mmol/L — ABNORMAL LOW (ref 135–145)

## 2022-01-18 MED ORDER — POLYETHYLENE GLYCOL 3350 17 G PO PACK
17.0000 g | PACK | Freq: Every day | ORAL | Status: DC
  Administered 2022-01-18 – 2022-01-20 (×3): 17 g via ORAL
  Filled 2022-01-18 (×3): qty 1

## 2022-01-18 MED ORDER — IOHEXOL 300 MG/ML  SOLN
80.0000 mL | Freq: Once | INTRAMUSCULAR | Status: AC | PRN
  Administered 2022-01-18: 80 mL via INTRAVENOUS

## 2022-01-18 MED ORDER — AMOXICILLIN-POT CLAVULANATE 875-125 MG PO TABS
1.0000 | ORAL_TABLET | Freq: Two times a day (BID) | ORAL | Status: DC
  Administered 2022-01-18 (×2): 1 via ORAL
  Filled 2022-01-18 (×2): qty 1

## 2022-01-18 MED ORDER — SODIUM CHLORIDE (PF) 0.9 % IJ SOLN
INTRAMUSCULAR | Status: AC
  Filled 2022-01-18: qty 50

## 2022-01-18 MED ORDER — SENNOSIDES-DOCUSATE SODIUM 8.6-50 MG PO TABS
2.0000 | ORAL_TABLET | Freq: Every day | ORAL | Status: DC
  Administered 2022-01-19: 2 via ORAL
  Filled 2022-01-18 (×2): qty 2

## 2022-01-18 NOTE — Progress Notes (Signed)
Gynecologic Oncology Progress note  Patient alert, oriented, laying in bed. Reports improvement in nausea but states she still cannot eat much. She has only been able to eat a small amount of lettuce and a cucumber today. Reporting mild itching around the drain site and states there has not been a significant amount of drainage noted today. Patient states she feels ok but visually appears like she does not feel well. Will plan to continue monitoring overnight.

## 2022-01-18 NOTE — Progress Notes (Signed)
Hospital day #6  Marie Hill is a 41 y.o. female patient. 1. Tubo-ovarian abscess   2. Ovarian mass, right   3. Sepsis, due to unspecified organism, unspecified whether acute organ dysfunction present ([place])   4. 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   no meds 1997, treated in June 2016 x 1 month- states neg chest x ray June 2016   Current Facility-Administered Medications  Medication Dose Route Frequency Provider Last Rate Last Admin   amoxicillin-clavulanate (AUGMENTIN) 875-125 MG per tablet 1 tablet  1 tablet Oral Q12H Cross, Melissa D, NP   1 tablet at 01/18/22 1055   empagliflozin (JARDIANCE) tablet 25 mg  25 mg Oral Daily Lahoma Crocker, MD   25 mg at 01/18/22 1055   enoxaparin (LOVENOX) injection 40 mg  40 mg Subcutaneous Q24H Governor Specking, MD   40 mg at 01/18/22 1530   feeding supplement (GLUCERNA SHAKE) (GLUCERNA SHAKE) liquid 237 mL  237 mL Oral TID BM Lahoma Crocker, MD   237 mL at 01/17/22 1308   ibuprofen (ADVIL) tablet 600 mg  600 mg Oral QID PRN Lahoma Crocker, MD       insulin aspart (novoLOG) injection 0-5 Units  0-5 Units Subcutaneous QHS Lahoma Crocker, MD       insulin aspart (novoLOG) injection 0-9 Units  0-9 Units Subcutaneous TID WC Lahoma Crocker, MD   1 Units at 01/18/22 1800   metFORMIN (GLUCOPHAGE-XR) 24 hr tablet 1,000 mg  1,000 mg Oral BID WC Lahoma Crocker, MD   1,000 mg at 01/18/22 1823   morphine (PF) 2 MG/ML injection 2 mg  2 mg Intravenous Q4H PRN Lahoma Crocker, MD       ondansetron (ZOFRAN-ODT) disintegrating tablet 4 mg  4 mg Oral Q8H PRN Lahoma Crocker, MD   4 mg at 01/17/22 1937    polyethylene glycol (MIRALAX / GLYCOLAX) packet 17 g  17 g Oral Daily Cross, Melissa D, NP   17 g at 01/18/22 1530   rosuvastatin (CRESTOR) tablet 40 mg  40 mg Oral Daily Lahoma Crocker, MD   40 mg at 01/18/22 1055   senna-docusate (Senokot-S) tablet 2 tablet  2 tablet Oral QHS Cross, Melissa D, NP       sodium chloride (PF) 0.9 % injection            sodium chloride flush (NS) 0.9 % injection 5 mL  5 mL Intracatheter Q8H El-Abd, Joesph Fillers, MD   5 mL at 01/18/22 1400   sodium chloride flush (NS) 0.9 % injection 5 mL  5 mL Intracatheter Q8H Ardis Rowan, PA-C   5 mL at 01/17/22 2135   traMADol (ULTRAM) tablet 50 mg  50 mg Oral Q6H PRN Lahoma Crocker, MD   50 mg at 01/18/22 1830   No Known Allergies Principal Problem:   Ovarian cystic mass  Blood pressure 119/78, pulse 88, temperature 98.5 F (36.9 C), temperature source Oral, resp. rate 18, height '5\' 2"'$  (1.575 m), weight 52.3 kg, last menstrual period 12/28/2021, SpO2 96 %.  Subjective Patient feels better, not requiring narcotic analgesics.  Levaquin and Flagyl made her nauseous, now  antibiotics have been switched to Augmentin p.o. from IV. Objective: Vital signs: (most recent): Blood pressure 119/78, pulse 88, temperature 98.5 F (36.9 C), temperature source Oral, resp. rate 18, height '5\' 2"'$  (1.575 m), weight 52.3 kg, last menstrual period 12/28/2021, SpO2 96 %.   Abdomen: Soft, nontender, no rebound tenderness.  No HSM. Right lower quadrant drain site dry and intact, with the drain containing small amount of nonpurulent dark bloody discharge (endometrioma contents?) Assessment & Plan Patient continues to make progress with infected right tubo-ovarian complex in the background of severe endometriosis, afebrile on p.o. antibiotics CT drain appears incapable of draining old different loculations of the right tubo-ovarian complex GYN oncology follow-up acknowledged and appreciated Agree with discharge in a.m. on p.o.  antibiotics and follow-up at GYN oncology with the ultimate plan to do extirpative surgery (right salpingo-oophorectomy versus hysterectomy with BSO), as the patient is not interested in fertility anymore.    [name] 01/18/2022

## 2022-01-18 NOTE — Progress Notes (Signed)
Gynecologic Oncology Progress Note  Premenopausal patient with known history of severe endometriosis referred to GYN ONC after transvaginal aspiration of small endometrioma resulted in what is believed to be tubo-ovarian abscess currently admitted to the hospital for IV antibiotics s/p CT drain placement on 01/14/2022.  Subjective: Patient reports moderate nausea in the evening with feelings of emesis. Decreased appetite related to nausea. She describes her abdominal discomfort as light and manageable. She feels her abdominal distension is returning and she is sweating more during the night. She is voiding without difficulty.  She had a loose bowel movement yesterday am. Ambulating without difficulty. All questions answered.  Objective: Vital signs in last 24 hours: Temp:  [98 F (36.7 C)-98.5 F (36.9 C)] 98.3 F (36.8 C) (12/06 0532) Pulse Rate:  [83-92] 83 (12/06 0532) Resp:  [16-18] 16 (12/06 0532) BP: (117-147)/(79-93) 147/93 (12/06 0532) SpO2:  [96 %-98 %] 96 % (12/06 0532) Last BM Date : 01/17/22  Intake/Output from previous day: 12/05 0701 - 12/06 0700 In: 1140 [P.O.:1080; IV Piggyback:50] Out: 2970 [Urine:2850; Drains:120]  Physical Examination: General: alert, cooperative, and no distress Resp: clear to auscultation bilaterally Cardio: regular rate and rhythm, S1, S2 normal, no murmur, click, rub or gallop GI: soft, non-tender; bowel sounds normal; no masses,  no organomegaly Extremities: extremities normal, atraumatic, no cyanosis or edema Right abdominal drain with dark bloody drainage. Dressing intact. SCDs on  Labs: WBC/Hgb/Hct/Plts:  11.4/9.2/29.7/575 (12/06 0451) BUN/Cr/glu/ALT/AST/amyl/lip:  8/0.52/--/--/--/--/-- (12/06 0451)  Culture from 01/24/22 remains in preliminary status: MODERATE PREVOTELLA BIVIA  BETA LACTAMASE POSITIVE   Assessment: 41 y.o. with large TOA following imaged-guided transvaginal drainage of endometriomas s/p CT guided drain placement on  01/14/22 on IV antibiotics for suspected tubo-ovarian abscess: stable and improving  Pain:  Pain is well-controlled on PRN medications.  Heme: Hgb 9.2 this am from 8.7 yesterday and Hct 29.7 this am from 28.3. Continue to monitor. Values stable and pt asymptomatic.  ID: WBC 11.4 this am from 12.4 yesterday. Afebrile and pt reporting improvement. Transitioned to PO antibiotics on 01/17/22 to levaquin and flagyl. Due to nausea, decreased appetite, strong feelings of emesis, plan to change oral antibiotics to augmentin.  CV: BP and HR stable. Continue to monitor.  GI:  Tolerating po: decreased due to nausea. Antiemetics ordered as needed.  GU: Creatinine 0.52 this am from 0.70 yesterday. Voiding without difficulty. Adequate output reported.  FEN: No critical values on am Bmet.  Endo: Diabetes mellitus Type II, under good control.  CBG: CBG (last 3)  Recent Labs    01/17/22 1705 01/17/22 2113 01/18/22 0722  GLUCAP 136* 137* 103*     Prophylaxis: Lovenox ordered.  Plan per Dr. Berline Lopes: Given moderate nausea, decreased appetite beginning after administration of levaquin and flagyl, plan to change oral antibiotics to Augmentin. Stressed the importance of staying hydrated and oral intake.  CT scan ordered for today per IR to follow up on pelvic process and for drain management.  Will need outpatient IR follow up. Continue drain flushes per IR.  Continue to monitor. Possible discharge later today or in the am if nausea improves, tolerating augmentin, tolerating diet.   LOS: 5 days    Marie Hill 01/18/2022, 8:20 AM

## 2022-01-19 ENCOUNTER — Other Ambulatory Visit (HOSPITAL_COMMUNITY): Payer: Self-pay

## 2022-01-19 ENCOUNTER — Other Ambulatory Visit: Payer: Self-pay | Admitting: Student

## 2022-01-19 DIAGNOSIS — N7093 Salpingitis and oophoritis, unspecified: Secondary | ICD-10-CM | POA: Diagnosis not present

## 2022-01-19 LAB — BASIC METABOLIC PANEL
Anion gap: 14 (ref 5–15)
BUN: 9 mg/dL (ref 6–20)
CO2: 22 mmol/L (ref 22–32)
Calcium: 9.6 mg/dL (ref 8.9–10.3)
Chloride: 101 mmol/L (ref 98–111)
Creatinine, Ser: 0.57 mg/dL (ref 0.44–1.00)
GFR, Estimated: >60 mL/min (ref >60)
Glucose, Bld: 117 mg/dL — ABNORMAL HIGH (ref 70–99)
Potassium: 3.7 mmol/L (ref 3.5–5.1)
Sodium: 137 mmol/L (ref 135–145)

## 2022-01-19 LAB — CBC
HCT: 30.6 % — ABNORMAL LOW (ref 36.0–46.0)
Hemoglobin: 9.4 g/dL — ABNORMAL LOW (ref 12.0–15.0)
MCH: 24.7 pg — ABNORMAL LOW (ref 26.0–34.0)
MCHC: 30.7 g/dL (ref 30.0–36.0)
MCV: 80.3 fL (ref 80.0–100.0)
Platelets: 574 10*3/uL — ABNORMAL HIGH (ref 150–400)
RBC: 3.81 MIL/uL — ABNORMAL LOW (ref 3.87–5.11)
RDW: 15.2 % (ref 11.5–15.5)
WBC: 14.7 10*3/uL — ABNORMAL HIGH (ref 4.0–10.5)
nRBC: 0 % (ref 0.0–0.2)

## 2022-01-19 LAB — GLUCOSE, CAPILLARY
Glucose-Capillary: 97 mg/dL (ref 70–99)
Glucose-Capillary: 122 mg/dL — ABNORMAL HIGH (ref 70–99)
Glucose-Capillary: 125 mg/dL — ABNORMAL HIGH (ref 70–99)
Glucose-Capillary: 147 mg/dL — ABNORMAL HIGH (ref 70–99)

## 2022-01-19 MED ORDER — PIPERACILLIN-TAZOBACTAM 3.375 G IVPB
3.3750 g | Freq: Three times a day (TID) | INTRAVENOUS | Status: DC
  Administered 2022-01-19 – 2022-01-20 (×4): 3.375 g via INTRAVENOUS
  Filled 2022-01-19 (×4): qty 50

## 2022-01-19 MED ORDER — SODIUM CHLORIDE 0.9% FLUSH
5.0000 mL | Freq: Every day | INTRAVENOUS | 0 refills | Status: DC
  Filled 2022-01-19: qty 200, 10d supply, fill #0

## 2022-01-19 NOTE — Progress Notes (Signed)
Referring Physician(s): Jackson-Moore,L/Tucker,K  Supervising Physician: Aletta Edouard  Patient Status:  [facility] - In-pt  Chief Complaint:  Hx of severe endometriosis  Pelvic abscess formation after transvaginal aspiration of small endometrioma S/p drain placement by Dr. Denna Haggard on 12/2.   Subjective:  Patient laying in  bed, NAD.  Denies abdominal pain or N/V this morning.  No d/c plan yet.   Allergies: Patient has no known allergies.  Medications: Prior to Admission medications   Medication Sig Start Date End Date Taking? Authorizing Provider  acetaminophen (TYLENOL) 500 MG tablet Take 500 mg by mouth every 6 (six) hours as needed for moderate pain.   Yes [provider]  amoxicillin-clavulanate (AUGMENTIN) 875-125 MG tablet Take 1 tablet by mouth 2 (two) times daily. 12/30/21  Yes Governor Specking, MD  doxycycline (VIBRAMYCIN) 50 MG capsule Take 2 capsules (100 mg total) by mouth 2 (two) times daily. 12/30/21  Yes Governor Specking, MD  empagliflozin (JARDIANCE) 25 MG TABS tablet Take 25 mg by mouth daily.   Yes [provider]  ibuprofen (ADVIL) 200 MG tablet Take 400 mg by mouth every 6 (six) hours as needed for moderate pain.   Yes [provider]  metFORMIN (GLUCOPHAGE-XR) 500 MG 24 hr tablet Take 1,000 mg by mouth 2 (two) times daily with a meal.   Yes [provider]  rosuvastatin (CRESTOR) 40 MG tablet Take 40 mg by mouth daily.   Yes [provider]  traMADol (ULTRAM) 50 MG tablet Take 50 mg by mouth every 6 (six) hours as needed for moderate pain or severe pain. 01/10/22  Yes [provider]     Vital Signs: BP (!) 143/93 (BP Location: Left Arm)   Pulse 86   Temp 98.5 F (36.9 C) (Oral)   Resp 16   Ht '5\' 2"'$  (1.575 m)   Wt 115 lb 4 oz (52.3 kg)   LMP 12/28/2021   SpO2 96%   BMI 21.08 kg/m   Physical Exam Vitals reviewed.  Constitutional:      General: She is not in acute distress.    Appearance:  She is not ill-appearing.  Pulmonary:     Effort: Pulmonary effort is normal.  Abdominal:     General: Abdomen is flat.     Palpations: Abdomen is soft.  Skin:    General: Skin is warm and dry.     Coloration: Skin is not jaundiced or pale.     Comments: Positive RLQ drain to a suction bulb. Site is unremarkable with no erythema, edema, tenderness, bleeding or drainage. Suture and stat lock in place. Dressing is clean, dry, and intact. 10 ml of  bloody fluid noted in the bulb. Drain flushes well.    Neurological:     Mental Status: She is alert.  Psychiatric:        Mood and Affect: Mood normal.        Behavior: Behavior normal.     Imaging: CT ABDOMEN PELVIS W CONTRAST  Result Date: 01/18/2022 CLINICAL DATA:  Status post percutaneous catheter drainage right adnexal abscess on 01/14/2022. EXAM: CT ABDOMEN AND PELVIS WITH CONTRAST TECHNIQUE: Multidetector CT imaging of the abdomen and pelvis was performed using the standard protocol following bolus administration of intravenous contrast. RADIATION DOSE REDUCTION: This exam was performed according to the departmental dose-optimization program which includes automated exposure control, adjustment of the mA and/or kV according to patient size and/or use of iterative reconstruction technique. CONTRAST:  56m OMNIPAQUE IOHEXOL 300 MG/ML  SOLN COMPARISON:  None Available. FINDINGS: Lower chest: No acute abnormality. Hepatobiliary: No focal liver abnormality is seen. No gallstones, gallbladder wall thickening, or biliary dilatation. Pancreas: Unremarkable. No pancreatic ductal dilatation or surrounding inflammatory changes. Spleen: Normal in size without focal abnormality. Adrenals/Urinary Tract: Adrenal glands are unremarkable. Kidneys are normal, without renal calculi, focal lesion, or hydronephrosis. Bladder is unremarkable. Stomach/Bowel: No evidence of bowel obstruction. Moderate fecal material throughout the colon. No free intraperitoneal air.  Potential secondary inflammation the sigmoid colon adjacent to adnexal inflammatory process in the pelvis. Vascular/Lymphatic: No vascular findings. No enlarged abdominal or pelvic lymph nodes. Reproductive: Percutaneous drainage catheter from a right anterior approach enters the right pelvis. The unilocular component of abscess in the right pelvis has been adequately drained with no significant fluid remaining at the level of the drainage catheter. There are multiple small loculations of residual fluid remaining in a large area of residual inflammation measuring up to 9 cm in diameter. These small loculations of fluid all contain a small amount of fluid as well as air suggesting some communication with the original large cavity as they did not contain air before and air is likely being introduced with drain flushing. Largest pocket of residual fluid measures roughly 2.9 cm in greatest diameter and there is not a large enough pocket of fluid remaining to warrant additional drain placement. Other: No abdominal wall hernia or abnormality. No free fluid in the peritoneal cavity. Musculoskeletal: No acute or significant osseous findings. IMPRESSION: Successful drainage of unilocular component of right adnexal abscess. There are multiple loculations of fluid remaining in a large area of residual inflammation measuring up to 9 cm in diameter. These small loculations of fluid all contain a small amount of fluid as well as air suggesting some communication with the original large cavity as they did not contain air before and air is likely being introduced with drain flushing. Largest pocket of residual fluid measures roughly 2.9 cm in greatest diameter and there is not a large enough pocket of fluid remaining to warrant additional drain placement. Electronically Signed   By: Aletta Edouard M.D.   On: 01/18/2022 11:43    Labs:  CBC: Recent Labs    01/16/22 0319 01/17/22 0451 01/18/22 0451 01/19/22 0509  WBC 14.9*  12.4* 11.4* 14.7*  HGB 8.8* 8.7* 9.2* 9.4*  HCT 28.5* 28.3* 29.7* 30.6*  PLT 562* 545* 575* 574*    COAGS: Recent Labs    12/26/21 1058  INR 1.4*  APTT 33    BMP: Recent Labs    01/16/22 0319 01/17/22 0451 01/18/22 0451 01/19/22 0509  NA 138 136 134* 137  K 3.9 4.0 3.9 3.7  CL 105 102 99 101  CO2 '23 23 22 22  '$ GLUCOSE 120* 104* 105* 117*  BUN '6 8 8 9  '$ CALCIUM 9.4 9.5 9.5 9.6  CREATININE 0.89 0.70 0.52 0.57  GFRNONAA >60 >60 >60 >60    LIVER FUNCTION TESTS: Recent Labs    12/26/21 1058 01/13/22 0921 01/14/22 1236  BILITOT 1.4* 0.7 0.6  AST 41 70* 23  ALT 59* 97* 57*  ALKPHOS 347* 810* 518*  PROT 7.3 10.0* 7.7  ALBUMIN 2.4* 3.8 2.6*    Assessment and Plan:  41 y.o. female with Hx of severe endometriosis, s/p transvaginal aspiration of small endometrioma who presented with right adnexa/pelvic abscess formation, s/p drain placement by Dr. Denna Haggard on 01/14/22  VSS WBC fluctuating, 14.7 <<11.4<<12.4<<14.9 - pt on po abx but not tolerating well due to N/V,  GYN team considering switching to IV abx  Cx prevotella bivia Drain output trending down, 85 mL <<120 mL<< 110 mL, bloody output in suction bulb this morning   Had F/U CT yesterday, still has multiple loculation of fluid but there was not a large enough fluid pocket for additional drain placement     Drain Location: RLQ Size: Fr size: 12 Fr Date of placement: 12/2  Currently to: Drain collection device: suction bulb 24 hour output:  Output by Drain (mL) 01/17/22 0701 - 01/17/22 1900 01/17/22 1901 - 01/18/22 0700 01/18/22 0701 - 01/18/22 1900 01/18/22 1901 - 01/19/22 0700 01/19/22 0701 - 01/19/22 0937  Closed System Drain 1 RUQ Bulb (JP) 12 Fr. 45 75 15 70     Interval imaging/drain manipulation:  12/6: CT AP with contrast  Successful drainage of unilocular component of right adnexal abscess. There are multiple loculations of fluid remaining in a large area of residual inflammation measuring up to 9 cm  in diameter. These small loculations of fluid all contain a small amount of fluid as well as air suggesting some communication with the original large cavity as they did not contain air before and air is likely being introduced with drain flushing. Largest pocket of residual fluid measures roughly 2.9 cm in greatest diameter and there is not a large enough pocket of fluid remaining to warrant additional drain placement.  Current examination: Flushes easily.  Insertion site unremarkable. Suture and stat lock in place. Dressed appropriately.   Plan: Continue TID flushes with 5 cc NS. Record output Q shift. Dressing changes QD or PRN if soiled.  Call IR APP or on call IR MD if difficulty flushing or sudden change in drain output.  Repeat imaging/possible drain injection once output < 10 mL/QD (excluding flush material). Consideration for drain removal if output is < 10 mL/QD (excluding flush material), pending discussion with the providing surgical service.  Discharge planning:  Flush sent to [place].  F/u CT and possible drain injection ordered.  Drain care education done, pt states she feels comfortable with drain care.  Typically patient will follow up with IR clinic 10-14 days post d/c for repeat imaging/possible drain injection. IR scheduler will contact patient with date/time of appointment. Patient will need to flush drain QD with 5 cc NS, record output QD, dressing changes every 2-3 days or earlier if soiled.   IR will continue to follow - please call with questions or concerns.   Electronically Signed: Tera Mater, PA-C 01/19/2022, 9:01 AM   I spent a total of 15 Minutes at the the patient's bedside AND on the patient's hospital floor or unit, greater than 50% of which was counseling/coordinating care for right adnexa/pelvic abscess drain f/u.   This chart was dictated using voice recognition software.  Despite best efforts to proofread,  errors can occur which  can change the documentation meaning.

## 2022-01-19 NOTE — Progress Notes (Signed)
Gynecologic Oncology Progress Note  Premenopausal patient with known history of severe endometriosis referred to GYN ONC after transvaginal aspiration of small endometrioma resulted in what is believed to be tubo-ovarian abscess currently admitted to the hospital for IV antibiotics s/p CT drain placement on 01/14/2022.  Subjective: Patient reports feelings of the pain related to infection returning including pain in her lower back as well. Continues to have decreased appetite related to taste of foods.  She feels her abdominal distension has slightly decreased but not by much. She continues to have intermittent sweating more during the night. She is voiding and reports pain at the end of her urine stream.  She had a loose bowel movement yesterday am. Ambulating without difficulty but reporting stiffness on her right side. She feels she does not tolerate oral form of antibiotic because she feels much better when on IV.   Objective: Vital signs in last 24 hours: Temp:  [98.3 F (36.8 C)-98.5 F (36.9 C)] 98.5 F (36.9 C) (12/07 0612) Pulse Rate:  [86-90] 86 (12/07 0612) Resp:  [16-18] 16 (12/07 0612) BP: (119-143)/(78-93) 143/93 (12/07 0612) SpO2:  [96 %] 96 % (12/07 0612) Last BM Date : 01/18/22  Intake/Output from previous day: 12/06 0701 - 12/07 0700 In: 43 [P.O.:960] Out: 485 [Urine:400; Drains:85]  Physical Examination: General: alert, cooperative, and no distress Resp: clear to auscultation bilaterally Cardio: regular rate and rhythm, S1, S2 normal, no murmur, click, rub or gallop GI: soft, non-tender; bowel sounds normal; no masses,  no organomegaly Extremities: extremities normal, atraumatic, no cyanosis or edema Right abdominal drain with dark bloody drainage. Dressing intact. SCDs on  Labs: WBC/Hgb/Hct/Plts:  14.7/9.4/30.6/574 (12/07 3762) BUN/Cr/glu/ALT/AST/amyl/lip:  9/0.57/--/--/--/--/-- (12/07 0509)  Culture from 01/24/22 with final status: MODERATE PREVOTELLA BIVIA   BETA LACTAMASE POSITIVE   Assessment: 41 y.o. with large TOA following imaged-guided transvaginal drainage of endometriomas s/p CT guided drain placement on 01/14/22 on IV antibiotics for suspected tubo-ovarian abscess: stable and improving  Pain:  Pain is well-controlled on PRN medications.  Heme: Hgb 9.4 this am from 9.2 yesterday and Hct 30.6 this am from 29.7. Continue to monitor. Values stable and pt asymptomatic.  ID: WBC 14.7 this am from 11.4 yesterday. Patient reporting increase in pain similar to pain experienced with infection. Transitioned to PO antibiotics on 01/17/22 to levaquin and flagyl. Due to nausea, decreased appetite, strong feelings of emesis, changed oral antibiotics to augmentin on 01/18/22. Plan to reach out to pharmacy about possible need to be changed to IV antibiotics and discharged home on an IV regimen.   CV: BP and HR stable. Continue to monitor.  GI:  Tolerating po: decreased, altered taste of foods. Antiemetics ordered as needed.  GU: Creatinine 0.57 this am from 0.52 yesterday. Voiding-plan for urine sample given pain at end of stream. Adequate output reported.  FEN: No critical values on am Bmet.  Endo: Diabetes mellitus Type II, under good control.  CBG: CBG (last 3)  Recent Labs    01/18/22 1210 01/18/22 1600 01/18/22 2150  GLUCAP 105* 132* 144*     Prophylaxis: Lovenox ordered.  Plan per Dr. Berline Lopes: Given rise in WBC count, pt not progressing with symptoms of infection returning while on oral antibiotics, plan to reach out pharmacy about possible need to change back to IV antibiotics with plans for discharge home for several weeks on an IV regimen.  CT scan performed on 01/18/22 per IR.  Will need outpatient IR follow up. Continue drain flushes per IR.  Continue  plan of care   LOS: 6 days    Dorothyann Gibbs 01/19/2022, 7:37 AM

## 2022-01-19 NOTE — Consult Note (Signed)
[facility] Infectious Disease  Total days of antibiotics 7       Reason for Consult: ovarian abscess   Referring Physician: tucker  Principal Problem:   Ovarian cystic mass    HPI: Marie Hill is a 41 y.o. female with T2Dm, hx of endometriosis, who had TV endometriomectomy x 2 with prophylactic doxycycline roughly 6 weeks ago. However, still developed abdominal pain with infected endometriomas on 11/13. Was briefly admitted and discharged on doxycycline plus augmentin x 10 days. Initially did well with oral abtx but then started to have recurrence of fever, rigors and chills that would break through anti-pyretics. She  was readmitted on 12/1 for abdominal pain, leukocytosis, with tachycardia meeting SIRS criteria. Ct showed reaccumulation of right adnexal complex cystic peripherally enhancing multiloculated lesion measuring 13 x 13 x 10 cm in addition to causing mass effect on ureter/hydronephrosis. On 12/2, she had CT guided drain placement with 133m immediate drainage of fluid. Cx showing prevotella. Her WBC and transaminitis improved. She was empirically placed on piptazo on 12/1. Initial leukocytosis of 20K improved to 12.4K increased slighly to 14K thought to be due to switching to oral abtx, when she received amox/clav. Due to concern for failure of oral abtx, she was transitioned back to iv piptazo today. She is afebrile but still feels like she is having right lower quadrant pain and flank pain.  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   no meds 1997, treated in June 2016 x 1 month- states neg chest x ray June 2016    Allergies: No Known Allergies  Current antibiotics:   MEDICATIONS:   empagliflozin  25 mg Oral Daily   enoxaparin (LOVENOX) injection  40 mg Subcutaneous Q24H   feeding supplement (GLUCERNA SHAKE)  237 mL Oral TID BM   insulin aspart  0-5 Units Subcutaneous QHS   insulin aspart  0-9 Units Subcutaneous TID WC   metFORMIN  1,000 mg Oral BID WC   polyethylene glycol  17 g Oral Daily   rosuvastatin  40 mg Oral Daily   senna-docusate  2 tablet Oral QHS   sodium chloride flush  5 mL Intracatheter Q8H   sodium chloride flush  5 mL Intracatheter Q8H    Social History   Tobacco Use   Smoking status: Never   Smokeless tobacco: Never  Vaping Use   Vaping Use: Never used  Substance Use Topics   Alcohol use: Not Currently    Alcohol/week: 0.0 standard drinks of alcohol   Drug use: No    Family History  Problem Relation Age of Onset   Hyperlipidemia Mother    Hypertension Mother    Hyperlipidemia Father      Review of Systems  Constitutional: +for fever, chills, diaphoresis, activity change, appetite change, fatigue and unexpected weight change.  HENT: Negative for congestion, sore throat, rhinorrhea, sneezing, trouble swallowing and sinus pressure.  Eyes: Negative for photophobia and visual disturbance.  Respiratory: Negative for cough, chest tightness, shortness of breath, wheezing and stridor.  Cardiovascular: Negative for chest pain, palpitations and leg swelling.  Gastrointestinal: Negative for nausea, vomiting, abdominal pain, diarrhea, constipation, blood in stool, abdominal distention and  anal bleeding.  Genitourinary: +right lower quadrant pain. Negative for dysuria, hematuria, flank pain and difficulty urinating.  Musculoskeletal: Negative for myalgias, back pain, joint swelling, arthralgias and gait problem.  Skin: Negative for color change, pallor, rash and wound.  Neurological: Negative for dizziness, tremors, weakness and light-headedness.  Hematological: Negative for adenopathy. Does not bruise/bleed easily.  Psychiatric/Behavioral:  Negative for behavioral problems, confusion, sleep disturbance, dysphoric mood, decreased concentration and agitation.     OBJECTIVE: Temp:  [97.6 F (36.4 C)-98.5 F (36.9 C)] 97.6 F (36.4 C) (12/07 1402) Pulse Rate:  [86-108] 108 (12/07 1402) Resp:  [16-18] 18 (12/07 1402) BP: (125-143)/(88-93) 127/89 (12/07 1402) SpO2:  [96 %-98 %] 98 % (12/07 1402) Physical Exam  Constitutional:  oriented to person, place, and time. appears well-developed and well-nourished. No distress.  HENT: [place]/AT, PERRLA, no scleral icterus Mouth/Throat: Oropharynx is clear and moist. No oropharyngeal exudate.  Cardiovascular: Normal rate, regular rhythm and normal heart sounds. Exam reveals no gallop and no friction rub.  No murmur heard.  Pulmonary/Chest: Effort normal and breath sounds normal. No respiratory distress.  has no wheezes.  Neck = supple, no nuchal rigidity Abdominal: Soft. Bowel sounds are normal.  exhibits no distension. There is no tenderness. +right LQ drain- serosanginous fluid. Lymphadenopathy: no cervical adenopathy. No axillary adenopathy Neurological: alert and oriented to person, place, and time.  Skin: Skin is warm and dry. No rash noted. No erythema.  Psychiatric: a normal mood and affect.  behavior is normal.    LABS: Results for orders placed or performed during the hospital encounter of 01/13/22 (from the past 48 hour(s))  Glucose, capillary     Status: Abnormal   Collection Time: 01/17/22  9:13 PM  Result Value Ref Range   Glucose-Capillary 137 (H) 70 - 99 mg/dL    Comment: Glucose reference range applies only to samples taken after fasting for at least 8 hours.  CBC with Differential/Platelet     Status: Abnormal   Collection Time: 01/18/22  4:51 AM  Result Value Ref Range   WBC 11.4 (H) 4.0 - 10.5 K/uL   RBC 3.73 (L) 3.87 - 5.11 MIL/uL   Hemoglobin 9.2 (L) 12.0 - 15.0 g/dL   HCT 29.7 (L) 36.0 - 46.0 %   MCV 79.6 (L) 80.0 - 100.0 fL   MCH 24.7 (L) 26.0 - 34.0 pg    MCHC 31.0 30.0 - 36.0 g/dL   RDW 15.0 11.5 - 15.5 %   Platelets 575 (H) 150 - 400 K/uL   nRBC 0.2 0.0 - 0.2 %   Neutrophils Relative % 67 %   Neutro Abs 7.7 1.7 - 7.7 K/uL   Lymphocytes Relative 21 %   Lymphs Abs 2.4 0.7 - 4.0 K/uL   Monocytes Relative 7 %   Monocytes Absolute 0.8 0.1 - 1.0 K/uL   Eosinophils Relative 2 %   Eosinophils Absolute 0.2 0.0 - 0.5 K/uL   Basophils Relative 0 %   Basophils Absolute 0.1 0.0 - 0.1 K/uL   Immature Granulocytes 3 %   Abs Immature Granulocytes 0.29 (H) 0.00 - 0.07 K/uL    Comment: Performed at [facility]: [place] [address] [address], [place] [address], [place], [place] [address]  Basic metabolic panel     Status: Abnormal   Collection Time: 01/18/22  4:51 AM  Result Value Ref Range   Sodium 134 (L) 135 - 145 mmol/L   Potassium 3.9 3.5 - 5.1 mmol/L   Chloride 99 98 - 111 mmol/L   CO2 22  22 - 32 mmol/L   Glucose, Bld 105 (H) 70 - 99 mg/dL    Comment: Glucose reference range applies only to samples taken after fasting for at least 8 hours.   BUN 8 6 - 20 mg/dL   Creatinine, Ser 0.52 0.44 - 1.00 mg/dL   Calcium 9.5 8.9 - 10.3 mg/dL   GFR, Estimated >60 >60 mL/min    Comment: (NOTE) Calculated using the CKD-EPI Creatinine Equation (2021)    Anion gap 13 5 - 15    Comment: Performed at [facility], [place] [address], [place], [place] [address]  Glucose, capillary     Status: Abnormal   Collection Time: 01/18/22  7:22 AM  Result Value Ref Range   Glucose-Capillary 103 (H) 70 - 99 mg/dL    Comment: Glucose reference range applies only to samples taken after fasting for at least 8 hours.  Glucose, capillary     Status: Abnormal   Collection Time: 01/18/22 12:10 PM  Result Value Ref Range   Glucose-Capillary 105 (H) 70 - 99 mg/dL    Comment: Glucose reference range applies only to samples taken after fasting for at least 8 hours.  Glucose, capillary     Status: Abnormal   Collection Time: 01/18/22  4:00 PM  Result Value Ref Range    Glucose-Capillary 132 (H) 70 - 99 mg/dL    Comment: Glucose reference range applies only to samples taken after fasting for at least 8 hours.  Glucose, capillary     Status: Abnormal   Collection Time: 01/18/22  9:50 PM  Result Value Ref Range   Glucose-Capillary 144 (H) 70 - 99 mg/dL    Comment: Glucose reference range applies only to samples taken after fasting for at least 8 hours.  CBC     Status: Abnormal   Collection Time: 01/19/22  5:09 AM  Result Value Ref Range   WBC 14.7 (H) 4.0 - 10.5 K/uL   RBC 3.81 (L) 3.87 - 5.11 MIL/uL   Hemoglobin 9.4 (L) 12.0 - 15.0 g/dL   HCT 30.6 (L) 36.0 - 46.0 %   MCV 80.3 80.0 - 100.0 fL   MCH 24.7 (L) 26.0 - 34.0 pg   MCHC 30.7 30.0 - 36.0 g/dL   RDW 15.2 11.5 - 15.5 %   Platelets 574 (H) 150 - 400 K/uL   nRBC 0.0 0.0 - 0.2 %    Comment: Performed at [facility], [place] [address] [place], [place] [address]  Basic metabolic panel     Status: Abnormal   Collection Time: 01/19/22  5:09 AM  Result Value Ref Range   Sodium 137 135 - 145 mmol/L   Potassium 3.7 3.5 - 5.1 mmol/L   Chloride 101 98 - 111 mmol/L   CO2 22 22 - 32 mmol/L   Glucose, Bld 117 (H) 70 - 99 mg/dL    Comment: Glucose reference range applies only to samples taken after fasting for at least 8 hours.   BUN 9 6 - 20 mg/dL   Creatinine, Ser 0.57 0.44 - 1.00 mg/dL   Calcium 9.6 8.9 - 10.3 mg/dL   GFR, Estimated >60 >60 mL/min    Comment: (NOTE) Calculated using the CKD-EPI Creatinine Equation (2021)    Anion gap 14 5 - 15    Comment: Performed at [facility], [place] [address] [place], [place] [address]  Glucose, capillary     Status: None   Collection Time: 01/19/22  7:37 AM  Result  Value Ref Range   Glucose-Capillary 97 70 - 99 mg/dL    Comment: Glucose reference range applies only to samples taken after fasting for at least 8 hours.  Glucose, capillary     Status: Abnormal   Collection Time: 01/19/22 11:52 AM  Result Value Ref Range    Glucose-Capillary 122 (H) 70 - 99 mg/dL    Comment: Glucose reference range applies only to samples taken after fasting for at least 8 hours.  Glucose, capillary     Status: Abnormal   Collection Time: 01/19/22  5:05 PM  Result Value Ref Range   Glucose-Capillary 125 (H) 70 - 99 mg/dL    Comment: Glucose reference range applies only to samples taken after fasting for at least 8 hours.    MICRO: Gram Stain RARE WBC PRESENT, PREDOMINANTLY PMN FEW GRAM POSITIVE COCCI IN PAIRS IN CHAINS RARE GRAM NEGATIVE RODS  Culture MODERATE PREVOTELLA BIVIA BETA LACTAMASE POSITIVE   IMAGING: CT ABDOMEN PELVIS W CONTRAST  Result Date: 01/18/2022 CLINICAL DATA:  Status post percutaneous catheter drainage right adnexal abscess on 01/14/2022. EXAM: CT ABDOMEN AND PELVIS WITH CONTRAST TECHNIQUE: Multidetector CT imaging of the abdomen and pelvis was performed using the standard protocol following bolus administration of intravenous contrast. RADIATION DOSE REDUCTION: This exam was performed according to the departmental dose-optimization program which includes automated exposure control, adjustment of the mA and/or kV according to patient size and/or use of iterative reconstruction technique. CONTRAST:  47m OMNIPAQUE IOHEXOL 300 MG/ML  SOLN COMPARISON:  None Available. FINDINGS: Lower chest: No acute abnormality. Hepatobiliary: No focal liver abnormality is seen. No gallstones, gallbladder wall thickening, or biliary dilatation. Pancreas: Unremarkable. No pancreatic ductal dilatation or surrounding inflammatory changes. Spleen: Normal in size without focal abnormality. Adrenals/Urinary Tract: Adrenal glands are unremarkable. Kidneys are normal, without renal calculi, focal lesion, or hydronephrosis. Bladder is unremarkable. Stomach/Bowel: No evidence of bowel obstruction. Moderate fecal material throughout the colon. No free intraperitoneal air. Potential secondary inflammation the sigmoid colon adjacent to adnexal  inflammatory process in the pelvis. Vascular/Lymphatic: No vascular findings. No enlarged abdominal or pelvic lymph nodes. Reproductive: Percutaneous drainage catheter from a right anterior approach enters the right pelvis. The unilocular component of abscess in the right pelvis has been adequately drained with no significant fluid remaining at the level of the drainage catheter. There are multiple small loculations of residual fluid remaining in a large area of residual inflammation measuring up to 9 cm in diameter. These small loculations of fluid all contain a small amount of fluid as well as air suggesting some communication with the original large cavity as they did not contain air before and air is likely being introduced with drain flushing. Largest pocket of residual fluid measures roughly 2.9 cm in greatest diameter and there is not a large enough pocket of fluid remaining to warrant additional drain placement. Other: No abdominal wall hernia or abnormality. No free fluid in the peritoneal cavity. Musculoskeletal: No acute or significant osseous findings. IMPRESSION: Successful drainage of unilocular component of right adnexal abscess. There are multiple loculations of fluid remaining in a large area of residual inflammation measuring up to 9 cm in diameter. These small loculations of fluid all contain a small amount of fluid as well as air suggesting some communication with the original large cavity as they did not contain air before and air is likely being introduced with drain flushing. Largest pocket of residual fluid measures roughly 2.9 cm in greatest diameter and there is not a large enough  pocket of fluid remaining to warrant additional drain placement. Electronically Signed   By: Aletta Edouard M.D.   On: 01/18/2022 11:43     Assessment/Plan:  41yo F with history of endometriosis but most recently being treated for infected endometrioma for the past 3 weeks.   - has had successful drainage of  the unilocular fluid collection but still has residual inflammation and smaller pockets of infection - wbc trended upward while on oral abtx for 1 day, and I suspect this is more due to source control - her thrombocytosis - could be consist with acute phase reactant - would rather consider her to do surgery  for I x D (removal of adenexal mass) for source control and that would also minimize need for extended abtx. - for the time being, continue on piptazo  Marie Hill B. [facility] [phone]

## 2022-01-20 ENCOUNTER — Inpatient Hospital Stay (HOSPITAL_COMMUNITY): Payer: Commercial Managed Care - HMO

## 2022-01-20 ENCOUNTER — Other Ambulatory Visit: Payer: Self-pay | Admitting: Gynecologic Oncology

## 2022-01-20 ENCOUNTER — Telehealth: Payer: Self-pay | Admitting: *Deleted

## 2022-01-20 ENCOUNTER — Inpatient Hospital Stay: Payer: Self-pay

## 2022-01-20 DIAGNOSIS — N7092 Oophoritis, unspecified: Secondary | ICD-10-CM | POA: Diagnosis not present

## 2022-01-20 DIAGNOSIS — N7093 Salpingitis and oophoritis, unspecified: Principal | ICD-10-CM

## 2022-01-20 LAB — URINE CULTURE: Culture: NO GROWTH

## 2022-01-20 LAB — BASIC METABOLIC PANEL
Anion gap: 13 (ref 5–15)
BUN: 10 mg/dL (ref 6–20)
CO2: 24 mmol/L (ref 22–32)
Calcium: 9.4 mg/dL (ref 8.9–10.3)
Chloride: 97 mmol/L — ABNORMAL LOW (ref 98–111)
Creatinine, Ser: 0.55 mg/dL (ref 0.44–1.00)
GFR, Estimated: >60 mL/min (ref >60)
Glucose, Bld: 119 mg/dL — ABNORMAL HIGH (ref 70–99)
Potassium: 3.5 mmol/L (ref 3.5–5.1)
Sodium: 134 mmol/L — ABNORMAL LOW (ref 135–145)

## 2022-01-20 LAB — CBC WITH DIFFERENTIAL/PLATELET
Abs Immature Granulocytes: 0.31 10*3/uL — ABNORMAL HIGH (ref 0.00–0.07)
Basophils Absolute: 0.1 10*3/uL (ref 0.0–0.1)
Basophils Relative: 1 %
Eosinophils Absolute: 0.2 10*3/uL (ref 0.0–0.5)
Eosinophils Relative: 1 %
HCT: 30.2 % — ABNORMAL LOW (ref 36.0–46.0)
Hemoglobin: 9.2 g/dL — ABNORMAL LOW (ref 12.0–15.0)
Immature Granulocytes: 2 %
Lymphocytes Relative: 20 %
Lymphs Abs: 2.6 10*3/uL (ref 0.7–4.0)
MCH: 24.4 pg — ABNORMAL LOW (ref 26.0–34.0)
MCHC: 30.5 g/dL (ref 30.0–36.0)
MCV: 80.1 fL (ref 80.0–100.0)
Monocytes Absolute: 0.7 10*3/uL (ref 0.1–1.0)
Monocytes Relative: 6 %
Neutro Abs: 9.2 10*3/uL — ABNORMAL HIGH (ref 1.7–7.7)
Neutrophils Relative %: 70 %
Platelets: 620 10*3/uL — ABNORMAL HIGH (ref 150–400)
RBC: 3.77 MIL/uL — ABNORMAL LOW (ref 3.87–5.11)
RDW: 15.2 % (ref 11.5–15.5)
WBC: 13 10*3/uL — ABNORMAL HIGH (ref 4.0–10.5)
nRBC: 0.2 % (ref 0.0–0.2)

## 2022-01-20 LAB — GLUCOSE, CAPILLARY
Glucose-Capillary: 118 mg/dL — ABNORMAL HIGH (ref 70–99)
Glucose-Capillary: 83 mg/dL (ref 70–99)

## 2022-01-20 MED ORDER — SODIUM CHLORIDE 0.9% FLUSH
10.0000 mL | Freq: Two times a day (BID) | INTRAVENOUS | Status: DC
  Administered 2022-01-20: 10 mL

## 2022-01-20 MED ORDER — PIPERACILLIN-TAZOBACTAM IV (FOR PTA / DISCHARGE USE ONLY): 13.5000 g | Freq: Every day | INTRAVENOUS | 0 refills | Status: DC

## 2022-01-20 MED ORDER — CHLORHEXIDINE GLUCONATE CLOTH 2 % EX PADS: 6.0000 | MEDICATED_PAD | Freq: Every day | CUTANEOUS | Status: DC

## 2022-01-20 MED ORDER — SODIUM CHLORIDE 0.9% FLUSH: 10.0000 mL | INTRAVENOUS | Status: DC | PRN

## 2022-01-20 MED ORDER — HEPARIN SOD (PORK) LOCK FLUSH 100 UNIT/ML IV SOLN
250.0000 [IU] | INTRAVENOUS | Status: AC | PRN
  Administered 2022-01-20: 250 [IU]
  Filled 2022-01-20: qty 3

## 2022-01-20 NOTE — Progress Notes (Signed)
Monthly dose of lupron ordered per Dr. Berline Lopes to be given as injection at the beginning of next week for endometriosis with goal to suppress ovarian function.

## 2022-01-20 NOTE — Progress Notes (Signed)
Peripherally Inserted Central Catheter Placement  The IV Nurse has discussed with the patient and/or persons authorized to consent for the patient, the purpose of this procedure and the potential benefits and risks involved with this procedure.  The benefits include less needle sticks, lab draws from the catheter, and the patient may be discharged home with the catheter. Risks include, but not limited to, infection, bleeding, blood clot (thrombus formation), and puncture of an artery; nerve damage and irregular heartbeat and possibility to perform a PICC exchange if needed/ordered by physician.  Alternatives to this procedure were also discussed.  Bard Power PICC patient education guide, fact sheet on infection prevention and patient information card has been provided to patient /or left at bedside.    PICC Placement Documentation  PICC Single Lumen 01/20/22 Left Brachial 41 cm 2 cm (Active)  Indication for Insertion or Continuance of Line Home intravenous therapies (PICC only) 01/20/22 1100  Exposed Catheter (cm) 2 cm 01/20/22 1100  Site Assessment Clean, Dry, Intact 01/20/22 1100  Line Status Flushed;Saline locked;Blood return noted 01/20/22 1100  Dressing Type Transparent;Securing device 01/20/22 1100  Dressing Status Antimicrobial disc in place;Clean, Dry, Intact 01/20/22 1100  Safety Lock Not Applicable 88/41/66 0630  Line Care Connections checked and tightened 01/20/22 1100  Dressing Intervention New dressing 01/20/22 1100  Dressing Change Due 01/27/22 01/20/22 1100       Holley Bouche [place] 01/20/2022, 11:50 AM

## 2022-01-20 NOTE — Progress Notes (Addendum)
Reviewed written d/c instructions w pt and all questions answered. She verbalized understanding. Pt shown care of her JP drain, to include flushing it, emptying it, changing the dressing and recording the amount. She gave a good return demo of the above. Awaiting MD to return call to complete d/c reconciliation and ride home.

## 2022-01-20 NOTE — Progress Notes (Signed)
Gynecologic Oncology Progress Note  Premenopausal patient with known history of severe endometriosis referred to GYN ONC after transvaginal aspiration of small endometrioma resulted in what is believed to be tubo-ovarian abscess currently admitted to the hospital for IV antibiotics s/p CT drain placement on 01/14/2022.  Subjective: Patient reports feeling better today. Swelling is softer in the abdomen. Abdominal discomfort described as "not much." Mild nausea with feelings of stomach fullness when eating but no emesis. Continues to sweat during the night. She is voiding without difficulty.  Having bowel movements. Ambulating without difficulty. No concerns voiced.   Objective: Vital signs in last 24 hours: Temp:  [97.6 F (36.4 C)-98.1 F (36.7 C)] 98.1 F (36.7 C) (12/08 0526) Pulse Rate:  [91-108] 91 (12/08 0526) Resp:  [16-18] 16 (12/08 0526) BP: (124-128)/(82-89) 124/82 (12/08 0526) SpO2:  [97 %-98 %] 97 % (12/08 0526) Last BM Date : 01/19/22  Intake/Output from previous day: 12/07 0701 - 12/08 0700 In: 1455.1 [P.O.:1300; I.V.:5; IV Piggyback:150.1] Out: 54 [Urine:900; Drains:30]  Physical Examination: General: alert, cooperative, and no distress Resp: clear to auscultation bilaterally Cardio: regular rate and rhythm, S1, S2 normal, no murmur, click, rub or gallop GI: soft, non-tender; bowel sounds normal; no masses,  no organomegaly Extremities: extremities normal, atraumatic, no cyanosis or edema Right abdominal drain with dark bloody drainage. Dressing intact. SCDs on  Labs: WBC/Hgb/Hct/Plts:  13.0/9.2/30.2/620 (12/08 7062) BUN/Cr/glu/ALT/AST/amyl/lip:  10/0.55/--/--/--/--/-- (12/08 0442)  Culture from 01/24/22 with final status: MODERATE PREVOTELLA BIVIA  BETA LACTAMASE POSITIVE   Assessment: 41 y.o. with large TOA following imaged-guided transvaginal drainage of endometriomas s/p CT guided drain placement on 01/14/22 on IV antibiotics for suspected tubo-ovarian  abscess: stable and improving  Pain:  Pain is well-controlled on PRN medications.  Heme: Hgb 9.2 this am from 9.4 yesterday and Hct 30.2 this am from 30.6. Continue to monitor while inpatient. Values stable and pt asymptomatic.  ID: WBC 13.0 from 14.7 yesterday am. Initially on zosyn on admission then transitioned to PO antibiotics on 01/17/22 to levaquin and flagyl. Due to nausea, decreased appetite, strong feelings of emesis, changed oral antibiotics to augmentin on 01/18/22. Given increasing WBC count and symptoms, patient was restarted on IV antibiotics (zosyn) with plans for this to continue after discharge. Appreciate ID consult and recommendations.   CV: BP and HR stable. Continue to monitor.  GI:  Tolerating po: decreased but improving per pt, altered taste of foods. Antiemetics ordered as needed.  GU: Creatinine 0.55 this am from 0.57 yesterday. Voiding-urine culture pending given dysuria (pt has been on broad spectrum abxs). Adequate output reported.  FEN: No critical values on am Bmet.  Endo: Diabetes mellitus Type II, under good control.  CBG: CBG (last 3)  Recent Labs    01/19/22 1705 01/19/22 2121 01/20/22 0745  GLUCAP 125* 147* 118*     Prophylaxis: Lovenox ordered.  Plan per Dr. Berline Lopes: Appreciate ID consultation and recommendations along with pharmacy assistance  Plan will be for PICC line placement and continuation of IV antibiotics after discharge per IR.  The patient has a follow up appt with GYN ONC on 12/15. Our office will also begin working on authorization for lupron injection per Dr. Berline Lopes as an outpatient.   CT scan performed on 01/18/22 per IR. Will need outpatient IR follow up. Continue drain flushes per IR.  Continue plan of care.    LOS: 7 days    Marie Hill 01/20/2022, 8:55 AM

## 2022-01-20 NOTE — TOC Transition Note (Signed)
Transition of Care [facility]) - CM/SW Discharge Note   Patient Details  Name: Marie Hill MRN: [id number] Date of Birth: December 11, 1980  [facility] CM/SW Contact:  Lennart Pall, LCSW Phone Number: 01/20/2022, 1:50 PM   Clinical Narrative:     Alerted today that pt is medically cleared for dc and will need home IV abx arrangements made.  Pt aware and agreeable and has no agency preferences.  Have secured IV abx coverage via [employer] and [place] coverage with [employer] [facility].  Pt aware.  No further TOC needs.  Final next level of care: [place] Barriers to Discharge: Barriers Resolved   Patient Goals and CMS Choice Patient states their goals for this hospitalization and ongoing recovery are:: return home      Discharge Placement                       Discharge Plan and Services                          HH Arranged: RN, IV Antibiotics HH Agency: [facility] (and [facility]) Date HH Agency Contacted: 01/20/22 Time HH Agency Contacted: 1000 Representative spoke with at [place]: Carolynn Sayers, RN  Social Determinants of Health (SDOH) Interventions Food Insecurity Interventions: Intervention Not Indicated Housing Interventions: Intervention Not Indicated Transportation Interventions: Intervention Not Indicated Utilities Interventions: Intervention Not Indicated   Readmission Risk Interventions    01/20/2022    1:49 PM 12/28/2021    2:04 PM  Readmission Risk Prevention Plan  Post Dischage Appt Complete Complete  Medication Screening Complete Complete  Transportation Screening Complete Complete

## 2022-01-20 NOTE — Progress Notes (Signed)
[facility] for Infectious Disease    Date of Admission:  01/13/2022   Total days of antibiotics 8   ID: Marie Hill is a 41 y.o. female with right ovarian abscess Principal Problem:   Ovarian cystic mass    Subjective: No fevers, but still having drenching nightsweats, ,mild lower pelvic pain with ambulation  Medications:   empagliflozin  25 mg Oral Daily   enoxaparin (LOVENOX) injection  40 mg Subcutaneous Q24H   feeding supplement (GLUCERNA SHAKE)  237 mL Oral TID BM   insulin aspart  0-5 Units Subcutaneous QHS   insulin aspart  0-9 Units Subcutaneous TID WC   metFORMIN  1,000 mg Oral BID WC   polyethylene glycol  17 g Oral Daily   rosuvastatin  40 mg Oral Daily   senna-docusate  2 tablet Oral QHS   sodium chloride flush  5 mL Intracatheter Q8H   sodium chloride flush  5 mL Intracatheter Q8H    Objective: Vital signs in last 24 hours: Temp:  [97.6 F (36.4 C)-98.1 F (36.7 C)] 98.1 F (36.7 C) (12/08 0526) Pulse Rate:  [91-108] 91 (12/08 0526) Resp:  [16-18] 16 (12/08 0526) BP: (124-128)/(82-89) 124/82 (12/08 0526) SpO2:  [97 %-98 %] 97 % (12/08 0526)  Physical Exam  Constitutional:  oriented to person, place, and time. appears well-developed and well-nourished. No distress.  HENT: [place]/AT, PERRLA, no scleral icterus Mouth/Throat: Oropharynx is clear and moist. No oropharyngeal exudate.  Cardiovascular: Normal rate, regular rhythm and normal heart sounds. Exam reveals no gallop and no friction rub.  No murmur heard.  Pulmonary/Chest: Effort normal and breath sounds normal. No respiratory distress.  has no wheezes.  Neck = supple, no nuchal rigidity Abdominal: Soft. Bowel sounds are normal.  exhibits no distension. There is no tenderness. Serosanginous fluid in bulb Lymphadenopathy: no cervical adenopathy. No axillary adenopathy Neurological: alert and oriented to person, place, and time.  Skin: Skin is warm and dry. No rash noted. No erythema.  Psychiatric: a  normal mood and affect.  behavior is normal.    Lab Results Recent Labs    01/19/22 0509 01/20/22 0442  WBC 14.7* 13.0*  HGB 9.4* 9.2*  HCT 30.6* 30.2*  NA 137 134*  K 3.7 3.5  CL 101 97*  CO2 22 24  BUN 9 10  CREATININE 0.57 0.55    Microbiology: prevotella Studies/Results: [place] EKG SITE RITE  Result Date: 01/20/2022 If Site Rite image not attached, placement could not be confirmed due to current cardiac rhythm.  CT ABDOMEN PELVIS W CONTRAST  Result Date: 01/18/2022 CLINICAL DATA:  Status post percutaneous catheter drainage right adnexal abscess on 01/14/2022. EXAM: CT ABDOMEN AND PELVIS WITH CONTRAST TECHNIQUE: Multidetector CT imaging of the abdomen and pelvis was performed using the standard protocol following bolus administration of intravenous contrast. RADIATION DOSE REDUCTION: This exam was performed according to the departmental dose-optimization program which includes automated exposure control, adjustment of the mA and/or kV according to patient size and/or use of iterative reconstruction technique. CONTRAST:  59m OMNIPAQUE IOHEXOL 300 MG/ML  SOLN COMPARISON:  None Available. FINDINGS: Lower chest: No acute abnormality. Hepatobiliary: No focal liver abnormality is seen. No gallstones, gallbladder wall thickening, or biliary dilatation. Pancreas: Unremarkable. No pancreatic ductal dilatation or surrounding inflammatory changes. Spleen: Normal in size without focal abnormality. Adrenals/Urinary Tract: Adrenal glands are unremarkable. Kidneys are normal, without renal calculi, focal lesion, or hydronephrosis. Bladder is unremarkable. Stomach/Bowel: No evidence of bowel obstruction. Moderate fecal material throughout the colon.  No free intraperitoneal air. Potential secondary inflammation the sigmoid colon adjacent to adnexal inflammatory process in the pelvis. Vascular/Lymphatic: No vascular findings. No enlarged abdominal or pelvic lymph nodes. Reproductive: Percutaneous drainage  catheter from a right anterior approach enters the right pelvis. The unilocular component of abscess in the right pelvis has been adequately drained with no significant fluid remaining at the level of the drainage catheter. There are multiple small loculations of residual fluid remaining in a large area of residual inflammation measuring up to 9 cm in diameter. These small loculations of fluid all contain a small amount of fluid as well as air suggesting some communication with the original large cavity as they did not contain air before and air is likely being introduced with drain flushing. Largest pocket of residual fluid measures roughly 2.9 cm in greatest diameter and there is not a large enough pocket of fluid remaining to warrant additional drain placement. Other: No abdominal wall hernia or abnormality. No free fluid in the peritoneal cavity. Musculoskeletal: No acute or significant osseous findings. IMPRESSION: Successful drainage of unilocular component of right adnexal abscess. There are multiple loculations of fluid remaining in a large area of residual inflammation measuring up to 9 cm in diameter. These small loculations of fluid all contain a small amount of fluid as well as air suggesting some communication with the original large cavity as they did not contain air before and air is likely being introduced with drain flushing. Largest pocket of residual fluid measures roughly 2.9 cm in greatest diameter and there is not a large enough pocket of fluid remaining to warrant additional drain placement. Electronically Signed   By: Aletta Edouard M.D.   On: 01/18/2022 11:43     Assessment/Plan: Right ovarian abscess= plan to treat for a 21 day course of IV piptazo including the days received in patient, since drain placed early in hospitalization. Spoke with dr Berline Lopes yesterday and agree with plan to have close follow up and have oophrectomy towards end of abtx course --still need source  control   Diagnosis: Right ovarian abscess  Culture Result:  prevotella  No Known Allergies  OPAT Orders Discharge antibiotics to be given via PICC line Discharge antibiotics: Per pharmacy protocol piptazoa Aim for Vancomycin trough 15-20 or AUC 400-550 (unless otherwise indicated) Duration: 21 days End Date: 02/02/2022  [facility] Care Per Protocol:  Home health RN for IV administration and teaching; PICC line care and labs.    Labs weekly while on IV antibiotics: __x CBC with differential _x_ BMP   _x_ Please pull PIC at completion of IV antibiotics   Fax weekly labs to [phone]  Clinic Follow Up Appt: In 2 wks at [facility] for Infectious Diseases Pager: [phone]  01/20/2022, 9:53 AM

## 2022-01-20 NOTE — Progress Notes (Signed)
PHARMACY CONSULT NOTE FOR:  OUTPATIENT  PARENTERAL ANTIBIOTIC THERAPY (OPAT)  Indication: tubo-ovarian abscess  Regimen: Zosyn 13.5g IV daily as a continuous infusion End date: 02/02/2022  IV antibiotic discharge orders are pended. To discharging provider:  please sign these orders via discharge navigator,  Select New Orders & click on the button choice - Manage This Unsigned Work.     Thank you for allowing pharmacy to be a part of this patient's care.  Candie Mile 01/20/2022, 10:03 AM

## 2022-01-20 NOTE — Telephone Encounter (Signed)
Lupron sent to [facility] revenue [employer] for authorization

## 2022-01-20 NOTE — Telephone Encounter (Signed)
Per Dr Berline Lopes spoke with the patient and scheduled a for a lab/injection appt for 12/12

## 2022-01-23 ENCOUNTER — Telehealth: Payer: Self-pay | Admitting: Surgery

## 2022-01-23 ENCOUNTER — Other Ambulatory Visit: Payer: Self-pay | Admitting: Gynecologic Oncology

## 2022-01-23 ENCOUNTER — Telehealth: Payer: Self-pay | Admitting: *Deleted

## 2022-01-23 ENCOUNTER — Inpatient Hospital Stay: Payer: Commercial Managed Care - HMO | Admitting: Gynecologic Oncology

## 2022-01-23 VITALS — BP 140/95 | HR 93 | Temp 97.8°F | Resp 16 | Wt 114.4 lb

## 2022-01-23 DIAGNOSIS — R633 Feeding difficulties, unspecified: Secondary | ICD-10-CM | POA: Diagnosis not present

## 2022-01-23 DIAGNOSIS — N9489 Other specified conditions associated with female genital organs and menstrual cycle: Secondary | ICD-10-CM

## 2022-01-23 DIAGNOSIS — E119 Type 2 diabetes mellitus without complications: Secondary | ICD-10-CM | POA: Diagnosis not present

## 2022-01-23 DIAGNOSIS — N7093 Salpingitis and oophoritis, unspecified: Secondary | ICD-10-CM | POA: Insufficient documentation

## 2022-01-23 DIAGNOSIS — R000 Tachycardia, unspecified: Secondary | ICD-10-CM

## 2022-01-23 DIAGNOSIS — R971 Elevated cancer antigen 125 [CA 125]: Secondary | ICD-10-CM | POA: Insufficient documentation

## 2022-01-23 DIAGNOSIS — R112 Nausea with vomiting, unspecified: Secondary | ICD-10-CM | POA: Diagnosis not present

## 2022-01-23 DIAGNOSIS — R102 Pelvic and perineal pain: Secondary | ICD-10-CM | POA: Diagnosis not present

## 2022-01-23 DIAGNOSIS — E785 Hyperlipidemia, unspecified: Secondary | ICD-10-CM | POA: Diagnosis not present

## 2022-01-23 DIAGNOSIS — N739 Female pelvic inflammatory disease, unspecified: Secondary | ICD-10-CM

## 2022-01-23 DIAGNOSIS — N809 Endometriosis, unspecified: Secondary | ICD-10-CM

## 2022-01-23 DIAGNOSIS — R509 Fever, unspecified: Secondary | ICD-10-CM | POA: Insufficient documentation

## 2022-01-23 DIAGNOSIS — Z90721 Acquired absence of ovaries, unilateral: Secondary | ICD-10-CM | POA: Diagnosis not present

## 2022-01-23 DIAGNOSIS — R197 Diarrhea, unspecified: Secondary | ICD-10-CM | POA: Insufficient documentation

## 2022-01-23 NOTE — Telephone Encounter (Signed)
Spoke with patient and moved her appts from tomorrow to Friday

## 2022-01-23 NOTE — Patient Instructions (Signed)
You are doing well. No sign of infection with the drain today. Monitor the area and call for increased redness, not draining, etc. We will get you set up to meet with the Interventional Radiology team about when the drain can be removed. Please call the office for any needs or concerns.

## 2022-01-23 NOTE — Progress Notes (Signed)
UPT ordered for lab appt on 12/12

## 2022-01-23 NOTE — Telephone Encounter (Signed)
Patient called in stating she is having some drainage and swelling from around her CT drain site. States that last night insertion site was warm to touch and tender as well but that this morning it feels much better, although she is still having drainage and pain from the tube. Rates pain 4/10. Patient had questions about who is managing her care moving forward and who she should be reporting this concern to. Patient advised that Dr Berline Lopes would be notified of her concerns and our office will call her back with recommendations.

## 2022-01-23 NOTE — Telephone Encounter (Signed)
Spoke with the patient and gave appts for this week

## 2022-01-23 NOTE — Telephone Encounter (Signed)
Called patient and scheduled her to come in today to have drain assessed. Patient verbalized understanding.

## 2022-01-24 ENCOUNTER — Other Ambulatory Visit: Payer: Self-pay | Admitting: Gynecologic Oncology

## 2022-01-24 ENCOUNTER — Inpatient Hospital Stay: Payer: Commercial Managed Care - HMO

## 2022-01-24 ENCOUNTER — Ambulatory Visit: Payer: Commercial Managed Care - HMO | Admitting: Gynecologic Oncology

## 2022-01-24 DIAGNOSIS — N7093 Salpingitis and oophoritis, unspecified: Secondary | ICD-10-CM

## 2022-01-24 DIAGNOSIS — N9489 Other specified conditions associated with female genital organs and menstrual cycle: Secondary | ICD-10-CM

## 2022-01-27 ENCOUNTER — Ambulatory Visit: Payer: Commercial Managed Care - HMO

## 2022-01-27 ENCOUNTER — Encounter: Payer: Self-pay | Admitting: Gynecologic Oncology

## 2022-01-27 ENCOUNTER — Inpatient Hospital Stay: Payer: Commercial Managed Care - HMO

## 2022-01-27 ENCOUNTER — Encounter (HOSPITAL_COMMUNITY): Payer: Self-pay | Admitting: Gynecologic Oncology

## 2022-01-27 ENCOUNTER — Inpatient Hospital Stay (HOSPITAL_BASED_OUTPATIENT_CLINIC_OR_DEPARTMENT_OTHER)
Admission: EM | Admit: 2022-01-27 | Discharge: 2022-02-21 | DRG: 742 | Disposition: A | Discharge status: Home / Self Care | Payer: Commercial Managed Care - HMO | Attending: Gynecologic Oncology | Admitting: Gynecologic Oncology

## 2022-01-27 ENCOUNTER — Inpatient Hospital Stay (HOSPITAL_BASED_OUTPATIENT_CLINIC_OR_DEPARTMENT_OTHER)
Admission: RE | Admit: 2022-01-27 | Discharge: 2022-01-27 | Disposition: A | Discharge status: Home / Self Care | Payer: Commercial Managed Care - HMO | Source: Ambulatory Visit | Attending: Gynecologic Oncology | Admitting: Gynecologic Oncology

## 2022-01-27 ENCOUNTER — Emergency Department (HOSPITAL_BASED_OUTPATIENT_CLINIC_OR_DEPARTMENT_OTHER): Payer: Commercial Managed Care - HMO | Admitting: Radiology

## 2022-01-27 ENCOUNTER — Other Ambulatory Visit (HOSPITAL_COMMUNITY): Payer: Self-pay

## 2022-01-27 ENCOUNTER — Other Ambulatory Visit: Payer: Self-pay

## 2022-01-27 ENCOUNTER — Encounter (HOSPITAL_BASED_OUTPATIENT_CLINIC_OR_DEPARTMENT_OTHER): Payer: Self-pay | Admitting: Emergency Medicine

## 2022-01-27 ENCOUNTER — Inpatient Hospital Stay (HOSPITAL_BASED_OUTPATIENT_CLINIC_OR_DEPARTMENT_OTHER): Payer: Commercial Managed Care - HMO | Admitting: Gynecologic Oncology

## 2022-01-27 VITALS — BP 125/81 | HR 112 | Temp 99.3°F | Resp 19 | Ht 62.0 in | Wt 114.0 lb

## 2022-01-27 DIAGNOSIS — R112 Nausea with vomiting, unspecified: Secondary | ICD-10-CM

## 2022-01-27 DIAGNOSIS — Z1152 Encounter for screening for COVID-19: Secondary | ICD-10-CM

## 2022-01-27 DIAGNOSIS — N809 Endometriosis, unspecified: Secondary | ICD-10-CM

## 2022-01-27 DIAGNOSIS — D649 Anemia, unspecified: Secondary | ICD-10-CM

## 2022-01-27 DIAGNOSIS — R000 Tachycardia, unspecified: Secondary | ICD-10-CM

## 2022-01-27 DIAGNOSIS — K9189 Other postprocedural complications and disorders of digestive system: Secondary | ICD-10-CM

## 2022-01-27 DIAGNOSIS — N83201 Unspecified ovarian cyst, right side: Secondary | ICD-10-CM

## 2022-01-27 DIAGNOSIS — N7093 Salpingitis and oophoritis, unspecified: Secondary | ICD-10-CM

## 2022-01-27 DIAGNOSIS — I10 Essential (primary) hypertension: Secondary | ICD-10-CM

## 2022-01-27 DIAGNOSIS — Z9889 Other specified postprocedural states: Secondary | ICD-10-CM

## 2022-01-27 DIAGNOSIS — R141 Gas pain: Secondary | ICD-10-CM

## 2022-01-27 DIAGNOSIS — K567 Ileus, unspecified: Secondary | ICD-10-CM

## 2022-01-27 DIAGNOSIS — Z79899 Other long term (current) drug therapy: Secondary | ICD-10-CM

## 2022-01-27 DIAGNOSIS — A419 Sepsis, unspecified organism: Secondary | ICD-10-CM

## 2022-01-27 DIAGNOSIS — N736 Female pelvic peritoneal adhesions (postinfective): Secondary | ICD-10-CM | POA: Diagnosis present

## 2022-01-27 DIAGNOSIS — D72829 Elevated white blood cell count, unspecified: Secondary | ICD-10-CM

## 2022-01-27 DIAGNOSIS — D75839 Thrombocytosis, unspecified: Secondary | ICD-10-CM

## 2022-01-27 DIAGNOSIS — Z7984 Long term (current) use of oral hypoglycemic drugs: Secondary | ICD-10-CM

## 2022-01-27 DIAGNOSIS — K920 Hematemesis: Secondary | ICD-10-CM | POA: Diagnosis not present

## 2022-01-27 DIAGNOSIS — K56609 Unspecified intestinal obstruction, unspecified as to partial versus complete obstruction: Secondary | ICD-10-CM

## 2022-01-27 DIAGNOSIS — K682 Retroperitoneal fibrosis: Secondary | ICD-10-CM | POA: Diagnosis present

## 2022-01-27 DIAGNOSIS — Z932 Ileostomy status: Secondary | ICD-10-CM

## 2022-01-27 DIAGNOSIS — D75838 Other thrombocytosis: Secondary | ICD-10-CM | POA: Diagnosis not present

## 2022-01-27 DIAGNOSIS — Z8249 Family history of ischemic heart disease and other diseases of the circulatory system: Secondary | ICD-10-CM

## 2022-01-27 DIAGNOSIS — R7302 Impaired glucose tolerance (oral): Secondary | ICD-10-CM

## 2022-01-27 DIAGNOSIS — E860 Dehydration: Secondary | ICD-10-CM | POA: Diagnosis present

## 2022-01-27 DIAGNOSIS — N80109 Endometriosis of ovary, unspecified side, unspecified depth: Secondary | ICD-10-CM

## 2022-01-27 DIAGNOSIS — K56600 Partial intestinal obstruction, unspecified as to cause: Secondary | ICD-10-CM | POA: Diagnosis not present

## 2022-01-27 DIAGNOSIS — E785 Hyperlipidemia, unspecified: Secondary | ICD-10-CM | POA: Diagnosis present

## 2022-01-27 DIAGNOSIS — R030 Elevated blood-pressure reading, without diagnosis of hypertension: Secondary | ICD-10-CM

## 2022-01-27 DIAGNOSIS — E876 Hypokalemia: Secondary | ICD-10-CM

## 2022-01-27 DIAGNOSIS — L299 Pruritus, unspecified: Secondary | ICD-10-CM

## 2022-01-27 DIAGNOSIS — N179 Acute kidney failure, unspecified: Secondary | ICD-10-CM | POA: Diagnosis not present

## 2022-01-27 DIAGNOSIS — N1330 Unspecified hydronephrosis: Secondary | ICD-10-CM | POA: Diagnosis present

## 2022-01-27 DIAGNOSIS — F411 Generalized anxiety disorder: Secondary | ICD-10-CM

## 2022-01-27 DIAGNOSIS — N83209 Unspecified ovarian cyst, unspecified side: Secondary | ICD-10-CM

## 2022-01-27 DIAGNOSIS — N9489 Other specified conditions associated with female genital organs and menstrual cycle: Secondary | ICD-10-CM

## 2022-01-27 DIAGNOSIS — Z83438 Family history of other disorder of lipoprotein metabolism and other lipidemia: Secondary | ICD-10-CM

## 2022-01-27 DIAGNOSIS — E1165 Type 2 diabetes mellitus with hyperglycemia: Secondary | ICD-10-CM | POA: Diagnosis present

## 2022-01-27 DIAGNOSIS — B001 Herpesviral vesicular dermatitis: Secondary | ICD-10-CM

## 2022-01-27 DIAGNOSIS — R1084 Generalized abdominal pain: Secondary | ICD-10-CM

## 2022-01-27 DIAGNOSIS — T368X5A Adverse effect of other systemic antibiotics, initial encounter: Secondary | ICD-10-CM | POA: Diagnosis not present

## 2022-01-27 LAB — PREGNANCY, URINE
Preg Test, Ur: NEGATIVE
Preg Test, Ur: NEGATIVE

## 2022-01-27 LAB — CBC WITH DIFFERENTIAL/PLATELET
Abs Immature Granulocytes: 0.07 10*3/uL (ref 0.00–0.07)
Basophils Absolute: 0 10*3/uL (ref 0.0–0.1)
Basophils Relative: 0 %
Eosinophils Absolute: 0 10*3/uL (ref 0.0–0.5)
Eosinophils Relative: 0 %
HCT: 30.3 % — ABNORMAL LOW (ref 36.0–46.0)
Hemoglobin: 9.6 g/dL — ABNORMAL LOW (ref 12.0–15.0)
Immature Granulocytes: 0 %
Lymphocytes Relative: 18 %
Lymphs Abs: 3.1 10*3/uL (ref 0.7–4.0)
MCH: 24.9 pg — ABNORMAL LOW (ref 26.0–34.0)
MCHC: 31.7 g/dL (ref 30.0–36.0)
MCV: 78.5 fL — ABNORMAL LOW (ref 80.0–100.0)
Monocytes Absolute: 0.9 10*3/uL (ref 0.1–1.0)
Monocytes Relative: 5 %
Neutro Abs: 12.7 10*3/uL — ABNORMAL HIGH (ref 1.7–7.7)
Neutrophils Relative %: 77 %
Platelets: 386 10*3/uL (ref 150–400)
RBC: 3.86 MIL/uL — ABNORMAL LOW (ref 3.87–5.11)
RDW: 17.2 % — ABNORMAL HIGH (ref 11.5–15.5)
WBC: 16.8 10*3/uL — ABNORMAL HIGH (ref 4.0–10.5)
nRBC: 0 % (ref 0.0–0.2)

## 2022-01-27 LAB — COMPREHENSIVE METABOLIC PANEL
ALT: 16 U/L (ref 0–44)
ALT: 14 U/L (ref 0–44)
AST: 16 U/L (ref 15–41)
AST: 15 U/L (ref 15–41)
Albumin: 4 g/dL (ref 3.5–5.0)
Albumin: 3.9 g/dL (ref 3.5–5.0)
Alkaline Phosphatase: 158 U/L — ABNORMAL HIGH (ref 38–126)
Alkaline Phosphatase: 136 U/L — ABNORMAL HIGH (ref 38–126)
Anion gap: 8 (ref 5–15)
Anion gap: 15 (ref 5–15)
BUN: 6 mg/dL (ref 6–20)
BUN: 5 mg/dL — ABNORMAL LOW (ref 6–20)
CO2: 25 mmol/L (ref 22–32)
CO2: 19 mmol/L — ABNORMAL LOW (ref 22–32)
Calcium: 9.9 mg/dL (ref 8.9–10.3)
Calcium: 9.3 mg/dL (ref 8.9–10.3)
Chloride: 102 mmol/L (ref 98–111)
Chloride: 99 mmol/L (ref 98–111)
Creatinine, Ser: 0.53 mg/dL (ref 0.44–1.00)
Creatinine, Ser: 0.42 mg/dL — ABNORMAL LOW (ref 0.44–1.00)
GFR, Estimated: >60 mL/min (ref >60)
GFR, Estimated: >60 mL/min (ref >60)
Glucose, Bld: 108 mg/dL — ABNORMAL HIGH (ref 70–99)
Glucose, Bld: 91 mg/dL (ref 70–99)
Potassium: 3.3 mmol/L — ABNORMAL LOW (ref 3.5–5.1)
Potassium: 2.9 mmol/L — ABNORMAL LOW (ref 3.5–5.1)
Sodium: 135 mmol/L (ref 135–145)
Sodium: 133 mmol/L — ABNORMAL LOW (ref 135–145)
Total Bilirubin: 0.7 mg/dL (ref 0.3–1.2)
Total Bilirubin: 0.8 mg/dL (ref 0.3–1.2)
Total Protein: 8.3 g/dL — ABNORMAL HIGH (ref 6.5–8.1)
Total Protein: 8.4 g/dL — ABNORMAL HIGH (ref 6.5–8.1)

## 2022-01-27 LAB — URINALYSIS, ROUTINE W REFLEX MICROSCOPIC
Bilirubin Urine: NEGATIVE
Glucose, UA: >1000 mg/dL — AB
Hgb urine dipstick: NEGATIVE
Ketones, ur: 15 mg/dL — AB
Leukocytes,Ua: NEGATIVE
Nitrite: NEGATIVE
Specific Gravity, Urine: 1.037 — ABNORMAL HIGH (ref 1.005–1.030)
pH: 5.5 (ref 5.0–8.0)

## 2022-01-27 LAB — CBC WITH DIFFERENTIAL (CANCER CENTER ONLY)
Abs Immature Granulocytes: 0.06 10*3/uL (ref 0.00–0.07)
Basophils Absolute: 0 10*3/uL (ref 0.0–0.1)
Basophils Relative: 0 %
Eosinophils Absolute: 0 10*3/uL (ref 0.0–0.5)
Eosinophils Relative: 0 %
HCT: 34.3 % — ABNORMAL LOW (ref 36.0–46.0)
Hemoglobin: 10.9 g/dL — ABNORMAL LOW (ref 12.0–15.0)
Immature Granulocytes: 0 %
Lymphocytes Relative: 15 %
Lymphs Abs: 2.1 10*3/uL (ref 0.7–4.0)
MCH: 25.1 pg — ABNORMAL LOW (ref 26.0–34.0)
MCHC: 31.8 g/dL (ref 30.0–36.0)
MCV: 79 fL — ABNORMAL LOW (ref 80.0–100.0)
Monocytes Absolute: 0.6 10*3/uL (ref 0.1–1.0)
Monocytes Relative: 4 %
Neutro Abs: 11.6 10*3/uL — ABNORMAL HIGH (ref 1.7–7.7)
Neutrophils Relative %: 81 %
Platelet Count: 385 10*3/uL (ref 150–400)
RBC: 4.34 MIL/uL (ref 3.87–5.11)
RDW: 17.2 % — ABNORMAL HIGH (ref 11.5–15.5)
WBC Count: 14.5 10*3/uL — ABNORMAL HIGH (ref 4.0–10.5)
nRBC: 0 % (ref 0.0–0.2)

## 2022-01-27 LAB — RESP PANEL BY RT-PCR (RSV, FLU A&B, COVID)  RVPGX2
Influenza A by PCR: NEGATIVE
Influenza B by PCR: NEGATIVE
Resp Syncytial Virus by PCR: NEGATIVE
SARS Coronavirus 2 by RT PCR: NEGATIVE

## 2022-01-27 LAB — MAGNESIUM: Magnesium: 1.7 mg/dL (ref 1.7–2.4)

## 2022-01-27 LAB — LIPASE, BLOOD: Lipase: 29 U/L (ref 11–51)

## 2022-01-27 LAB — LACTIC ACID, PLASMA: Lactic Acid, Venous: 0.7 mmol/L (ref 0.5–1.9)

## 2022-01-27 MED ORDER — IOHEXOL 300 MG/ML  SOLN
80.0000 mL | Freq: Once | INTRAMUSCULAR | Status: AC | PRN
  Administered 2022-01-27: 80 mL via INTRAVENOUS

## 2022-01-27 MED ORDER — SODIUM CHLORIDE 0.9 % IV BOLUS
1000.0000 mL | Freq: Once | INTRAVENOUS | Status: AC
  Administered 2022-01-27: 1000 mL via INTRAVENOUS

## 2022-01-27 MED ORDER — LACTATED RINGERS IV BOLUS
1000.0000 mL | Freq: Once | INTRAVENOUS | Status: AC
  Administered 2022-01-28: 1000 mL via INTRAVENOUS

## 2022-01-27 MED ORDER — POTASSIUM CHLORIDE 10 MEQ/100ML IV SOLN
10.0000 meq | Freq: Once | INTRAVENOUS | Status: AC
  Administered 2022-01-27: 10 meq via INTRAVENOUS
  Filled 2022-01-27: qty 100

## 2022-01-27 MED ORDER — IOHEXOL 9 MG/ML PO SOLN
500.0000 mL | ORAL | Status: DC
  Administered 2022-01-27: 500 mL via ORAL

## 2022-01-27 MED ORDER — LACTATED RINGERS IV BOLUS
500.0000 mL | Freq: Once | INTRAVENOUS | Status: AC
  Administered 2022-01-27: 500 mL via INTRAVENOUS

## 2022-01-27 MED ORDER — VANCOMYCIN HCL IN DEXTROSE 1-5 GM/200ML-% IV SOLN
1000.0000 mg | INTRAVENOUS | Status: DC
  Administered 2022-01-27 – 2022-02-04 (×8): 1000 mg via INTRAVENOUS
  Filled 2022-01-27 (×9): qty 200

## 2022-01-27 MED ORDER — SODIUM CHLORIDE 0.9 % IV SOLN
2.0000 g | Freq: Three times a day (TID) | INTRAVENOUS | Status: DC
  Administered 2022-01-27 – 2022-01-29 (×5): 2 g via INTRAVENOUS
  Filled 2022-01-27 (×5): qty 12.5

## 2022-01-27 NOTE — ED Notes (Addendum)
Dr. Ernestina Patches [phone] - Please call for orders once patient arrives to [facility]

## 2022-01-27 NOTE — Progress Notes (Signed)
Gynecologic Oncology Return Clinic Visit  01/27/22  Reason for Visit: Follow-up  Treatment History: The patient was initially referred to Dr. 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 A pelvic mass was identified on pelvic examination and an ultrasound obtained showing:   Uterus measures 6.8 x 5.5 x 4.8 cm endometrial stripe 10.9 mm (last menstrual period 08/18/2014). The right ovary will there was a corpus luteum cyst was measured 14 x 16 x 14 mm with noted color flow in the periphery. Left ovary not seen but a large adnexal mass measuring 12.1 x 9.4 x 8.4 cm defect using internal low level echoes homogeneous echoes along with a solid calcification in the mass which measured 3.6 x 1.3 x 2.0 cm with arterial blood flow seen within the wall of the mass. There was no fluid in the cul-de-sac.   Follow-up CT scan was obtained showing: 1. 11.8 cm multiloculated cystic mass in the right adnexal space, concerning for neoplasm. While there is some mild smooth wall thickening along the medial margin of the lesion, there is no evidence for mural nodule or septal irregularity/thickening by CT and this lesion may be benign. 2. No evidence for ascites. 3. No lymphadenopathy in the pelvis.   CA-125 was 164 units per mL. Roma score was 2.83 (normal for a premenopausal woman is less than 1.31)   09/2014: Robotic-assisted left salpingo-oophorectomy with lysis of adhesions x 2 hours and left ureterolysis for retroperitoneal fibrosis obliterating the pararectal space. Findings at the time of surgery included retroperitoneal left ovarian mass measuring 12cm, replacing left ovary, densely adherent to sigmoid colon, left ovarian fossa and uterus and infiltrating into the retroperitoneum on the left causing adherence to the left ureter which required extensive ureterolysis to mobilize off the mass. The rectum was adherent to the posterior uterine lower segment and cervix and the right ovary was grossly normal with  a corpus luteum. The right fallopian tube was slightly clubbed. Left ovarian cyst consistent with endometrioma with chocolate colored fluid.  Pathology confirmed endometrioma/endometriosis.   After this surgery, the patient underwent HSG which documented patency of her right fallopian tube.  She then conceived spontaneously in April 2017.  This resulted in a 6-week miscarriage.  She underwent stimulation cycles with IUI in 2019 but did not conceive.  She then spontaneously conceived in October 2019, again resulting in a early pregnancy loss.  She required Vasoprost all for retained products of conception.   Subsequently, she had 3 IVF cycles, none of which produced viable and genetically normal embryos.  The last of these cycles was in 2022.   Patient was seen by Dr. Kerin Perna at the end of August 2023 to discuss possibility of fourth IVF cycle.  Labs at that initial visit included a CA125 of 31.2.  At some point subsequently, she underwent cyst aspiration in preparation for upcoming IVF cycle.  She had been taking Orilissa for the last month before this procedure happened.  She underwent transvaginal ultrasound guided ovarian endometrioma aspiration with 24 mm endometrioma aspirated.  She had doxycycline prophylaxis for this procedure and notes indicate that she was prescribed 5 days of doxycycline after the procedure.   The patient was seen back in clinic in early November.  She had abdominal pain at this time after stopping her Freida Busman 1 week previously after running out of medication.  Pelvic ultrasound at that time showed a right endometrioma measuring 4 x 3.1 x 3.7 cm and a resolving left anechoic cystic structure measuring up to 2.3  cm.  No free fluid noted in the pelvis.  Started on Aygestin and Solara for 2 weeks to help suppress estrogen.     Plan had been for her to be on medications for 2 weeks and then to follow-up in clinic.  Given pain and fevers, she presented to the emergency department  about a week after this visit.  In the emergency department, the patient was noted to have leukocytosis of 25.  She was febrile and tachycardic on admission.  Cultures were drawn.  CT scan and ultrasound were performed with results noted below.   12/26/21: CT A/P 1. Complex, multi-septated cystic right adnexal mass measuring up to 9.2 cm in diameter, new compared with previous CT from 2016. Appearance is nonspecific and could reflect an atypical endometrioma or tubo-ovarian abscess. Correlate with recent surgical procedure/findings and presumed outside imaging. Neoplasm not excluded by this examination. Ultrasound may be helpful for further characterization. 2. Mild pelvic soft tissue edema without focal fluid collection, ascites or free air. The appendix is not clearly demonstrated. 3. Mildly prominent retroperitoneal lymph nodes, likely reactive. 4. No acute findings in the chest.   12/26/21: Pelvic ultrasound  There is marked enlargement of right ovary. There are multiple smooth marginated lesions of varying sizes largest measuring 6.6 cm in maximum diameter. Findings suggestive multiple hemorrhagic right ovarian cysts. There is no evidence of right ovarian torsion. Left ovary is surgically absent. There is inhomogeneous echogenicity in myometrium without no discrete focal abnormalities.   Patient was admitted and treated with IV cefepime, flagyl, vancomycin. Urine culture was negative negative, wet prep showed clue cells, blood cultures were also negative. She was discharged home on Augmentin and doxycycline for 10 days.   She was seen for follow-up on November 29.  Pelvic ultrasound performed in clinic showed a right tubo-ovarian complex adherent to the uterus measured 10.6 x 8.8 x 7.8 cm.  01/13/22: Patient was seen in my office and secondary to abdominal pain, tachycardia, and fevers over the last couple of weeks, patient sent to the emergency department for admission.  12/1-12/8/23: Patient  admitted, started on IV Zosyn.  Given outpatient treatment failure in size of mass, interventional radiology consulted.  Drain placed in the largest cystic area of the ovary.  Gram stain showed rare WBC, few gram-positive cocci and rare gram-negative rods.  Culture revealed moderate PREVOTELLA BIVIA.  Given improved symptoms and leukocytosis, patient transitioned from Zosyn to levofloxacin and Flagyl.  Unfortunately, she did not tolerate this and was transitioned to Augmentin.  Given worsening clinical symptoms as well as worsening leukocytosis, she was transition back to Zosyn and infectious diseases was consulted.  Decision made to discharge her on prolonged course of IV Zosyn with interval plan for surgery.  Interval History: Patient reports that she had been doing well at home until yesterday.  Starting yesterday, she has been febrile up to 101.  She feels achy and weak today.  This morning, she had some nausea and felt like she was going to throw up but did not.  She denies any sick contacts.  She is continued with the IV antibiotics at home.  She is flushing her drain twice a day with virtually no output since leaving the hospital.  Until yesterday, she endorses a good appetite.  She denies any abdominal pain, sometimes feels a pulling sensation on the right side when she lays down related to the drain.  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   no meds 1997, treated in June 2016 x 1 month- states neg chest x ray June 2016    Past Surgical History:  Procedure Laterality Date   LAPAROSCOPIC UNILATERAL SALPINGO OOPHERECTOMY N/A 09/29/2014   Procedure: LAPAROSCOPIC LEFT SALPINGO OOPHORECTOMY  ADHESIOLYSIS ;  Surgeon: Everitt Amber, MD;  Location: [facility];  Service: Gynecology;  Laterality: N/A;   ROBOTIC ASSISTED LAPAROSCOPIC OVARIAN CYSTECTOMY N/A 09/29/2014   Procedure: SI ROBOTIC ASSISTED LAPAROSCOPIC OVARIAN CYSTECTOMY;  Surgeon: Everitt Amber, MD;  Location: [facility];  Service: Gynecology;  Laterality: N/A;   TYMPANOPLASTY Left 08/19/2019   Procedure: LEFT TYMPANOPLASTY;  Surgeon: Rozetta Nunnery, MD;  Location: [facility];  Service: ENT;  Laterality: Left;    Family History  Problem Relation Age of Onset   Hyperlipidemia Mother    Hypertension Mother    Hyperlipidemia Father     Social History   Socioeconomic History   Marital status: Married    Spouse name: Not on file   Number of children: 0   Years of education: Not on file   Highest education level: Not on file  Occupational History   Occupation: Metallurgist  Tobacco Use   Smoking status: Never   Smokeless tobacco: Never  Vaping Use   Vaping Use: Never used  Substance and Sexual Activity   Alcohol use: Not Currently    Alcohol/week: 0.0 standard drinks of alcohol   Drug use: No   Sexual activity: Not Currently    Partners: Male    Birth control/protection: None  Other Topics Concern   Not on file  Social History Narrative   Marital status:  Single; together x 8 years.      Children: none      Lives: with boyfriend; from [place]; moved to [place] at age 67.      Employment:  Cytogeneticist.      Tobacco: none      Alcohol: weekends      Drugs: none      Exercise:  Sporadic      Seatbelt: 100%; no texting   Social Determinants of Health   Financial Resource Strain: Not on file  Food Insecurity: No Food Insecurity (01/13/2022)   Hunger Vital Sign    Worried About Running Out of Food in the Last Year: Never true    Ran Out of Food in the Last Year: Never true  Transportation Needs: No Transportation Needs (01/13/2022)   PRAPARE - Civil engineer, contracting (Medical): No    Lack of Transportation (Non-Medical): No  Physical Activity: Not on file  Stress: Not on file  Social Connections: Not on file    Current Medications:  Current Outpatient Medications:    empagliflozin (JARDIANCE) 25 MG TABS tablet, Take 25 mg by mouth daily., Disp: , Rfl:    ibuprofen (ADVIL) 200 MG tablet, Take 400 mg by mouth every 6 (six) hours as needed for moderate pain., Disp: , Rfl:    metFORMIN (GLUCOPHAGE-XR) 500 MG 24 hr tablet, Take 1,000 mg by mouth 2 (two) times daily with a meal., Disp: , Rfl:    piperacillin-tazobactam (ZOSYN) IVPB, Inject 13.5 g into the  vein daily for 13 days. Indication:  tubo-ovarian abscess First Dose: No Last Day of Therapy:  02/02/22 Labs - Once weekly:  CBC/D and BMP, Labs - Every other week:  ESR and CRP Method of administration: Administer Zosyn 13.5g IV daily as a continuous infusion.   Elastomeric (Continuous infusion) Method of administration may be changed at the discretion of home infusion pharmacist based upon assessment of the patient and/or caregiver's ability to self-administer the medication ordered., Disp: 13 Units, Rfl: 0   rosuvastatin (CRESTOR) 40 MG tablet, Take 40 mg by mouth daily., Disp: , Rfl:    sodium chloride flush (NS) 0.9 % SOLN, Flush right lower quadrant drain with 5-10 mL normal saline every day., Disp: 200 mL, Rfl: 0   traMADol (ULTRAM) 50 MG tablet, Take 50 mg by mouth every 6 (six) hours as needed for moderate pain or severe pain., Disp: , Rfl:   Review of Systems: + Fever/chills, abdominal pain, urinary frequency, dizziness.  Voice changes. Denies appetite changes, unexplained weight changes. Denies hearing loss, neck lumps or masses, mouth sores, ringing in ears. Denies cough or wheezing.  Denies shortness of breath. Denies chest pain or palpitations. Denies leg swelling. Denies abdominal distention, blood in stools, constipation, diarrhea, nausea, vomiting, or early satiety. Denies  pain with intercourse, dysuria, hematuria or incontinence. Denies hot flashes, pelvic pain, vaginal bleeding or vaginal discharge.   Denies joint pain, back pain or muscle pain/cramps. Denies itching, rash, or wounds. Denies headaches, numbness or seizures. Denies swollen lymph nodes or glands, denies easy bruising or bleeding. Denies anxiety, depression, confusion, or decreased concentration.  Physical Exam: BP 125/81 (BP Location: Right Arm, Patient Position: Sitting)   Pulse (!) 112   Temp 99.3 F (37.4 C) (Oral)   Resp 19   Ht _0  (1.575 m)   Wt 114 lb (51.7 kg)   LMP 12/28/2021   SpO2 99%   BMI 20.85 kg/m  General: Alert, oriented, no acute distress. HEENT: Normocephalic, atraumatic, sclera anicteric. Chest: Clear to auscultation bilaterally.  No wheezes or rhonchi. Cardiovascular: Mildly tachycardic, regular rhythm, no murmurs. Abdomen: soft, nontender, mildly distended.  Normoactive bowel sounds.  No masses or hepatosplenomegaly appreciated.  Drain in place, minimal serous fluid within the drain tubing and drain itself.  Drain stripped. Extremities: Grossly normal range of motion.  Warm, well perfused.  No edema bilaterally.  Laboratory & Radiologic Studies:    Latest Ref Rng & Units 01/27/2022   12:02 PM 01/20/2022    4:42 AM 01/19/2022    5:09 AM  CBC  WBC 4.0 - 10.5 K/uL 14.5  13.0  14.7   Hemoglobin 12.0 - 15.0 g/dL 10.9  9.2  9.4   Hematocrit 36.0 - 46.0 % 34.3  30.2  30.6   Platelets 150 - 400 K/uL 385  620  574       Latest Ref Rng & Units 01/27/2022   12:02 PM 01/20/2022    4:42 AM 01/19/2022    5:09 AM  BMP  Glucose 70 - 99 mg/dL 108  119  117   BUN 6 - 20 mg/dL _1 Creatinine 0.44 - 1.00 mg/dL 0.53  0.55  0.57   Sodium 135 - 145 mmol/L 135  134  137   Potassium 3.5 - 5.1 mmol/L 3.3  3.5  3.7   Chloride 98 - 111 mmol/L 102  97  101   CO2 22 - 32 mmol/L _2 Calcium  8.9 - 10.3 mg/dL 9.9  9.4  9.6     Assessment & Plan: Marie Hill is a  41 y.o. woman with known history of endometriosis who developed TOA after outpatient REI procedure (transvaginal endometrioma drainage) that failed outpatient therapy, most recently admitted and restarted on IV antibiotics which she has continued outpatient, now presenting with a day and a half of worsening symptoms.  Discussed with the patient her increasing white count.  While not significantly higher than at the time of discharge, given her continued Zosyn during this period and her worsening symptoms, I recommend admission with planning for surgery over the next few days.  We had tentatively plan for surgery towards the end of the month after a prolonged course of IV antibiotics.  I think it is unlikely, but she likely needs to get tested for COVID, other respiratory illnesses.  Given ongoing infection, the patient could become quite sick after definitive surgery.  I had initially planned to have the patient admitted to [facility] with surgery early next week.  Unfortunately, her insurance is out of network there.  Given this, I spoke with our OR and have her posted for the afternoon on Monday.  We talked about admitting and the emergency department has multiple people waiting for a bed and there are multiple surgical admits waiting for a bed.  Given this, we will plan to get a CT of the abdomen and pelvis at [facility].  We found out that the patient would not be able to be admitted to [facility], we advised her to present to the emergency department at [facility].  Hopefully she can be admitted there and transferred to [facility].  We briefly discussed plan for surgery on Monday.  While the patient still wishes to feel better, she is interested in keeping her uterus.  She is understanding of the uterus needs to be removed at the time of surgery.  If frank infection encountered, it may decrease risk of complications postoperatively from a healing standpoint to defer hysterectomy at this time.  38 minutes of total  time was spent for this patient encounter, including preparation, face-to-face counseling with the patient and coordination of care, and documentation of the encounter.  Jeral Pinch, MD  Division of Gynecologic Oncology  Department of Obstetrics and Gynecology  [facility]

## 2022-01-27 NOTE — Patient Instructions (Signed)
Plan to go to [facility] for a CT scan now. We will contact you with the results.

## 2022-01-27 NOTE — ED Triage Notes (Addendum)
Pt presents to ED POV from surgery office d/t WBC increasing. Pt c/o fever that began yesterday. Temp at home was 101. Pt hosp multiple times in past couple months for "ovary infection."   Blood work and CTA of abd done today

## 2022-01-27 NOTE — H&P (View-Only) (Signed)
Gynecologic Oncology Return Clinic Visit  01/27/22  Reason for Visit: Follow-up  Treatment History: The patient was initially referred to Dr. 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 A pelvic mass was identified on pelvic examination and an ultrasound obtained showing:   Uterus measures 6.8 x 5.5 x 4.8 cm endometrial stripe 10.9 mm (last menstrual period 08/18/2014). The right ovary will there was a corpus luteum cyst was measured 14 x 16 x 14 mm with noted color flow in the periphery. Left ovary not seen but a large adnexal mass measuring 12.1 x 9.4 x 8.4 cm defect using internal low level echoes homogeneous echoes along with a solid calcification in the mass which measured 3.6 x 1.3 x 2.0 cm with arterial blood flow seen within the wall of the mass. There was no fluid in the cul-de-sac.   Follow-up CT scan was obtained showing: 1. 11.8 cm multiloculated cystic mass in the right adnexal space, concerning for neoplasm. While there is some mild smooth wall thickening along the medial margin of the lesion, there is no evidence for mural nodule or septal irregularity/thickening by CT and this lesion may be benign. 2. No evidence for ascites. 3. No lymphadenopathy in the pelvis.   CA-125 was 164 units per mL. Roma score was 2.83 (normal for a premenopausal woman is less than 1.31)   09/2014: Robotic-assisted left salpingo-oophorectomy with lysis of adhesions x 2 hours and left ureterolysis for retroperitoneal fibrosis obliterating the pararectal space. Findings at the time of surgery included retroperitoneal left ovarian mass measuring 12cm, replacing left ovary, densely adherent to sigmoid colon, left ovarian fossa and uterus and infiltrating into the retroperitoneum on the left causing adherence to the left ureter which required extensive ureterolysis to mobilize off the mass. The rectum was adherent to the posterior uterine lower segment and cervix and the right ovary was grossly normal with  a corpus luteum. The right fallopian tube was slightly clubbed. Left ovarian cyst consistent with endometrioma with chocolate colored fluid.  Pathology confirmed endometrioma/endometriosis.   After this surgery, the patient underwent HSG which documented patency of her right fallopian tube.  She then conceived spontaneously in April 2017.  This resulted in a 6-week miscarriage.  She underwent stimulation cycles with IUI in 2019 but did not conceive.  She then spontaneously conceived in October 2019, again resulting in a early pregnancy loss.  She required Vasoprost all for retained products of conception.   Subsequently, she had 3 IVF cycles, none of which produced viable and genetically normal embryos.  The last of these cycles was in 2022.   Patient was seen by Dr. Kerin Perna at the end of August 2023 to discuss possibility of fourth IVF cycle.  Labs at that initial visit included a CA125 of 31.2.  At some point subsequently, she underwent cyst aspiration in preparation for upcoming IVF cycle.  She had been taking Orilissa for the last month before this procedure happened.  She underwent transvaginal ultrasound guided ovarian endometrioma aspiration with 24 mm endometrioma aspirated.  She had doxycycline prophylaxis for this procedure and notes indicate that she was prescribed 5 days of doxycycline after the procedure.   The patient was seen back in clinic in early November.  She had abdominal pain at this time after stopping her Freida Busman 1 week previously after running out of medication.  Pelvic ultrasound at that time showed a right endometrioma measuring 4 x 3.1 x 3.7 cm and a resolving left anechoic cystic structure measuring up to 2.3  cm.  No free fluid noted in the pelvis.  Started on Aygestin and Solara for 2 weeks to help suppress estrogen.     Plan had been for her to be on medications for 2 weeks and then to follow-up in clinic.  Given pain and fevers, she presented to the emergency department  about a week after this visit.  In the emergency department, the patient was noted to have leukocytosis of 25.  She was febrile and tachycardic on admission.  Cultures were drawn.  CT scan and ultrasound were performed with results noted below.   12/26/21: CT A/P 1. Complex, multi-septated cystic right adnexal mass measuring up to 9.2 cm in diameter, new compared with previous CT from 2016. Appearance is nonspecific and could reflect an atypical endometrioma or tubo-ovarian abscess. Correlate with recent surgical procedure/findings and presumed outside imaging. Neoplasm not excluded by this examination. Ultrasound may be helpful for further characterization. 2. Mild pelvic soft tissue edema without focal fluid collection, ascites or free air. The appendix is not clearly demonstrated. 3. Mildly prominent retroperitoneal lymph nodes, likely reactive. 4. No acute findings in the chest.   12/26/21: Pelvic ultrasound  There is marked enlargement of right ovary. There are multiple smooth marginated lesions of varying sizes largest measuring 6.6 cm in maximum diameter. Findings suggestive multiple hemorrhagic right ovarian cysts. There is no evidence of right ovarian torsion. Left ovary is surgically absent. There is inhomogeneous echogenicity in myometrium without no discrete focal abnormalities.   Patient was admitted and treated with IV cefepime, flagyl, vancomycin. Urine culture was negative negative, wet prep showed clue cells, blood cultures were also negative. She was discharged home on Augmentin and doxycycline for 10 days.   She was seen for follow-up on November 29.  Pelvic ultrasound performed in clinic showed a right tubo-ovarian complex adherent to the uterus measured 10.6 x 8.8 x 7.8 cm.  01/13/22: Patient was seen in my office and secondary to abdominal pain, tachycardia, and fevers over the last couple of weeks, patient sent to the emergency department for admission.  12/1-12/8/23: Patient  admitted, started on IV Zosyn.  Given outpatient treatment failure in size of mass, interventional radiology consulted.  Drain placed in the largest cystic area of the ovary.  Gram stain showed rare WBC, few gram-positive cocci and rare gram-negative rods.  Culture revealed moderate PREVOTELLA BIVIA.  Given improved symptoms and leukocytosis, patient transitioned from Zosyn to levofloxacin and Flagyl.  Unfortunately, she did not tolerate this and was transitioned to Augmentin.  Given worsening clinical symptoms as well as worsening leukocytosis, she was transition back to Zosyn and infectious diseases was consulted.  Decision made to discharge her on prolonged course of IV Zosyn with interval plan for surgery.  Interval History: Patient reports that she had been doing well at home until yesterday.  Starting yesterday, she has been febrile up to 101.  She feels achy and weak today.  This morning, she had some nausea and felt like she was going to throw up but did not.  She denies any sick contacts.  She is continued with the IV antibiotics at home.  She is flushing her drain twice a day with virtually no output since leaving the hospital.  Until yesterday, she endorses a good appetite.  She denies any abdominal pain, sometimes feels a pulling sensation on the right side when she lays down related to the drain.  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   no meds 1997, treated in June 2016 x 1 month- states neg chest x ray June 2016    Past Surgical History:  Procedure Laterality Date   LAPAROSCOPIC UNILATERAL SALPINGO OOPHERECTOMY N/A 09/29/2014   Procedure: LAPAROSCOPIC LEFT SALPINGO OOPHORECTOMY  ADHESIOLYSIS ;  Surgeon: Everitt Amber, MD;  Location: [facility];  Service: Gynecology;  Laterality: N/A;   ROBOTIC ASSISTED LAPAROSCOPIC OVARIAN CYSTECTOMY N/A 09/29/2014   Procedure: SI ROBOTIC ASSISTED LAPAROSCOPIC OVARIAN CYSTECTOMY;  Surgeon: Everitt Amber, MD;  Location: [facility];  Service: Gynecology;  Laterality: N/A;   TYMPANOPLASTY Left 08/19/2019   Procedure: LEFT TYMPANOPLASTY;  Surgeon: Rozetta Nunnery, MD;  Location: [facility];  Service: ENT;  Laterality: Left;    Family History  Problem Relation Age of Onset   Hyperlipidemia Mother    Hypertension Mother    Hyperlipidemia Father     Social History   Socioeconomic History   Marital status: Married    Spouse name: Not on file   Number of children: 0   Years of education: Not on file   Highest education level: Not on file  Occupational History   Occupation: Metallurgist  Tobacco Use   Smoking status: Never   Smokeless tobacco: Never  Vaping Use   Vaping Use: Never used  Substance and Sexual Activity   Alcohol use: Not Currently    Alcohol/week: 0.0 standard drinks of alcohol   Drug use: No   Sexual activity: Not Currently    Partners: Male    Birth control/protection: None  Other Topics Concern   Not on file  Social History Narrative   Marital status:  Single; together x 8 years.      Children: none      Lives: with boyfriend; from [place]; moved to [place] at age 67.      Employment:  Cytogeneticist.      Tobacco: none      Alcohol: weekends      Drugs: none      Exercise:  Sporadic      Seatbelt: 100%; no texting   Social Determinants of Health   Financial Resource Strain: Not on file  Food Insecurity: No Food Insecurity (01/13/2022)   Hunger Vital Sign    Worried About Running Out of Food in the Last Year: Never true    Ran Out of Food in the Last Year: Never true  Transportation Needs: No Transportation Needs (01/13/2022)   PRAPARE - Civil engineer, contracting (Medical): No    Lack of Transportation (Non-Medical): No  Physical Activity: Not on file  Stress: Not on file  Social Connections: Not on file    Current Medications:  Current Outpatient Medications:    empagliflozin (JARDIANCE) 25 MG TABS tablet, Take 25 mg by mouth daily., Disp: , Rfl:    ibuprofen (ADVIL) 200 MG tablet, Take 400 mg by mouth every 6 (six) hours as needed for moderate pain., Disp: , Rfl:    metFORMIN (GLUCOPHAGE-XR) 500 MG 24 hr tablet, Take 1,000 mg by mouth 2 (two) times daily with a meal., Disp: , Rfl:    piperacillin-tazobactam (ZOSYN) IVPB, Inject 13.5 g into the  vein daily for 13 days. Indication:  tubo-ovarian abscess First Dose: No Last Day of Therapy:  02/02/22 Labs - Once weekly:  CBC/D and BMP, Labs - Every other week:  ESR and CRP Method of administration: Administer Zosyn 13.5g IV daily as a continuous infusion.   Elastomeric (Continuous infusion) Method of administration may be changed at the discretion of home infusion pharmacist based upon assessment of the patient and/or caregiver's ability to self-administer the medication ordered., Disp: 13 Units, Rfl: 0   rosuvastatin (CRESTOR) 40 MG tablet, Take 40 mg by mouth daily., Disp: , Rfl:    sodium chloride flush (NS) 0.9 % SOLN, Flush right lower quadrant drain with 5-10 mL normal saline every day., Disp: 200 mL, Rfl: 0   traMADol (ULTRAM) 50 MG tablet, Take 50 mg by mouth every 6 (six) hours as needed for moderate pain or severe pain., Disp: , Rfl:   Review of Systems: + Fever/chills, abdominal pain, urinary frequency, dizziness.  Voice changes. Denies appetite changes, unexplained weight changes. Denies hearing loss, neck lumps or masses, mouth sores, ringing in ears. Denies cough or wheezing.  Denies shortness of breath. Denies chest pain or palpitations. Denies leg swelling. Denies abdominal distention, blood in stools, constipation, diarrhea, nausea, vomiting, or early satiety. Denies  pain with intercourse, dysuria, hematuria or incontinence. Denies hot flashes, pelvic pain, vaginal bleeding or vaginal discharge.   Denies joint pain, back pain or muscle pain/cramps. Denies itching, rash, or wounds. Denies headaches, numbness or seizures. Denies swollen lymph nodes or glands, denies easy bruising or bleeding. Denies anxiety, depression, confusion, or decreased concentration.  Physical Exam: BP 125/81 (BP Location: Right Arm, Patient Position: Sitting)   Pulse (!) 112   Temp 99.3 F (37.4 C) (Oral)   Resp 19   Ht _0  (1.575 m)   Wt 114 lb (51.7 kg)   LMP 12/28/2021   SpO2 99%   BMI 20.85 kg/m  General: Alert, oriented, no acute distress. HEENT: Normocephalic, atraumatic, sclera anicteric. Chest: Clear to auscultation bilaterally.  No wheezes or rhonchi. Cardiovascular: Mildly tachycardic, regular rhythm, no murmurs. Abdomen: soft, nontender, mildly distended.  Normoactive bowel sounds.  No masses or hepatosplenomegaly appreciated.  Drain in place, minimal serous fluid within the drain tubing and drain itself.  Drain stripped. Extremities: Grossly normal range of motion.  Warm, well perfused.  No edema bilaterally.  Laboratory & Radiologic Studies:    Latest Ref Rng & Units 01/27/2022   12:02 PM 01/20/2022    4:42 AM 01/19/2022    5:09 AM  CBC  WBC 4.0 - 10.5 K/uL 14.5  13.0  14.7   Hemoglobin 12.0 - 15.0 g/dL 10.9  9.2  9.4   Hematocrit 36.0 - 46.0 % 34.3  30.2  30.6   Platelets 150 - 400 K/uL 385  620  574       Latest Ref Rng & Units 01/27/2022   12:02 PM 01/20/2022    4:42 AM 01/19/2022    5:09 AM  BMP  Glucose 70 - 99 mg/dL 108  119  117   BUN 6 - 20 mg/dL _1 Creatinine 0.44 - 1.00 mg/dL 0.53  0.55  0.57   Sodium 135 - 145 mmol/L 135  134  137   Potassium 3.5 - 5.1 mmol/L 3.3  3.5  3.7   Chloride 98 - 111 mmol/L 102  97  101   CO2 22 - 32 mmol/L _2 Calcium  8.9 - 10.3 mg/dL 9.9  9.4  9.6     Assessment & Plan: Marie Hill is a  41 y.o. woman with known history of endometriosis who developed TOA after outpatient REI procedure (transvaginal endometrioma drainage) that failed outpatient therapy, most recently admitted and restarted on IV antibiotics which she has continued outpatient, now presenting with a day and a half of worsening symptoms.  Discussed with the patient her increasing white count.  While not significantly higher than at the time of discharge, given her continued Zosyn during this period and her worsening symptoms, I recommend admission with planning for surgery over the next few days.  We had tentatively plan for surgery towards the end of the month after a prolonged course of IV antibiotics.  I think it is unlikely, but she likely needs to get tested for COVID, other respiratory illnesses.  Given ongoing infection, the patient could become quite sick after definitive surgery.  I had initially planned to have the patient admitted to [facility] with surgery early next week.  Unfortunately, her insurance is out of network there.  Given this, I spoke with our OR and have her posted for the afternoon on Monday.  We talked about admitting and the emergency department has multiple people waiting for a bed and there are multiple surgical admits waiting for a bed.  Given this, we will plan to get a CT of the abdomen and pelvis at [facility].  We found out that the patient would not be able to be admitted to [facility], we advised her to present to the emergency department at [facility].  Hopefully she can be admitted there and transferred to [facility].  We briefly discussed plan for surgery on Monday.  While the patient still wishes to feel better, she is interested in keeping her uterus.  She is understanding of the uterus needs to be removed at the time of surgery.  If frank infection encountered, it may decrease risk of complications postoperatively from a healing standpoint to defer hysterectomy at this time.  38 minutes of total  time was spent for this patient encounter, including preparation, face-to-face counseling with the patient and coordination of care, and documentation of the encounter.  Jeral Pinch, MD  Division of Gynecologic Oncology  Department of Obstetrics and Gynecology  [facility]

## 2022-01-27 NOTE — ED Provider Notes (Signed)
[facility] EMERGENCY DEPT Provider Note   CSN: [id number] Arrival date & time: 01/27/22  1619     History  Chief Complaint  Patient presents with   Fever    Marie Hill is a 41 y.o. female. With past medical history of hyperlipidemia, endometriosis, TOA who presents to the emergency department for fever.  States over the last few days she has had fever.  She states that she went to see Dr. Berline Lopes, her gynecologist this morning and was told that her white blood cell count was high.  She states that Dr. Berline Lopes scheduled her for a CT scan and then instructed her to present to the emergency department for IV antibiotics and admission.  She states that she was supposed to have surgery further in the future which has been scheduled now for Monday.  She does note that over the past few days she has had a fever, notably 101 last night.  She states that today she had nausea without vomiting and is having liquid bowel movements.  Denies having severe abdominal pain, but states that she has abdominal pain "like she has to have a bowel movement."  On chart review the patient was seen today by Dr. Berline Lopes, Gyn Onc. Appears she began having abdominal pain back in November after stopping her [place]. About two weeks after this she developed pain and fever and presented to the emergency department. Had leukocytosis of 25. Found to have [place]. She was treated with IV antibiotics and seen in follow-up on 11/29. She continued to have abscess. On 12/1 she was seen again by Dr. Berline Lopes and sent to ED for admission after being found to be septic again. She was admitted from 12/1-12/8 for IV antibiotics. She had IR drain placed. She was discharged on prolonged course of IV Zosyn with plan for surgery. She was then seen today in clinic with report of fever since yesterday up to 101. Appears she is scheduled on 01/30/22 for open salpingo oophorectomy and possible hysterectomy with Dr. Berline Lopes, GYN.     Fever Associated symptoms: diarrhea and nausea   Associated symptoms: no dysuria and no vomiting        Home Medications Prior to Admission medications   Medication Sig Start Date End Date Taking? Authorizing Provider  empagliflozin (JARDIANCE) 25 MG TABS tablet Take 25 mg by mouth daily.    [provider]  ibuprofen (ADVIL) 200 MG tablet Take 400 mg by mouth every 6 (six) hours as needed for moderate pain.    [provider]  metFORMIN (GLUCOPHAGE-XR) 500 MG 24 hr tablet Take 1,000 mg by mouth 2 (two) times daily with a meal.    [provider]  piperacillin-tazobactam (ZOSYN) IVPB Inject 13.5 g into the vein daily for 13 days. Indication:  tubo-ovarian abscess First Dose: No Last Day of Therapy:  02/02/22 Labs - Once weekly:  CBC/D and BMP, Labs - Every other week:  ESR and CRP Method of administration: Administer Zosyn 13.5g IV daily as a continuous infusion.   Elastomeric (Continuous infusion) Method of administration may be changed at the discretion of home infusion pharmacist based upon assessment of the patient and/or caregiver's ability to self-administer the medication ordered. 01/20/22 02/02/22  Lafonda Mosses, MD  rosuvastatin (CRESTOR) 40 MG tablet Take 40 mg by mouth daily.    [provider]  sodium chloride flush (NS) 0.9 % SOLN Flush right lower quadrant drain with 5-10 mL normal saline every day. 01/19/22   Han, Aimee H,  PA-C  traMADol (ULTRAM) 50 MG tablet Take 50 mg by mouth every 6 (six) hours as needed for moderate pain or severe pain. 01/10/22   [provider]      Allergies    Patient has no known allergies.    Review of Systems   Review of Systems  Constitutional:  Positive for fever.  Gastrointestinal:  Positive for abdominal pain, diarrhea and nausea. Negative for vomiting.  Genitourinary:  Negative for dysuria and vaginal discharge.  All other systems reviewed and are negative.   Physical  Exam Updated Vital Signs BP 118/71   Pulse (!) 106   Temp 98.3 F (36.8 C) (Oral)   Resp 14   LMP 12/28/2021   SpO2 99%  Physical Exam Vitals and nursing note reviewed.  Constitutional:      Appearance: Normal appearance.  HENT:     Head: Normocephalic.     Mouth/Throat:     Mouth: Mucous membranes are dry.     Pharynx: Oropharynx is clear.  Eyes:     General: No scleral icterus.    Extraocular Movements: Extraocular movements intact.     Pupils: Pupils are equal, round, and reactive to light.  Cardiovascular:     Rate and Rhythm: Regular rhythm. Tachycardia present.     Pulses: Normal pulses.     Heart sounds: No murmur heard. Pulmonary:     Effort: Pulmonary effort is normal. No respiratory distress.     Breath sounds: Normal breath sounds. No wheezing.  Abdominal:     General: Bowel sounds are normal. There is no distension.     Palpations: Abdomen is soft. There is no mass.     Tenderness: There is abdominal tenderness. There is no guarding.     Comments: IR drain in the RLQ. Serosanguinous discharge   Musculoskeletal:        General: Normal range of motion.     Cervical back: Neck supple.  Skin:    General: Skin is warm and dry.     Capillary Refill: Capillary refill takes less than 2 seconds.     Coloration: Skin is not jaundiced.     Findings: No erythema or rash.  Neurological:     General: No focal deficit present.     Mental Status: She is alert and oriented to person, place, and time. Mental status is at baseline.  Psychiatric:        Mood and Affect: Mood normal.        Behavior: Behavior normal.        Thought Content: Thought content normal.        Judgment: Judgment normal.     ED Results / Procedures / Treatments   Labs (all labs ordered are listed, but only abnormal results are displayed) Labs Reviewed  CBC WITH DIFFERENTIAL/PLATELET - Abnormal; Notable for the following components:      Result Value   WBC 16.8 (*)    RBC 3.86 (*)     Hemoglobin 9.6 (*)    HCT 30.3 (*)    MCV 78.5 (*)    MCH 24.9 (*)    RDW 17.2 (*)    Neutro Abs 12.7 (*)    All other components within normal limits  COMPREHENSIVE METABOLIC PANEL - Abnormal; Notable for the following components:   Sodium 133 (*)    Potassium 2.9 (*)    CO2 19 (*)    BUN 5 (*)    Creatinine, Ser 0.42 (*)    Total  Protein 8.4 (*)    Alkaline Phosphatase 136 (*)    All other components within normal limits  URINALYSIS, ROUTINE W REFLEX MICROSCOPIC - Abnormal; Notable for the following components:   Color, Urine COLORLESS (*)    Specific Gravity, Urine 1.037 (*)    Glucose, UA >1,000 (*)    Ketones, ur 15 (*)    Protein, ur TRACE (*)    Bacteria, UA RARE (*)    All other components within normal limits  RESP PANEL BY RT-PCR (RSV, FLU A&B, COVID)  RVPGX2  CULTURE, BLOOD (ROUTINE X 2)  CULTURE, BLOOD (ROUTINE X 2)  LACTIC ACID, PLASMA  PREGNANCY, URINE  LIPASE, BLOOD  MAGNESIUM    EKG None  Radiology CT Abdomen Pelvis W Contrast  Result Date: 01/27/2022 CLINICAL DATA:  Abdominal pain, adnexal abscess EXAM: CT ABDOMEN AND PELVIS WITH CONTRAST TECHNIQUE: Multidetector CT imaging of the abdomen and pelvis was performed using the standard protocol following bolus administration of intravenous contrast. RADIATION DOSE REDUCTION: This exam was performed according to the departmental dose-optimization program which includes automated exposure control, adjustment of the mA and/or kV according to patient size and/or use of iterative reconstruction technique. 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 Contracted gallbladder. Otherwise unremarkable. Pancreas: Unremarkable Spleen: Unremarkable Adrenals/Urinary Tract: Adrenal glands unremarkable. Mild hydronephrosis and hydroureter on the right extending down to the level  where the right ureter passes adjacent to the large right adnexal abscess. The ureter is not well appreciated distal to this level. Urinary bladder unremarkable. Stomach/Bowel: Prominent stool throughout the colon favors constipation. The sigmoid close colon is very closely associated with or adherent to the right adnexal multilocular mass. Communication between the sigmoid colon in the mass is not disc proved. There is wall thickening in the distal sigmoid colon and rectum indicating inflammation which may be secondary but is technically nonspecific. Adjacent to the mass, the appendix is mildly dilated, with some internal fluid, and measuring up to 1 cm in diameter on image 44 series 7. The appendiceal wall in this region of dilatation does not appear thickened, but it does have internal fluid the appendiceal tip is indistinct. Vascular/Lymphatic: Scattered retroperitoneal lymph nodes are present including an aortocaval node measuring 0.8 cm in diameter. These are likely reactive. Similarly there are likely reactive pelvic lymph nodes. No substantial atheromatous calcification observed. Reproductive: There is a pigtail catheter within a complex 8.6 by 6.7 by 6.2 cm (volume = 190 cm^3) right adnexal collection with thick enhancing margins and multiple loculations of gas. This may well represent a multilocular abscess. The process is improved from previous, previously measuring 9.1 by 7.0 by 7.5 cm (volume = 250 cm^3). Surrounding secondary inflammatory findings noted including edema stranding in the perirectal space. Other: No supplemental non-categorized findings. Musculoskeletal: Unremarkable IMPRESSION: 1. The multilocular right adnexal mass has a volume of 190 cubic cm, previously 250 cubic cm. The pigtail catheter appears to be well positioned within this collection. 2. There is wall thickening in the distal sigmoid colon and rectum indicating inflammation which may be secondary but is technically nonspecific.  Connectivity between the sigmoid colon the abscess is not readily excluded. 3. The appendix is mildly dilated, measuring up to 1 cm in diameter, with some internal fluid. The appendiceal wall in this region does not appear thickened, but the appendiceal tip is indistinct. This most likely reflects reactive  inflammation of the appendix rather than acute appendicitis. 4. Mild right hydronephrosis and hydroureter extending down to the level where the right ureter passes adjacent to the right adnexal mass. 5. Prominent stool throughout the colon favors constipation. 6. Scattered retroperitoneal and pelvic lymph nodes are likely reactive. Electronically Signed   By: Van Clines M.D.   On: 01/27/2022 18:27   DG Chest 2 View  Result Date: 01/27/2022 CLINICAL DATA:  Weakness EXAM: CHEST - 2 VIEW COMPARISON:  01/20/2022 FINDINGS: The heart size and mediastinal contours are within normal limits. Both lungs are clear. The visualized skeletal structures are unremarkable. Probable nipple shadows over the lower chest bilaterally. Left upper extremity central venous catheter tip over the SVC. IMPRESSION: No active cardiopulmonary disease. Electronically Signed   By: Donavan Foil M.D.   On: 01/27/2022 17:49    Procedures .Critical Care  Performed by: Mickie Hillier, PA-C Authorized by: Mickie Hillier, PA-C   Critical care provider statement:    Critical care time (minutes):  35   Critical care was necessary to treat or prevent imminent or life-threatening deterioration of the following conditions:  Sepsis   Critical care was time spent personally by me on the following activities:  Development of treatment plan with patient or surrogate, discussions with consultants, discussions with primary provider, evaluation of patient's response to treatment, examination of patient, interpretation of cardiac output measurements, obtaining history from patient or surrogate, review of old charts, re-evaluation of  patient's condition, pulse oximetry, ordering and review of radiographic studies, ordering and review of laboratory studies and ordering and performing treatments and interventions   I assumed direction of critical care for this patient from another provider in my specialty: no     Care discussed with: admitting provider      Medications Ordered in ED Medications  vancomycin (VANCOCIN) IVPB 1000 mg/200 mL premix (1,000 mg Intravenous New Bag/Given 01/27/22 2028)  ceFEPIme (MAXIPIME) 2 g in sodium chloride 0.9 % 100 mL IVPB (2 g Intravenous New Bag/Given 01/27/22 2032)  sodium chloride 0.9 % bolus 1,000 mL (1,000 mLs Intravenous New Bag/Given 01/27/22 1912)  potassium chloride 10 mEq in 100 mL IVPB (10 mEq Intravenous New Bag/Given 01/27/22 2026)  lactated ringers bolus 500 mL (500 mLs Intravenous New Bag/Given 01/27/22 2025)    ED Course/ Medical Decision Making/ A&P                           Medical Decision Making Amount and/or Complexity of Data Reviewed Labs: ordered. Radiology: ordered.  Risk Prescription drug management. Decision regarding hospitalization.  Initial Impression and Ddx 41 year old female presents with sepsis related to [facility]. On physical exam she is ill-appearing.  She does not appear to be overtly toxic.  She is hemodynamically stable.  She is mildly tachycardic and has low-grade fever.  Her abdomen is mildly tender in the right lower quadrant and she has a IR drain placed here with serosanguineous fluid.  Abdomen is not peritonitic or distended. Patient PMH that increases complexity of ED encounter: Hyperlipidemia, TOA  Interpretation of Diagnostics I independent reviewed and interpreted the labs as followed: White blood cell count 16.8, potassium 2.9, UA without UTI, COVID, flu, RSV negative, lactic negative, lipase negative, not pregnant  - I independently visualized the following imaging with scope of interpretation limited to determining acute life  threatening conditions related to emergency care: CT scan today at 15:45 ordered by Dr. Berline Lopes, which revealed 1. The multilocular  right adnexal mass has a volume of 190 cubic cm, previously 250 cubic cm. The pigtail catheter appears to be well positioned within this collection. 2. There is wall thickening in the distal sigmoid colon and rectum indicating inflammation which may be secondary but is technically nonspecific. Connectivity between the sigmoid colon the abscess is not readily excluded. 3. The appendix is mildly dilated, measuring up to 1 cm in diameter, with some internal fluid. The appendiceal wall in this region does not appear thickened, but the appendiceal tip is indistinct. This most likely reflects reactive inflammation of the appendix rather than acute appendicitis. 4. Mild right hydronephrosis and hydroureter extending down to the level where the right ureter passes adjacent to the right adnexal mass. 5. Prominent stool throughout the colon favors constipation. 6. Scattered retroperitoneal and pelvic lymph nodes are likely reactive.   Patient Reassessment and Ultimate Disposition/Management Patient with sepsis.  She does have a leukocytosis, fever, tachycardia here.  Known source to be of her right lower quadrant TOA.  Based on her CT there appears to be some local inflammation to the colon, appendix without appendicitis.  She also appears to have some mild hydronephrosis which may be from mass effect of the adnexa.  Blood cultures were obtained.  She was given fluid resuscitation and 46m/kg as well as started on Vancocin and cefepime given that she has failed IV Zosyn.  We also replaced potassium while she was here.  [facility] and is scheduled for surgery on Monday.  Patient is agreeable to this plan.  [facility]  No other emergent findings or  concerns at this time.  Patient management required discussion with the following services or consulting groups:  OB/GYN  Complexity of Problems Addressed Acute illness or injury that poses threat of life of bodily function  Additional Data Reviewed and Analyzed Further history obtained from: Further history from spouse/family member, Past medical history and medications listed in the EMR, Prior ED visit notes, Recent discharge summary, Recent Consult notes, Care Everywhere, and Prior labs/imaging results  Patient Encounter Risk Assessment Prescriptions, SDOH impact on management, Consideration of hospitalization, and Major procedures  Final Clinical Impression(s) / ED Diagnoses Final diagnoses:  Sepsis, due to unspecified organism, unspecified whether acute organ dysfunction present ([facility]    Rx / DC Orders ED Discharge Orders     None         AMickie Hillier PA-C 01/27/22 2225    REzequiel Essex MD 01/28/22 [phone]

## 2022-01-27 NOTE — Progress Notes (Signed)
Pharmacy Antibiotic Note  Marie Hill is a 41 y.o. female admitted on 01/27/2022 with  abd abscess .  Pharmacy has been consulted for vanc/cefepime dosing.  Pt with ongoing issue with TOA and infection. Presented with several days of fever. Empiric vanc/cefepime ordered again. She has been on abx prior to admission.   Scr 0.42  Plan: Vanc 1g IV q24>>AUC 422, scr 0.8 Cefepime 2g IV x1 then 2g IV q8     Temp (24hrs), Avg:99.7 F (37.6 C), Min:99.3 F (37.4 C), Max:100 F (37.8 C)  Recent Labs  Lab 01/27/22 1202 01/27/22 1919  WBC 14.5* 16.8*  CREATININE 0.53 0.42*  LATICACIDVEN  --  0.7    Estimated Creatinine Clearance: 72.5 mL/min (A) (by C-G formula based on SCr of 0.42 mg/dL (L)).    No Known Allergies  Antimicrobials this admission: 12/15 vanc>> 12/15 cefepime>>  Dose adjustments this admission:   Microbiology results: 12/15 blood>>   Onnie Boer, PharmD, [place], AAHIVP, CPP Infectious Disease Pharmacist 01/27/2022 8:05 PM

## 2022-01-27 NOTE — Progress Notes (Addendum)
For Anesthesia: PCP - Dr. Leretha Pol [facility]  Cardiologist - N/A  Chest x-ray - 12/26/21 (01/20/22 checking PICC LINE Placement) EKG - 12/26/21 in [facility] Stress Test - N/A ECHO - N/A Cardiac Cath - N/A Pacemaker/ICD device last checked: N/A Pacemaker orders received: N/A Device Rep notified: N/A  Spinal Cord Stimulator: N/A  Sleep Study - N/A CPAP - N/A  Fasting Blood Sugar - N/A Checks Blood Sugar ___N/A__ times a day Date and result of last Hgb A1c-N/A  Last dose of GLP1 agonist- N/A GLP1 instructions: N/A  Last dose of SGLT-2 inhibitors- N/A SGLT-2 instructions:N/A  Blood Thinner Instructions:N/A Aspirin Instructions:N/A Last Dose:N/A  Activity level:   Able to exercise without chest pain and/or shortness of breath    Anesthesia review: N/A  Patient denies shortness of breath, fever, cough and chest pain at PAT appointment   Patient verbalized understanding of instructions reviewed via telephone.

## 2022-01-28 DIAGNOSIS — I10 Essential (primary) hypertension: Secondary | ICD-10-CM | POA: Diagnosis present

## 2022-01-28 DIAGNOSIS — Z1152 Encounter for screening for COVID-19: Secondary | ICD-10-CM | POA: Diagnosis not present

## 2022-01-28 DIAGNOSIS — N736 Female pelvic peritoneal adhesions (postinfective): Secondary | ICD-10-CM | POA: Diagnosis present

## 2022-01-28 DIAGNOSIS — N809 Endometriosis, unspecified: Secondary | ICD-10-CM | POA: Diagnosis not present

## 2022-01-28 DIAGNOSIS — D75838 Other thrombocytosis: Secondary | ICD-10-CM | POA: Diagnosis not present

## 2022-01-28 DIAGNOSIS — N739 Female pelvic inflammatory disease, unspecified: Secondary | ICD-10-CM | POA: Diagnosis not present

## 2022-01-28 DIAGNOSIS — E119 Type 2 diabetes mellitus without complications: Secondary | ICD-10-CM | POA: Diagnosis not present

## 2022-01-28 DIAGNOSIS — K56600 Partial intestinal obstruction, unspecified as to cause: Secondary | ICD-10-CM | POA: Diagnosis not present

## 2022-01-28 DIAGNOSIS — K682 Retroperitoneal fibrosis: Secondary | ICD-10-CM | POA: Diagnosis present

## 2022-01-28 DIAGNOSIS — Z79899 Other long term (current) drug therapy: Secondary | ICD-10-CM | POA: Diagnosis not present

## 2022-01-28 DIAGNOSIS — E876 Hypokalemia: Secondary | ICD-10-CM | POA: Diagnosis present

## 2022-01-28 DIAGNOSIS — Z83438 Family history of other disorder of lipoprotein metabolism and other lipidemia: Secondary | ICD-10-CM | POA: Diagnosis not present

## 2022-01-28 DIAGNOSIS — Z7984 Long term (current) use of oral hypoglycemic drugs: Secondary | ICD-10-CM | POA: Diagnosis not present

## 2022-01-28 DIAGNOSIS — E860 Dehydration: Secondary | ICD-10-CM | POA: Diagnosis present

## 2022-01-28 DIAGNOSIS — T368X5A Adverse effect of other systemic antibiotics, initial encounter: Secondary | ICD-10-CM | POA: Diagnosis not present

## 2022-01-28 DIAGNOSIS — N7093 Salpingitis and oophoritis, unspecified: Secondary | ICD-10-CM | POA: Diagnosis present

## 2022-01-28 DIAGNOSIS — E785 Hyperlipidemia, unspecified: Secondary | ICD-10-CM | POA: Diagnosis present

## 2022-01-28 DIAGNOSIS — N179 Acute kidney failure, unspecified: Secondary | ICD-10-CM | POA: Diagnosis not present

## 2022-01-28 DIAGNOSIS — D649 Anemia, unspecified: Secondary | ICD-10-CM | POA: Diagnosis not present

## 2022-01-28 DIAGNOSIS — N1330 Unspecified hydronephrosis: Secondary | ICD-10-CM | POA: Diagnosis present

## 2022-01-28 DIAGNOSIS — Z8249 Family history of ischemic heart disease and other diseases of the circulatory system: Secondary | ICD-10-CM | POA: Diagnosis not present

## 2022-01-28 DIAGNOSIS — E1165 Type 2 diabetes mellitus with hyperglycemia: Secondary | ICD-10-CM | POA: Diagnosis present

## 2022-01-28 DIAGNOSIS — K920 Hematemesis: Secondary | ICD-10-CM | POA: Diagnosis not present

## 2022-01-28 LAB — CBC
HCT: 29 % — ABNORMAL LOW (ref 36.0–46.0)
Hemoglobin: 8.9 g/dL — ABNORMAL LOW (ref 12.0–15.0)
MCH: 24.9 pg — ABNORMAL LOW (ref 26.0–34.0)
MCHC: 30.7 g/dL (ref 30.0–36.0)
MCV: 81.2 fL (ref 80.0–100.0)
Platelets: 328 10*3/uL (ref 150–400)
RBC: 3.57 MIL/uL — ABNORMAL LOW (ref 3.87–5.11)
RDW: 17.4 % — ABNORMAL HIGH (ref 11.5–15.5)
WBC: 9.3 10*3/uL (ref 4.0–10.5)
nRBC: 0 % (ref 0.0–0.2)

## 2022-01-28 LAB — BASIC METABOLIC PANEL
Anion gap: 6 (ref 5–15)
BUN: 6 mg/dL (ref 6–20)
CO2: 23 mmol/L (ref 22–32)
Calcium: 8.5 mg/dL — ABNORMAL LOW (ref 8.9–10.3)
Chloride: 109 mmol/L (ref 98–111)
Creatinine, Ser: 0.46 mg/dL (ref 0.44–1.00)
GFR, Estimated: >60 mL/min (ref >60)
Glucose, Bld: 164 mg/dL — ABNORMAL HIGH (ref 70–99)
Potassium: 3.6 mmol/L (ref 3.5–5.1)
Sodium: 138 mmol/L (ref 135–145)

## 2022-01-28 LAB — GLUCOSE, CAPILLARY
Glucose-Capillary: 194 mg/dL — ABNORMAL HIGH (ref 70–99)
Glucose-Capillary: 122 mg/dL — ABNORMAL HIGH (ref 70–99)

## 2022-01-28 LAB — PHOSPHORUS: Phosphorus: 2.6 mg/dL (ref 2.5–4.6)

## 2022-01-28 LAB — MAGNESIUM: Magnesium: 2.2 mg/dL (ref 1.7–2.4)

## 2022-01-28 MED ORDER — INSULIN ASPART 100 UNIT/ML IJ SOLN
0.0000 [IU] | Freq: Every day | INTRAMUSCULAR | Status: DC
  Administered 2022-01-30 – 2022-01-31 (×2): 2 [IU] via SUBCUTANEOUS

## 2022-01-28 MED ORDER — CHLORHEXIDINE GLUCONATE CLOTH 2 % EX PADS
6.0000 | MEDICATED_PAD | Freq: Every day | CUTANEOUS | Status: DC
  Administered 2022-01-29 – 2022-02-21 (×25): 6 via TOPICAL

## 2022-01-28 MED ORDER — ACETAMINOPHEN 325 MG PO TABS: 650.0000 mg | ORAL_TABLET | Freq: Four times a day (QID) | ORAL | Status: DC | PRN

## 2022-01-28 MED ORDER — INSULIN ASPART 100 UNIT/ML IJ SOLN: 0.0000 [IU] | Freq: Three times a day (TID) | INTRAMUSCULAR | Status: DC

## 2022-01-28 MED ORDER — ENOXAPARIN SODIUM 40 MG/0.4ML IJ SOSY
40.0000 mg | PREFILLED_SYRINGE | INTRAMUSCULAR | Status: AC
  Administered 2022-01-29: 40 mg via SUBCUTANEOUS
  Filled 2022-01-28: qty 0.4

## 2022-01-28 MED ORDER — SODIUM CHLORIDE 0.9 % IV SOLN: INTRAVENOUS | Status: DC | PRN

## 2022-01-28 MED ORDER — INSULIN ASPART 100 UNIT/ML IJ SOLN
0.0000 [IU] | Freq: Three times a day (TID) | INTRAMUSCULAR | Status: DC
  Administered 2022-01-28: 2 [IU] via SUBCUTANEOUS
  Administered 2022-01-30: 1 [IU] via SUBCUTANEOUS
  Administered 2022-01-31: 2 [IU] via SUBCUTANEOUS
  Administered 2022-01-31 (×2): 1 [IU] via SUBCUTANEOUS
  Administered 2022-02-01 (×2): 2 [IU] via SUBCUTANEOUS
  Administered 2022-02-02: 1 [IU] via SUBCUTANEOUS
  Administered 2022-02-02 – 2022-02-03 (×3): 2 [IU] via SUBCUTANEOUS

## 2022-01-28 NOTE — ED Notes (Signed)
Pt ambulated to the bathroom and back independently, without need of assistance, without incident.

## 2022-01-28 NOTE — H&P (Addendum)
GYNECOLOGIC ONCOLOGY ADMISSION H&P  Date of Service: 01/27/2022  Requesting Service: Emergency Department Requesting Provider: Theodis Blaze, PA-C Consulting Provider: Bernadene Bell, MD   HISTORY OF PRESENT ILLNESS: Marie Hill is a 41 y.o. woman who is seen in consultation at the request of [facility] for evaluation of persistent TOA and fever.  In summary of patient's course, this patient has been following with Dr. Kerin Perna for infertility. She underwent a transvaginal ultrasound guided ovarian endometrioma aspiration with doxycycline for prophylaxis. When she was seen back in early November she noted abdominal pain and ultrasound noted a right endometrioma and left anechoic cyst. She was started on aygestin and solara. Pt subsequently presented to the emergency department for pain and fevers and was noted to have a leukocytosis to 25, and was febrile and tachycardic. CT showed a complex multi-septated cystic right adnexal mass measuring 9.2cm (12/26/21) concerning for a TOA. Patient was admitted and started on IV cefepime, flagyl, vanc. She was discharge home on Augmentin and doxycycline for 10 days. At follow-up on 01/11/22 an ultrasound showed a right tubo-ovarian complex measuring 10.6cm. In this setting, she was sent for consultation with Gyn Oncology with Dr. Berline Lopes. She was sent to the emergency department given pain, tachycardia and fevers. During an admission from 12/1-12/8, a drain was placed with culture returning with PREVOTELLA BIVIA. She was ultimately discharged home on a prolonged course of IV zosyn with plan for interval surgery.   Most recently, patient presented in follow-up to Dr. Berline Lopes on 01/27/22 at which time the patient noted that she had worsened at home in the past day with fevers up to 101 and feeling achy and weak. She also noted some nausea and decreased appetite. She had been flushing her drain BID with virtually no output since discharge. Given  increased WBC, worsening symptoms despite IV antibiotics, recommendation was made for admission and definitive surgical management. Plan had initially been for admission at [facility], however, her insurance was out of network. As a result she was sent to the emergency room for CT scan, continuation of antibiotics and admission.  Interval History: Since at [facility] Emergency Department, pt reports that she is feeling improved. She reports that her temperature has come down and denies any new fevers. She is eating and drinking well with her appetite returning. She is having regular bowel movements. Pt reports that she mostly feels some ache in her abdomen but no pain and denies needing any pain medications. She feels much better than she did the past few days. She does note that her drain only has a few cc's of fluid in it that she believes is just her flush fluid with no additional output.    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   no meds 1997, treated in June 2016 x 1 month- states neg chest x ray June 2016    PAST SURGICAL HISTORY: Past Surgical History:  Procedure Laterality Date   LAPAROSCOPIC UNILATERAL SALPINGO OOPHERECTOMY N/A 09/29/2014   Procedure: LAPAROSCOPIC LEFT SALPINGO OOPHORECTOMY ADHESIOLYSIS ;  Surgeon: Everitt Amber, MD;  Location: [facility];  Service: Gynecology;  Laterality: N/A;   ROBOTIC ASSISTED LAPAROSCOPIC OVARIAN  CYSTECTOMY N/A 09/29/2014   Procedure: SI ROBOTIC ASSISTED LAPAROSCOPIC OVARIAN CYSTECTOMY;  Surgeon: Everitt Amber, MD;  Location: [facility];  Service: Gynecology;  Laterality: N/A;   TYMPANOPLASTY Left 08/19/2019   Procedure: LEFT TYMPANOPLASTY;  Surgeon: Rozetta Nunnery, MD;  Location: [facility];  Service: ENT;  Laterality: Left;    OB/GYN HISTORY: OB History  Gravida Para Term Preterm AB Living  2 0 0 0 2 0  SAB IAB Ectopic Multiple Live Births  2 0 0 0      # Outcome Date GA Lbr Len/2nd Weight Sex Delivery Anes PTL Lv  2 SAB           1 SAB             MEDICATIONS:  Current Facility-Administered Medications:    ceFEPIme (MAXIPIME) 2 g in sodium chloride 0.9 % 100 mL IVPB, 2 g, Intravenous, Q8H, Pham, Minh Q, RPH-CPP, Stopped at 01/28/22 0500   vancomycin (VANCOCIN) IVPB 1000 mg/200 mL premix, 1,000 mg, Intravenous, Q24H, Pham, Minh Q, RPH-CPP, Stopped at 01/28/22 0411  Current Outpatient Medications:    empagliflozin (JARDIANCE) 25 MG TABS tablet, Take 25 mg by mouth daily., Disp: , Rfl:    ibuprofen (ADVIL) 200 MG tablet, Take 400 mg by mouth every 6 (six) hours as needed for moderate pain., Disp: , Rfl:    metFORMIN (GLUCOPHAGE-XR) 500 MG 24 hr tablet, Take 1,000 mg by mouth 2 (two) times daily with a meal., Disp: , Rfl:    piperacillin-tazobactam (ZOSYN) IVPB, Inject 13.5 g into the vein daily for 13 days. Indication:  tubo-ovarian abscess First Dose: No Last Day of Therapy:  02/02/22 Labs - Once weekly:  CBC/D and BMP, Labs - Every other week:  ESR and CRP Method of administration: Administer Zosyn 13.5g IV daily as a continuous infusion.   Elastomeric (Continuous infusion) Method of administration may be changed at the discretion of home infusion pharmacist based upon assessment of the patient and/or caregiver's ability to self-administer the medication ordered., Disp: 13 Units, Rfl: 0   rosuvastatin (CRESTOR) 40 MG tablet, Take 40 mg by mouth daily., Disp: , Rfl:    sodium chloride flush (NS) 0.9 % SOLN, Flush right lower quadrant drain with 5-10 mL normal saline every day., Disp: 200 mL, Rfl: 0   traMADol (ULTRAM) 50 MG tablet, Take 50 mg by mouth every 6 (six) hours as needed for moderate pain or severe pain., Disp: , Rfl:    ALLERGIES: No Known Allergies  FAMILY HISTORY: Family History  Problem Relation Age of Onset   Hyperlipidemia Mother    Hypertension Mother    Hyperlipidemia Father     SOCIAL HISTORY: Social History   Socioeconomic History   Marital status: Married    Spouse name: Not on file   Number of children: 0   Years of education: Not on file   Highest education level: Not on file  Occupational History   Occupation: Metallurgist  Tobacco Use   Smoking status: Never   Smokeless tobacco: Never  Vaping Use   Vaping Use: Never used  Substance and Sexual Activity   Alcohol use: Not Currently    Alcohol/week: 0.0 standard drinks of alcohol   Drug use: No   Sexual activity: Not Currently    Partners: Male    Birth control/protection: None  Other Topics Concern   Not on file  Social History Narrative   Marital status:  Single; together x 8 years.  Children: none      Lives: with boyfriend; from [place]; moved to [place] at age 35.      Employment:  Cytogeneticist.      Tobacco: none      Alcohol: weekends      Drugs: none      Exercise:  Sporadic      Seatbelt: 100%; no texting   Social Determinants of Health   Financial Resource Strain: Not on file  Food Insecurity: No Food Insecurity (01/13/2022)   Hunger Vital Sign    Worried About Running Out of Food in the Last Year: Never true    Ran Out of Food in the Last Year: Never true  Transportation Needs: No Transportation Needs (01/13/2022)   PRAPARE - Hydrologist (Medical): No    Lack of Transportation (Non-Medical): No  Physical Activity: Not on file  Stress: Not on file  Social Connections: Not on file  Intimate Partner Violence: Not At Risk (01/13/2022)   Humiliation, Afraid, Rape, and Kick questionnaire    Fear of Current or Ex-Partner: No    Emotionally Abused: No    Physically Abused: No    Sexually Abused: No    REVIEW OF SYSTEMS: Complete  10-system review is negative except for the following: none  PHYSICAL EXAM: BP 111/74   Pulse 100   Temp 98.4 F (36.9 C) (Oral)   Resp 14   LMP 12/28/2021   SpO2 100%  General: Alert, oriented, no acute distress. HEENT: Normocephalic, atraumatic.  Sclera anicteric. Chest: Normal work of breathing Abdomen: Soft, mildly distended, nontender to palpation.  Drain in the RLQ with approximately 5cc of serosanguinous fluid. Tubing stripped with no additional output. Extremities: Grossly normal range of motion.  Warm, well perfused.  No edema bilaterally. Skin: No rashes or lesions.  LABORATORY AND RADIOLOGIC DATA: Outside medical records were reviewed to synthesize the above history, along with the history and physical obtained during the visit.  Laboratory results and imaging reports were reviewed, with pertinent results below.  I personally reviewed the images.  CBC    Component Value Date/Time   WBC 16.8 (H) 01/27/2022 1919   RBC 3.86 (L) 01/27/2022 1919   HGB 9.6 (L) 01/27/2022 1919   HGB 10.9 (L) 01/27/2022 1202   HGB 12.8 06/22/2016 0849   HCT 30.3 (L) 01/27/2022 1919   HCT 39.5 06/22/2016 0849   PLT 386 01/27/2022 1919   PLT 385 01/27/2022 1202   PLT 304 06/22/2016 0849   MCV 78.5 (L) 01/27/2022 1919   MCV 81 06/22/2016 0849   MCH 24.9 (L) 01/27/2022 1919   MCHC 31.7 01/27/2022 1919   RDW 17.2 (H) 01/27/2022 1919   RDW 14.2 06/22/2016 0849   LYMPHSABS 3.1 01/27/2022 1919   LYMPHSABS 2.5 06/22/2016 0849   MONOABS 0.9 01/27/2022 1919   EOSABS 0.0 01/27/2022 1919   EOSABS 0.2 06/22/2016 0849   BASOSABS 0.0 01/27/2022 1919   BASOSABS 0.0 06/22/2016 0849     CMP     Component Value Date/Time   NA 133 (L) 01/27/2022 1919   NA 137 01/23/2020 1144   K 2.9 (L) 01/27/2022 1919   CL 99 01/27/2022 1919   CO2 19 (L) 01/27/2022 1919   GLUCOSE 91 01/27/2022 1919   BUN 5 (L) 01/27/2022 1919   BUN 9 01/23/2020 1144   CREATININE 0.42 (L) 01/27/2022 1919   CREATININE 0.62  01/13/2022 0921   CREATININE 0.54 10/12/2014 0931  CALCIUM 9.3 01/27/2022 1919   PROT 8.4 (H) 01/27/2022 1919   PROT 8.0 01/23/2020 1144   ALBUMIN 3.9 01/27/2022 1919   ALBUMIN 4.4 01/23/2020 1144   AST 15 01/27/2022 1919   AST 70 (H) 01/13/2022 0921   ALT 14 01/27/2022 1919   ALT 97 (H) 01/13/2022 0921   ALKPHOS 136 (H) 01/27/2022 1919   BILITOT 0.8 01/27/2022 1919   BILITOT 0.7 01/13/2022 0921   GFRNONAA >60 01/27/2022 1919   GFRNONAA >60 01/13/2022 0921   GFRAA 138 01/23/2020 1144   CT Abdomen Pelvis W Contrast 01/27/2022  Narrative CLINICAL DATA:  Abdominal pain, adnexal abscess  EXAM: CT ABDOMEN AND PELVIS WITH CONTRAST  TECHNIQUE: Multidetector CT imaging of the abdomen and pelvis was performed using the standard protocol following bolus administration of intravenous contrast.  RADIATION DOSE REDUCTION: This exam was performed according to the departmental dose-optimization program which includes automated exposure control, adjustment of the mA and/or kV according to patient size and/or use of iterative reconstruction technique.  CONTRAST:  20m OMNIPAQUE IOHEXOL 300 MG/ML  SOLN  COMPARISON:  01/18/2022 CT scan  FINDINGS: Lower chest: Unremarkable  Hepatobiliary: Steatosis in segment 4b of the liver near the falciform ligament. Contracted gallbladder. Otherwise unremarkable.  Pancreas: Unremarkable  Spleen: Unremarkable  Adrenals/Urinary Tract: Adrenal glands unremarkable. Mild hydronephrosis and hydroureter on the right extending down to the level where the right ureter passes adjacent to the large right adnexal abscess. The ureter is not well appreciated distal to this level.  Urinary bladder unremarkable.  Stomach/Bowel: Prominent stool throughout the colon favors constipation. The sigmoid close colon is very closely associated with or adherent to the right adnexal multilocular mass. Communication between the sigmoid colon in the mass is not  disc proved. There is wall thickening in the distal sigmoid colon and rectum indicating inflammation which may be secondary but is technically nonspecific. Adjacent to the mass, the appendix is mildly dilated, with some internal fluid, and measuring up to 1 cm in diameter on image 44 series 7. The appendiceal wall in this region of dilatation does not appear thickened, but it does have internal fluid the appendiceal tip is indistinct.  Vascular/Lymphatic: Scattered retroperitoneal lymph nodes are present including an aortocaval node measuring 0.8 cm in diameter. These are likely reactive. Similarly there are likely reactive pelvic lymph nodes. No substantial atheromatous calcification observed.  Reproductive: There is a pigtail catheter within a complex 8.6 by 6.7 by 6.2 cm (volume = 190 cm^3) right adnexal collection with thick enhancing margins and multiple loculations of gas. This may well represent a multilocular abscess. The process is improved from previous, previously measuring 9.1 by 7.0 by 7.5 cm (volume = 250 cm^3). Surrounding secondary inflammatory findings noted including edema stranding in the perirectal space.  Other: No supplemental non-categorized findings.  Musculoskeletal: Unremarkable  IMPRESSION: 1. The multilocular right adnexal mass has a volume of 190 cubic cm, previously 250 cubic cm. The pigtail catheter appears to be well positioned within this collection. 2. There is wall thickening in the distal sigmoid colon and rectum indicating inflammation which may be secondary but is technically nonspecific. Connectivity between the sigmoid colon the abscess is not readily excluded. 3. The appendix is mildly dilated, measuring up to 1 cm in diameter, with some internal fluid. The appendiceal wall in this region does not appear thickened, but the appendiceal tip is indistinct. This most likely reflects reactive inflammation of the appendix rather than acute  appendicitis. 4. Mild right hydronephrosis and hydroureter extending  down to the level where the right ureter passes adjacent to the right adnexal mass. 5. Prominent stool throughout the colon favors constipation. 6. Scattered retroperitoneal and pelvic lymph nodes are likely reactive.   Electronically Signed By: Van Clines M.D. On: 01/27/2022 18:27   ASSESSMENT AND PLAN: Marie Hill is a 41 y.o. woman with a known history of endometriosis who developed a TOA after outpatient REI procedure who has failed outpatient management of TOA with worsening symptoms at home while on IV zosyn.  WBC increased from 13.0 at discharge to 16.8 yesterday evening. Patient was also experiencing fevers, abdominal pain, and decreased appetite at home.  Repeat CT scan yesterday in the ED showed persistent 8.6cm complex right adnexal collection, slightly decreased from 9.1, with drain in proper position. This mass is surrounded by inflammatory changes and closely associated with the sigmoid colon.   Since stay in the ED with initiation of IV cefepime/vanc, patient feels subjectively improved. She has had resolution of fevers and tachycardia.  Plan: TOA: - Recommend admission and continued IV antibiotic treatment. Will continue IV vanc/cefepime given subjective improvement and resolution of tachycardia and fevers. - Plan daily labs with CBC, Chem10. - Blood cultures collected and pending. Currently no growth x12 hours.  - Continue drain flush BID. - Pain meds PRN - Plan for definitive surgical intervention on Monday.  Diabetes: - Given plan for upcoming surgery will hold home metformin and jardiance. - qACHS blood sugar checks and sliding scale insulin ordered  VTE prophylaxis: - 16m lovenox daily (will hold on day of surgery and can use heparin)   The above assessment and plan was communicated to the patient's primary team.

## 2022-01-29 LAB — BASIC METABOLIC PANEL
Anion gap: 6 (ref 5–15)
BUN: 7 mg/dL (ref 6–20)
CO2: 22 mmol/L (ref 22–32)
Calcium: 8.8 mg/dL — ABNORMAL LOW (ref 8.9–10.3)
Chloride: 111 mmol/L (ref 98–111)
Creatinine, Ser: <0.3 mg/dL — ABNORMAL LOW (ref 0.44–1.00)
Glucose, Bld: 124 mg/dL — ABNORMAL HIGH (ref 70–99)
Potassium: 3 mmol/L — ABNORMAL LOW (ref 3.5–5.1)
Sodium: 139 mmol/L (ref 135–145)

## 2022-01-29 LAB — CBC
HCT: 28.6 % — ABNORMAL LOW (ref 36.0–46.0)
Hemoglobin: 8.6 g/dL — ABNORMAL LOW (ref 12.0–15.0)
MCH: 24.4 pg — ABNORMAL LOW (ref 26.0–34.0)
MCHC: 30.1 g/dL (ref 30.0–36.0)
MCV: 81.3 fL (ref 80.0–100.0)
Platelets: 326 10*3/uL (ref 150–400)
RBC: 3.52 MIL/uL — ABNORMAL LOW (ref 3.87–5.11)
RDW: 17.3 % — ABNORMAL HIGH (ref 11.5–15.5)
WBC: 7.4 10*3/uL (ref 4.0–10.5)
nRBC: 0 % (ref 0.0–0.2)

## 2022-01-29 LAB — GLUCOSE, CAPILLARY
Glucose-Capillary: 120 mg/dL — ABNORMAL HIGH (ref 70–99)
Glucose-Capillary: 118 mg/dL — ABNORMAL HIGH (ref 70–99)
Glucose-Capillary: 92 mg/dL (ref 70–99)
Glucose-Capillary: 134 mg/dL — ABNORMAL HIGH (ref 70–99)

## 2022-01-29 LAB — MAGNESIUM: Magnesium: 2.2 mg/dL (ref 1.7–2.4)

## 2022-01-29 LAB — PHOSPHORUS: Phosphorus: 3.1 mg/dL (ref 2.5–4.6)

## 2022-01-29 MED ORDER — PIPERACILLIN-TAZOBACTAM 3.375 G IVPB
3.3750 g | Freq: Three times a day (TID) | INTRAVENOUS | Status: DC
  Administered 2022-01-29 – 2022-02-09 (×33): 3.375 g via INTRAVENOUS
  Filled 2022-01-29 (×33): qty 50

## 2022-01-29 MED ORDER — BISACODYL 5 MG PO TBEC
20.0000 mg | DELAYED_RELEASE_TABLET | Freq: Once | ORAL | Status: AC
  Administered 2022-01-29: 20 mg via ORAL
  Filled 2022-01-29: qty 4

## 2022-01-29 MED ORDER — SODIUM CHLORIDE 0.9% FLUSH
10.0000 mL | INTRAVENOUS | Status: DC | PRN
  Administered 2022-02-14: 10 mL

## 2022-01-29 MED ORDER — POLYETHYLENE GLYCOL 3350 17 GM/SCOOP PO POWD
1.0000 | Freq: Once | ORAL | Status: AC
  Administered 2022-01-29: 255 g via ORAL
  Filled 2022-01-29: qty 255

## 2022-01-29 MED ORDER — POTASSIUM CHLORIDE CRYS ER 20 MEQ PO TBCR
40.0000 meq | EXTENDED_RELEASE_TABLET | Freq: Once | ORAL | Status: AC
  Administered 2022-01-29: 40 meq via ORAL
  Filled 2022-01-29: qty 2

## 2022-01-29 MED ORDER — POLYETHYLENE GLYCOL 3350 17 G PO PACK
238.0000 g | PACK | Freq: Once | ORAL | Status: AC
  Administered 2022-01-29: 238 g via ORAL
  Filled 2022-01-29: qty 14

## 2022-01-29 NOTE — Progress Notes (Signed)
Will change from cefepime to zosyn in order to include improved anaerobic coverage preoperatively in the setting of possibly anticipated bowel surgery.

## 2022-01-29 NOTE — Progress Notes (Addendum)
Inpatient Gyn Onc Progress Note  Subjective: Patient reports that she is overall doing well. Denies fevers/chills. Eating well. Continues to have some gas and liquid with bowel movements but reports now some small hard stools. Her pain is well controlled.     Objective: Vital signs in last 24 hours: Temp:  [97.6 F (36.4 C)-98.8 F (37.1 C)] 97.8 F (36.6 C) (12/17 0825) Pulse Rate:  [74-94] 74 (12/17 0825) Resp:  [16-26] 18 (12/17 0825) BP: (96-122)/(64-81) 122/81 (12/17 0825) SpO2:  [98 %-100 %] 100 % (12/17 0825) Weight:  [110 lb 3.7 oz (50 kg)] 110 lb 3.7 oz (50 kg) (12/16 1500) Last BM Date : 01/28/22  Intake/Output from previous day: 12/16 0701 - 12/17 0700 In: 749.7 [P.O.:100; I.V.:139.7; IV Piggyback:500] Out: 20 [Drains:20]  Physical Examination: General: alert, cooperative, and no distress Resp: clear to auscultation bilaterally Cardio: regular rate and rhythm GI: soft, non-tender; bowel sounds normal; mild distension; drain in RLQ with serosanguinous output Extremities: extremities normal, atraumatic, no cyanosis or edema  Labs: WBC/Hgb/Hct/Plts:  7.4/8.6/28.6/326 (12/17 8032) BUN/Cr/glu/ALT/AST/amyl/lip:  7/<0.30/--/--/--/--/-- (12/17 1224)  Assessment: 41 y.o. with a known history of endometriosis who developed a TOA after outpatient REI procedure who had failed outpatient management of TOA with worsening symptoms at home while on IV zosyn. Now with improvement on IV vanc/cefepime. WBC has normalized. Patient has been afebrile with normal vitals. Currently without any pain.   Plan: TOA: - Continue IV vanc/cefepime - Daily labs with CBC, Chem10. - Blood cultures no growth x2 days (x2).  - Continue drain flush BID. - Pain meds PRN - Plan for definitive surgical intervention on Monday.  Preop: - Discussed possible need for bowel surgery given imaging findings. Could include an ostomy. Patient aware and understanding of plan. - T&S collected today given  anemia. Pt would accept blood if needed. - Bowel prep ordered with dulcolax and miralax - Plan CLD for today and NPO at midnight   Hypokalemia: - Repleted PO  Diabetes: - Given plan for upcoming surgery will hold home metformin and jardiance. - qACHS blood sugar checks and sliding scale insulin ordered. Currently well controlled.   VTE prophylaxis: - '40mg'$  lovenox daily (will hold on day of surgery and can use heparin)     LOS: 1 day    Marie Hill 01/29/2022, 9:17 AM

## 2022-01-30 ENCOUNTER — Inpatient Hospital Stay (HOSPITAL_COMMUNITY): Payer: Commercial Managed Care - HMO

## 2022-01-30 ENCOUNTER — Inpatient Hospital Stay (HOSPITAL_COMMUNITY): Payer: Commercial Managed Care - HMO | Admitting: Certified Registered Nurse Anesthetist

## 2022-01-30 ENCOUNTER — Encounter (HOSPITAL_COMMUNITY): Payer: Self-pay | Admitting: Gynecologic Oncology

## 2022-01-30 ENCOUNTER — Inpatient Hospital Stay (HOSPITAL_COMMUNITY)
Admission: RE | Admit: 2022-01-30 | Payer: Commercial Managed Care - HMO | Source: Home / Self Care | Admitting: Gynecologic Oncology

## 2022-01-30 ENCOUNTER — Other Ambulatory Visit: Payer: Self-pay

## 2022-01-30 ENCOUNTER — Encounter (HOSPITAL_COMMUNITY)
Admission: EM | Disposition: A | Discharge status: Home / Self Care | Payer: Self-pay | Source: Home / Self Care | Attending: Gynecologic Oncology

## 2022-01-30 ENCOUNTER — Encounter: Payer: Self-pay | Admitting: Gynecologic Oncology

## 2022-01-30 DIAGNOSIS — N739 Female pelvic inflammatory disease, unspecified: Secondary | ICD-10-CM

## 2022-01-30 DIAGNOSIS — E119 Type 2 diabetes mellitus without complications: Secondary | ICD-10-CM | POA: Diagnosis not present

## 2022-01-30 DIAGNOSIS — N83209 Unspecified ovarian cyst, unspecified side: Secondary | ICD-10-CM

## 2022-01-30 DIAGNOSIS — N809 Endometriosis, unspecified: Secondary | ICD-10-CM

## 2022-01-30 DIAGNOSIS — D72829 Elevated white blood cell count, unspecified: Secondary | ICD-10-CM

## 2022-01-30 DIAGNOSIS — Z7984 Long term (current) use of oral hypoglycemic drugs: Secondary | ICD-10-CM | POA: Diagnosis not present

## 2022-01-30 DIAGNOSIS — N7093 Salpingitis and oophoritis, unspecified: Secondary | ICD-10-CM

## 2022-01-30 DIAGNOSIS — Z9889 Other specified postprocedural states: Secondary | ICD-10-CM

## 2022-01-30 HISTORY — PX: CYSTOSCOPY W/ URETERAL STENT PLACEMENT: SHX1429

## 2022-01-30 HISTORY — PX: SALPINGOOPHORECTOMY: SHX82

## 2022-01-30 LAB — BASIC METABOLIC PANEL
Anion gap: 8 (ref 5–15)
Anion gap: 7 (ref 5–15)
BUN: 9 mg/dL (ref 6–20)
BUN: 9 mg/dL (ref 6–20)
CO2: 21 mmol/L — ABNORMAL LOW (ref 22–32)
CO2: 21 mmol/L — ABNORMAL LOW (ref 22–32)
Calcium: 9.2 mg/dL (ref 8.9–10.3)
Calcium: 8.6 mg/dL — ABNORMAL LOW (ref 8.9–10.3)
Chloride: 111 mmol/L (ref 98–111)
Chloride: 110 mmol/L (ref 98–111)
Creatinine, Ser: 0.48 mg/dL (ref 0.44–1.00)
Creatinine, Ser: 0.52 mg/dL (ref 0.44–1.00)
GFR, Estimated: >60 mL/min (ref >60)
GFR, Estimated: >60 mL/min (ref >60)
Glucose, Bld: 104 mg/dL — ABNORMAL HIGH (ref 70–99)
Glucose, Bld: 213 mg/dL — ABNORMAL HIGH (ref 70–99)
Potassium: 3.2 mmol/L — ABNORMAL LOW (ref 3.5–5.1)
Potassium: 3.6 mmol/L (ref 3.5–5.1)
Sodium: 140 mmol/L (ref 135–145)
Sodium: 138 mmol/L (ref 135–145)

## 2022-01-30 LAB — CBC
HCT: 29.8 % — ABNORMAL LOW (ref 36.0–46.0)
HCT: 36 % (ref 36.0–46.0)
Hemoglobin: 9 g/dL — ABNORMAL LOW (ref 12.0–15.0)
Hemoglobin: 11.7 g/dL — ABNORMAL LOW (ref 12.0–15.0)
MCH: 24.6 pg — ABNORMAL LOW (ref 26.0–34.0)
MCH: 27 pg (ref 26.0–34.0)
MCHC: 30.2 g/dL (ref 30.0–36.0)
MCHC: 32.5 g/dL (ref 30.0–36.0)
MCV: 81.4 fL (ref 80.0–100.0)
MCV: 82.9 fL (ref 80.0–100.0)
Platelets: 371 10*3/uL (ref 150–400)
Platelets: 314 10*3/uL (ref 150–400)
RBC: 3.66 MIL/uL — ABNORMAL LOW (ref 3.87–5.11)
RBC: 4.34 MIL/uL (ref 3.87–5.11)
RDW: 17.2 % — ABNORMAL HIGH (ref 11.5–15.5)
RDW: 16.1 % — ABNORMAL HIGH (ref 11.5–15.5)
WBC: 6.1 10*3/uL (ref 4.0–10.5)
WBC: 17.7 10*3/uL — ABNORMAL HIGH (ref 4.0–10.5)
nRBC: 0 % (ref 0.0–0.2)
nRBC: 0 % (ref 0.0–0.2)

## 2022-01-30 LAB — GLUCOSE, CAPILLARY
Glucose-Capillary: 121 mg/dL — ABNORMAL HIGH (ref 70–99)
Glucose-Capillary: 115 mg/dL — ABNORMAL HIGH (ref 70–99)
Glucose-Capillary: 196 mg/dL — ABNORMAL HIGH (ref 70–99)
Glucose-Capillary: 203 mg/dL — ABNORMAL HIGH (ref 70–99)

## 2022-01-30 LAB — POCT I-STAT EG7
Acid-base deficit: 7 mmol/L — ABNORMAL HIGH (ref 0.0–2.0)
Bicarbonate: 19.6 mmol/L — ABNORMAL LOW (ref 20.0–28.0)
Calcium, Ion: 1.27 mmol/L (ref 1.15–1.40)
HCT: 21 % — ABNORMAL LOW (ref 36.0–46.0)
Hemoglobin: 7.1 g/dL — ABNORMAL LOW (ref 12.0–15.0)
O2 Saturation: 100 %
Patient temperature: 37.2
Potassium: 3.3 mmol/L — ABNORMAL LOW (ref 3.5–5.1)
Sodium: 139 mmol/L (ref 135–145)
TCO2: 21 mmol/L — ABNORMAL LOW (ref 22–32)
pCO2, Ven: 41.9 mmHg — ABNORMAL LOW (ref 44–60)
pH, Ven: 7.28 (ref 7.25–7.43)
pO2, Ven: 192 mmHg — ABNORMAL HIGH (ref 32–45)

## 2022-01-30 LAB — PHOSPHORUS: Phosphorus: 3.6 mg/dL (ref 2.5–4.6)

## 2022-01-30 LAB — SURGICAL PCR SCREEN
MRSA, PCR: NEGATIVE
Staphylococcus aureus: NEGATIVE

## 2022-01-30 LAB — MAGNESIUM: Magnesium: 2.1 mg/dL (ref 1.7–2.4)

## 2022-01-30 LAB — PREGNANCY, URINE: Preg Test, Ur: NEGATIVE

## 2022-01-30 LAB — PREPARE RBC (CROSSMATCH)

## 2022-01-30 SURGERY — SALPINGO-OOPHORECTOMY, OPEN
Anesthesia: General | Laterality: Right

## 2022-01-30 MED ORDER — ONDANSETRON HCL 4 MG/2ML IJ SOLN
INTRAMUSCULAR | Status: AC
  Filled 2022-01-30: qty 2

## 2022-01-30 MED ORDER — ROCURONIUM BROMIDE 10 MG/ML (PF) SYRINGE
PREFILLED_SYRINGE | INTRAVENOUS | Status: AC
  Filled 2022-01-30: qty 10

## 2022-01-30 MED ORDER — KETAMINE HCL 10 MG/ML IJ SOLN
INTRAMUSCULAR | Status: DC | PRN
  Administered 2022-01-30: 30 mg via INTRAVENOUS

## 2022-01-30 MED ORDER — HYDROMORPHONE HCL 2 MG/ML IJ SOLN
INTRAMUSCULAR | Status: AC
  Filled 2022-01-30: qty 1

## 2022-01-30 MED ORDER — PROPOFOL 10 MG/ML IV BOLUS
INTRAVENOUS | Status: DC | PRN
  Administered 2022-01-30: 80 mg via INTRAVENOUS

## 2022-01-30 MED ORDER — ALBUMIN HUMAN 5 % IV SOLN
INTRAVENOUS | Status: AC
  Filled 2022-01-30: qty 250

## 2022-01-30 MED ORDER — FENTANYL CITRATE (PF) 100 MCG/2ML IJ SOLN
INTRAMUSCULAR | Status: AC
  Filled 2022-01-30: qty 2

## 2022-01-30 MED ORDER — ONDANSETRON HCL 4 MG/2ML IJ SOLN
4.0000 mg | Freq: Four times a day (QID) | INTRAMUSCULAR | Status: DC | PRN
  Administered 2022-02-02 – 2022-02-12 (×9): 4 mg via INTRAVENOUS
  Filled 2022-01-30 (×9): qty 2

## 2022-01-30 MED ORDER — BUPIVACAINE LIPOSOME 1.3 % IJ SUSP
INTRAMUSCULAR | Status: DC | PRN
  Administered 2022-01-30: 20 mL

## 2022-01-30 MED ORDER — BUPIVACAINE LIPOSOME 1.3 % IJ SUSP
INTRAMUSCULAR | Status: AC
  Filled 2022-01-30: qty 20

## 2022-01-30 MED ORDER — SURGIFLO WITH THROMBIN (HEMOSTATIC MATRIX KIT) OPTIME
TOPICAL | Status: DC | PRN
  Administered 2022-01-30: 1 via TOPICAL

## 2022-01-30 MED ORDER — ENOXAPARIN SODIUM 40 MG/0.4ML IJ SOSY
40.0000 mg | PREFILLED_SYRINGE | INTRAMUSCULAR | Status: DC
  Administered 2022-01-31 – 2022-02-04 (×5): 40 mg via SUBCUTANEOUS
  Filled 2022-01-30 (×5): qty 0.4

## 2022-01-30 MED ORDER — BUPIVACAINE HCL 0.25 % IJ SOLN
INTRAMUSCULAR | Status: DC | PRN
  Administered 2022-01-30: 40 mL

## 2022-01-30 MED ORDER — MIDAZOLAM HCL 5 MG/5ML IJ SOLN
INTRAMUSCULAR | Status: DC | PRN
  Administered 2022-01-30: 2 mg via INTRAVENOUS

## 2022-01-30 MED ORDER — KETAMINE HCL 50 MG/5ML IJ SOSY
PREFILLED_SYRINGE | INTRAMUSCULAR | Status: AC
  Filled 2022-01-30: qty 5

## 2022-01-30 MED ORDER — ACETAMINOPHEN 10 MG/ML IV SOLN: 1000.0000 mg | Freq: Once | INTRAVENOUS | Status: DC | PRN

## 2022-01-30 MED ORDER — ACETAMINOPHEN 500 MG PO TABS: 1000.0000 mg | ORAL_TABLET | Freq: Four times a day (QID) | ORAL | Status: DC

## 2022-01-30 MED ORDER — POTASSIUM CHLORIDE IN NACL 20-0.45 MEQ/L-% IV SOLN
INTRAVENOUS | Status: DC
  Filled 2022-01-30 (×10): qty 1000

## 2022-01-30 MED ORDER — ALBUMIN HUMAN 5 % IV SOLN: INTRAVENOUS | Status: DC | PRN

## 2022-01-30 MED ORDER — PROPOFOL 500 MG/50ML IV EMUL
INTRAVENOUS | Status: DC | PRN
  Administered 2022-01-30: 25 ug/kg/min via INTRAVENOUS

## 2022-01-30 MED ORDER — ROCURONIUM BROMIDE 10 MG/ML (PF) SYRINGE
PREFILLED_SYRINGE | INTRAVENOUS | Status: DC | PRN
  Administered 2022-01-30: 10 mg via INTRAVENOUS
  Administered 2022-01-30: 30 mg via INTRAVENOUS
  Administered 2022-01-30: 10 mg via INTRAVENOUS
  Administered 2022-01-30: 20 mg via INTRAVENOUS
  Administered 2022-01-30: 40 mg via INTRAVENOUS
  Administered 2022-01-30: 50 mg via INTRAVENOUS

## 2022-01-30 MED ORDER — DEXAMETHASONE SODIUM PHOSPHATE 4 MG/ML IJ SOLN
INTRAMUSCULAR | Status: DC | PRN
  Administered 2022-01-30: 5 mg via INTRAVENOUS

## 2022-01-30 MED ORDER — KETOROLAC TROMETHAMINE 15 MG/ML IJ SOLN: 15.0000 mg | INTRAMUSCULAR | Status: DC

## 2022-01-30 MED ORDER — FENTANYL CITRATE PF 50 MCG/ML IJ SOSY: 25.0000 ug | PREFILLED_SYRINGE | INTRAMUSCULAR | Status: DC | PRN

## 2022-01-30 MED ORDER — LACTATED RINGERS IV SOLN: INTRAVENOUS | Status: DC | PRN

## 2022-01-30 MED ORDER — DEXAMETHASONE SODIUM PHOSPHATE 10 MG/ML IJ SOLN
INTRAMUSCULAR | Status: AC
  Filled 2022-01-30: qty 1

## 2022-01-30 MED ORDER — HYDROMORPHONE HCL 1 MG/ML IJ SOLN
INTRAMUSCULAR | Status: DC | PRN
  Administered 2022-01-30 (×3): .4 mg via INTRAVENOUS

## 2022-01-30 MED ORDER — FENTANYL CITRATE (PF) 250 MCG/5ML IJ SOLN
INTRAMUSCULAR | Status: AC
  Filled 2022-01-30: qty 5

## 2022-01-30 MED ORDER — SIMETHICONE 80 MG PO CHEW
80.0000 mg | CHEWABLE_TABLET | Freq: Four times a day (QID) | ORAL | Status: DC | PRN
  Administered 2022-02-03: 80 mg via ORAL
  Filled 2022-01-30: qty 1

## 2022-01-30 MED ORDER — PHENYLEPHRINE 80 MCG/ML (10ML) SYRINGE FOR IV PUSH (FOR BLOOD PRESSURE SUPPORT)
PREFILLED_SYRINGE | INTRAVENOUS | Status: DC | PRN
  Administered 2022-01-30 (×3): 160 ug via INTRAVENOUS

## 2022-01-30 MED ORDER — 0.9 % SODIUM CHLORIDE (POUR BTL) OPTIME
TOPICAL | Status: DC | PRN
  Administered 2022-01-30: 2000 mL

## 2022-01-30 MED ORDER — HEPARIN SODIUM (PORCINE) 5000 UNIT/ML IJ SOLN
5000.0000 [IU] | INTRAMUSCULAR | Status: AC
  Administered 2022-01-30: 5000 [IU] via SUBCUTANEOUS
  Filled 2022-01-30: qty 1

## 2022-01-30 MED ORDER — MIDAZOLAM HCL 2 MG/2ML IJ SOLN
INTRAMUSCULAR | Status: AC
  Filled 2022-01-30: qty 2

## 2022-01-30 MED ORDER — OXYCODONE HCL 5 MG PO TABS
5.0000 mg | ORAL_TABLET | ORAL | Status: DC | PRN
  Administered 2022-01-30 – 2022-01-31 (×2): 10 mg via ORAL
  Filled 2022-01-30 (×2): qty 2

## 2022-01-30 MED ORDER — PHENYLEPHRINE 80 MCG/ML (10ML) SYRINGE FOR IV PUSH (FOR BLOOD PRESSURE SUPPORT)
PREFILLED_SYRINGE | INTRAVENOUS | Status: AC
  Filled 2022-01-30: qty 10

## 2022-01-30 MED ORDER — SODIUM CHLORIDE 0.9 % IV SOLN: 10.0000 mL/h | Freq: Once | INTRAVENOUS | Status: DC

## 2022-01-30 MED ORDER — ACETAMINOPHEN 500 MG PO TABS
1000.0000 mg | ORAL_TABLET | ORAL | Status: AC
  Administered 2022-01-30: 1000 mg via ORAL
  Filled 2022-01-30: qty 2

## 2022-01-30 MED ORDER — IOHEXOL 300 MG/ML  SOLN
INTRAMUSCULAR | Status: DC | PRN
  Administered 2022-01-30: 10 mL

## 2022-01-30 MED ORDER — DEXAMETHASONE SODIUM PHOSPHATE 4 MG/ML IJ SOLN: 4.0000 mg | INTRAMUSCULAR | Status: DC

## 2022-01-30 MED ORDER — SUGAMMADEX SODIUM 200 MG/2ML IV SOLN
INTRAVENOUS | Status: DC | PRN
  Administered 2022-01-30: 200 mg via INTRAVENOUS

## 2022-01-30 MED ORDER — SODIUM CHLORIDE (PF) 0.9 % IJ SOLN
INTRAMUSCULAR | Status: AC
  Filled 2022-01-30: qty 10

## 2022-01-30 MED ORDER — KCL IN DEXTROSE-NACL 20-5-0.45 MEQ/L-%-% IV SOLN
INTRAVENOUS | Status: DC
  Filled 2022-01-30 (×2): qty 1000

## 2022-01-30 MED ORDER — PROPOFOL 1000 MG/100ML IV EMUL
INTRAVENOUS | Status: AC
  Filled 2022-01-30: qty 100

## 2022-01-30 MED ORDER — ONDANSETRON HCL 4 MG PO TABS: 4.0000 mg | ORAL_TABLET | Freq: Four times a day (QID) | ORAL | Status: DC | PRN

## 2022-01-30 MED ORDER — ONDANSETRON HCL 4 MG/2ML IJ SOLN
INTRAMUSCULAR | Status: DC | PRN
  Administered 2022-01-30: 4 mg via INTRAVENOUS

## 2022-01-30 MED ORDER — LIDOCAINE 2% (20 MG/ML) 5 ML SYRINGE
INTRAMUSCULAR | Status: DC | PRN
  Administered 2022-01-30: 40 mg via INTRAVENOUS

## 2022-01-30 MED ORDER — SCOPOLAMINE 1 MG/3DAYS TD PT72
1.0000 | MEDICATED_PATCH | TRANSDERMAL | Status: AC
  Administered 2022-01-30: 1.5 mg via TRANSDERMAL
  Filled 2022-01-30: qty 1

## 2022-01-30 MED ORDER — STERILE WATER FOR IRRIGATION IR SOLN
Status: DC | PRN
  Administered 2022-01-30: 3000 mL

## 2022-01-30 MED ORDER — LIDOCAINE HCL (PF) 2 % IJ SOLN
INTRAMUSCULAR | Status: AC
  Filled 2022-01-30: qty 5

## 2022-01-30 MED ORDER — PHENYLEPHRINE HCL-NACL 20-0.9 MG/250ML-% IV SOLN
INTRAVENOUS | Status: AC
  Filled 2022-01-30: qty 250

## 2022-01-30 MED ORDER — HYDROMORPHONE HCL 1 MG/ML IJ SOLN
0.2000 mg | INTRAMUSCULAR | Status: DC | PRN
  Administered 2022-01-31 – 2022-02-03 (×3): 0.5 mg via INTRAVENOUS
  Filled 2022-01-30 (×3): qty 1

## 2022-01-30 MED ORDER — FENTANYL CITRATE (PF) 100 MCG/2ML IJ SOLN
INTRAMUSCULAR | Status: DC | PRN
  Administered 2022-01-30: 50 ug via INTRAVENOUS
  Administered 2022-01-30: 100 ug via INTRAVENOUS
  Administered 2022-01-30: 50 ug via INTRAVENOUS
  Administered 2022-01-30: 100 ug via INTRAVENOUS

## 2022-01-30 MED ORDER — POVIDONE-IODINE 10 % EX SWAB
2.0000 | Freq: Once | CUTANEOUS | Status: AC
  Administered 2022-01-30: 2 via TOPICAL

## 2022-01-30 MED ORDER — BUPIVACAINE HCL 0.25 % IJ SOLN
INTRAMUSCULAR | Status: AC
  Filled 2022-01-30: qty 1

## 2022-01-30 SURGICAL SUPPLY — 80 items
ADH SKN CLS APL DERMABOND .7 (GAUZE/BANDAGES/DRESSINGS)
AGENT HMST KT MTR STRL THRMB (HEMOSTASIS) ×2
APL PRP STRL LF DISP 70% ISPRP (MISCELLANEOUS) ×2
ATTRACTOMAT 16X20 MAGNETIC DRP (DRAPES) IMPLANT
BAG COUNTER SPONGE SURGICOUNT (BAG) IMPLANT
BAG SPNG CNTER NS LX DISP (BAG)
BLADE EXTENDED COATED 6.5IN (ELECTRODE) ×2 IMPLANT
CELLS DAT CNTRL 66122 CELL SVR (MISCELLANEOUS) IMPLANT
CHLORAPREP W/TINT 26 (MISCELLANEOUS) ×2 IMPLANT
CLIP TI LARGE 6 (CLIP) ×2 IMPLANT
CLIP TI MEDIUM 6 (CLIP) ×2 IMPLANT
CLIP TI MEDIUM LARGE 6 (CLIP) ×2 IMPLANT
CNTNR URN SCR LID CUP LEK RST (MISCELLANEOUS) IMPLANT
CONT SPEC 4OZ STRL OR WHT (MISCELLANEOUS)
DERMABOND ADVANCED .7 DNX12 (GAUZE/BANDAGES/DRESSINGS) IMPLANT
DISSECTOR ROUND CHERRY 3/8 STR (MISCELLANEOUS) IMPLANT
DRAIN CHANNEL 15F RND FF 3/16 (WOUND CARE) IMPLANT
DRAPE SURG IRRIG POUCH 19X23 (DRAPES) ×2 IMPLANT
DRAPE WARM FLUID 44X44 (DRAPES) ×2 IMPLANT
DRSG OPSITE POSTOP 4X10 (GAUZE/BANDAGES/DRESSINGS) IMPLANT
DRSG OPSITE POSTOP 4X6 (GAUZE/BANDAGES/DRESSINGS) IMPLANT
DRSG OPSITE POSTOP 4X8 (GAUZE/BANDAGES/DRESSINGS) IMPLANT
ELECT REM PT RETURN 15FT ADLT (MISCELLANEOUS) ×2 IMPLANT
EVACUATOR SILICONE 100CC (DRAIN) IMPLANT
GAUZE 4X4 16PLY ~~LOC~~+RFID DBL (SPONGE) ×2 IMPLANT
GAUZE SPONGE 4X4 12PLY STRL (GAUZE/BANDAGES/DRESSINGS) IMPLANT
GLOVE BIO SURGEON STRL SZ 6 (GLOVE) ×4 IMPLANT
GLOVE BIO SURGEON STRL SZ 6.5 (GLOVE) ×4 IMPLANT
GOWN STRL REUS W/ TWL LRG LVL3 (GOWN DISPOSABLE) ×4 IMPLANT
GOWN STRL REUS W/TWL LRG LVL3 (GOWN DISPOSABLE) ×4
GUIDEWIRE ANG ZIPWIRE 035X150 (WIRE) IMPLANT
HEMOSTAT ARISTA ABSORB 3G PWDR (HEMOSTASIS) IMPLANT
KIT BASIN OR (CUSTOM PROCEDURE TRAY) ×2 IMPLANT
KIT TURNOVER KIT A (KITS) IMPLANT
LIGASURE IMPACT 36 18CM CVD LR (INSTRUMENTS) IMPLANT
LOOP VESSEL MAXI BLUE (MISCELLANEOUS) IMPLANT
NDL HYPO 21X1.5 SAFETY (NEEDLE) ×4 IMPLANT
NEEDLE HYPO 21X1.5 SAFETY (NEEDLE) ×4 IMPLANT
NS IRRIG 1000ML POUR BTL (IV SOLUTION) ×4 IMPLANT
PACK GENERAL/GYN (CUSTOM PROCEDURE TRAY) ×2 IMPLANT
RELOAD PROXIMATE 75MM BLUE (ENDOMECHANICALS) ×2 IMPLANT
RELOAD STAPLE 75 3.8 BLU REG (ENDOMECHANICALS) IMPLANT
RETRACTOR WND ALEXIS 18 MED (MISCELLANEOUS) IMPLANT
RETRACTOR WND ALEXIS 25 LRG (MISCELLANEOUS) IMPLANT
RTRCTR WOUND ALEXIS 18CM MED (MISCELLANEOUS)
RTRCTR WOUND ALEXIS 25CM LRG (MISCELLANEOUS) ×2
SHEET LAVH (DRAPES) ×2 IMPLANT
SOL PREP POV-IOD 4OZ 10% (MISCELLANEOUS) ×2 IMPLANT
STAPLER CIRCULAR MANUAL XL 25 (STAPLE) IMPLANT
STAPLER CVD CUT GN 40 RELOAD (ENDOMECHANICALS) ×2 IMPLANT
STAPLER CVD CUT GRN 40 RELOAD (ENDOMECHANICALS) IMPLANT
STAPLER ECHELON POWER CIR 29 (STAPLE) IMPLANT
STAPLER GUN LINEAR PROX 60 (STAPLE) IMPLANT
STAPLER PROXIMATE 75MM BLUE (STAPLE) IMPLANT
STAPLER RELOADABLE 65 2-0 SUT (MISCELLANEOUS) IMPLANT
STAPLER SYS INTERNAL RELOAD SS (MISCELLANEOUS) ×2 IMPLANT
STENT URET 6FRX24 CONTOUR (STENTS) IMPLANT
SURGIFLO W/THROMBIN 8M KIT (HEMOSTASIS) IMPLANT
SUT MNCRL AB 4-0 PS2 18 (SUTURE) ×4 IMPLANT
SUT PDS AB 1 TP1 96 (SUTURE) ×4 IMPLANT
SUT VIC AB 0 CT1 27 (SUTURE) ×26
SUT VIC AB 0 CT1 27XBRD ANTBC (SUTURE) IMPLANT
SUT VIC AB 2-0 CT1 27 (SUTURE)
SUT VIC AB 2-0 CT1 36 (SUTURE) ×4 IMPLANT
SUT VIC AB 2-0 CT1 TAPERPNT 27 (SUTURE) ×4 IMPLANT
SUT VIC AB 2-0 CT2 27 (SUTURE) ×12 IMPLANT
SUT VIC AB 2-0 SH 27 (SUTURE)
SUT VIC AB 2-0 SH 27X BRD (SUTURE) IMPLANT
SUT VIC AB 3-0 CTX 36 (SUTURE) IMPLANT
SUT VIC AB 3-0 SH 18 (SUTURE) IMPLANT
SUT VIC AB 3-0 SH 27 (SUTURE) ×4
SUT VIC AB 3-0 SH 27X BRD (SUTURE) ×2 IMPLANT
SUT VIC AB 4-0 PS2 27 (SUTURE) IMPLANT
SUT VICRYL 0 TIES 12 18 (SUTURE) IMPLANT
SYR 30ML LL (SYRINGE) ×4 IMPLANT
TOWEL OR 17X26 10 PK STRL BLUE (TOWEL DISPOSABLE) ×2 IMPLANT
TOWEL OR NON WOVEN STRL DISP B (DISPOSABLE) ×2 IMPLANT
TRAY FOLEY MTR SLVR 14FR STAT (SET/KITS/TRAYS/PACK) IMPLANT
TRAY FOLEY MTR SLVR 16FR STAT (SET/KITS/TRAYS/PACK) ×2 IMPLANT
UNDERPAD 30X36 HEAVY ABSORB (UNDERPADS AND DIAPERS) ×2 IMPLANT

## 2022-01-30 NOTE — Plan of Care (Signed)
Problem: Clinical Measurements: Goal: Ability to maintain clinical measurements within normal limits will improve Outcome: Progressing   Problem: Coping: Goal: Level of anxiety will decrease Outcome: Progressing   Problem: Pain Managment: Goal: General experience of comfort will improve Outcome: [place], RN 01/30/22 9:54 AM

## 2022-01-30 NOTE — Consult Note (Signed)
[facility] Nurse requested for preoperative stoma site marking  Discussed surgical procedure and stoma creation with patient and family.  Explained role of the [facility] nurse team.  Provided the patient with educational booklet and provided samples of pouching options.  Answered patient and family questions.   Examined patient lying, sitting, and standing in order to place the marking in the patient's visual field, away from any creases or abdominal contour issues and within the rectus muscle.    Marked for colostomy in the LLQ  _3.5___ cm to the left of the umbilicus and _6.2___GB below the umbilicus.  Marked for ileostomy in the RLQ  _4.5___cm to the right of the umbilicus and  __1__ cm below the umbilicus.   Covered mark with thin film transparent dressing to preserve mark until date of surgery.   [facility] Nurse team will follow up with patient after surgery for continue ostomy care and teaching.  [place] MSN, [employer], [place], [place]

## 2022-01-30 NOTE — Transfer of Care (Signed)
Immediate Anesthesia Transfer of Care Note  Patient: Marie Hill  Procedure(s) Performed: OPEN unilateral SALPINGO OOPHORECTOMY,  hysterectomy  rectosigmoid resection and ileostomy CYSTOSCOPY WITH RETROGRADE PYELOGRAM/URETERAL STENT PLACEMENT (Right)  Patient Location: PACU  Anesthesia Type:General  Level of Consciousness: drowsy and patient cooperative  Airway & Oxygen Therapy: Patient Spontanous Breathing and Patient connected to face mask oxygen  Post-op Assessment: Report given to RN and Post -op Vital signs reviewed and stable  Post vital signs: Reviewed and stable  Last Vitals:  Vitals Value Taken Time  BP 141/100 01/30/22 1954  Temp    Pulse 104 01/30/22 1958  Resp 23 01/30/22 1958  SpO2 100 % 01/30/22 1958  Vitals shown include unvalidated device data.  Last Pain:  Vitals:   01/30/22 1242  TempSrc: Oral  PainSc:          Complications: No notable events documented.

## 2022-01-30 NOTE — Brief Op Note (Signed)
01/30/2022  7:48 PM  PATIENT:  Marie Hill  41 y.o. female  PRE-OPERATIVE DIAGNOSIS:  endometriosis, pelvic inflammaotry disease  POST-OPERATIVE DIAGNOSIS:  endometriosis and pelvic inflammatory disease  PROCEDURE:  Procedure(s): OPEN unilateral SALPINGO OOPHORECTOMY,  hysterectomy  rectosigmoid resection and ileostomy (N/A) CYSTOSCOPY WITH RETROGRADE PYELOGRAM/URETERAL STENT PLACEMENT (Right)  SURGEON:  Surgeon(s) and Role: Panel 1:    Lafonda Mosses, MD - Primary    Bernadene Bell, MD - Assisting Panel 2:    * Winter, Conception Oms, MD - Primary   ANESTHESIA:   general  EBL:  800 mL   BLOOD ADMINISTERED: 2u pRBC  DRAINS: 15 F Blake drain   LOCAL MEDICATIONS USED:  exparel and marcaine  SPECIMEN:  uterus, cervix, right tube and ovary, rectosigmoid colon  DISPOSITION OF SPECIMEN:  PATHOLOGY  COUNTS:  YES  TOURNIQUET:  * No tourniquets in log *  DICTATION: .Note written in EPIC  PLAN OF CARE: Admit to inpatient   PATIENT DISPOSITION:  PACU - hemodynamically stable.   Delay start of Pharmacological VTE agent (>24hrs) due to surgical blood loss or risk of bleeding: no

## 2022-01-30 NOTE — Anesthesia Procedure Notes (Signed)
Procedure Name: Intubation Date/Time: 01/30/2022 1:46 PM  Performed by: Claudia Desanctis, CRNAPre-anesthesia Checklist: Patient identified, Emergency Drugs available, Suction available and Patient being monitored Patient Re-evaluated:Patient Re-evaluated prior to induction Oxygen Delivery Method: Circle system utilized Preoxygenation: Pre-oxygenation with 100% oxygen Induction Type: IV induction Ventilation: Mask ventilation without difficulty Laryngoscope Size: 2 and Miller Grade View: Grade I Tube type: Oral Tube size: 7.0 mm Number of attempts: 1 Airway Equipment and Method: Stylet Placement Confirmation: ETT inserted through vocal cords under direct vision, positive ETCO2 and breath sounds checked- equal and bilateral Secured at: 21 cm Tube secured with: Tape Dental Injury: Teeth and Oropharynx as per pre-operative assessment

## 2022-01-30 NOTE — Anesthesia Preprocedure Evaluation (Addendum)
Anesthesia Evaluation  Patient identified by MRN, date of birth, ID band Patient awake    Reviewed: Allergy & Precautions, NPO status , Patient's Chart, lab work & pertinent test results  Airway Mallampati: II  TM Distance: >3 FB Neck ROM: Full    Dental no notable dental hx.    Pulmonary neg pulmonary ROS   Pulmonary exam normal        Cardiovascular negative cardio ROS  Rhythm:Regular Rate:Normal     Neuro/Psych negative neurological ROS  negative psych ROS   GI/Hepatic negative GI ROS, Neg liver ROS,,,  Endo/Other  diabetes, Type 2, Oral Hypoglycemic Agents    Renal/GU negative Renal ROS  Female GU complaint PID, tubo-ovarian abscess    Musculoskeletal negative musculoskeletal ROS (+)    Abdominal Normal abdominal exam  (+)   Peds  Hematology negative hematology ROS (+)   Anesthesia Other Findings   Reproductive/Obstetrics                             Anesthesia Physical Anesthesia Plan  ASA: 3  Anesthesia Plan: General   Post-op Pain Management:    Induction: Intravenous  PONV Risk Score and Plan: 3 and Ondansetron, Dexamethasone, Midazolam and Treatment may vary due to age or medical condition  Airway Management Planned: Oral ETT and Mask  Additional Equipment: None  Intra-op Plan:   Post-operative Plan: Extubation in OR  Informed Consent: I have reviewed the patients History and Physical, chart, labs and discussed the procedure including the risks, benefits and alternatives for the proposed anesthesia with the patient or authorized representative who has indicated his/her understanding and acceptance.     Dental advisory given  Plan Discussed with: CRNA  Anesthesia Plan Comments: (Lab Results      Component                Value               Date                      WBC                      6.1                 01/30/2022                HGB                       9.0 (L)             01/30/2022                HCT                      29.8 (L)            01/30/2022                MCV                      81.4                01/30/2022                PLT                      371  01/30/2022           )       Anesthesia Quick Evaluation

## 2022-01-30 NOTE — Progress Notes (Signed)
Gynecologic Oncology Progress Note  41 y.o. with a known history of endometriosis who developed a TOA after outpatient REI procedure who had failed outpatient management of TOA with worsening symptoms at home while on IV zosyn.   Surgical plan for today: Day of Surgery Procedure(s) (LRB): OPEN unilateral SALPINGO OOPHORECTOMY, possible hysterectomy (N/A)  Subjective: Patient reports feeling better. No fever symptoms or chills. No nausea or emesis. Feels almost back to normal. Having loose stools related to bowel prep. No pain reported.     Objective: Vital signs in last 24 hours: Temp:  [97.4 F (36.3 C)-98.7 F (37.1 C)] 98.7 F (37.1 C) (12/18 0509) Pulse Rate:  [80-89] 84 (12/18 0509) Resp:  [16-18] 16 (12/18 0509) BP: (103-142)/(76-93) 103/76 (12/18 0509) SpO2:  [97 %-100 %] 97 % (12/18 0509) Last BM Date : 01/29/22  Intake/Output from previous day: 12/17 0701 - 12/18 0700 In: 785.1 [P.O.:480; IV Piggyback:300.1] Out: 5 [Drains:5]  Physical Examination: General: alert, cooperative, and no distress Resp: clear to auscultation bilaterally Cardio: regular rate and rhythm, S1, S2 normal, no murmur, click, rub or gallop GI: soft, non-tender; bowel sounds normal; no masses,  no organomegaly Extremities: extremities normal, atraumatic, no cyanosis or edema  Labs: WBC/Hgb/Hct/Plts:  6.1/9.0/29.8/371 (12/18 0225) BUN/Cr/glu/ALT/AST/amyl/lip:  9/0.48/--/--/--/--/-- (12/18 0225)  Assessment: 41 y.o. with a known history of endometriosis who developed a TOA after outpatient REI procedure who had failed outpatient management of TOA with worsening symptoms at home while on IV zosyn. Plan for Procedure(s) today: OPEN unilateral SALPINGO OOPHORECTOMY, possible hysterectomy  Pain:  No pain reported. Medication ordered if needed.  Heme: Hgb 9.0 and Hct 29.8 this am-stable compared with previous values.  ID: WBC count at 6.1 this am. Currently on vancomycin and zosyn IV.   CV: BP and  HR stable. Continue to monitor with ordered vital signs.  GI:  Tolerating po: No: NPO for surgery.    GU: Creatinine 0.48 this a. Voiding adequate amounts.    FEN: No critical values on am labs. K+ 3.2.  Prophylaxis: SCDs. Heparin pre-op dose ordered.  Plan: Remain NPO for upcoming surgery today D51/2NS with 20 meq KCL to start Surgical plan discussed with patient and sister by Dr. Murtis Sink with Dr. Lovena Neighbours with Urology. Plan for stent in right ureter per Dr. Berline Lopes   LOS: 2 days    Dorothyann Gibbs 01/30/2022, 8:28 AM

## 2022-01-30 NOTE — Plan of Care (Signed)

## 2022-01-30 NOTE — Op Note (Signed)
Operative Note  PATIENT: Marie Hill DATE: 01/30/22  Preoperative Diagnosis: TOA after transvaginal drainage of endometrioma in outpatient REI setting, failed medical management; current infection present at the time of surgery  Postoperative Diagnosis: same as above, significant pelvic adhesions and retroperitoneal fibrosis   Surgery: Cystoscopy with right ureteral stent placement (Dr. Liliane Shi); exploratory laparotomy with en bloc resection including total hysterectomy, right salpingo-oophorectomy, appendectomy, and rectosigmoid resection, diverting loop ileostomy, and cystoscopy  Surgeons:  Carin Hock, MD; Clide Cliff, MD  Anesthesia: General   Estimated blood loss: 800 cc  IVF: see I&O flowsheet, 2u pRBCs  Urine output:  400 ml   Complications: None apparent  Pathology: Uterus, cervix, right tube and ovary en bloc with appendix and rectosigmoid colon  Operative findings: On EUA, fixed mass within the cul-de-sac, does not move, unable to differentiate the uterus from this mass.  On intra-abdominal inspection, normal-appearing upper abdomen and normal liver edge, diaphragm, stomach, and omentum on palpation.  Loop of ileum adherent to the uterine fundus.  Appendix densely adherent to the right tubo-ovarian complex.  Tissue of the pelvis significantly indurated and fibrotic.  Right ovary replaced by an approximately 10 cm mass including a 4-6 cystic component and other small components containing necrotic tissue.  Significant difficulty identifying either ureter bilaterally.  Left tube and ovary surgically absent.  Sigmoid colon somewhat redundant and adherent posteriorly to the uterus as well as the right tubo-ovarian complex.  Uterus approximately 8 cm and normal in appearance.   Ultimately, some necrotic tissue was left along the deep right pelvis.  Given concern for healing of the rectosigmoid anastomosis in the setting of chronic infection, diverting loop ileostomy  created.  Procedure: The patient was identified in the preoperative holding area. Informed consent was signed on the chart. Patient was seen history was reviewed and exam was performed.   The patient was then taken to the operating room and placed in the supine position with SCD hose on. General anesthesia was then induced without difficulty. She was then placed in the dorsolithotomy position. The abdomen was prepped with chlor prep sponges per protocol. Perineum was prepped with Betadine. The vagina was prepped with Betadine.  Patient was then draped after the prep was dried.  Timeout was performed to assure the correct patient, procedure, antibiotic, allergy.  The case was then turned over to Dr. Liliane Shi.  After placement of right ureteral stent and Foley catheter, attention was then turned to the abdomen.  A vertical paramedian incision was made and carried down to the underlying fascia using Bovie cautery. The fascia was scored in the fascial incision was extended superiorly and inferiorly using Bovie cautery. The rectus bellies were dissected off the overlying fascia. The peritoneum was tented and entered. The peritoneal incision was extended superiorly and inferiorly with visualization of the underlying peritoneal cavity.  The abdomen and pelvis were explored.  An Teacher, early years/pre and the Bookwalter self-retaining retractor was then placed. At the initial placement as well as at several points during the case the lateral blades were checked to ensure no significant pressure on the psoas bellies.  Given adherent appendix to the right tubo-ovarian complex, decision made to proceed with appendectomy.  The base of the appendix was identified.  Monopolar and bipolar electrocautery were used to skeletonize the appendix from the surrounding mesoappendix.  Bipolar electrocautery was used to cauterize and transect the appendiceal artery.  Once sufficiently skeletonized, the GIA stapler was used to staple and  transect across the base  of the appendix, freeing the appendix.  Using monopolar electrocautery, the cecum was mobilized superiorly.  On the left, the sigmoid colon was mobilized by incising along the white line of Toldt. The small bowel was packed out of the way of the surgical field with moist laparotomy sponges and malleable retractors were attached to the Bookwalter.   The right retroperitoneum was opened parallel to the IP ligament.  Significant fibrosis was noted along the sidewall.  Ultimately, the peritoneal incision had to be extended above the pelvic brim to identify the ureter, which was first identified on palpation with the aid of the ureteral stent.  Once the ureter had been definitively identified, the infundibulopelvic ligament was isolated, cauterized, and transected using the bipolar LigaSure device.  At this point, once the right adnexa had been freed from the IP attachment, the posterior aspect of the pelvis could be palpated somewhat and the sigmoid colon appeared densely adherent to the uterus and tubo-ovarian complex.  The decision was made to perform en bloc colon resection as well as total hysterectomy.  The round ligament on the patient's right side was transected with monopolar cautery the anterior leaf of the broad ligament was opened. The bladder flap was started at this point on the right.  A sponge stick was placed in the vagina to help delineate the cervicovaginal junction.  Attention was then turned to the left.  The left peritoneum was opened parallel to the pelvic sidewall, opening up the retroperitoneum.  The retroperitoneum on the side was also noted to be significantly fibrotic.  Due to this fibrosis and inflammation, it was very challenging to identify the ureter on this side.  The sigmoid colon was mobilized some superiorly to aid in this identification, ultimately the left ureter was able to be palpated deeply along the left pelvic sidewall.  The sigmoid colon was  noted to be quite redundant.  The proximal sigmoid colon, superior to the hairpin section of the sigmoid colon adherent to the uterine fundus and superior aspect of the tubo-ovarian complex, was identified. Hemostats were used with monopolar energy to skeletonize the sigmoid colon at this point of its fatty tissue and mesentery. A window was created in the mesentery at this point. The GIA stapling device was passed through the window and fired. The left ureter was palpated laterally, and the ligasure device was used to transect and seal the left colonic mesentery.  Distal branches of the IMA and IMV were identified during this dissection and sealed with bipolar electrocautery.  Attention was turned back to the left.  The left round ligament was transected with monopolar electrocautery and the anterior leaf of the broad ligament was opened.  The bladder flap was completed, ultimately mobilizing the bladder below the cervicovaginal junction.  The uterine vasculature was skeletonized and cauterized using bipolar electrocautery.  There was some mobilization of the right tube and ovary from the sidewall through blunt dissection.  Tissue along the right sidewall was noted to be quite necrotic.  Given concern that posterior identification of the bowel was good to be quite challenging, the decision was made to proceed with a reverse hysterectomy.  Monopolar electrocautery was used to incise the vagina anteriorly.  Heaney clamps were then used to take pedicles on each side of the vagina along the cervicovaginal junction.  These were each cut and suture-ligated using 0 Vicryl.  Posteriorly, the posterior aspect of the vagina was cauterized using monopolar electrocautery until the rectovaginal space was noted.  With careful dissection,  the rectovaginal space was transected using monopolar electrocautery until the uterus and cervix were freed posteriorly from the vagina and rectovaginal septum.  Attention was turned  posteriorly again.  With upward traction on the distal end of the transected sigmoid colon, the prerectal space was mobilized using monopolar and bipolar electrocautery.  The right tubo-ovarian complex was further mobilized from the right pelvic sidewall during this dissection.  Ultimately, an area of the rectum, inferior to the colonic attachment to the tubo-ovarian complex was identified.  The curved contour stapler was then used to staple and transect across the rectal tube, approximately 10 cm from the anal verge, and surrounding mesentery, ultimately freeing the en bloc rectosigmoid colon, uterus, cervix, and right adnexa.  This was handed off the field.  The distal rectum was examined.  An area along this was concerning for disruption of the muscularis but on further examination, this appeared more consistent with the necrotic tissue along the right pelvic sidewall.  Proximal end of the sigmoid was then brought down into the pelvis with plenty of mobilization for tension-free anastomosis.  The EEA sizers were used to measure the colonic width. A 29 mm EEA sizer was accommodated and selected.  Unfortunately, a 25 mm EEA stapler ultimately had to be used given some circumferential constriction of the rectum near the superior aspect of the retroperitoneal rectum.  The proximal sigmoid margin was opened and a pursestring suture was used circumferentially with prolene. The anvil from the EEA reanastamosing stapler was placed in the sigmoid colotomy site and the prolene was tied to secure it. The distal 2cm of this sigmoid colon margin was skeletonized free of its fatty tissue so that bare colonic tissue was exposed on the edges of the anvil. The rectal stump was inspected and skeletonized in a similar fashion. The EEA anastamosing device was inserted by the surgeon into the anus and gently advanced to the rectal stump.  During initial placement of the EEA sizer, there was disruption of the prior staple line.   The colon edges were grasped with Allises and a TA stapler was used to fire across and below the distal rectum.  The EEA sizer was again brought up to the distal rectum and the spike was deployed posterior to the transverse staple line on the rectal stump. The mesentery was confirmed to be free of the anatomosis. The EEA anastamosing stapler was closed and the sigmoid was delivered onto the rectal stump in a tension free manner. The stapler was activated and then removed.  There was some difficulty getting the stapler apart.  Only 1 definitively intact donut was visualized (appeared to be the proximal end given suture), but in manipulating the donut to remove it from the stapler itself, it is possible that the second donut was disrupted.  The staple line was examined and visually intact.  Anteriorly, several interrupted sutures using 2-0 Vicryl were used to oversew the staple line.  The pelvis was filled with saline, the sigmoid (proximal) occluded mannually and the rectum was filled with gas to confirm there were no leaks from the coloproctostomy staple line.  The distal sigmoid and rectum were then visualized with the rigid proctoscope and mucosa noted to be intact.  Given difficulty visualizing the left ureter, cystoscopy was performed after the bladder was backfilled with 200 cc of sterile fluid.  Bladder dome was noted to be intact.  Good efflux was noted from the left ureteral orifice.  The stent was removed from the right ureter.  Foley catheter was replaced subsequently.  Gloves were changed and attention was turned to the abdomen again.  Pelvis was irrigated again copiously and hemostasis was noted.  Given necrotic friable tissue along the right pelvic sidewall, Surgiflo was placed in this area. Surgeons and scrubs changed their gloves after each colonic anastamosis and again prior to closing skin (after closeing fascia).   A 15 mm Blake drain was placed within the right pelvis and exited the body  in the right lower quadrant.  Drain stitch was used to secure the drain to the abdomen.   The laparotomy sponges and Bookwalter retractor were then removed.  The small bowel was again inspected.  The area that had been adherent to the uterine fundus was identified and the planned site for the loop ileostomy.  This was noted to be more than 10 cm from the ileocecal junction.  The planned right lower quadrant ileostomy site was grasped with a Kocher and the skin elevated.  Monopolar electrocautery was used to excise a round skin incision.  The subcutaneous tissue was dissected down to the fascia which was opened in cruciate form.  The muscle fibers of the rectus were separated medial laterally and the peritoneal incision in size.  The incision was made large enough to assure 2 fingers could be easily passed through the incision.  The loop of small bowel to be used was then grasped and brought up through the incision to assure sufficient ileum above the level of the skin and assuring that the proximal ileum is located inferiorly.  Tonsils were then used to create a window within the mesentery below the ileum and a Penrose drain was passed through this.  Attention was turned back to the midline laparotomy incision.  The fascia was closed using running mass closure of #1 PDS. The subcutaneous tissues were irrigated and made hemostatic. Exparel mixed with 0.25% marcaine was injected for postoperative pain control. The subcutaneous tissue was reapproximated with 2-0 Vicryl. The skin was closed using staples.  The laparotomy incision was covered.  A longitudinal incision was made along the loop of ileum Allis clamps were then used to evert the wall of the bowel.  The stoma was matured using a combination of 2 and 3-0 Vicryl.  The finger was passed through each limb of the stoma ensuring easy passage without constriction.    An ostomy bag was placed over the ileostomy and a honey comb dressing over the midline  incision.   All instrument, suture, laparotomy, Ray-Tec, and needle counts were correct x2. The patient tolerated the procedure well and was taken recovery room in stable condition.   Eugene Garnet MD Gynecologic Oncology

## 2022-01-30 NOTE — Anesthesia Postprocedure Evaluation (Signed)
Anesthesia Post Note  Patient: Marie Hill  Procedure(s) Performed: OPEN unilateral SALPINGO OOPHORECTOMY,  hysterectomy  rectosigmoid resection and ileostomy CYSTOSCOPY WITH RETROGRADE PYELOGRAM/URETERAL STENT PLACEMENT (Right)     Patient location during evaluation: PACU Anesthesia Type: General Level of consciousness: awake and alert Pain management: pain level controlled Vital Signs Assessment: post-procedure vital signs reviewed and stable Respiratory status: spontaneous breathing, nonlabored ventilation, respiratory function stable and patient connected to nasal cannula oxygen Cardiovascular status: blood pressure returned to baseline and stable Postop Assessment: no apparent nausea or vomiting Anesthetic complications: no  No notable events documented.  Last Vitals:  Vitals:   01/30/22 2115 01/30/22 2136  BP: (!) 148/101 (!) 141/95  Pulse: 96 93  Resp: 19 14  Temp:  36.5 C  SpO2: 98% 99%    Last Pain:  Vitals:   01/30/22 2212  TempSrc:   PainSc: 7                  Barnet Glasgow

## 2022-01-30 NOTE — Progress Notes (Signed)
Gynecologic Oncology Symptom Management  Marie Hill is a 41 y.o. woman with known history of endometriosis who developed TOA after outpatient REI procedure (transvaginal endometrioma drainage) that failed outpatient therapy, most recently admitted and restarted on IV antibiotics which she has continued outpatient. She contacted the office on 01/23/2022 with symptoms of drainage and swelling around the drain insertion site. She also reported symptoms of increased warmth around the drain and pain. No fever or chills reported. Tolerating diet with no nausea or emesis reported. The drain itself has not been draining much and she has continued to flush the drain.  Alert, oriented, in no acute distress. Lungs clear. Heart regular in rate and rhythm. Dressing around right abdominal drain (placed by IR) removed without difficulty. No surrounding erythema, swelling, or increased warmth noted. Mild erythema around the drain insertion site which appears reactive. No drainage expressed with light palpation around the drain. Minimal amount of serosanguinous drainage noted in the drain. Drain tubing stripped and drain flushed with sterile normal saline 5 cc without difficulty. The skin suture appears to be pulling the skin and creating discomfort.   Drain care discussed and ways to limit pulling of the skin at the suture site. After dressing applied, patient reports feeling better. Reportable signs and symptoms reviewed. She has follow up scheduled with IR for drain evaluation. Advised to call the office for any needs or concerns and to follow up as planned.

## 2022-01-30 NOTE — Interval H&P Note (Signed)
History and Physical Interval Note:  01/30/2022 11:52 AM  Marie Hill  has presented today for surgery, with the diagnosis of endometrriosis, pelvic inflammaotry disease.  The various methods of treatment have been discussed with the patient and family. After consideration of risks, benefits and other options for treatment, the patient has consented to  Procedure(s): OPEN unilateral SALPINGO OOPHORECTOMY, possible hysterectomy (N/A) as a surgical intervention.  The patient's history has been reviewed, patient examined, no change in status, stable for surgery.  I have reviewed the patient's chart and labs.  Questions were answered to the patient's satisfaction.     Lafonda Mosses

## 2022-01-30 NOTE — Op Note (Signed)
Operative Note  Preoperative diagnosis:  1.  Right tubo-ovarian abscess  Postoperative diagnosis: 1.  Right tubo-ovarian abscess  Procedure(s): 1.  Cystoscopy with right ureteral stent placement 2.  Right retrograde pyelogram with intraoperative interpretation fluoroscopic imaging  Surgeon: Ellison Hughs, MD  Assistants:  None  Anesthesia:  General  Complications:  None  EBL: Less than 5 mL  Specimens: 1.  None  Drains/Catheters: 1.  Right 6 French, 24 cm JJ stent with tether secured to 14 [place] Foley catheter  Intraoperative findings:   Right retrograde pyelogram revealed luminal narrowing of the pelvic ureter with mild proximal dilation of the remaining ureter and renal pelvis.  No other filling defects were identified.  Indication:  Marie Hill is a 41 y.o. female with a right-sided tubo-ovarian abscess that is requiring open exploration with Dr. Berline Lopes.  Urology has been consulted to assist with ureteral stent placement to aid in ureteral identification intraoperatively.  Description of procedure:  After informed consent was obtained, the patient was brought to the operating room and general endotracheal anesthesia was administered. The patient was then placed in the dorsolithotomy position and prepped and draped in the usual sterile fashion. A timeout was performed. A 23 French rigid cystoscope was then inserted into the urethral meatus and advanced into the bladder under direct vision. A complete bladder survey revealed no intravesical pathology.  A 5 French ureteral catheter was then inserted into the right ureteral orifice and a retrograde pyelogram was obtained, with the findings listed above.  A Glidewire was then used to intubate the lumen of the ureteral catheter and was advanced up to the right renal pelvis, under fluoroscopic guidance.  The catheter was then removed, leaving the wire in place.  A 6 French, 24 cm JJ stent was then advanced up the right ureter  and into the right renal pelvis, confirming good placement via fluoroscopy.  The tether the stent was left intact.  A 14 French Foley catheter was then inserted into the bladder with return of clear urine.  10 mL sterile water was used to inflate the catheter balloon.  The tether of the stent was then secured to the Foley catheter.  Dr. Berline Lopes then proceeded with her portion of the case.  Plan: Removal of Foley catheter and ureteral stent is at the discretion of the primary team.

## 2022-01-30 NOTE — Consult Note (Signed)
Urology Consult   Physician requesting consult: Jeral Pinch, MD  Reason for consult: Stent placement   History of Present Illness: Marie Hill is a 41 y.o. female with a complex right adnexal mass/tubo-ovarian abscess causing sepsis and abdominal pain.  She is scheduled to undergo open right salpingectomy and possible hysterectomy with Dr. Berline Lopes this afternoon.  Urology has been consulted to perform cystoscopy with right ureteral stent placement to aid in ureteral identification intra-op.   The patient denies a history of voiding or storage urinary symptoms, hematuria, UTIs, STDs, urolithiasis, GU malignancy/trauma/surgery.  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   no meds 1997, treated in June 2016 x 1 month- states neg chest x ray June 2016    Past Surgical History:  Procedure Laterality Date   LAPAROSCOPIC UNILATERAL SALPINGO OOPHERECTOMY N/A 09/29/2014   Procedure: LAPAROSCOPIC LEFT SALPINGO OOPHORECTOMY ADHESIOLYSIS ;  Surgeon: Everitt Amber, MD;  Location: [facility];  Service: Gynecology;  Laterality: N/A;   ROBOTIC ASSISTED LAPAROSCOPIC OVARIAN CYSTECTOMY N/A 09/29/2014   Procedure: SI ROBOTIC ASSISTED LAPAROSCOPIC OVARIAN CYSTECTOMY;  Surgeon: Everitt Amber, MD;  Location: [facility];  Service: Gynecology;  Laterality: N/A;   TYMPANOPLASTY Left 08/19/2019   Procedure: LEFT TYMPANOPLASTY;  Surgeon: Rozetta Nunnery, MD;  Location: [facility];  Service: ENT;  Laterality: Left;    Current Hospital Medications:  Home Meds:  Current Meds  Medication Sig   acetaminophen (TYLENOL) 500 MG tablet Take 500-1,000 mg by mouth every 6 (six) hours as needed for mild pain or headache.   empagliflozin  (JARDIANCE) 25 MG TABS tablet Take 25 mg by mouth daily.   metFORMIN (GLUCOPHAGE-XR) 500 MG 24 hr tablet Take 1,000 mg by mouth 2 (two) times daily with a meal.   MIDOL COMPLETE 500-60-15 MG TABS Take 1-2 tablets by mouth 2 (two) times daily as needed (for pain).   piperacillin-tazobactam (ZOSYN) IVPB Inject 13.5 g into the vein daily for 13 days. Indication:  tubo-ovarian abscess First Dose: No Last Day of Therapy:  02/02/22 Labs - Once weekly:  CBC/D and BMP, Labs - Every other week:  ESR and CRP Method of administration: Administer Zosyn 13.5g IV daily as a continuous infusion.   Elastomeric (Continuous infusion) Method of administration may be changed at the discretion of home infusion pharmacist based upon assessment of the patient and/or caregiver's ability to self-administer the medication ordered.   rosuvastatin (CRESTOR) 40 MG tablet Take 40 mg by mouth daily.   Selenium Sulfide 2.25 % SHAM Apply 1 application  topically See admin instructions. Shampoo as directed   sodium chloride flush (NS) 0.9 % SOLN Flush right lower quadrant drain with 5-10 mL normal saline every day.   traMADol (ULTRAM) 50 MG tablet Take 50 mg by mouth every 6 (six) hours as needed for moderate pain or severe pain.    Scheduled Meds:  acetaminophen  1,000 mg Oral On Call to OR   Chlorhexidine Gluconate Cloth  6 each Topical Daily   dexamethasone (DECADRON) injection  4 mg Intravenous On Call to OR   heparin injection (subcutaneous)  5,000 Units Subcutaneous 120 min pre-op   insulin aspart  0-5 Units Subcutaneous  QHS   insulin aspart  0-9 Units Subcutaneous TID WC   ketorolac  15 mg Intravenous On Call to OR   povidone-iodine  2 Application Topical Once   scopolamine  1 patch Transdermal On Call to OR   Continuous Infusions:  sodium chloride 10 mL/hr at 01/28/22 1245   dextrose 5 % and 0.45 % NaCl with KCl 20 mEq/L 125 mL/hr at 01/30/22 1146   piperacillin-tazobactam (ZOSYN)  IV 3.375 g (01/30/22 1749)    vancomycin Stopped (01/29/22 2252)   PRN Meds:.sodium chloride, acetaminophen, sodium chloride flush  Allergies: No Known Allergies  Family History  Problem Relation Age of Onset   Hyperlipidemia Mother    Hypertension Mother    Hyperlipidemia Father     Social History:  reports that she has never smoked. She has never used smokeless tobacco. She reports that she does not currently use alcohol. She reports that she does not use drugs.  ROS: A complete review of systems was performed.  All systems are negative except for pertinent findings as noted.  Physical Exam:  Vital signs in last 24 hours: Temp:  [97.4 F (36.3 C)-98.7 F (37.1 C)] 98.7 F (37.1 C) (12/18 0509) Pulse Rate:  [80-89] 84 (12/18 0509) Resp:  [16-18] 16 (12/18 0509) BP: (103-142)/(76-93) 103/76 (12/18 0509) SpO2:  [97 %-100 %] 97 % (12/18 0509) Constitutional:  Alert and oriented, No acute distress Cardiovascular: Regular rate and rhythm, No JVD Respiratory: Normal respiratory effort, Lungs clear bilaterally GI: Abdomen is soft, nontender, nondistended, no abdominal masses GU: No CVA tenderness Lymphatic: No lymphadenopathy Neurologic: Grossly intact, no focal deficits Psychiatric: Normal mood and affect  Laboratory Data:  Recent Labs    01/27/22 1919 01/28/22 1710 01/29/22 0437 01/30/22 0225  WBC 16.8* 9.3 7.4 6.1  HGB 9.6* 8.9* 8.6* 9.0*  HCT 30.3* 29.0* 28.6* 29.8*  PLT 386 328 326 371    Recent Labs    01/27/22 1919 01/28/22 1710 01/29/22 0437 01/30/22 0225  NA 133* 138 139 140  K 2.9* 3.6 3.0* 3.2*  CL 99 109 111 111  GLUCOSE 91 164* 124* 104*  BUN 5* _0 CALCIUM 9.3 8.5* 8.8* 9.2  CREATININE 0.42* 0.46 <0.30* 0.48     Results for orders placed or performed during the hospital encounter of 01/27/22 (from the past 24 hour(s))  Glucose, capillary     Status: Abnormal   Collection Time: 01/29/22  1:00 PM  Result Value Ref Range   Glucose-Capillary 118 (H) 70 - 99 mg/dL   Glucose, capillary     Status: None   Collection Time: 01/29/22  4:05 PM  Result Value Ref Range   Glucose-Capillary 92 70 - 99 mg/dL  Glucose, capillary     Status: Abnormal   Collection Time: 01/29/22  9:49 PM  Result Value Ref Range   Glucose-Capillary 134 (H) 70 - 99 mg/dL  CBC     Status: Abnormal   Collection Time: 01/30/22  2:25 AM  Result Value Ref Range   WBC 6.1 4.0 - 10.5 K/uL   RBC 3.66 (L) 3.87 - 5.11 MIL/uL   Hemoglobin 9.0 (L) 12.0 - 15.0 g/dL   HCT 29.8 (L) 36.0 - 46.0 %   MCV 81.4 80.0 - 100.0 fL   MCH 24.6 (L) 26.0 - 34.0 pg   MCHC 30.2 30.0 - 36.0 g/dL   RDW 17.2 (H) 11.5 - 15.5 %   Platelets 371 150 - 400 K/uL   nRBC 0.0 0.0 - 0.2 %  Basic metabolic panel     Status: Abnormal   Collection Time: 01/30/22  2:25 AM  Result Value Ref Range   Sodium 140 135 - 145 mmol/L   Potassium 3.2 (L) 3.5 - 5.1 mmol/L   Chloride 111 98 - 111 mmol/L   CO2 21 (L) 22 - 32 mmol/L   Glucose, Bld 104 (H) 70 - 99 mg/dL   BUN 9 6 - 20 mg/dL   Creatinine, Ser 0.48 0.44 - 1.00 mg/dL   Calcium 9.2 8.9 - 10.3 mg/dL   GFR, Estimated >60 >60 mL/min   Anion gap 8 5 - 15  Magnesium     Status: None   Collection Time: 01/30/22  2:25 AM  Result Value Ref Range   Magnesium 2.1 1.7 - 2.4 mg/dL  Phosphorus     Status: None   Collection Time: 01/30/22  2:25 AM  Result Value Ref Range   Phosphorus 3.6 2.5 - 4.6 mg/dL  Surgical PCR screen     Status: None   Collection Time: 01/30/22  5:27 AM   Specimen: Nasal Mucosa; Nasal Swab  Result Value Ref Range   MRSA, PCR NEGATIVE NEGATIVE   Staphylococcus aureus NEGATIVE NEGATIVE  Glucose, capillary     Status: Abnormal   Collection Time: 01/30/22  8:04 AM  Result Value Ref Range   Glucose-Capillary 121 (H) 70 - 99 mg/dL  Glucose, capillary     Status: Abnormal   Collection Time: 01/30/22 12:01 PM  Result Value Ref Range   Glucose-Capillary 115 (H) 70 - 99 mg/dL   Recent Results (from the past 240 hour(s))  Blood culture (routine x 2)      Status: None (Preliminary result)   Collection Time: 01/27/22  7:19 PM   Specimen: BLOOD  Result Value Ref Range Status   Specimen Description   Final    BLOOD RIGHT ANTECUBITAL Performed at [facility], [address], [place], [place] [address]    Special Requests   Final    Blood Culture adequate volume BOTTLES DRAWN AEROBIC AND ANAEROBIC Performed at [facility], [address], [place], [place] [address]    Culture   Final    NO GROWTH 3 DAYS Performed at [facility], [place] [address] [place], [place] [address]    Report Status PENDING  Incomplete  Resp panel by RT-PCR (RSV, Flu A&B, Covid) Anterior Nasal Swab     Status: None   Collection Time: 01/27/22  7:19 PM   Specimen: Anterior Nasal Swab  Result Value Ref Range Status   SARS Coronavirus 2 by RT PCR NEGATIVE NEGATIVE Final    Comment: (NOTE) SARS-CoV-2 target nucleic acids are NOT DETECTED.  The SARS-CoV-2 RNA is generally detectable in upper respiratory specimens during the acute phase of infection. The lowest concentration of SARS-CoV-2 viral copies this assay can detect is 138 copies/mL. A negative result does not preclude SARS-Cov-2 infection and should not be used as the sole basis for treatment or other patient management decisions. A negative result may occur with  improper specimen collection/handling, submission of specimen other than nasopharyngeal swab, presence of viral mutation(s) within the areas targeted by this assay, and inadequate number of viral copies(<138 copies/mL). A negative result must be combined with clinical observations, patient history, and epidemiological information. The expected result is Negative.  Fact Sheet for Patients:  EntrepreneurPulse.com.au  Fact Sheet for Healthcare Providers:  IncredibleEmployment.be  This test is no t yet approved or cleared by the [place]  FDA and  has been  authorized for detection and/or diagnosis of SARS-CoV-2 by FDA under an Emergency Use Authorization (EUA). This EUA will remain  in effect (meaning this test can be used) for the duration of the COVID-19 declaration under Section 564(b)(1) of the Act, 21 U.S.C.section 360bbb-3(b)(1), unless the authorization is terminated  or revoked sooner.       Influenza A by PCR NEGATIVE NEGATIVE Final   Influenza B by PCR NEGATIVE NEGATIVE Final    Comment: (NOTE) The Xpert Xpress SARS-CoV-2/FLU/RSV plus assay is intended as an aid in the diagnosis of influenza from Nasopharyngeal swab specimens and should not be used as a sole basis for treatment. Nasal washings and aspirates are unacceptable for Xpert Xpress SARS-CoV-2/FLU/RSV testing.  Fact Sheet for Patients: EntrepreneurPulse.com.au  Fact Sheet for Healthcare Providers: IncredibleEmployment.be  This test is not yet approved or cleared by the [place] FDA and has been authorized for detection and/or diagnosis of SARS-CoV-2 by FDA under an Emergency Use Authorization (EUA). This EUA will remain in effect (meaning this test can be used) for the duration of the COVID-19 declaration under Section 564(b)(1) of the Act, 21 U.S.C. section 360bbb-3(b)(1), unless the authorization is terminated or revoked.     Resp Syncytial Virus by PCR NEGATIVE NEGATIVE Final    Comment: (NOTE) Fact Sheet for Patients: EntrepreneurPulse.com.au  Fact Sheet for Healthcare Providers: IncredibleEmployment.be  This test is not yet approved or cleared by the [place] FDA and has been authorized for detection and/or diagnosis of SARS-CoV-2 by FDA under an Emergency Use Authorization (EUA). This EUA will remain in effect (meaning this test can be used) for the duration of the COVID-19 declaration under Section 564(b)(1) of the Act, 21 U.S.C. section 360bbb-3(b)(1), unless the  authorization is terminated or revoked.  Performed at [facility], [address] [place], [place] [address]   Blood culture (routine x 2)     Status: None (Preliminary result)   Collection Time: 01/27/22  7:21 PM   Specimen: BLOOD  Result Value Ref Range Status   Specimen Description   Final    BLOOD BLOOD RIGHT HAND Performed at [facility], [address] [place], [place] [address]    Special Requests   Final    Blood Culture adequate volume BOTTLES DRAWN AEROBIC AND ANAEROBIC Performed at [facility], [address], [place], [place] [address]    Culture   Final    NO GROWTH 3 DAYS Performed at [facility], [place] [address], [place], [place] [address]    Report Status PENDING  Incomplete  Surgical PCR screen     Status: None   Collection Time: 01/30/22  5:27 AM   Specimen: Nasal Mucosa; Nasal Swab  Result Value Ref Range Status   MRSA, PCR NEGATIVE NEGATIVE Final   Staphylococcus aureus NEGATIVE NEGATIVE Final    Comment: (NOTE) The Xpert SA Assay (FDA approved for NASAL specimens in patients 87 years of age and older), is one component of a comprehensive surveillance program. It is not intended to diagnose infection nor to guide or monitor treatment. Performed at [facility], [place] [address], [place], [place] [address]     Renal Function: Recent Labs    01/27/22 1202 01/27/22 1919 01/28/22 1710 01/29/22 0437 01/30/22 0225  CREATININE 0.53 0.42* 0.46 <0.30* 0.48   Estimated Creatinine Clearance: 71.9 mL/min (by C-G formula based on SCr of 0.48 mg/dL).  Radiologic Imaging: No results found.  I independently  reviewed the above imaging studies.  Impression/Recommendation 41 year old female with a right sided tubo-ovarian abscess  -The risks, benefits and alternatives of cystoscopy with RIGHT JJ stent placement was discussed with the patient.  Risks include, but are not  limited to: bleeding, urinary tract infection, ureteral injury, ureteral stricture disease, chronic pain, urinary symptoms, bladder injury, stent migration, the need for nephrostomy tube placement, MI, CVA, DVT, PE and the inherent risks with general anesthesia.  The patient voices understanding and wishes to proceed.   Ellison Hughs, MD [facility] 01/30/2022, 12:34 PM

## 2022-01-31 ENCOUNTER — Encounter (HOSPITAL_COMMUNITY): Payer: Self-pay | Admitting: Gynecologic Oncology

## 2022-01-31 LAB — PHOSPHORUS: Phosphorus: 5.2 mg/dL — ABNORMAL HIGH (ref 2.5–4.6)

## 2022-01-31 LAB — TYPE AND SCREEN
ABO/RH(D): B POS
Antibody Screen: NEGATIVE
Unit division: 0
Unit division: 0

## 2022-01-31 LAB — BASIC METABOLIC PANEL
Anion gap: 8 (ref 5–15)
BUN: 8 mg/dL (ref 6–20)
CO2: 22 mmol/L (ref 22–32)
Calcium: 8.5 mg/dL — ABNORMAL LOW (ref 8.9–10.3)
Chloride: 108 mmol/L (ref 98–111)
Creatinine, Ser: 0.53 mg/dL (ref 0.44–1.00)
GFR, Estimated: >60 mL/min (ref >60)
Glucose, Bld: 151 mg/dL — ABNORMAL HIGH (ref 70–99)
Potassium: 3.6 mmol/L (ref 3.5–5.1)
Sodium: 138 mmol/L (ref 135–145)

## 2022-01-31 LAB — GLUCOSE, CAPILLARY
Glucose-Capillary: 144 mg/dL — ABNORMAL HIGH (ref 70–99)
Glucose-Capillary: 179 mg/dL — ABNORMAL HIGH (ref 70–99)
Glucose-Capillary: 125 mg/dL — ABNORMAL HIGH (ref 70–99)
Glucose-Capillary: 235 mg/dL — ABNORMAL HIGH (ref 70–99)

## 2022-01-31 LAB — MAGNESIUM: Magnesium: 1.6 mg/dL — ABNORMAL LOW (ref 1.7–2.4)

## 2022-01-31 LAB — CBC
HCT: 33.8 % — ABNORMAL LOW (ref 36.0–46.0)
Hemoglobin: 11 g/dL — ABNORMAL LOW (ref 12.0–15.0)
MCH: 26.7 pg (ref 26.0–34.0)
MCHC: 32.5 g/dL (ref 30.0–36.0)
MCV: 82 fL (ref 80.0–100.0)
Platelets: 318 10*3/uL (ref 150–400)
RBC: 4.12 MIL/uL (ref 3.87–5.11)
RDW: 16.5 % — ABNORMAL HIGH (ref 11.5–15.5)
WBC: 13.6 10*3/uL — ABNORMAL HIGH (ref 4.0–10.5)
nRBC: 0 % (ref 0.0–0.2)

## 2022-01-31 LAB — BPAM RBC
Blood Product Expiration Date: 202401122359
Blood Product Expiration Date: 202401122359
ISSUE DATE / TIME: 202312181634
ISSUE DATE / TIME: 202312181634
Unit Type and Rh: 7300
Unit Type and Rh: 7300

## 2022-01-31 MED ORDER — OXYCODONE HCL 5 MG PO TABS: 5.0000 mg | ORAL_TABLET | ORAL | Status: DC

## 2022-01-31 MED ORDER — OXYCODONE HCL 5 MG PO TABS
5.0000 mg | ORAL_TABLET | ORAL | Status: DC
  Administered 2022-01-31 (×2): 10 mg via ORAL
  Administered 2022-02-01: 5 mg via ORAL
  Administered 2022-02-01 – 2022-02-03 (×14): 10 mg via ORAL
  Filled 2022-01-31 (×18): qty 2

## 2022-01-31 MED ORDER — MAGNESIUM SULFATE 2 GM/50ML IV SOLN
2.0000 g | Freq: Once | INTRAVENOUS | Status: AC
  Administered 2022-01-31: 2 g via INTRAVENOUS
  Filled 2022-01-31: qty 50

## 2022-01-31 NOTE — Progress Notes (Signed)
Mobility Specialist - Progress Note   01/31/22 1029  Mobility  Activity Ambulated with assistance in hallway  Level of Assistance Moderate assist, patient does 50-74%  Assistive Device Other (Comment) (HHA ; IV Pole)  Distance Ambulated (ft) 12 ft  Range of Motion/Exercises Active  Activity Response Tolerated well  Mobility Referral Yes  $Mobility charge 1 Mobility   Pt was found on recliner chair and agreeable to ambulate. C/o pain everywhere stated that every small thing made her pain worse and she is feeling very weak. From sit to stand required mod-A and also during ambulation through [facility]. At EOS returned to recliner chair with necessities in reach. RN notified of session.  Ferd Hibbs Mobility Specialist

## 2022-01-31 NOTE — Progress Notes (Addendum)
Gynecologic Oncology Progress Note  Patient is alert, oriented, resting in bed, appearing fatigued. Continues to report abdominal soreness. She felt the pain medication worked from 6 am- 4pm but having more pain/ soreness now. Has been sipping on water and felt the broth provided was too salty. No nausea or emesis reported but having upper abdominal cramping. She reports her lower abdomen as being very sensitive with pain felt when moving the sheets on her bed. She ambulated with PT today but felt her legs were very weak. Sat up in chair this am.  Abdominal incision dressing intactKpad ordered. Oral pain medication now scheduled. Patient informed of this and advised she can refuse this if not needed. Am labs. Continue plan of care. Maintain IVF hydration given low po intake. Foley to remain for strict intake and output and given limited mobility.

## 2022-01-31 NOTE — Consult Note (Signed)
[facility] Nurse ostomy consult note Stoma type/location: RLQ, loop ileostomy  Stomal assessment/size: 1 1/2" round, budded, red rubber support bridge in place  Peristomal assessment: NA Treatment options for stomal/peristomal skin: NA Output  Ostomy pouching: 1pc.soft convex in place from OR  Education provided: limited, patient not engaged, pain management issues. She has been up in the chair and ambulated. No pain meds since 0530am.  Encourage her to ask for pain meds. Notified Dr. Berline Lopes as well  Enrolled patient in [place] program: Yes  Will plan pouch change in the am, patient aware. Will call ahead for pain meds   WOC Nurse will follow along with you for continued support with ostomy teaching and care [place] MSN, [place], [place], [place], [place]

## 2022-01-31 NOTE — Progress Notes (Addendum)
Gynecologic Oncology Progress Note  41 y.o. with a known history of endometriosis who developed a TOA after outpatient REI procedure who had failed outpatient management of TOA with worsening symptoms at home while on IV zosyn.   Surgery performed on 01/30/2022: 1 Day Post-Op Procedure(s) (LRB): OPEN unilateral SALPINGO OOPHORECTOMY,  hysterectomy  rectosigmoid resection and ileostomy (N/A) CYSTOSCOPY WITH RETROGRADE PYELOGRAM/URETERAL STENT PLACEMENT (Right)  Subjective: Patient reports feeling tired and sore. No nausea or emesis. Has not ambulated in hall since surgery. Up in the chair. No concerns voiced.  Objective: Vital signs in last 24 hours: Temp:  [97.5 F (36.4 C)-98.4 F (36.9 C)] 97.7 F (36.5 C) (12/19 0611) Pulse Rate:  [89-105] 89 (12/19 0611) Resp:  [14-22] 20 (12/19 0611) BP: (123-152)/(78-104) 148/92 (12/19 0611) SpO2:  [96 %-100 %] 96 % (12/19 0611) Last BM Date : 01/29/22  Intake/Output from previous day: 12/18 0701 - 12/19 0700 In: 4971.7 [P.O.:450; I.V.:3469.7; Blood:630; IV Piggyback:422] Out: 2337.5 [Urine:1525; Drains:12.5; Blood:800]  Physical Examination (performed by Dr. Berline Lopes): General: alert, cooperative, no distress, and appears tired Resp: clear to auscultation bilaterally Cardio: regular rate and rhythm, S1, S2 normal, no murmur, click, rub or gallop GI: incision: midline incision with op site dressing in place with no drainage noted underneath and abdomen soft, mildly hypoactive bowel sounds, ileostomy with bag intact-plan for WOC consult today for bag change to be able to visualize the stoma Extremities: extremities normal, atraumatic, no cyanosis or edema Foley in place with lightly dark blood tinged urine  Labs: WBC/Hgb/Hct/Plts:  13.6/11.0/33.8/318 (12/19 0419) BUN/Cr/glu/ALT/AST/amyl/lip:  8/0.53/--/--/--/--/-- (12/19 7322)  Assessment: 41 y.o. with a known history of endometriosis who developed a TOA after outpatient REI procedure who  had failed outpatient management of TOA with worsening symptoms at home while on IV zosyn. S/P OPEN unilateral SALPINGO OOPHORECTOMY,  hysterectomy  rectosigmoid resection and ileostomy (N/A) CYSTOSCOPY WITH RETROGRADE PYELOGRAM/URETERAL STENT PLACEMENT (Right) on 01/30/2022.  Pain:  Abdominal soreness reported. PRN medications ordered if needed.  Heme: Hgb 11.0 and Hct 33.8 this am. Given 2 units PRBCs intra-op. EBL 800.  ID: WBC count 13.6 this am from 6.1 on 12/18. Currently on vancomycin and zosyn IV. IV decadron given intra-op. Continue to monitor. Plan for IV antibiotics for 24 hours post-op.  CV: BP and HR stable. Continue to monitor with ordered vital signs.  GI:  Tolerating po: sips of water. If tolerating increasing intake of liquids, plan for advancement of diet    GU: Creatinine 0.53 this am. Right ureter stent removed intra-op. Adequate output reported.  FEN: No critical values on am labs. K+ 3.6 this am.  Prophylaxis: SCDs. Lovenox ordered to start later today.  Plan: Increase intake of liquids. If tolerating this, plan for advancement of diet as tolerated. Increase mobility. If stable with ambulation, plan for foley removal with continued strict intake and output WOC consult today for ileostomy bag change, teaching, recommendations Continue IV antibiotics for 24 hours post-op Am labs. Replacing Magnesium. Continue plan of care per Dr. Berline Lopes Expect patient to be inpatient up until Friday, subject to change based on medical status and progression   LOS: 3 days    Marie Hill 01/31/2022, 8:36 AM

## 2022-02-01 ENCOUNTER — Other Ambulatory Visit: Payer: Commercial Managed Care - HMO

## 2022-02-01 LAB — BASIC METABOLIC PANEL
Anion gap: 7 (ref 5–15)
BUN: <5 mg/dL — ABNORMAL LOW (ref 6–20)
CO2: 25 mmol/L (ref 22–32)
Calcium: 8.3 mg/dL — ABNORMAL LOW (ref 8.9–10.3)
Chloride: 105 mmol/L (ref 98–111)
Creatinine, Ser: 0.44 mg/dL (ref 0.44–1.00)
GFR, Estimated: >60 mL/min (ref >60)
Glucose, Bld: 130 mg/dL — ABNORMAL HIGH (ref 70–99)
Potassium: 3.7 mmol/L (ref 3.5–5.1)
Sodium: 137 mmol/L (ref 135–145)

## 2022-02-01 LAB — CULTURE, BLOOD (ROUTINE X 2)
Culture: NO GROWTH
Culture: NO GROWTH
Special Requests: ADEQUATE
Special Requests: ADEQUATE

## 2022-02-01 LAB — CBC
HCT: 29.4 % — ABNORMAL LOW (ref 36.0–46.0)
Hemoglobin: 9.3 g/dL — ABNORMAL LOW (ref 12.0–15.0)
MCH: 26.6 pg (ref 26.0–34.0)
MCHC: 31.6 g/dL (ref 30.0–36.0)
MCV: 84 fL (ref 80.0–100.0)
Platelets: 296 10*3/uL (ref 150–400)
RBC: 3.5 MIL/uL — ABNORMAL LOW (ref 3.87–5.11)
RDW: 17.2 % — ABNORMAL HIGH (ref 11.5–15.5)
WBC: 15.5 10*3/uL — ABNORMAL HIGH (ref 4.0–10.5)
nRBC: 0 % (ref 0.0–0.2)

## 2022-02-01 LAB — GLUCOSE, CAPILLARY
Glucose-Capillary: 117 mg/dL — ABNORMAL HIGH (ref 70–99)
Glucose-Capillary: 166 mg/dL — ABNORMAL HIGH (ref 70–99)
Glucose-Capillary: 171 mg/dL — ABNORMAL HIGH (ref 70–99)
Glucose-Capillary: 170 mg/dL — ABNORMAL HIGH (ref 70–99)

## 2022-02-01 LAB — PHOSPHORUS: Phosphorus: 3 mg/dL (ref 2.5–4.6)

## 2022-02-01 LAB — MAGNESIUM: Magnesium: 1.8 mg/dL (ref 1.7–2.4)

## 2022-02-01 MED ORDER — METFORMIN HCL 500 MG PO TABS: 1000.0000 mg | ORAL_TABLET | Freq: Two times a day (BID) | ORAL | 3 refills | Status: AC

## 2022-02-01 NOTE — Progress Notes (Signed)
Mobility Specialist - Progress Note   02/01/22 0916  Mobility  Activity Ambulated with assistance in hallway  Level of Assistance Minimal assist, patient does 75% or more  Assistive Device Other (Comment) (HHA ; IV Pole)  Distance Ambulated (ft) 80 ft  Range of Motion/Exercises Active  Activity Response Tolerated well  Mobility Referral Yes  $Mobility charge 1 Mobility   Pt was found in bed and agreeable to ambulate. Stated feeling much better than yesterday despite still having some pain. Was min-A getting out of bed to stand and during ambulation through HHA. At EOS returned to bed with necessities in reach and RN notified of session.   Ferd Hibbs Mobility Specialist

## 2022-02-01 NOTE — Progress Notes (Signed)
Pharmacy Antibiotic Note  Marie Hill is a 41 y.o. female admitted on 01/27/2022 with  abd abscess .  Pharmacy has been consulted for vanc/cefepime dosing.  Pt with ongoing issue with TOA and infection. Presented with several days of fever. Empiric vanc/cefepime ordered again. She has been on abx prior to admission.   Today, 02/01/22 - Pt remains on abx. Current plan is to continue abx due to increase in WBC count. Plan to re-evaluate tomorrow am based on CBC results.  - Renal function has remained stable of vancomycin/Zosyn combo.   Plan: - Continue Vanc 1g IV q24>>AUC 422, scr 0.8 - Continue Cefepime 2g IV q8  Height: 5' 1.81" (157 cm) Weight: 50 kg (110 lb 3.7 oz) IBW/kg (Calculated) : 49.67  Temp (24hrs), Avg:98.4 F (36.9 C), Min:98.1 F (36.7 C), Max:98.8 F (37.1 C)  Recent Labs  Lab 01/27/22 1919 01/28/22 1710 01/29/22 0437 01/30/22 0225 01/30/22 2057 01/31/22 0419 02/01/22 0308  WBC 16.8*   < > 7.4 6.1 17.7* 13.6* 15.5*  CREATININE 0.42*   < > <0.30* 0.48 0.52 0.53 0.44  LATICACIDVEN 0.7  --   --   --   --   --   --    < > = values in this interval not displayed.     Estimated Creatinine Clearance: 71.9 mL/min (by C-G formula based on SCr of 0.44 mg/dL).    No Known Allergies  Antimicrobials this admission: 12/15 vanc>> 12/15 cefepime>> 12/17  12/17 Zosyn >>   Dose adjustments this admission:   Microbiology results: 12/15 blood>> NGF   Royetta Asal, PharmD, BCPS 02/01/2022 10:24 AM

## 2022-02-01 NOTE — Consult Note (Signed)
[place] Nurse ostomy follow up Stoma type/location: RUQ, loop ileostomy  Stomal assessment/size: slightly larger than 1 1/2" round, budded, pink, moist. Red rubber support bridge in place Peristomal assessment: intact  Treatment options for stomal/peristomal skin: 2" skin barrier rings Output: scant, bloody Ostomy pouching: 2pc with 2" skin barrier ring Education provided with patient, she does not wish to have her family involved  Explained role of ostomy nurse and creation of stoma  Explained stoma characteristics (budded, flush, color, texture, care) Demonstrated pouch change (cutting new skin barrier, measuring stoma, cleaning peristomal skin and stoma, use of barrier ring) Education on emptying when 1/3 to 1/2 full and how to empty Demonstrated use of wick to clean spout  Discussed bathing, diet, gas, medication use, constipation, diarrhea, dehydration  Discussed food blockage    Patient takes several meds that may be extended release, I will discuss with surgeon for DC needs for these meds in relationship to her new ileostomy. May need pharmacy consult to adjust accordingly.     Enrolled patient in [place] Start Discharge program: Yes  [place] Nurse will follow along with you for continued support with ostomy teaching and care [place] MSN, [place], [place], [place], [place]

## 2022-02-01 NOTE — Progress Notes (Signed)
Gynecologic Oncology Progress Note  41 y.o. with a known history of endometriosis who developed a TOA after outpatient REI procedure who had failed outpatient management of TOA with worsening symptoms at home while on IV zosyn.   Surgery performed on 01/30/2022: 2 Days Post-Op Procedure(s) (LRB): OPEN unilateral SALPINGO OOPHORECTOMY,  hysterectomy  rectosigmoid resection and ileostomy (N/A) CYSTOSCOPY WITH RETROGRADE PYELOGRAM/URETERAL STENT PLACEMENT (Right)  Subjective: Patient reports feeling better this am. Pain is better controlled. No fever or chills. No symptoms of "infection pain" experienced previously. Tolerating small amounts but has decreased appetite. Abdominal sensitivity has improved. No nausea or emesis. Got up in the chair and ambulated short distance with PT yesterday. No concerns voiced.  Objective: Vital signs in last 24 hours: Temp:  [97.7 F (36.5 C)-98.8 F (37.1 C)] 98.3 F (36.8 C) (12/20 0521) Pulse Rate:  [89-103] 94 (12/20 0521) Resp:  [14-18] 18 (12/20 0521) BP: (135-148)/(86-94) 135/86 (12/20 0521) SpO2:  [97 %-100 %] 97 % (12/20 0521) Last BM Date : 01/29/22  Intake/Output from previous day: 12/19 0701 - 12/20 0700 In: 3439 [P.O.:720; I.V.:2310.7; IV Piggyback:408.3] Out: 3650 [Urine:3525; Drains:25; Stool:100]  Physical Examination (performed by Dr. Berline Lopes): General: alert, cooperative, no distress, and appears tired Resp: clear to auscultation bilaterally Cardio: regular rate and rhythm, S1, S2 normal, no murmur, click, rub or gallop GI: incision: midline incision with op site dressing in place with no drainage noted underneath and abdomen soft, mildly hypoactive bowel sounds, ileostomy with bag intact, stoma pink with rubber support bridge present, bloody output Extremities: extremities normal, atraumatic, no cyanosis or edema Foley in place with lightly pink urine  Labs: WBC/Hgb/Hct/Plts:  15.5/9.3/29.4/296 (12/20 0308)  BUN/Cr/glu/ALT/AST/amyl/lip:  <5/0.44/--/--/--/--/-- (12/20 0308)  Assessment: 41 y.o. with a known history of endometriosis who developed a TOA after outpatient REI procedure who had failed outpatient management of TOA with worsening symptoms at home while on IV zosyn. S/P OPEN unilateral SALPINGO OOPHORECTOMY,  hysterectomy  rectosigmoid resection and ileostomy (N/A) CYSTOSCOPY WITH RETROGRADE PYELOGRAM/URETERAL STENT PLACEMENT (Right) on 01/30/2022.  Pain:  Abdominal soreness improving with scheduled pain medication. PRN medications ordered if needed.  Heme: Hgb 9.3 from 11.0 yesterday and Hct 29.4 this am from 33.8 yesterday am. Given 2 units PRBCs intra-op. EBL 800.  ID: WBC count 15.5 this am from 13.6 on 12/19. Given increase plan to currently on vancomycin and zosyn IV. IV decadron given intra-op. Continue to monitor. Reassess with am CBC results.  CV: BP and HR stable. Continue to monitor with ordered vital signs.  GI:  Tolerating po: yes but decreased appetite. Diet as tolerated. S/p ileostomy with rectosigmoid resection. Ileostomy with bloody output.   GU: Creatinine 0.44 this am from 0.53 yesterday am. Right ureter stent removed intra-op. Adequate output reported. Adequate output reported.  FEN: No critical values on am labs. K+ 3.7 this am. Magnesium replaced 12/19 and currently 1.8 this am.   Prophylaxis: SCDs. Lovenox ordered.  Plan: IVF to 25 cc/hr Encourage increasing mobility WOC consultation to continue today for ileostomy bag change, teaching, recommendations Continue current regimen of IV antibiotics given increase in WBC count. Plan to re-evaluate tomorrow am based on CBC results.  Am labs.  Continue plan of care per Dr. Berline Lopes Expect patient to be inpatient up until Friday, subject to change based on medical status and progression   LOS: 4 days    Marie Hill 02/01/2022, 8:01 AM

## 2022-02-02 LAB — GLUCOSE, CAPILLARY
Glucose-Capillary: 133 mg/dL — ABNORMAL HIGH (ref 70–99)
Glucose-Capillary: 165 mg/dL — ABNORMAL HIGH (ref 70–99)
Glucose-Capillary: 120 mg/dL — ABNORMAL HIGH (ref 70–99)
Glucose-Capillary: 157 mg/dL — ABNORMAL HIGH (ref 70–99)

## 2022-02-02 LAB — CBC
HCT: 29.9 % — ABNORMAL LOW (ref 36.0–46.0)
Hemoglobin: 9.4 g/dL — ABNORMAL LOW (ref 12.0–15.0)
MCH: 26.4 pg (ref 26.0–34.0)
MCHC: 31.4 g/dL (ref 30.0–36.0)
MCV: 84 fL (ref 80.0–100.0)
Platelets: 327 10*3/uL (ref 150–400)
RBC: 3.56 MIL/uL — ABNORMAL LOW (ref 3.87–5.11)
RDW: 16.8 % — ABNORMAL HIGH (ref 11.5–15.5)
WBC: 13.3 10*3/uL — ABNORMAL HIGH (ref 4.0–10.5)
nRBC: 0 % (ref 0.0–0.2)

## 2022-02-02 LAB — BASIC METABOLIC PANEL
Anion gap: 9 (ref 5–15)
BUN: <5 mg/dL — ABNORMAL LOW (ref 6–20)
CO2: 28 mmol/L (ref 22–32)
Calcium: 8.8 mg/dL — ABNORMAL LOW (ref 8.9–10.3)
Chloride: 103 mmol/L (ref 98–111)
Creatinine, Ser: 0.69 mg/dL (ref 0.44–1.00)
GFR, Estimated: >60 mL/min (ref >60)
Glucose, Bld: 141 mg/dL — ABNORMAL HIGH (ref 70–99)
Potassium: 3.2 mmol/L — ABNORMAL LOW (ref 3.5–5.1)
Sodium: 140 mmol/L (ref 135–145)

## 2022-02-02 LAB — PHOSPHORUS: Phosphorus: 3.1 mg/dL (ref 2.5–4.6)

## 2022-02-02 LAB — MAGNESIUM: Magnesium: 2 mg/dL (ref 1.7–2.4)

## 2022-02-02 LAB — SURGICAL PATHOLOGY

## 2022-02-02 MED ORDER — DIPHENHYDRAMINE HCL 25 MG PO CAPS: 25.0000 mg | ORAL_CAPSULE | Freq: Four times a day (QID) | ORAL | Status: DC | PRN

## 2022-02-02 NOTE — Progress Notes (Signed)
GYN Oncology Progress Note  Patient resting in bed comfortably. She reports feeling better this afternoon and states she feels "more calm" compared to earlier today. Feels things shifting in bowels and "things feel softer" in her abdomen. Had nausea this afternoon but this has improved after meds. Has been moving around without difficulty. Voiding without issue. Pain is managed with scheduled medications. Itching has slightly improved and she feels this is related to dry skin.   Alert, oriented, in no acute distress. Bowel sounds more active than this am. Stoma remains pink with serosanguinous drainage. Drain with serosanguinous drainage.  Continue with plan of care per Dr. Berline Lopes. Will await CBC results in the am and continue with current IV antibiotic regimen at this time. No needs voiced per patient.

## 2022-02-02 NOTE — Consult Note (Addendum)
[place] Nurse ostomy follow up Stoma type/location: RLQ, loop ileostomy  Stomal assessment/size: 1 1/2" with red rubber support bridge in place  Peristomal assessment: NA Treatment options for stomal/peristomal skin: using 2" skin barrier ring Output bloody, no stool  Ostomy pouching: 2pc. 2 3/4" with 2" skin barrier ring, landscape of the abdomen does not allow for much space between midline wound and JP drain, may be able to convert her to 2 1/4" with next pouch change, however today she is nauseated, distended and not able to engage with learning at all. Discussed with surgery, bedside nurse, and TOC staff Education provided: attempted to engage and ask about questions she is slightly tachypnic while talking with me and reports nausea.  I have requested bedside nurse to administer meds for nausea.   Enrolled patient in [place] Start Discharge program: Yes Unclear of DC plans at this time, could benefit from additional teaching for sure, ostomy education and support by [facility] planned for next week.  4 pouches/skin barrier/barrier rings in the room for use  WOC Nurse will follow along with you for continued support with ostomy teaching and care [place] MSN, [place], [place], [place], [place]

## 2022-02-02 NOTE — Progress Notes (Signed)
Mobility Specialist - Progress Note   02/02/22 0907  Mobility  Activity Ambulated with assistance in hallway  Level of Assistance Minimal assist, patient does 75% or more  Assistive Device Other (Comment) (IV Pole)  Distance Ambulated (ft) 80 ft  Range of Motion/Exercises Active  Activity Response Tolerated well  Mobility Referral Yes  $Mobility charge 1 Mobility   Pt was found in bed and agreeable to ambulate. Still c/o abdominal pain but stated that it has gotten better. Was min-A to go from lying>sitting EOB and standing. During ambulation relied on the IV Pole for support. At EOS returned to recliner chair with necessities in reach.  Ferd Hibbs Mobility Specialist

## 2022-02-02 NOTE — TOC Initial Note (Signed)
Transition of Care [facility]) - Initial/Assessment Note    Patient Details  Name: Marie Hill MRN: [id number] Date of Birth: 03/09/1980  Transition of Care [facility]) CM/SW Contact:    Lennart Pall, LCSW Phone Number: 02/02/2022, 11:32 AM  Clinical Narrative:                 Met with pt today to review anticipated dc needs.  Pt had recently discharged home with IV abx, however, MD now anticipating she will not need this at discharge (likely change to p.o.).  Pt now with new colostomy so there is an order in for [facility] for oversight.  Have been able to secure [facility] coverage with [facility] but they will not be able to have a start of care until next Tuesday - MD aware.  TOC will continue to follow for any additional needs that may arise.  Planned Disposition: Home with Health Care Svc Barriers to Discharge: Continued Medical Work up   Patient Goals and CMS Choice Patient states their goals for this hospitalization and ongoing recovery are:: return home          Expected Discharge Plan and Services Planned Disposition: Home with Health Care Svc     Post Acute Care Choice: [place] arrangements for the past 2 months: Single Family Home                 DME Arranged: N/A DME Agency: NA       HH Arranged: RN [place] Agency: [place] Date [facility] Agency Contacted: 02/02/22 Time [place] Agency Contacted: 62 Representative spoke with at [place]: Tommi Rumps  Prior Living Arrangements/Services Living arrangements for the past 2 months: [place] Lives with:: Spouse Patient language and need for interpreter reviewed:: Yes Do you feel safe going back to the place where you live?: Yes      Need for Family Participation in Patient Care: Yes (Comment) Care giver support system in place?: Yes (comment)   Criminal Activity/Legal Involvement Pertinent to Current Situation/Hospitalization: No - Comment as needed  Activities of Daily Living Home Assistive Devices/Equipment: None ADL  Screening (condition at time of admission) Patient's cognitive ability adequate to safely complete daily activities?: Yes Is the patient deaf or have difficulty hearing?: No Does the patient have difficulty seeing, even when wearing glasses/contacts?: No Does the patient have difficulty concentrating, remembering, or making decisions?: No Patient able to express need for assistance with ADLs?: Yes Does the patient have difficulty dressing or bathing?: No Independently performs ADLs?: Yes (appropriate for developmental age) Does the patient have difficulty walking or climbing stairs?: No Weakness of Legs: None Weakness of Arms/Hands: None  Permission Sought/Granted                  Emotional Assessment Appearance:: Appears stated age Attitude/Demeanor/Rapport: Gracious Affect (typically observed): Accepting, Quiet Orientation: : Oriented to Self, Oriented to Place, Oriented to  Time, Oriented to Situation Alcohol / Substance Use: Not Applicable Psych Involvement: No (comment)  Admission diagnosis:  Tubo-ovarian abscess [N70.93] Sepsis, due to unspecified organism, unspecified whether acute organ dysfunction present [facility]) [A41.9] Post-operative state [Z98.890] Patient Active Problem List   Diagnosis Date Noted   Post-operative state 01/30/2022   Tubo-ovarian abscess 01/20/2022   Ovarian cystic mass 01/13/2022   Protein-calorie malnutrition, severe 12/30/2021   Sepsis ([place]) 12/26/2021   Mass of left axilla 07/04/2016   Miscarriage 06/30/2015   Endometriosis of ovary 11/02/2014   Infertility, female, 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 [place] [address] Phone: [phone] Fax: [phone]     Social Determinants of Health (SDOH) Social History: [place]: No Food Insecurity (01/28/2022)  Housing: Low Risk  (01/28/2022)  Transportation Needs: No Transportation Needs (01/28/2022)  Utilities: Not At Risk (01/28/2022)  Depression (PHQ2-9): Low Risk  (04/22/2020)  Tobacco Use: Low Risk  (01/31/2022)   SDOH Interventions:     Readmission Risk Interventions    02/02/2022   11:30 AM 01/20/2022    1:49 PM 12/28/2021    2:04 PM  Readmission Risk Prevention Plan  Post Dischage Appt  Complete Complete  Medication Screening  Complete Complete  Transportation Screening Complete Complete Complete  PCP or Specialist Appt within 5-7 Days Complete    Home Care Screening Complete    Medication Review (RN CM) Complete

## 2022-02-02 NOTE — Progress Notes (Signed)
Gynecologic Oncology Progress Note  41 y.o. with a known history of endometriosis who developed a TOA after outpatient REI procedure who had failed outpatient management of TOA with worsening symptoms at home while on IV zosyn.   Surgery performed on 01/30/2022: 3 Days Post-Op Procedure(s) (LRB): OPEN unilateral SALPINGO OOPHORECTOMY,  hysterectomy  rectosigmoid resection and ileostomy (N/A) CYSTOSCOPY WITH RETROGRADE PYELOGRAM/URETERAL STENT PLACEMENT (Right)  Subjective: Patient reports itching all over. No chest pain,shortness of breath, feeling of throat swelling. Pain is controlled. No fever or chills. No nausea or emesis. Feels steady. No dizziness. Feels when she eats it stops in the upper abdomen and creates discomfort. Appetite improving with feeling hungry sometimes. No concerns voiced.  Objective: Vital signs in last 24 hours: Temp:  [98 F (36.7 C)-98.6 F (37 C)] 98.2 F (36.8 C) (12/21 0450) Pulse Rate:  [93-100] 100 (12/21 0450) Resp:  [16-18] 16 (12/21 0450) BP: (119-150)/(87-96) 148/93 (12/21 0450) SpO2:  [98 %-99 %] 98 % (12/21 0450) Last BM Date : 01/29/22  Intake/Output from previous day: 12/20 0701 - 12/21 0700 In: 2628.4 [P.O.:1440; I.V.:849.9; IV Piggyback:338.5] Out: 2945 [Urine:2900; Drains:45]  Physical Examination (performed by Dr. Berline Lopes): General: alert, cooperative, no distress, and appears tired Resp: clear to auscultation bilaterally Cardio: regular rate and rhythm, S1, S2 normal, no murmur, click, rub or gallop GI: incision: midline incision with op site dressing in place with no drainage noted underneath and abdomen soft, mildly hypoactive bowel sounds, ileostomy with bag intact, stoma pink with rubber support bridge present, bloody output Extremities: extremities normal, atraumatic, no cyanosis or edema 42 cc of serosanguinous drainage emptied from drain. 400 cc of clear, yellow urine emptied from measuring hat as well.    Labs: WBC/Hgb/Hct/Plts:  13.3/9.4/29.9/327 (12/21 0403) BUN/Cr/glu/ALT/AST/amyl/lip:  <5/0.69/--/--/--/--/-- (12/21 0403)  Assessment: 41 y.o. with a known history of endometriosis who developed a TOA after outpatient REI procedure who had failed outpatient management of TOA with worsening symptoms at home while on IV zosyn. S/P OPEN unilateral SALPINGO OOPHORECTOMY,  hysterectomy  rectosigmoid resection and ileostomy (N/A) CYSTOSCOPY WITH RETROGRADE PYELOGRAM/URETERAL STENT PLACEMENT (Right) on 01/30/2022.  Pain:  Abdominal soreness improving with scheduled pain medication. PRN medications ordered if needed.  Heme: Hgb 9.4 from 9.3 yesterday and Hct 29.9 this am-stable. Given 2 units PRBCs intra-op. EBL 800.  ID: WBC count 13.3 this am from 15.5 yesterday. Continue to monitor.   CV: BP and HR stable. Continue to monitor with ordered vital signs.  GI:  Tolerating po: yes, small amounts. Diet as tolerated. S/p ileostomy with rectosigmoid resection. Ileostomy with bloody output.   GU: Creatinine 0.69 from 0.44 yesterday am. Right ureter stent removed intra-op. Adequate output reported.   FEN: No critical values on am labs. K+ 3.2 this am. Magnesium replaced 12/19 and currently 2.0 this am.   Prophylaxis: SCDs. Lovenox ordered.  Plan: Encourage increasing mobility and increasing oral intake as tolerated Benadryl ordered for itching Ileostomy teaching to continue Continue current regimen of IV antibiotics per Dr. Berline Lopes. WBC count decreased to 13.3 Am labs  Continue plan of care per Dr. Berline Lopes Expect patient to be inpatient up until Friday, subject to change based on medical status and progression   LOS: 5 days    Marie Hill 02/02/2022, 7:23 AM

## 2022-02-03 ENCOUNTER — Inpatient Hospital Stay (HOSPITAL_COMMUNITY): Payer: Commercial Managed Care - HMO

## 2022-02-03 DIAGNOSIS — K567 Ileus, unspecified: Principal | ICD-10-CM

## 2022-02-03 LAB — GLUCOSE, CAPILLARY
Glucose-Capillary: 171 mg/dL — ABNORMAL HIGH (ref 70–99)
Glucose-Capillary: 177 mg/dL — ABNORMAL HIGH (ref 70–99)
Glucose-Capillary: 209 mg/dL — ABNORMAL HIGH (ref 70–99)
Glucose-Capillary: 174 mg/dL — ABNORMAL HIGH (ref 70–99)

## 2022-02-03 LAB — BASIC METABOLIC PANEL
Anion gap: 11 (ref 5–15)
Anion gap: 8 (ref 5–15)
BUN: 7 mg/dL (ref 6–20)
BUN: 8 mg/dL (ref 6–20)
CO2: 25 mmol/L (ref 22–32)
CO2: 25 mmol/L (ref 22–32)
Calcium: 8.9 mg/dL (ref 8.9–10.3)
Calcium: 8.5 mg/dL — ABNORMAL LOW (ref 8.9–10.3)
Chloride: 99 mmol/L (ref 98–111)
Chloride: 104 mmol/L (ref 98–111)
Creatinine, Ser: 0.79 mg/dL (ref 0.44–1.00)
Creatinine, Ser: 0.81 mg/dL (ref 0.44–1.00)
GFR, Estimated: >60 mL/min (ref >60)
GFR, Estimated: >60 mL/min (ref >60)
Glucose, Bld: 176 mg/dL — ABNORMAL HIGH (ref 70–99)
Glucose, Bld: 203 mg/dL — ABNORMAL HIGH (ref 70–99)
Potassium: 2.9 mmol/L — ABNORMAL LOW (ref 3.5–5.1)
Potassium: 3.4 mmol/L — ABNORMAL LOW (ref 3.5–5.1)
Sodium: 135 mmol/L (ref 135–145)
Sodium: 137 mmol/L (ref 135–145)

## 2022-02-03 LAB — CBC
HCT: 33.9 % — ABNORMAL LOW (ref 36.0–46.0)
Hemoglobin: 10.7 g/dL — ABNORMAL LOW (ref 12.0–15.0)
MCH: 26.4 pg (ref 26.0–34.0)
MCHC: 31.6 g/dL (ref 30.0–36.0)
MCV: 83.5 fL (ref 80.0–100.0)
Platelets: 399 10*3/uL (ref 150–400)
RBC: 4.06 MIL/uL (ref 3.87–5.11)
RDW: 16.4 % — ABNORMAL HIGH (ref 11.5–15.5)
WBC: 13.9 10*3/uL — ABNORMAL HIGH (ref 4.0–10.5)
nRBC: 0 % (ref 0.0–0.2)

## 2022-02-03 LAB — PHOSPHORUS: Phosphorus: 4.9 mg/dL — ABNORMAL HIGH (ref 2.5–4.6)

## 2022-02-03 LAB — MAGNESIUM: Magnesium: 1.8 mg/dL (ref 1.7–2.4)

## 2022-02-03 MED ORDER — PANTOPRAZOLE SODIUM 40 MG IV SOLR
40.0000 mg | INTRAVENOUS | Status: DC
  Administered 2022-02-04 – 2022-02-05 (×2): 40 mg via INTRAVENOUS
  Filled 2022-02-03 (×2): qty 10

## 2022-02-03 MED ORDER — LIP MEDEX EX OINT
1.0000 | TOPICAL_OINTMENT | CUTANEOUS | Status: DC | PRN
  Administered 2022-02-03: 1 via TOPICAL

## 2022-02-03 MED ORDER — INSULIN ASPART 100 UNIT/ML IJ SOLN
0.0000 [IU] | INTRAMUSCULAR | Status: DC
  Administered 2022-02-03 – 2022-02-04 (×3): 3 [IU] via SUBCUTANEOUS
  Administered 2022-02-04: 1 [IU] via SUBCUTANEOUS
  Administered 2022-02-04: 2 [IU] via SUBCUTANEOUS
  Administered 2022-02-04 (×2): 1 [IU] via SUBCUTANEOUS
  Administered 2022-02-04 – 2022-02-05 (×2): 2 [IU] via SUBCUTANEOUS
  Administered 2022-02-05: 1 [IU] via SUBCUTANEOUS
  Administered 2022-02-05 (×4): 2 [IU] via SUBCUTANEOUS
  Administered 2022-02-06: 1 [IU] via SUBCUTANEOUS
  Administered 2022-02-06 (×2): 2 [IU] via SUBCUTANEOUS
  Administered 2022-02-06: 1 [IU] via SUBCUTANEOUS
  Administered 2022-02-06 – 2022-02-07 (×4): 2 [IU] via SUBCUTANEOUS
  Administered 2022-02-07: 1 [IU] via SUBCUTANEOUS
  Administered 2022-02-07: 3 [IU] via SUBCUTANEOUS
  Administered 2022-02-08: 2 [IU] via SUBCUTANEOUS
  Administered 2022-02-08: 1 [IU] via SUBCUTANEOUS
  Administered 2022-02-08: 2 [IU] via SUBCUTANEOUS
  Administered 2022-02-08: 3 [IU] via SUBCUTANEOUS
  Administered 2022-02-08: 2 [IU] via SUBCUTANEOUS
  Administered 2022-02-09: 1 [IU] via SUBCUTANEOUS
  Administered 2022-02-09: 2 [IU] via SUBCUTANEOUS
  Administered 2022-02-09 (×3): 1 [IU] via SUBCUTANEOUS

## 2022-02-03 MED ORDER — HYDROMORPHONE HCL 1 MG/ML IJ SOLN
0.5000 mg | INTRAMUSCULAR | Status: DC | PRN
  Administered 2022-02-03 – 2022-02-11 (×14): 0.5 mg via INTRAVENOUS
  Filled 2022-02-03 (×15): qty 0.5

## 2022-02-03 MED ORDER — LORAZEPAM 2 MG/ML IJ SOLN
0.5000 mg | Freq: Four times a day (QID) | INTRAMUSCULAR | Status: DC | PRN
  Administered 2022-02-12: 0.5 mg via INTRAVENOUS
  Filled 2022-02-03: qty 1

## 2022-02-03 MED ORDER — POTASSIUM CHLORIDE 10 MEQ/50ML IV SOLN
10.0000 meq | INTRAVENOUS | Status: AC
  Administered 2022-02-03 (×2): 10 meq via INTRAVENOUS
  Filled 2022-02-03 (×2): qty 50

## 2022-02-03 MED ORDER — KCL IN DEXTROSE-NACL 20-5-0.45 MEQ/L-%-% IV SOLN
INTRAVENOUS | Status: DC
  Filled 2022-02-03 (×11): qty 1000

## 2022-02-03 MED ORDER — PHENOL 1.4 % MT LIQD
1.0000 | OROMUCOSAL | Status: DC | PRN
  Administered 2022-02-04: 1 via OROMUCOSAL
  Filled 2022-02-03: qty 177

## 2022-02-03 MED ORDER — DIPHENHYDRAMINE HCL 50 MG/ML IJ SOLN: 12.5000 mg | Freq: Four times a day (QID) | INTRAMUSCULAR | Status: DC | PRN

## 2022-02-03 NOTE — Progress Notes (Signed)
Attempted NG tube twice but patient grabbed primary RN arm twice stating that she cannot do it.

## 2022-02-03 NOTE — Consult Note (Signed)
[place] Nurse ostomy follow up Stoma type/location: RLQ ileostomy Stomal assessment/size: 1 and 1/2 inches round, red, above skin level, ostomy bridge (red rubber catheter) in place. Peristomal assessment: Intact clear Treatment options for stomal/peristomal skin: skin barrier ring Output: dark green effluent  Ostomy pouching: 2pc. 2 and 3/4 inch pouching system with skin barrier ring, aperture is cut off-center due to small body habitus and little space between medial stomal margin and midline incision. Education provided: None today. NGT was just reinserted for ileus (xray has not yet been done) and insertion was traumatic for patient. She is not able to engage in ostomy education today. She is amenable to resuming education on Tuesday. She is appreciative of visit. Enrolled patient in [place] Start Discharge program: Yes  [place] nursing team will follow, and will remain available to this patient, the nursing, surgical and medical teams.    Supplies at bedside (in cupboard over sink) for teaching sessions. Next panned visit is TUESDAY, 12/26.  Thank you for inviting [place] to participate in this patient's Plan of Care.  Maudie Flakes, MSN, RN, CNS, [place], Serita Grammes, [employer], [employer] phone:  [phone]

## 2022-02-03 NOTE — Plan of Care (Signed)
  Problem: Pain Managment: Goal: General experience of comfort will improve Outcome: Progressing   Problem: Safety: Goal: Ability to remain free from injury will improve Outcome: Progressing   Problem: Skin Integrity: Goal: Risk for impaired skin integrity will decrease Outcome: Progressing   

## 2022-02-03 NOTE — Progress Notes (Signed)
Gynecologic Oncology Progress Note  41 y.o. with a known history of endometriosis who developed a TOA after outpatient REI procedure who had failed outpatient management of TOA with worsening symptoms at home while on IV zosyn.   Surgery performed on 01/30/2022: 4 Days Post-Op Procedure(s) (LRB): OPEN unilateral SALPINGO OOPHORECTOMY,  hysterectomy  rectosigmoid resection and ileostomy (N/A) CYSTOSCOPY WITH RETROGRADE PYELOGRAM/URETERAL STENT PLACEMENT (Right)  Subjective: Patient reports feeling bad this am. Increased abdominal pain after vomiting. Had some flatus passed in the ostomy bag during emesis episode. Having nausea. Feels very tired. Had three large episodes of emesis overnight. Has been out of bed. Voiding without difficulty. No concerns voiced.  Objective: Vital signs in last 24 hours: Temp:  [98 F (36.7 C)-98.6 F (37 C)] 98 F (36.7 C) (12/22 0525) Pulse Rate:  [89-95] 91 (12/22 0525) Resp:  [16-18] 16 (12/22 0525) BP: (139-158)/(100-113) 158/113 (12/22 0525) SpO2:  [96 %-100 %] 98 % (12/22 0525) Last BM Date : 01/29/22  Intake/Output from previous day: 12/21 0701 - 12/22 0700 In: 1312.7 [P.O.:360; I.V.:593.7; IV Piggyback:358.9] Out: 2060 [Urine:1700; Drains:340; Stool:20]  Physical Examination (performed by Dr. Berline Lopes): General: alert, cooperative, no distress, and appears tired Resp: clear to auscultation bilaterally Cardio: regular rate and rhythm, S1, S2 normal, no murmur, click, rub or gallop GI: incision: midline incision with op site dressing in place with no drainage noted underneath and abdomen soft, mildly hypoactive bowel sounds- more active than yesterday, tympanic on percussion, ileostomy with bag intact, stoma pink with rubber support bridge present-starting to have liquid stool Extremities: extremities normal, atraumatic, no cyanosis or edema  Labs: WBC/Hgb/Hct/Plts:  13.9/10.7/33.9/399 (12/22 0356) BUN/Cr/glu/ALT/AST/amyl/lip:   7/0.79/--/--/--/--/-- (12/22 8502)  Assessment: 41 y.o. with a known history of endometriosis who developed a TOA after outpatient REI procedure who had failed outpatient management of TOA with worsening symptoms at home while on IV zosyn. S/P OPEN unilateral SALPINGO OOPHORECTOMY,  hysterectomy  rectosigmoid resection and ileostomy (N/A) CYSTOSCOPY WITH RETROGRADE PYELOGRAM/URETERAL STENT PLACEMENT (Right) on 01/30/2022.  Pain:  Abdominal soreness after episodes of emesis. On scheduled pain medication and feels this is helping.  Heme: Hgb 10.7 from 9.4 yesterday and Hct 33.9 this am-stable. Given 2 units PRBCs intra-op. EBL 800.  ID: WBC count 13.9 from 13.3 yesterday. Currently on vancomycin and zosyn IV.  CV: BP and HR stable. Continue to monitor with ordered vital signs.  GI:  Tolerating po: no, nausea with emesis. Antiemetics ordered as needed. S/p ileostomy with rectosigmoid resection. Ileostomy with bloody output.   GU: Creatinine 0.79 from 0.69 yesterday am. IVF increased given dehydration. Right ureter stent removed intra-op. Adequate output reported.   FEN: No critical values on am labs. K+ 2.9 this am. Magnesium replaced 12/19 and currently 1.8 this am.   Prophylaxis: SCDs. Lovenox ordered.  Plan: Replace potassium. Plan for repeat lab this afternoon IVF at 125 cc/hr Place NG tube if nausea persists/worsens and for emesis Ileostomy teaching to continue Continue current regimen of IV antibiotics per Dr. Berline Lopes. WBC count slightly increased to 13.9  Am labs  Continue plan of care per Dr. Berline Lopes    LOS: 6 days    Marie Hill 02/03/2022, 7:08 AM

## 2022-02-04 LAB — GLUCOSE, CAPILLARY
Glucose-Capillary: 134 mg/dL — ABNORMAL HIGH (ref 70–99)
Glucose-Capillary: 136 mg/dL — ABNORMAL HIGH (ref 70–99)
Glucose-Capillary: 149 mg/dL — ABNORMAL HIGH (ref 70–99)
Glucose-Capillary: 156 mg/dL — ABNORMAL HIGH (ref 70–99)
Glucose-Capillary: 198 mg/dL — ABNORMAL HIGH (ref 70–99)
Glucose-Capillary: 205 mg/dL — ABNORMAL HIGH (ref 70–99)

## 2022-02-04 LAB — MAGNESIUM: Magnesium: 1.9 mg/dL (ref 1.7–2.4)

## 2022-02-04 LAB — LACTIC ACID, PLASMA: Lactic Acid, Venous: 0.6 mmol/L (ref 0.5–1.9)

## 2022-02-04 LAB — BASIC METABOLIC PANEL
Anion gap: 10 (ref 5–15)
BUN: 9 mg/dL (ref 6–20)
CO2: 25 mmol/L (ref 22–32)
Calcium: 8.7 mg/dL — ABNORMAL LOW (ref 8.9–10.3)
Chloride: 103 mmol/L (ref 98–111)
Creatinine, Ser: 0.95 mg/dL (ref 0.44–1.00)
GFR, Estimated: >60 mL/min (ref >60)
Glucose, Bld: 155 mg/dL — ABNORMAL HIGH (ref 70–99)
Potassium: 3.4 mmol/L — ABNORMAL LOW (ref 3.5–5.1)
Sodium: 138 mmol/L (ref 135–145)

## 2022-02-04 LAB — CBC
HCT: 30.4 % — ABNORMAL LOW (ref 36.0–46.0)
Hemoglobin: 9.5 g/dL — ABNORMAL LOW (ref 12.0–15.0)
MCH: 26.2 pg (ref 26.0–34.0)
MCHC: 31.3 g/dL (ref 30.0–36.0)
MCV: 83.7 fL (ref 80.0–100.0)
Platelets: 407 10*3/uL — ABNORMAL HIGH (ref 150–400)
RBC: 3.63 MIL/uL — ABNORMAL LOW (ref 3.87–5.11)
RDW: 16.3 % — ABNORMAL HIGH (ref 11.5–15.5)
WBC: 12.9 10*3/uL — ABNORMAL HIGH (ref 4.0–10.5)
nRBC: 0 % (ref 0.0–0.2)

## 2022-02-04 LAB — PHOSPHORUS: Phosphorus: 3.7 mg/dL (ref 2.5–4.6)

## 2022-02-04 MED ORDER — HYDROMORPHONE HCL 1 MG/ML IJ SOLN
0.5000 mg | INTRAMUSCULAR | Status: DC
  Administered 2022-02-04 – 2022-02-06 (×26): 0.5 mg via INTRAVENOUS
  Filled 2022-02-04 (×26): qty 0.5

## 2022-02-04 MED ORDER — ACYCLOVIR 5 % EX OINT
TOPICAL_OINTMENT | Freq: Four times a day (QID) | CUTANEOUS | Status: DC
  Administered 2022-02-12 – 2022-02-18 (×5): 1 via TOPICAL
  Filled 2022-02-04 (×3): qty 15

## 2022-02-04 NOTE — Progress Notes (Signed)
5 Days Post-Op Procedure(s) (LRB): OPEN unilateral SALPINGO OOPHORECTOMY,  hysterectomy  rectosigmoid resection and ileostomy (N/A) CYSTOSCOPY WITH RETROGRADE PYELOGRAM/URETERAL STENT PLACEMENT (Right)  Subjective: Patient reports feeling similar to yesterday. Having some abdominal pain. No nausea with NGT in.   Objective: Vital signs in last 24 hours: Temp:  [97.9 F (36.6 C)-98.3 F (36.8 C)] 97.9 F (36.6 C) (12/23 0403) Pulse Rate:  [92-104] 96 (12/23 0403) Resp:  [18] 18 (12/23 0403) BP: (173-183)/(107-110) 173/107 (12/23 0403) SpO2:  [97 %-98 %] 97 % (12/23 0403) Last BM Date : 01/29/22  Intake/Output from previous day: 12/22 0701 - 12/23 0700 In: 2164 [I.V.:1828.8; IV Piggyback:335.3] Out: 1275 [Urine:1000; Emesis/NG output:125; Drains:70; Stool:80]  Physical Examination: General: Alert, oriented, no acute distress.  HEENT: Normocephalic, atraumatic. Sclera anicteric.  Chest: Clear to auscultation bilaterally. No wheezes, rhonchi, or rales. Cardiovascular: HR in 90s, regular rhythm, no murmurs, rubs, or gallops.  Abdomen: soft, moderately distended and tympanitic, hypoactive BS, mildly tender to palpation in lower abdomen. Incision is c/d/I with staples in place. Ostomy bag with approximately 15 cc of liquid stool in bag, stoma pink and viable.  Right lower quadrant drain stripped, minimal serous fluid in the bulb. Extremities: Grossly normal range of motion. Warm, well perfused. No edema bilaterally.   Labs: WBC/Hgb/Hct/Plts:  12.9/9.5/30.4/407 (12/23 0340) BUN/Cr/glu/ALT/AST/amyl/lip:  9/0.95/--/--/--/--/-- (12/23 0340)   Assessment:  41 y.o. s/p Procedure(s): OPEN unilateral SALPINGO OOPHORECTOMY,  hysterectomy  rectosigmoid resection and ileostomy CYSTOSCOPY WITH RETROGRADE PYELOGRAM/URETERAL STENT PLACEMENT: now with postoperative ileus, poorly controlled pain.  Post-op: Slow recovery as anticipated from large surgery with weeks of intra-abdominal infection.   Voiding freely.  Has had return of bowel function although now with postoperative ileus.  NG tube placed yesterday with low output, improvement in nausea.  X-ray showing multiple dilated loops of small bowel.  For pain control since NG tube placed and not getting p.o. medications.  Will order scheduled Dilaudid as well as as needed Dilaudid.  If still struggling with pain later today, can start PCA.  Once NG tube able to be clamped for shorter periods of time, can reinitiate oral pain medication. She is at risk of having breakdown of her colonic reanastomosis.  If not clinically improving by tomorrow, will consider CT of the abdomen and pelvis with placement of water-soluble contrast into the distal limb of her ostomy.  Hypertension: Suspected to be in the setting of poorly controlled pain.  Scheduling pain medication with plan to start PCA if continued poor pain control.  Ileus: Patient with slow return of bowel function, distended and still hypoactive bowel sounds.  X-ray yesterday showed dilated loops of small bowel.  Continues to have output from ostomy, now with NG tube in place.  Once better bowel sounds, improved output, will plan clamp trial.  ID: Continue antibiotics at this time.  Slow improvement in leukocytosis although continues on broad-spectrum antibiotics.  Will reassess daily for stopping antibiotics.  Patient has now completed 8 days of therapy.  Endo: Continue sliding scale for hyperglycemia in the setting of her diabetes.  She is now n.p.o. with dextrose and her IV fluids.  Anticipate that blood sugars may run a little bit higher.  Prophylaxis: Continue SCDs while in bed, prophylactic Lovenox.  Plan: Dispo:  Discharge plan to include : home health The patient is to be discharged to home. Anticipate discharge next week.   LOS: 7 days    Lafonda Mosses 02/04/2022, 8:04 AM

## 2022-02-04 NOTE — Progress Notes (Signed)
Pharmacy Antibiotic Note  Marie Hill is a 41 y.o. female with hx endometriosis  who developed tubo-ovarian abscess (TOA) after outpatient REI procedure (transvaginal endometrioma drainage) who presented to the ED on 01/27/2022 with fever. On 01/30/22, she underwent cystoscopy with right ureteral stent placement, unilateral salpingo oophorectomy, hysterectomy rectosigmoid resection and ileostomy. She's currently on vancomycin and zosyn for infection.  Today, 02/04/2022: - day #8 abx - afeb, wbc 12.9 - scr 0.95 (crcl~60)  Plan: - continue vancomycin 1000 mg q24h and zosyn 3.375 gm IV q8h (infuse over 4 hrs) - check vancomycin peak and trough level with the dose tonight   ______________________________________  Height: 5' 1.81" (157 cm) Weight: 50 kg (110 lb 3.7 oz) IBW/kg (Calculated) : 49.67  Temp (24hrs), Avg:98.1 F (36.7 C), Min:97.9 F (36.6 C), Max:98.3 F (36.8 C)  Recent Labs  Lab 01/31/22 0419 02/01/22 0308 02/02/22 0403 02/03/22 0356 02/03/22 1652 02/04/22 0340  WBC 13.6* 15.5* 13.3* 13.9*  --  12.9*  CREATININE 0.53 0.44 0.69 0.79 0.81 0.95    Estimated Creatinine Clearance: 60.5 mL/min (by C-G formula based on SCr of 0.95 mg/dL).    No Known Allergies  12/15 vanc>>  12/15 cefepime>>12/17 12/17 Zosyn >>   12/15 bcx x2: neg FINAL  Thank you for allowing pharmacy to be a part of this patient's care.  Lynelle Doctor 02/04/2022 6:37 AM

## 2022-02-05 ENCOUNTER — Inpatient Hospital Stay (HOSPITAL_COMMUNITY): Payer: Commercial Managed Care - HMO

## 2022-02-05 LAB — CBC
HCT: 29.7 % — ABNORMAL LOW (ref 36.0–46.0)
Hemoglobin: 9.3 g/dL — ABNORMAL LOW (ref 12.0–15.0)
MCH: 26.6 pg (ref 26.0–34.0)
MCHC: 31.3 g/dL (ref 30.0–36.0)
MCV: 85.1 fL (ref 80.0–100.0)
Platelets: 381 10*3/uL (ref 150–400)
RBC: 3.49 MIL/uL — ABNORMAL LOW (ref 3.87–5.11)
RDW: 16.5 % — ABNORMAL HIGH (ref 11.5–15.5)
WBC: 10.6 10*3/uL — ABNORMAL HIGH (ref 4.0–10.5)
nRBC: 0 % (ref 0.0–0.2)

## 2022-02-05 LAB — CREATININE, FLUID (PLEURAL, PERITONEAL, JP DRAINAGE): Creat, Fluid: 1.2 mg/dL

## 2022-02-05 LAB — BASIC METABOLIC PANEL
Anion gap: 6 (ref 5–15)
BUN: 9 mg/dL (ref 6–20)
CO2: 24 mmol/L (ref 22–32)
Calcium: 8.3 mg/dL — ABNORMAL LOW (ref 8.9–10.3)
Chloride: 108 mmol/L (ref 98–111)
Creatinine, Ser: 1.12 mg/dL — ABNORMAL HIGH (ref 0.44–1.00)
GFR, Estimated: >60 mL/min (ref >60)
Glucose, Bld: 160 mg/dL — ABNORMAL HIGH (ref 70–99)
Potassium: 3.3 mmol/L — ABNORMAL LOW (ref 3.5–5.1)
Sodium: 138 mmol/L (ref 135–145)

## 2022-02-05 LAB — GLUCOSE, CAPILLARY
Glucose-Capillary: 139 mg/dL — ABNORMAL HIGH (ref 70–99)
Glucose-Capillary: 157 mg/dL — ABNORMAL HIGH (ref 70–99)
Glucose-Capillary: 158 mg/dL — ABNORMAL HIGH (ref 70–99)
Glucose-Capillary: 170 mg/dL — ABNORMAL HIGH (ref 70–99)
Glucose-Capillary: 172 mg/dL — ABNORMAL HIGH (ref 70–99)
Glucose-Capillary: 185 mg/dL — ABNORMAL HIGH (ref 70–99)

## 2022-02-05 LAB — MAGNESIUM: Magnesium: 1.8 mg/dL (ref 1.7–2.4)

## 2022-02-05 LAB — OCCULT BLOOD GASTRIC / DUODENUM (SPECIMEN CUP)
Occult Blood, Gastric: POSITIVE — AB
pH, Gastric: 3

## 2022-02-05 LAB — VANCOMYCIN, PEAK: Vancomycin Pk: 40 ug/mL (ref 30–40)

## 2022-02-05 LAB — LACTIC ACID, PLASMA: Lactic Acid, Venous: 1 mmol/L (ref 0.5–1.9)

## 2022-02-05 LAB — PHOSPHORUS: Phosphorus: 3.6 mg/dL (ref 2.5–4.6)

## 2022-02-05 MED ORDER — PANTOPRAZOLE SODIUM 40 MG IV SOLR
40.0000 mg | Freq: Two times a day (BID) | INTRAVENOUS | Status: DC
  Administered 2022-02-05 – 2022-02-06 (×2): 40 mg via INTRAVENOUS
  Filled 2022-02-05 (×2): qty 10

## 2022-02-05 MED ORDER — STERILE WATER FOR INJECTION IJ SOLN
INTRAMUSCULAR | Status: AC
  Filled 2022-02-05: qty 50

## 2022-02-05 MED ORDER — IOHEXOL 300 MG/ML  SOLN
100.0000 mL | Freq: Once | INTRAMUSCULAR | Status: AC | PRN
  Administered 2022-02-05: 100 mL via INTRAVENOUS

## 2022-02-05 NOTE — Progress Notes (Signed)
6 Days Post-Op Procedure(s) (LRB): OPEN unilateral SALPINGO OOPHORECTOMY,  hysterectomy  rectosigmoid resection and ileostomy (N/A) CYSTOSCOPY WITH RETROGRADE PYELOGRAM/URETERAL STENT PLACEMENT (Right)  Subjective: Patient reports doing well, feeling much better than she was yesterday. Pain controlled with scheduled IV meds. Denies nausea. + ostomy output. Voiding without issues. Ambulated multiple times yesterday.  Objective: Vital signs in last 24 hours: Temp:  [97.6 F (36.4 C)] 97.6 F (36.4 C) (12/24 0408) Pulse Rate:  [90-97] 90 (12/24 0408) Resp:  [16-18] 16 (12/24 0408) BP: (144-194)/(89-117) 144/89 (12/24 0408) SpO2:  [97 %-99 %] 99 % (12/24 0408) Last BM Date : 01/29/22  Intake/Output from previous day: 12/23 0701 - 12/24 0700 In: 3681.7 [I.V.:3365; IV Piggyback:316.7] Out: 3445 [Urine:2850; Emesis/NG output:250; Drains:185; Stool:160]  Physical Examination: General: Alert, oriented, no acute distress.  HEENT: Normocephalic, atraumatic. Sclera anicteric.  Chest: Clear to auscultation bilaterally. No wheezes, rhonchi, or rales. Cardiovascular: regular rate and rhythm, no murmurs, rubs, or gallops.  Abdomen: soft, mild-moderately distended improved from yesterday, improved BS, nontender to palpation. Incision is c/d/I with staples in place. Ostomy bag with approximately 30 cc of liquid stool in bag, stoma pink and viable.  Right lower quadrant drain stripped, 70cc of serous fluid emptied from bulb.  Extremities: Grossly normal range of motion. Warm, well perfused. No edema bilaterally.   Labs: WBC/Hgb/Hct/Plts:  10.6/9.3/29.7/381 (12/24 0201) BUN/Cr/glu/ALT/AST/amyl/lip:  9/1.12/--/--/--/--/-- (12/24 0201)   Assessment:  41 y.o. s/p Procedure(s): OPEN unilateral SALPINGO OOPHORECTOMY,  hysterectomy  rectosigmoid resection and ileostomy CYSTOSCOPY WITH RETROGRADE PYELOGRAM/URETERAL STENT PLACEMENT: post-operative ileus resolving.   Post-op: Slow recovery as  anticipated from large surgery with weeks of intra-abdominal infection.  Voiding freely.  Has had return of bowel function initially, then developed post-op ileus.  NG tube with minimal output since placement. Patient feels significantly better and less distended with continued ostomy output. Pain much better controlled with scheduled medications.  Given increased drain output (still serous appearing), plan to send fluid for creatinine and will perform CT A/P with water-soluble contrast placed in distal limb of her ostomy to evaluate anastomosis.    Hypertension: Suspected to be in the setting of poorly controlled pain.  Improved with improved pain control.   Ileus: Resolving. Pending CT results, will perform clamp trial today. Change in color of NG output overnight - will send specimen for occult blood test.  AKI: suspected to be related to Zosyn/vanc therapy. Vanc discontinued today. Will also send creatinine level on JP fluid - low concern for ureteral or bladder injury but given increased drain output, will r/o urinary tract injury.   ID: Vancomycin discontinued today given AKI. Continue Zosyn at this time.  Slow improvement in leukocytosis.  Will continue to reassess daily for stopping antibiotics.  Patient has now completed 9 days of therapy.   Endo: Continue sliding scale for hyperglycemia in the setting of her diabetes.  She is now n.p.o. with dextrose and her IV fluids.  Anticipate that blood sugars may run a little bit higher.   Prophylaxis: Continue SCDs while in bed, prophylactic Lovenox.  Plan: Dispo:  Discharge plan to include : home health The patient is to be discharged to home. Anticipate discharge next week.   LOS: 8 days    Marie Hill 02/05/2022, 8:36 AM

## 2022-02-05 NOTE — Progress Notes (Signed)
Spoke with patient to update her.  CT A/P and delayed pelvic images after injection of contrast into the distal limb of the ileostomy - contrast has not reached descending colon. There is a small fluid collection (3 cm) adjacent to the anastamosis site in the pelvis. This is concerning for the possibility of a small leak but not diagnostic. Findings also of dilated small bowel c/w ileus vs SBO. Given continued ostomy output, this would strongly favor postoperative ileus vs pSBO. If ostomy decreases or patient clinically worsens, will plan on small bowel follow through.  I also spoke with GI regarding + occult blood on NG specimen. Patient with stable H&H. Fluid blood-tinged, not frankly bloody. Will increase PPI to BID dosing and continue to hold any NSAIDs. The patient is up and ambulating more, lovenox discontinued.   The patient voiced understanding of all of the above. She is continuing to feel better and has ambulated multiple times today. Denies any nausea. Discussed plan to leave NGT in place at this time and she is amenable.  Jeral Pinch MD Gynecologic Oncology

## 2022-02-06 LAB — BASIC METABOLIC PANEL
Anion gap: 10 (ref 5–15)
Anion gap: 8 (ref 5–15)
BUN: 5 mg/dL — ABNORMAL LOW (ref 6–20)
BUN: 5 mg/dL — ABNORMAL LOW (ref 6–20)
CO2: 25 mmol/L (ref 22–32)
CO2: 26 mmol/L (ref 22–32)
Calcium: 8.7 mg/dL — ABNORMAL LOW (ref 8.9–10.3)
Calcium: 8.7 mg/dL — ABNORMAL LOW (ref 8.9–10.3)
Chloride: 106 mmol/L (ref 98–111)
Chloride: 108 mmol/L (ref 98–111)
Creatinine, Ser: 0.93 mg/dL (ref 0.44–1.00)
Creatinine, Ser: 1.01 mg/dL — ABNORMAL HIGH (ref 0.44–1.00)
GFR, Estimated: >60 mL/min (ref >60)
GFR, Estimated: >60 mL/min (ref >60)
Glucose, Bld: 152 mg/dL — ABNORMAL HIGH (ref 70–99)
Glucose, Bld: 120 mg/dL — ABNORMAL HIGH (ref 70–99)
Potassium: 2.7 mmol/L — CL (ref 3.5–5.1)
Potassium: 2.9 mmol/L — ABNORMAL LOW (ref 3.5–5.1)
Sodium: 141 mmol/L (ref 135–145)
Sodium: 142 mmol/L (ref 135–145)

## 2022-02-06 LAB — CBC
HCT: 28.1 % — ABNORMAL LOW (ref 36.0–46.0)
Hemoglobin: 8.8 g/dL — ABNORMAL LOW (ref 12.0–15.0)
MCH: 26.5 pg (ref 26.0–34.0)
MCHC: 31.3 g/dL (ref 30.0–36.0)
MCV: 84.6 fL (ref 80.0–100.0)
Platelets: 386 10*3/uL (ref 150–400)
RBC: 3.32 MIL/uL — ABNORMAL LOW (ref 3.87–5.11)
RDW: 16.5 % — ABNORMAL HIGH (ref 11.5–15.5)
WBC: 9.8 10*3/uL (ref 4.0–10.5)
nRBC: 0 % (ref 0.0–0.2)

## 2022-02-06 LAB — GLUCOSE, CAPILLARY
Glucose-Capillary: 114 mg/dL — ABNORMAL HIGH (ref 70–99)
Glucose-Capillary: 127 mg/dL — ABNORMAL HIGH (ref 70–99)
Glucose-Capillary: 131 mg/dL — ABNORMAL HIGH (ref 70–99)
Glucose-Capillary: 161 mg/dL — ABNORMAL HIGH (ref 70–99)
Glucose-Capillary: 165 mg/dL — ABNORMAL HIGH (ref 70–99)
Glucose-Capillary: 171 mg/dL — ABNORMAL HIGH (ref 70–99)

## 2022-02-06 LAB — HEPATIC FUNCTION PANEL
ALT: 7 U/L (ref 0–44)
AST: 12 U/L — ABNORMAL LOW (ref 15–41)
Albumin: 2.4 g/dL — ABNORMAL LOW (ref 3.5–5.0)
Alkaline Phosphatase: 78 U/L (ref 38–126)
Bilirubin, Direct: 0.2 mg/dL (ref 0.0–0.2)
Indirect Bilirubin: 0.5 mg/dL (ref 0.3–0.9)
Total Bilirubin: 0.7 mg/dL (ref 0.3–1.2)
Total Protein: 6.2 g/dL — ABNORMAL LOW (ref 6.5–8.1)

## 2022-02-06 LAB — HEMOGLOBIN AND HEMATOCRIT, BLOOD
HCT: 27.9 % — ABNORMAL LOW (ref 36.0–46.0)
Hemoglobin: 8.9 g/dL — ABNORMAL LOW (ref 12.0–15.0)

## 2022-02-06 LAB — PHOSPHORUS: Phosphorus: 3.7 mg/dL (ref 2.5–4.6)

## 2022-02-06 LAB — MAGNESIUM: Magnesium: 1.8 mg/dL (ref 1.7–2.4)

## 2022-02-06 MED ORDER — POTASSIUM CHLORIDE 10 MEQ/100ML IV SOLN
10.0000 meq | INTRAVENOUS | Status: AC
  Administered 2022-02-06 (×2): 10 meq via INTRAVENOUS
  Filled 2022-02-06 (×2): qty 100

## 2022-02-06 MED ORDER — OXYCODONE HCL 5 MG PO TABS
5.0000 mg | ORAL_TABLET | ORAL | Status: DC | PRN
  Administered 2022-02-08 – 2022-02-09 (×4): 5 mg via ORAL
  Filled 2022-02-06 (×4): qty 1

## 2022-02-06 MED ORDER — ACETAMINOPHEN 325 MG PO TABS
650.0000 mg | ORAL_TABLET | Freq: Four times a day (QID) | ORAL | Status: DC | PRN
  Administered 2022-02-07 – 2022-02-10 (×5): 650 mg via ORAL
  Filled 2022-02-06 (×5): qty 2

## 2022-02-06 MED ORDER — PANTOPRAZOLE SODIUM 40 MG PO TBEC
40.0000 mg | DELAYED_RELEASE_TABLET | Freq: Two times a day (BID) | ORAL | Status: DC
  Administered 2022-02-06 – 2022-02-12 (×12): 40 mg via ORAL
  Filled 2022-02-06 (×12): qty 1

## 2022-02-06 MED ORDER — POTASSIUM CHLORIDE CRYS ER 20 MEQ PO TBCR
30.0000 meq | EXTENDED_RELEASE_TABLET | Freq: Once | ORAL | Status: AC
  Administered 2022-02-06: 30 meq via ORAL
  Filled 2022-02-06: qty 1

## 2022-02-06 NOTE — Progress Notes (Signed)
Observed pt's NG tube came out 20-25 cm. Pt's refused to advanced her NG tube. Notified MD. And left NG tube to be clamped. No any acute distress or discomfort. Will continue to monitor.

## 2022-02-06 NOTE — Progress Notes (Addendum)
7 Days Post-Op Procedure(s) (LRB): OPEN unilateral SALPINGO OOPHORECTOMY,  hysterectomy  rectosigmoid resection and ileostomy (N/A) CYSTOSCOPY WITH RETROGRADE PYELOGRAM/URETERAL STENT PLACEMENT (Right)  Subjective: Patient reports doing well. Feels much better. No nausea since NGT clamped at 4:30. Ambulating. Voiding freely. Minimal abdominal pain. Increased gas and stool output from ostomy.  Objective: Vital signs in last 24 hours: Temp:  [97.6 F (36.4 C)-98.7 F (37.1 C)] 98.6 F (37 C) (12/25 0623) Pulse Rate:  [87-91] 87 (12/25 0623) Resp:  [12-18] 18 (12/25 0623) BP: (148-160)/(86-95) 155/86 (12/25 0623) SpO2:  [97 %-100 %] 99 % (12/25 0623) Last BM Date : 02/05/22  Intake/Output from previous day: 12/24 0701 - 12/25 0700 In: 3084.8 [I.V.:2827.6; IV Piggyback:257.2] Out: 3419 [Urine:2600; Drains:515; Stool:350]  Physical Examination: General: Alert, oriented, no acute distress.  HEENT: Normocephalic, atraumatic. Sclera anicteric.  Chest: Clear to auscultation bilaterally. No wheezes, rhonchi, or rales. Cardiovascular: regular rate and rhythm, no murmurs, rubs, or gallops.  Abdomen: soft, mildly distended improved from yesterday, normal BS, nontender to palpation. Incision is c/d/I with staples in place. Ostomy bag with approximately with liquid stool in bag, stoma pink and viable.  Right lower quadrant drain stripped, minimal serous fluid in bulb.  Extremities: Grossly normal range of motion. Warm, well perfused. No edema bilaterally.   Labs:    Latest Ref Rng & Units 02/06/2022    2:44 AM 02/05/2022    2:01 AM 02/04/2022    3:40 AM  CBC  WBC 4.0 - 10.5 K/uL 9.8  10.6  12.9   Hemoglobin 12.0 - 15.0 g/dL 8.8  9.3  9.5   Hematocrit 36.0 - 46.0 % 28.1  29.7  30.4   Platelets 150 - 400 K/uL 386  381  407       Latest Ref Rng & Units 02/06/2022    2:44 AM 02/05/2022    2:01 AM 02/04/2022    3:40 AM  CMP  Glucose 70 - 99 mg/dL 152  160  155   BUN 6 - 20 mg/dL '5  9   9   '$ Creatinine 0.44 - 1.00 mg/dL 0.93  1.12  0.95   Sodium 135 - 145 mmol/L 141  138  138   Potassium 3.5 - 5.1 mmol/L 2.7  3.3  3.4   Chloride 98 - 111 mmol/L 106  108  103   CO2 22 - 32 mmol/L '25  24  25   '$ Calcium 8.9 - 10.3 mg/dL 8.7  8.3  8.7   Total Protein 6.5 - 8.1 g/dL 6.2     Total Bilirubin 0.3 - 1.2 mg/dL 0.7     Alkaline Phos 38 - 126 U/L 78     AST 15 - 41 U/L 12     ALT 0 - 44 U/L 7      CT A/P on 12/24: 1. Small irregular fluid collection, 3.7 x 2.2 cm transversely, abuts the inferior margin of the low sigmoid colon anastomosis staple line, suspicious, but not conclusive, for an anastomotic leak. Despite delayed pelvic imaging, contrast did not extend into the descending or sigmoid colon. 2. Small-bowel dilation, with proximal and distal decompressed small bowel. This may reflect an adynamic ileus, but is suspicious for a closed loop partial obstruction, obstruction suggested at the level the posteroinferior pelvic inflammation. 3. Small amount of free air lies anterior to the liver, new since the prior exam. Small amount of ascites, also new since the prior study.  Assessment:  41 y.o. s/p Procedure(s): OPEN unilateral SALPINGO  OOPHORECTOMY,  hysterectomy  rectosigmoid resection and ileostomy CYSTOSCOPY WITH RETROGRADE PYELOGRAM/URETERAL STENT PLACEMENT: post-operative ileus resolving.    Post-op: Slow recovery as anticipated from large surgery with weeks of intra-abdominal infection.  Voiding freely.  Postoperative ileus resolving - increased output from ostomy, NGT with minimal output. Passed clamp trial this am. Will dc and advance to clears.  Clinically continues to improve. By imaging, could not definitely rule out small leak at anastomosis. Will keep drain in.    Hypertension: Suspected to be in the setting of poorly controlled pain.  Improved with improved pain control. Will transition to PO pain medications.   AKI: suspected to be related to Zosyn/vanc  therapy. Vanc discontinued on 12/24. Creatinine downtrending. JP drain output with creatinine similar to blood.  Upper GI bleed: NGT output + for heme yesterday. Stable H&H with no significant output. Discussed with GI. PPI changed to BID. Will continue to monitor.   ID: Vancomycin discontinued 12/24 given AKI. Continue Zosyn at this time.  Slow improvement in leukocytosis.  Will continue to reassess daily for stopping antibiotics.  Patient has now completed 10 days of therapy.   Endo: Continue sliding scale for hyperglycemia in the setting of her diabetes.  She is now n.p.o. with dextrose and her IV fluids.  Anticipate that blood sugars may run a little bit higher.   Prophylaxis: Continue SCDs while in bed, prophylactic Lovenox.   Plan: Dispo:  Discharge plan to include : home health The patient is to be discharged to home. Anticipate discharge next week.   LOS: 9 days    Lafonda Mosses 02/06/2022, 11:30 AM

## 2022-02-06 NOTE — Progress Notes (Signed)
Date and time results received: 02/06/22 0426   Test: potassium Critical Value: K+ 2.7  Name of Provider Notified: DR Jeral Pinch  Orders Received? Or Actions Taken?: MD ordered 2 runs of potassium.

## 2022-02-07 LAB — CBC
HCT: 26.6 % — ABNORMAL LOW (ref 36.0–46.0)
Hemoglobin: 8.3 g/dL — ABNORMAL LOW (ref 12.0–15.0)
MCH: 26.1 pg (ref 26.0–34.0)
MCHC: 31.2 g/dL (ref 30.0–36.0)
MCV: 83.6 fL (ref 80.0–100.0)
Platelets: 392 10*3/uL (ref 150–400)
RBC: 3.18 MIL/uL — ABNORMAL LOW (ref 3.87–5.11)
RDW: 16.3 % — ABNORMAL HIGH (ref 11.5–15.5)
WBC: 9.7 10*3/uL (ref 4.0–10.5)
nRBC: 0 % (ref 0.0–0.2)

## 2022-02-07 LAB — MAGNESIUM: Magnesium: 1.5 mg/dL — ABNORMAL LOW (ref 1.7–2.4)

## 2022-02-07 LAB — PHOSPHORUS: Phosphorus: 3.8 mg/dL (ref 2.5–4.6)

## 2022-02-07 LAB — BASIC METABOLIC PANEL
Anion gap: 8 (ref 5–15)
BUN: 6 mg/dL (ref 6–20)
CO2: 24 mmol/L (ref 22–32)
Calcium: 8.6 mg/dL — ABNORMAL LOW (ref 8.9–10.3)
Chloride: 108 mmol/L (ref 98–111)
Creatinine, Ser: 1.1 mg/dL — ABNORMAL HIGH (ref 0.44–1.00)
GFR, Estimated: >60 mL/min (ref >60)
Glucose, Bld: 153 mg/dL — ABNORMAL HIGH (ref 70–99)
Potassium: 2.9 mmol/L — ABNORMAL LOW (ref 3.5–5.1)
Sodium: 140 mmol/L (ref 135–145)

## 2022-02-07 LAB — GLUCOSE, CAPILLARY
Glucose-Capillary: 111 mg/dL — ABNORMAL HIGH (ref 70–99)
Glucose-Capillary: 136 mg/dL — ABNORMAL HIGH (ref 70–99)
Glucose-Capillary: 154 mg/dL — ABNORMAL HIGH (ref 70–99)
Glucose-Capillary: 158 mg/dL — ABNORMAL HIGH (ref 70–99)
Glucose-Capillary: 172 mg/dL — ABNORMAL HIGH (ref 70–99)
Glucose-Capillary: 231 mg/dL — ABNORMAL HIGH (ref 70–99)

## 2022-02-07 LAB — HEMOGLOBIN AND HEMATOCRIT, BLOOD
HCT: 26.9 % — ABNORMAL LOW (ref 36.0–46.0)
Hemoglobin: 8.5 g/dL — ABNORMAL LOW (ref 12.0–15.0)

## 2022-02-07 MED ORDER — MAGNESIUM OXIDE -MG SUPPLEMENT 400 (240 MG) MG PO TABS
400.0000 mg | ORAL_TABLET | Freq: Once | ORAL | Status: AC
  Administered 2022-02-07: 400 mg via ORAL
  Filled 2022-02-07: qty 1

## 2022-02-07 MED ORDER — POTASSIUM CHLORIDE CRYS ER 20 MEQ PO TBCR
20.0000 meq | EXTENDED_RELEASE_TABLET | ORAL | Status: AC
  Administered 2022-02-07 (×3): 20 meq via ORAL
  Filled 2022-02-07 (×3): qty 1

## 2022-02-07 NOTE — Consult Note (Signed)
Referring Provider: [facility] Primary Care Physician:  Audley Hose, MD Primary Gastroenterologist:  unassigned  Reason for Consultation: Upper GI bleed  HPI: Marie Hill is a 41 y.o. female history of salpingo oophorectomy, hysterectomy rectosigmoid resection end ileostomy, ureteral stent placement, presents for evaluation of upper GI bleed.  History of complex right adnexal mass/tubo-ovarian abscess causing sepsis and abdominal pain.  Underwent open unilateral salpingo-oophorectomy, hysterectomy and rectosigmoid resection end ileostomy with ureteral stent placement 01/30/2022.  History of endometriosis and developed a TOA after outpatient REI procedure.  NGT output positive for heme yesterday.  PPI changed to twice daily.  Hgb 8.9 (9.5 02/04/2022).  BUN 6, creatinine 1.10  Patient states her pain from surgery is somewhat improved. Denies previous GI history. Denies previous NSAID use. Has been having dark semi-solid stool in her ostomy since surgery. NG tube was removed.   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   no meds 1997, treated in June 2016 x 1 month- states neg chest x ray June 2016    Past Surgical History:  Procedure Laterality Date   CYSTOSCOPY W/ URETERAL STENT PLACEMENT Right 01/30/2022   Procedure: CYSTOSCOPY WITH RETROGRADE PYELOGRAM/URETERAL STENT PLACEMENT;  Surgeon: Ceasar Mons, MD;  Location: [facility];  Service: Urology;  Laterality: Right;   LAPAROSCOPIC UNILATERAL SALPINGO OOPHERECTOMY N/A 09/29/2014   Procedure: LAPAROSCOPIC LEFT SALPINGO OOPHORECTOMY ADHESIOLYSIS ;  Surgeon: Everitt Amber, MD;  Location: [facility];  Service: Gynecology;  Laterality: N/A;   ROBOTIC ASSISTED  LAPAROSCOPIC OVARIAN CYSTECTOMY N/A 09/29/2014   Procedure: SI ROBOTIC ASSISTED LAPAROSCOPIC OVARIAN CYSTECTOMY;  Surgeon: Everitt Amber, MD;  Location: [facility];  Service: Gynecology;  Laterality: N/A;   SALPINGOOPHORECTOMY N/A 01/30/2022   Procedure: OPEN unilateral SALPINGO OOPHORECTOMY,  hysterectomy  rectosigmoid resection and ileostomy;  Surgeon: Lafonda Mosses, MD;  Location: [facility];  Service: Gynecology;  Laterality: N/A;   TYMPANOPLASTY Left 08/19/2019   Procedure: LEFT TYMPANOPLASTY;  Surgeon: Rozetta Nunnery, MD;  Location: [facility];  Service: ENT;  Laterality: Left;    Prior to Admission medications   Medication Sig Start Date End Date Taking? Authorizing Provider  acetaminophen (TYLENOL) 500 MG tablet Take 500-1,000 mg by mouth every 6 (six) hours as needed for mild pain or headache.   Yes [provider]  empagliflozin (JARDIANCE) 25 MG TABS tablet Take 25 mg by mouth daily.   Yes [provider]  metFORMIN (GLUCOPHAGE) 500 MG tablet Take 2 tablets (1,000 mg total) by mouth 2 (two) times daily with a meal. 02/01/22  Yes Cross, Melissa D, NP  metFORMIN (GLUCOPHAGE-XR) 500 MG 24 hr tablet Take 1,000 mg by mouth 2 (two) times daily with a meal.   Yes [provider]  MIDOL COMPLETE 500-60-15 MG TABS Take 1-2 tablets by mouth 2 (two) times daily as needed (for pain).   Yes [provider]  rosuvastatin (CRESTOR) 40 MG tablet Take 40 mg by mouth daily.   Yes [provider]  Selenium Sulfide 2.25 % SHAM Apply 1 application  topically See admin instructions. Shampoo as directed   Yes [provider]  sodium chloride flush (NS) 0.9 % SOLN Flush right  lower quadrant drain with 5-10 mL normal saline every day. 01/19/22  Yes Han, Aimee H, PA-C  traMADol (ULTRAM) 50 MG tablet Take 50 mg by mouth every 6 (six) hours as needed for moderate pain or severe pain. 01/10/22  Yes [provider]    Scheduled Meds:   acyclovir ointment   Topical QID   Chlorhexidine Gluconate Cloth  6 each Topical Daily   insulin aspart  0-9 Units Subcutaneous Q4H   pantoprazole  40 mg Oral BID   potassium chloride  20 mEq Oral Q4H   Continuous Infusions:  dextrose 5 % and 0.45 % NaCl with KCl 20 mEq/L 50 mL/hr at 02/06/22 1918   piperacillin-tazobactam (ZOSYN)  IV 3.375 g (02/07/22 0542)   PRN Meds:.acetaminophen, diphenhydrAMINE, HYDROmorphone (DILAUDID) injection, lip balm, LORazepam, [DISCONTINUED] ondansetron **OR** ondansetron (ZOFRAN) IV, oxyCODONE, phenol, sodium chloride flush  Allergies as of 01/27/2022   (No Known Allergies)    Family History  Problem Relation Age of Onset   Hyperlipidemia Mother    Hypertension Mother    Hyperlipidemia Father     Social History   Socioeconomic History   Marital status: Married    Spouse name: Not on file   Number of children: 0   Years of education: Not on file   Highest education level: Not on file  Occupational History   Occupation: Metallurgist  Tobacco Use   Smoking status: Never   Smokeless tobacco: Never  Vaping Use   Vaping Use: Never used  Substance and Sexual Activity   Alcohol use: Not Currently    Alcohol/week: 0.0 standard drinks of alcohol   Drug use: No   Sexual activity: Not Currently    Partners: Male    Birth control/protection: None  Other Topics Concern   Not on file  Social History Narrative   Marital status:  Single; together x 8 years.      Children: none      Lives: with boyfriend; from [place]; moved to [place] at age 49.      Employment:  Cytogeneticist.      Tobacco: none      Alcohol: weekends      Drugs: none      Exercise:  Sporadic      Seatbelt: 100%; no texting   Social Determinants of Health   Financial Resource Strain: Not on file  Food Insecurity: No Food Insecurity (01/28/2022)   Hunger Vital Sign    Worried About Running Out of Food in the Last Year: Never true    Ran Out  of Food in the Last Year: Never true  Transportation Needs: No Transportation Needs (01/28/2022)   PRAPARE - Hydrologist (Medical): No    Lack of Transportation (Non-Medical): No  Physical Activity: Not on file  Stress: Not on file  Social Connections: Not on file  Intimate Partner Violence: Not At Risk (01/28/2022)   Humiliation, Afraid, Rape, and Kick questionnaire    Fear of Current or Ex-Partner: No    Emotionally Abused: No    Physically Abused: No    Sexually Abused: No    Review of Systems: All negative except as stated above in HPI.  Physical Exam:Physical Exam Constitutional:      Appearance: Normal appearance.  HENT:     Head: Normocephalic and atraumatic.     Nose: Nose normal.     Mouth/Throat:     Mouth: Mucous membranes are dry.  Eyes:  General: No scleral icterus.    Extraocular Movements: Extraocular movements intact.  Cardiovascular:     Rate and Rhythm: Normal rate and regular rhythm.  Pulmonary:     Effort: Pulmonary effort is normal. No respiratory distress.  Abdominal:     General: Abdomen is flat. Bowel sounds are normal.     Palpations: Abdomen is soft.     Comments: Ostomy RLQ with dark semi-solid stool noted  Musculoskeletal:        General: No swelling. Normal range of motion.     Cervical back: Normal range of motion and neck supple.  Skin:    General: Skin is warm and dry.  Neurological:     General: No focal deficit present.     Mental Status: She is alert and oriented to person, place, and time.  Psychiatric:        Mood and Affect: Mood normal.        Behavior: Behavior normal.        Thought Content: Thought content normal.        Judgment: Judgment normal.     Vital signs: Vitals:   02/07/22 0009 02/07/22 0404  BP: (!) 146/88 (!) 143/87  Pulse: 94 94  Resp: 14 14  Temp: 99 F (37.2 C) 98.8 F (37.1 C)  SpO2: 98% 96%   Last BM Date : 02/05/22    GI:  Lab Results: Recent Labs     02/05/22 0201 02/06/22 0244 02/06/22 1611 02/07/22 0500  WBC 10.6* 9.8  --  9.7  HGB 9.3* 8.8* 8.9* 8.3*  HCT 29.7* 28.1* 27.9* 26.6*  PLT 381 386  --  392   BMET Recent Labs    02/06/22 0244 02/06/22 1611 02/07/22 0500  NA 141 142 140  K 2.7* 2.9* 2.9*  CL 106 108 108  CO2 '25 26 24  '$ GLUCOSE 152* 120* 153*  BUN 5* 5* 6  CREATININE 0.93 1.01* 1.10*  CALCIUM 8.7* 8.7* 8.6*   LFT Recent Labs    02/06/22 0244  PROT 6.2*  ALBUMIN 2.4*  AST 12*  ALT 7  ALKPHOS 78  BILITOT 0.7  BILIDIR 0.2  IBILI 0.5   PT/INR No results for input(s): "LABPROT", "INR" in the last 72 hours.   Studies/Results: CT ABDOMEN PELVIS W CONTRAST  Result Date: 02/05/2022 CLINICAL DATA:  Postop. Patient has history of a bowel resection. Indwelling postop drain with increased output. EXAM: CT ABDOMEN AND PELVIS WITH CONTRAST TECHNIQUE: Multidetector CT imaging of the abdomen and pelvis was performed using the standard protocol following bolus administration of intravenous contrast. RADIATION DOSE REDUCTION: This exam was performed according to the departmental dose-optimization program which includes automated exposure control, adjustment of the mA and/or kV according to patient size and/or use of iterative reconstruction technique. CONTRAST:  191m OMNIPAQUE IOHEXOL 300 MG/ML  SOLN COMPARISON:  01/27/2022, 01/18/2022, 01/13/2022 FINDINGS: Lower chest: Small left and trace right pleural effusions. Minor dependent lower lobe atelectasis. Hepatobiliary: Liver normal in size. Focal area of low attenuation along the anterior inferior left lobe near the falciform ligament consistent with focal fatty infiltration, stable. Subtle area of relative increased attenuation/enhancement in the posterior aspect of segment 7 associated with a small hypoattenuating tubular structure. This area measures approximately 5 cm in size. No other liver abnormality. Unremarkable gallbladder. No bile duct dilation. Pancreas:  Unremarkable. No pancreatic ductal dilatation or surrounding inflammatory changes. Spleen: Normal in size without focal abnormality. Adrenals/Urinary Tract: No adrenal masses. Kidneys normal in size orientation and position  with symmetric enhancement. No renal mass or stone. No hydronephrosis. Ureters unremarkable. Bladder also unremarkable. Stomach/Bowel: Low sigmoid colon anastomosis staple line. There is an irregular fluid collection that extends from the inferior margin of the nest most a staple line laterally to the right, collection measuring approximately 3.7 x 2.2 cm transversely. Inferior to this collection, likely contiguous with it, is ill-defined fluid with small bubbles of air. Small amount of free air lies anterior to the liver. Left colon is mostly decompressed. There is contrast within right colon extending to near the splenic flexure. No contrast extends into the descending colon or sigmoid colon despite obtaining a delayed pelvis study, which was performed 2 hours after the initial exam. Right colon is normal in caliber. No colonic wall thickening. Stomach is decompressed with a nasogastric tube. There is small-bowel dilation up to a maximum 3.2 cm. Proximal small bowel is decompressed. Apparent transition point between dilated and decompressed small bowel in the posteroinferior pelvis at the level of the inflammation and irregular fluid collection near the anastomosis. Distal small bowel appears to extend into a right lower quadrant ileostomy. Surgical drain inserted through the right low anterior abdominal wall, curls in the right pelvis. Vascular/Lymphatic: No significant vascular findings are present. No enlarged abdominal or pelvic lymph nodes. Reproductive: Status post hysterectomy. Other: Small amount of ascites collects adjacent to the liver and spleen in between leaves of the small bowel mesentery. Musculoskeletal: No acute or significant osseous findings. IMPRESSION: 1. Small irregular  fluid collection, 3.7 x 2.2 cm transversely, abuts the inferior margin of the low sigmoid colon anastomosis staple line, suspicious, but not conclusive, for an anastomotic leak. Despite delayed pelvic imaging, contrast did not extend into the descending or sigmoid colon. 2. Small-bowel dilation, with proximal and distal decompressed small bowel. This may reflect an adynamic ileus, but is suspicious for a closed loop partial obstruction, obstruction suggested at the level the posteroinferior pelvic inflammation. 3. Small amount of free air lies anterior to the liver, new since the prior exam. Small amount of ascites, also new since the prior study. Electronically Signed   By: Lajean Manes M.D.   On: 02/05/2022 13:02   CT PELVIS LIMITED WO CONTRAST  Result Date: 02/05/2022 CLINICAL DATA:  Postop. Patient has history of a bowel resection. Indwelling postop drain with increased output. EXAM: CT ABDOMEN AND PELVIS WITH CONTRAST TECHNIQUE: Multidetector CT imaging of the abdomen and pelvis was performed using the standard protocol following bolus administration of intravenous contrast. RADIATION DOSE REDUCTION: This exam was performed according to the departmental dose-optimization program which includes automated exposure control, adjustment of the mA and/or kV according to patient size and/or use of iterative reconstruction technique. CONTRAST:  122m OMNIPAQUE IOHEXOL 300 MG/ML  SOLN COMPARISON:  01/27/2022, 01/18/2022, 01/13/2022 FINDINGS: Lower chest: Small left and trace right pleural effusions. Minor dependent lower lobe atelectasis. Hepatobiliary: Liver normal in size. Focal area of low attenuation along the anterior inferior left lobe near the falciform ligament consistent with focal fatty infiltration, stable. Subtle area of relative increased attenuation/enhancement in the posterior aspect of segment 7 associated with a small hypoattenuating tubular structure. This area measures approximately 5 cm in  size. No other liver abnormality. Unremarkable gallbladder. No bile duct dilation. Pancreas: Unremarkable. No pancreatic ductal dilatation or surrounding inflammatory changes. Spleen: Normal in size without focal abnormality. Adrenals/Urinary Tract: No adrenal masses. Kidneys normal in size orientation and position with symmetric enhancement. No renal mass or stone. No hydronephrosis. Ureters unremarkable. Bladder also unremarkable. Stomach/Bowel:  Low sigmoid colon anastomosis staple line. There is an irregular fluid collection that extends from the inferior margin of the nest most a staple line laterally to the right, collection measuring approximately 3.7 x 2.2 cm transversely. Inferior to this collection, likely contiguous with it, is ill-defined fluid with small bubbles of air. Small amount of free air lies anterior to the liver. Left colon is mostly decompressed. There is contrast within right colon extending to near the splenic flexure. No contrast extends into the descending colon or sigmoid colon despite obtaining a delayed pelvis study, which was performed 2 hours after the initial exam. Right colon is normal in caliber. No colonic wall thickening. Stomach is decompressed with a nasogastric tube. There is small-bowel dilation up to a maximum 3.2 cm. Proximal small bowel is decompressed. Apparent transition point between dilated and decompressed small bowel in the posteroinferior pelvis at the level of the inflammation and irregular fluid collection near the anastomosis. Distal small bowel appears to extend into a right lower quadrant ileostomy. Surgical drain inserted through the right low anterior abdominal wall, curls in the right pelvis. Vascular/Lymphatic: No significant vascular findings are present. No enlarged abdominal or pelvic lymph nodes. Reproductive: Status post hysterectomy. Other: Small amount of ascites collects adjacent to the liver and spleen in between leaves of the small bowel mesentery.  Musculoskeletal: No acute or significant osseous findings. IMPRESSION: 1. Small irregular fluid collection, 3.7 x 2.2 cm transversely, abuts the inferior margin of the low sigmoid colon anastomosis staple line, suspicious, but not conclusive, for an anastomotic leak. Despite delayed pelvic imaging, contrast did not extend into the descending or sigmoid colon. 2. Small-bowel dilation, with proximal and distal decompressed small bowel. This may reflect an adynamic ileus, but is suspicious for a closed loop partial obstruction, obstruction suggested at the level the posteroinferior pelvic inflammation. 3. Small amount of free air lies anterior to the liver, new since the prior exam. Small amount of ascites, also new since the prior study. Electronically Signed   By: Lajean Manes M.D.   On: 02/05/2022 13:02    Impression: Upper GI bleed - Hgb 8.9 (9.5 02/04/2022).  BUN 6, creatinine 1.10 -CT abdomen pelvis with contrast 02/05/2022: Possible anastomotic leak as sigmoid colon, small bowel dilation suspicious for closed-loop partial obstruction.  Small amount of ascites   Plan: No previous GI history. Dark semi-solid stool since surgery with minor decrease in hgb. NG tube has been removed, no further bleeding. Suspect anemia/bleeding could be related to recent surgery. Recommend allowing patient to recover from surgery and if persistent anemia can consider EGD at that time Continue daily CBC and transfuse as needed to maintain HGB > 7  [facility] will follow at a distance    LOS: 10 days   Garnette Scheuermann  PA-C 02/07/2022, 8:43 AM  Contact #  [phone]

## 2022-02-07 NOTE — Progress Notes (Signed)
8 Days Post-Op Procedure(s) (LRB): OPEN unilateral SALPINGO OOPHORECTOMY,  hysterectomy  rectosigmoid resection and ileostomy (N/A) CYSTOSCOPY WITH RETROGRADE PYELOGRAM/URETERAL STENT PLACEMENT (Right)  Subjective: Patient reports doing well. Tolerating water yesterday. Having some throat pain related to placement of NGT. Denies nausea or emesis. Continued increased output from ostomy. Had soft bowel movement this am and flatus per rectum. Voiding freely.  Objective: Vital signs in last 24 hours: Temp:  [98.8 F (37.1 C)-99.5 F (37.5 C)] 98.8 F (37.1 C) (12/26 0404) Pulse Rate:  [91-94] 94 (12/26 0404) Resp:  [14-18] 14 (12/26 0404) BP: (143-146)/(80-88) 143/87 (12/26 0404) SpO2:  [96 %-98 %] 96 % (12/26 0404) Last BM Date : 02/05/22  Intake/Output from previous day: 12/25 0701 - 12/26 0700 In: 1396.4 [P.O.:240; I.V.:1018.1; IV Piggyback:138.3] Out: 1700 [Urine:1550; Drains:70; Stool:80]  Physical Examination: General: Alert, oriented, no acute distress.  HEENT: Normocephalic, atraumatic. Sclera anicteric.  Chest: Clear to auscultation bilaterally. No wheezes, rhonchi, or rales. Cardiovascular: regular rate and rhythm, no murmurs, rubs, or gallops.  Abdomen: soft, mildly distended improved from yesterday, normal BS, nontender to palpation. Incision is c/d/I with staples in place. Ostomy bag with approximately with liquid stool in bag, stoma pink and viable.  Right lower quadrant drain stripped, minimal serous fluid in bulb.  Extremities: Grossly normal range of motion. Warm, well perfused. No edema bilaterally.   Labs: WBC/Hgb/Hct/Plts:  9.7/8.3/26.6/392 (12/26 0500) BUN/Cr/glu/ALT/AST/amyl/lip:  6/1.10/--/--/--/--/-- (12/26 0500)  CT A/P on 12/24: 1. Small irregular fluid collection, 3.7 x 2.2 cm transversely, abuts the inferior margin of the low sigmoid colon anastomosis staple line, suspicious, but not conclusive, for an anastomotic leak. Despite delayed pelvic imaging,  contrast did not extend into the descending or sigmoid colon. 2. Small-bowel dilation, with proximal and distal decompressed small bowel. This may reflect an adynamic ileus, but is suspicious for a closed loop partial obstruction, obstruction suggested at the level the posteroinferior pelvic inflammation. 3. Small amount of free air lies anterior to the liver, new since the prior exam. Small amount of ascites, also new since the prior study.  Assessment:  41 y.o. s/p Procedure(s): OPEN unilateral SALPINGO OOPHORECTOMY,  hysterectomy  rectosigmoid resection and ileostomy CYSTOSCOPY WITH RETROGRADE PYELOGRAM/URETERAL STENT PLACEMENT: post-operative ileus resolved.    Post-op: Slow recovery as anticipated from large surgery with weeks of intra-abdominal infection.  Voiding freely.  Postoperative ileus resolved. Continues to have increase ostomy output. Tolerated clears yesterday, will advance to full liquids. Tolerating PO regimen now for pain control. Clinically continues to improve. By imaging, could not definitely rule out small leak at anastomosis. Will keep drain in. Less output over last 24 hours, continues to be serous.   Hypertension: Suspected to be in the setting of poorly controlled pain.  Improved with improved pain control.    AKI: suspected to be related to Zosyn/vanc therapy. Vanc discontinued on 12/24. JP drain output with creatinine similar to blood. Will continue IVFs until tolerating more PO.   Upper GI bleed: NGT output + for heme yesterday.  Discussed with GI. PPI changed to BID. H&H had been stable although more drop this am than expected. Will repeat H&H mid-day, if continues to downtrend, will formally consult GI.   ID: Vancomycin discontinued 12/24 given AKI. Continue Zosyn at this time.  Slow improvement in leukocytosis.  Will continue to reassess daily for stopping antibiotics.  Patient has now completed 11 days of therapy during this hospitalization. Anticipate dc  antibiotics prior to discharge.   Endo: Continue sliding scale for hyperglycemia  in the setting of her diabetes.     Prophylaxis: Continue SCDs while in bed, ambulation. Lovenox held starting 12/24 given concern for upper GI bleed.  Plan: Dispo:  Discharge plan to include : home health The patient is to be discharged to home, anticipate dc by end of week or over the weekend.   LOS: 10 days    Marie Hill 02/07/2022, 7:44 AM

## 2022-02-08 LAB — CBC
HCT: 27.5 % — ABNORMAL LOW (ref 36.0–46.0)
Hemoglobin: 8.8 g/dL — ABNORMAL LOW (ref 12.0–15.0)
MCH: 26.6 pg (ref 26.0–34.0)
MCHC: 32 g/dL (ref 30.0–36.0)
MCV: 83.1 fL (ref 80.0–100.0)
Platelets: 422 10*3/uL — ABNORMAL HIGH (ref 150–400)
RBC: 3.31 MIL/uL — ABNORMAL LOW (ref 3.87–5.11)
RDW: 16.1 % — ABNORMAL HIGH (ref 11.5–15.5)
WBC: 10.5 10*3/uL (ref 4.0–10.5)
nRBC: 0 % (ref 0.0–0.2)

## 2022-02-08 LAB — GLUCOSE, CAPILLARY
Glucose-Capillary: 117 mg/dL — ABNORMAL HIGH (ref 70–99)
Glucose-Capillary: 132 mg/dL — ABNORMAL HIGH (ref 70–99)
Glucose-Capillary: 168 mg/dL — ABNORMAL HIGH (ref 70–99)
Glucose-Capillary: 180 mg/dL — ABNORMAL HIGH (ref 70–99)
Glucose-Capillary: 185 mg/dL — ABNORMAL HIGH (ref 70–99)
Glucose-Capillary: 219 mg/dL — ABNORMAL HIGH (ref 70–99)

## 2022-02-08 LAB — BASIC METABOLIC PANEL
Anion gap: 7 (ref 5–15)
BUN: 7 mg/dL (ref 6–20)
CO2: 21 mmol/L — ABNORMAL LOW (ref 22–32)
Calcium: 8.3 mg/dL — ABNORMAL LOW (ref 8.9–10.3)
Chloride: 107 mmol/L (ref 98–111)
Creatinine, Ser: 1.05 mg/dL — ABNORMAL HIGH (ref 0.44–1.00)
GFR, Estimated: >60 mL/min (ref >60)
Glucose, Bld: 170 mg/dL — ABNORMAL HIGH (ref 70–99)
Potassium: 3.3 mmol/L — ABNORMAL LOW (ref 3.5–5.1)
Sodium: 135 mmol/L (ref 135–145)

## 2022-02-08 LAB — PHOSPHORUS: Phosphorus: 3.1 mg/dL (ref 2.5–4.6)

## 2022-02-08 LAB — MAGNESIUM: Magnesium: 1.4 mg/dL — ABNORMAL LOW (ref 1.7–2.4)

## 2022-02-08 MED ORDER — POTASSIUM CHLORIDE CRYS ER 20 MEQ PO TBCR
20.0000 meq | EXTENDED_RELEASE_TABLET | ORAL | Status: AC
  Administered 2022-02-08 – 2022-02-09 (×3): 20 meq via ORAL
  Filled 2022-02-08 (×3): qty 1

## 2022-02-08 MED ORDER — SIMETHICONE 80 MG PO CHEW: 80.0000 mg | CHEWABLE_TABLET | Freq: Four times a day (QID) | ORAL | Status: DC | PRN

## 2022-02-08 MED ORDER — MAGNESIUM OXIDE -MG SUPPLEMENT 400 (240 MG) MG PO TABS
400.0000 mg | ORAL_TABLET | Freq: Two times a day (BID) | ORAL | Status: DC
  Administered 2022-02-08 – 2022-02-21 (×20): 400 mg via ORAL
  Filled 2022-02-08 (×21): qty 1

## 2022-02-08 NOTE — Progress Notes (Signed)
9 Days Post-Op Procedure(s) (LRB): OPEN unilateral SALPINGO OOPHORECTOMY,  hysterectomy  rectosigmoid resection and ileostomy (N/A) CYSTOSCOPY WITH RETROGRADE PYELOGRAM/URETERAL STENT PLACEMENT (Right)  Subjective: Patient reports her belly feeling "grouchy" with intermittent rumbling. Having loose BMs from below with no blood in this. She feels the more she eats, the more she hurts. Ambulating without difficulty but still feels weak. No nausea. Pain is "ok" per pt. No concerns voiced.  Objective: Vital signs in last 24 hours: Temp:  [98 F (36.7 C)-98.3 F (36.8 C)] 98.3 F (36.8 C) (12/27 0628) Pulse Rate:  [71-89] 71 (12/27 0628) Resp:  [16-18] 18 (12/27 0628) BP: (128-164)/(88-104) 153/92 (12/27 0628) SpO2:  [96 %-100 %] 98 % (12/27 0628) Last BM Date : 02/07/22  Intake/Output from previous day: 12/26 0701 - 12/27 0700 In: 2159.3 [P.O.:1080; I.V.:779.4; IV Piggyback:99.9] Out: 2690 [Urine:1400; Drains:90; Stool:1200]  Physical Examination: General: Alert, oriented, no acute distress.  HEENT: Normocephalic, atraumatic. Sclera anicteric.  Chest: Clear to auscultation bilaterally. No wheezes, rhonchi, or rales. Cardiovascular: regular rate and rhythm, no murmurs, rubs, or gallops.  Abdomen: soft, normal BS, nontender to palpation. Incision is c/d/I with staples in place. Ostomy bag with approximately with liquid stool in bag, stoma pink and viable.  Right lower quadrant drain stripped, minimal serous fluid in bulb.  Extremities: Grossly normal range of motion. Warm, well perfused. No edema bilaterally.   Labs: WBC/Hgb/Hct/Plts:  10.5/8.8/27.5/422 (12/27 0154) BUN/Cr/glu/ALT/AST/amyl/lip:  7/1.05/--/--/--/--/-- (12/27 0154)  CT A/P on 12/24: 1. Small irregular fluid collection, 3.7 x 2.2 cm transversely, abuts the inferior margin of the low sigmoid colon anastomosis staple line, suspicious, but not conclusive, for an anastomotic leak. Despite delayed pelvic imaging, contrast  did not extend into the descending or sigmoid colon. 2. Small-bowel dilation, with proximal and distal decompressed small bowel. This may reflect an adynamic ileus, but is suspicious for a closed loop partial obstruction, obstruction suggested at the level the posteroinferior pelvic inflammation. 3. Small amount of free air lies anterior to the liver, new since the prior exam. Small amount of ascites, also new since the prior study.  Assessment: 41 y.o. s/p Procedure(s): OPEN unilateral SALPINGO OOPHORECTOMY,  hysterectomy, rectosigmoid resection and ileostomy CYSTOSCOPY WITH RETROGRADE PYELOGRAM/URETERAL STENT PLACEMENT: post-operative ileus resolved.    Post-op: Slow recovery as anticipated from large surgery with weeks of intra-abdominal infection.  Voiding freely.  Postoperative ileus resolved. Continues to have increase ostomy output. Tolerated diet. Tolerating PO regimen now for pain control. Clinically continues to improve. By imaging, could not definitely rule out small leak at anastomosis. Will keep drain in. Drainage is serous and 100 cc out in last 24 hours.   Hypertension: Suspected to be in the setting of poorly controlled pain.  Improved with improved pain control.    AKI: suspected to be related to Zosyn/vanc therapy. Vanc discontinued on 12/24. JP drain output with creatinine similar to blood. Will continue IVFs until tolerating more PO. Creatinine 1.05 this am.   Upper GI bleed: NGT output + for heme on 12/24.  Findings discussed with GI by Dr. Berline Lopes. PPI changed to BID. Repeat H&H stable.   ID: Vancomycin discontinued 12/24 given AKI. Continue Zosyn at this time.  Slow improvement in leukocytosis.  Will continue to reassess daily for stopping antibiotics.  Patient has now completed 11 days of therapy during this hospitalization. Anticipate dc antibiotics prior to discharge.   Endo: Continue sliding scale for hyperglycemia in the setting of her diabetes.     Prophylaxis:  Continue SCDs while  in bed, ambulation. Lovenox held starting 12/24 given concern for upper GI bleed.  Plan: Simethicone and imodium ordered PT consult placed given deconditioning and persistent weakness Continue plan of care per Dr. Berline Lopes Dispo:  Discharge plan to include : home health The patient is to be discharged to home, anticipate dc by end of week or over the weekend.    LOS: 11 days    Marie Hill 02/08/2022, 6:50 AM

## 2022-02-08 NOTE — Evaluation (Signed)
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 Pt s/p Cystoscopy with right ureteral stent placement (Dr. Lovena Neighbours); exploratory laparotomy with en bloc resection including total hysterectomy, right salpingo-oophorectomy, appendectomy, and rectosigmoid resection, diverting loop ileostomy, and cystoscopy on 01/30/22  Clinical Impression  Pt admitted with above diagnosis. She is normally independent and has support at home if needed.  Today, pt was mod I with transfers and ambulation of 400'.  She demonstrated safe gait and balance. She does demonstrate generalized weakness throughout extremities and core, but expected to improve without need for skilled acute PT services.  Pt could consider outpatient PT for generalized weakness or pelvic floor therapy if not improving on her own at home.        Recommendations for follow up therapy are one component of a multi-disciplinary discharge planning process, led by the attending physician.  Recommendations may be updated based on patient status, additional functional criteria and insurance authorization.  Follow Up Recommendations No PT follow up (but could consider outpt PT if not improving on her own at home)      Assistance Recommended at Discharge PRN  Patient can return home with the following       Equipment Recommendations None recommended by PT  Recommendations for Other Services       Functional Status Assessment Patient has not had a recent decline in their functional status     Precautions / Restrictions Precautions Precautions: None Restrictions Weight Bearing Restrictions: No      Mobility  Bed Mobility Overal bed  mobility: Modified Independent             General bed mobility comments: slowed, use of rails    Transfers Overall transfer level: Modified independent Equipment used: None               General transfer comment: slow transition but able to do safely and without assist    Ambulation/Gait Ambulation/Gait assistance: Modified independent (Device/Increase time) Gait Distance (Feet): 400 Feet Assistive device: None Gait Pattern/deviations: WFL(Within Functional Limits) Gait velocity: decreased     General Gait Details: Pt demonstrated safe gait without LOB or use of AD.  She did demonstrate slowed speed compared to baseline but functional.  Reports she does get weak compared to baseline  Stairs            Wheelchair Mobility    Modified Rankin (Stroke Patients Only)       Balance Overall balance assessment: Independent   Sitting balance-Leahy Scale: Normal     Standing balance support: No upper extremity supported Standing balance-Leahy Scale: Good                               Pertinent Vitals/Pain Pain Assessment Pain Assessment: Faces Faces Pain Scale: Hurts a little bit    Home Living Family/patient expects to be discharged to:: Private residence Living Arrangements: Spouse/significant other Available Help at Discharge: Family;Available 24 hours/day Type of Home: House Home Access: Stairs to enter   [employer] of Steps: 2   Home Layout: Two level;Able to live on main level with bedroom/bathroom Home Equipment: None      Prior Function  Prior Level of Function : Independent/Modified Independent;Working/employed;Driving                     Hand Dominance        Extremity/Trunk Assessment   Upper Extremity Assessment Upper Extremity Assessment: LUE deficits/detail;RUE deficits/detail RUE Deficits / Details: ROM WFL; MMT 4+/5 LUE Deficits / Details: ROM WFL; MMT 4+/5    Lower Extremity  Assessment Lower Extremity Assessment: LLE deficits/detail;RLE deficits/detail RLE Deficits / Details: ROM WFL; MMT5/5 LLE Deficits / Details: ROM WFL; MMT5/5    Cervical / Trunk Assessment Cervical / Trunk Assessment: Normal  Communication   Communication: No difficulties  Cognition Arousal/Alertness: Awake/alert Behavior During Therapy: WFL for tasks assessed/performed Overall Cognitive Status: Within Functional Limits for tasks assessed                                          General Comments General comments (skin integrity, edema, etc.): Educated on walking at least 3 x day with nursing, family, mobility, or own her own if no IV.  Also, encouraged gradual increase of activity at home. Educated on positions of comfort for rest athome. Discussed no skilled acute PT indicated but if she did not feel that her strength was recovering on her own at home could consider outpt PT.  Pt verbalized understanding.    Exercises     Assessment/Plan    PT Assessment Patient does not need any further PT services  PT Problem List         PT Treatment Interventions      PT Goals (Current goals can be found in the Care Plan section)  Acute Rehab PT Goals Patient Stated Goal: return home PT Goal Formulation: All assessment and education complete, DC therapy    Frequency       Co-evaluation               AM-PAC PT "6 Clicks" Mobility  Outcome Measure Help needed turning from your back to your side while in a flat bed without using bedrails?: None Help needed moving from lying on your back to sitting on the side of a flat bed without using bedrails?: None Help needed moving to and from a bed to a chair (including a wheelchair)?: None Help needed standing up from a chair using your arms (e.g., wheelchair or bedside chair)?: None Help needed to walk in hospital room?: None Help needed climbing 3-5 steps with a railing? : A Little 6 Click Score: 23    End of  Session Equipment Utilized During Treatment: Other (comment) (none) Activity Tolerance: Patient tolerated treatment well Patient left: in chair;with call bell/phone within reach Nurse Communication: Mobility status PT Visit Diagnosis: Muscle weakness (generalized) (M62.81)    Time: 8675-4492 PT Time Calculation (min) (ACUTE ONLY): 13 min   Charges:   PT Evaluation $PT Eval Low Complexity: 1 Low          Damali Broadfoot, PT Acute Rehab [facility] [phone]   Karlton Lemon 02/08/2022, 11:12 AM

## 2022-02-08 NOTE — TOC Progression Note (Signed)
Transition of Care [facility]) - Progression Note    Patient Details  Name: Emalyn Schou MRN: [id number] Date of Birth: [birth year]  [facility] CM/SW Contact  Lennart Pall, LCSW Phone Number: 02/08/2022, 9:56 AM  Clinical Narrative:    TOC continues to follow along to monitor if any new dc needs arise.  HHRN has already been arranged with [facility].       Barriers to Discharge: Continued Medical Work up  Expected Discharge Plan and [place] Choice: [place] arrangements for the past 2 months: Single Family Home                 DME Arranged: N/A DME Agency: NA       HH Arranged: RN [place] Agency: [place] Date [place]: 02/02/22 Time [place]: [place] Representative spoke with at [place]: [place] Determinants of Health ([place]) Interventions [place]: No Food Insecurity (01/28/2022)  Housing: Low Risk  (01/28/2022)  Transportation Needs: No Transportation Needs (01/28/2022)  Utilities: Not At Risk (01/28/2022)  Depression (PHQ2-9): Low Risk  (04/22/2020)  Tobacco Use: Low Risk  (01/31/2022)    Readmission Risk Interventions    02/02/2022   11:30 AM 01/20/2022    1:49 PM 12/28/2021    2:04 PM  Readmission Risk Prevention Plan  Post Dischage Appt  Complete Complete  Medication Screening  Complete Complete  Transportation Screening Complete Complete Complete  PCP or Specialist Appt within 5-7 Days Complete    Home Care Screening Complete    Medication Review (RN CM) Complete

## 2022-02-08 NOTE — Consult Note (Addendum)
[place] Nurse ostomy follow up Stoma type/location: RLQ ileostomy Stomal assessment/size: 1 and 1/2 inches round, red, above skin level, ostomy bridge (red rubber catheter) in place. Peristomal assessment: Intact  Output: 50cc dark green liquid stool Ostomy pouching: 2pc. 2 and 1/4 inch pouching system will be used next time.  Current pouching system is 2 3/4 inches with skin barrier ring, aperture is cut off-center due to small body habitus and little space between medial stomal margin and midline incision. Education provided: Pt assisted with pouch change;  she was able to shape barrier ring and apply to the back of the wafer and snap pouch together.  She was able to open and close the Velcro to empty. Discussed pouching routines and ordering supplies.  Enrolled patient in [place] Start Discharge program: Yes, previously Educational materials and 5 sets of wafers/barrier rings, and pouches left in the room.  [place] nursing team will follow, and will remain available to this patient, the nursing, surgical and medical teams.   Thank-you,  Julien Girt MSN, [place], [place], [place], [place]

## 2022-02-09 LAB — GLUCOSE, CAPILLARY
Glucose-Capillary: 126 mg/dL — ABNORMAL HIGH (ref 70–99)
Glucose-Capillary: 129 mg/dL — ABNORMAL HIGH (ref 70–99)
Glucose-Capillary: 134 mg/dL — ABNORMAL HIGH (ref 70–99)
Glucose-Capillary: 148 mg/dL — ABNORMAL HIGH (ref 70–99)
Glucose-Capillary: 158 mg/dL — ABNORMAL HIGH (ref 70–99)
Glucose-Capillary: 200 mg/dL — ABNORMAL HIGH (ref 70–99)

## 2022-02-09 LAB — PHOSPHORUS: Phosphorus: 2.8 mg/dL (ref 2.5–4.6)

## 2022-02-09 LAB — BASIC METABOLIC PANEL
Anion gap: 8 (ref 5–15)
BUN: 9 mg/dL (ref 6–20)
CO2: 21 mmol/L — ABNORMAL LOW (ref 22–32)
Calcium: 8.7 mg/dL — ABNORMAL LOW (ref 8.9–10.3)
Chloride: 109 mmol/L (ref 98–111)
Creatinine, Ser: 0.82 mg/dL (ref 0.44–1.00)
GFR, Estimated: >60 mL/min (ref >60)
Glucose, Bld: 138 mg/dL — ABNORMAL HIGH (ref 70–99)
Potassium: 3.6 mmol/L (ref 3.5–5.1)
Sodium: 138 mmol/L (ref 135–145)

## 2022-02-09 LAB — CBC
HCT: 28 % — ABNORMAL LOW (ref 36.0–46.0)
Hemoglobin: 8.8 g/dL — ABNORMAL LOW (ref 12.0–15.0)
MCH: 26 pg (ref 26.0–34.0)
MCHC: 31.4 g/dL (ref 30.0–36.0)
MCV: 82.8 fL (ref 80.0–100.0)
Platelets: 477 10*3/uL — ABNORMAL HIGH (ref 150–400)
RBC: 3.38 MIL/uL — ABNORMAL LOW (ref 3.87–5.11)
RDW: 16.2 % — ABNORMAL HIGH (ref 11.5–15.5)
WBC: 8.3 10*3/uL (ref 4.0–10.5)
nRBC: 0 % (ref 0.0–0.2)

## 2022-02-09 LAB — MAGNESIUM: Magnesium: 1.6 mg/dL — ABNORMAL LOW (ref 1.7–2.4)

## 2022-02-09 MED ORDER — METFORMIN HCL 500 MG PO TABS
500.0000 mg | ORAL_TABLET | Freq: Two times a day (BID) | ORAL | Status: DC
  Administered 2022-02-09 – 2022-02-12 (×7): 500 mg via ORAL
  Filled 2022-02-09 (×7): qty 1

## 2022-02-09 MED ORDER — ENSURE MAX PROTEIN PO LIQD
11.0000 [oz_av] | Freq: Every day | ORAL | Status: DC
  Administered 2022-02-09 – 2022-02-12 (×3): 11 [oz_av] via ORAL

## 2022-02-09 MED ORDER — INSULIN ASPART 100 UNIT/ML IJ SOLN
0.0000 [IU] | Freq: Three times a day (TID) | INTRAMUSCULAR | Status: DC
  Administered 2022-02-10: 1 [IU] via SUBCUTANEOUS
  Administered 2022-02-10: 2 [IU] via SUBCUTANEOUS
  Administered 2022-02-10: 1 [IU] via SUBCUTANEOUS
  Administered 2022-02-11 – 2022-02-12 (×4): 2 [IU] via SUBCUTANEOUS
  Administered 2022-02-12 – 2022-02-13 (×4): 1 [IU] via SUBCUTANEOUS

## 2022-02-09 MED ORDER — CALCIUM POLYCARBOPHIL 625 MG PO TABS
625.0000 mg | ORAL_TABLET | Freq: Every day | ORAL | Status: DC
  Administered 2022-02-09: 625 mg via ORAL
  Filled 2022-02-09: qty 1

## 2022-02-09 MED ORDER — SODIUM CHLORIDE 0.9 % IV SOLN: INTRAVENOUS | Status: DC

## 2022-02-09 NOTE — Progress Notes (Signed)
Initial Nutrition Assessment  DOCUMENTATION CODES:   Not applicable  INTERVENTION:  - Continue Regular diet.  - Ensure Max po once daily, provides 150 kcal and 30 grams of protein.  - Encourage intake of small and frequent meals and snacks throughout the day.  - Diet education handout provided for Ileostomy Nutrition Therapy and reviewed with patient.  - Education handout also provided in discharge instructions.  - Monitor weight trends.    NUTRITION DIAGNOSIS:   Unintentional weight loss related to chronic illness as evidenced by percent weight loss (14% weight loss in 5 months).  GOAL:   Patient will meet greater than or equal to 90% of their needs, Other (Comment) (Follow ileostomy nutrition therapy recommendations)  MONITOR:   PO intake, Supplement acceptance, Weight trends  REASON FOR ASSESSMENT:   Consult Assessment of nutrition requirement/status (comment: "  Pt with new ileostomy, decreased appetite")  ASSESSMENT:   41 y.o. woman with known history of endometriosis who developed TOA after outpatient REI procedure (transvaginal endometrioma drainage) who presented with worsening symptoms. S/p open unilateral salpingo oophorectomy, hysterectomy, rectosigmoid resection and ileostomy, and ureteral stent placement on 01/30/22.   Patient resting in bed at time of visit. She reports a UBW of 136# she last weighed around 2 months ago and weight loss since that time. Per EMR, patient weighed at 128# in July and weighed at 110# this admission - a 18# or 14% weight loss in 5 months, significant. Patient has not had a weight taken since admission to assess for changes during admission.   Patient reports that PTA she was eating around 2 meals a day plus snacks. Most food prepared for her by her family. However, patient admits her appetite has been "really bad" recently which has impacted intake.   Thankfully, she reports her appetite has improved slightly since admission. Intake  ranges from 0-100%. RD observed several snack foods which appeared to have been brought in by family.  Provided patient with diet education handout "Ileostomy Nutrition Therapy" and reviewed. Discussed importance of a low fiber diet at this time and encouraged adequate fluid intake and intake of small and frequent meals/snacks throughout the day. Handout included foods recommended/not recommended as well as foods that may cause blockage and those that can aid in preventing gas and odor. Discussed keeping a food journal to document tolerance to different foods. Patient receptive to information and reported understanding. Encouraged patient to further review handout and reach out to RD if any questions arise.  Patient is agreeable to try nutrition supplements during admission to support intake.    Medications reviewed and include: insulin, magnesium oxide BID  Labs reviewed:  Magnesium 1.6 HA1C 6.9 (04/13/20)    NUTRITION - FOCUSED PHYSICAL EXAM: Deferred  Diet Order:   Diet Order             Diet regular Room service appropriate? Yes; Fluid consistency: Thin  Diet effective now                   EDUCATION NEEDS:  Education needs have been addressed  Skin:  Skin Assessment: Skin Integrity Issues: Skin Integrity Issues:: Incisions Incisions: Abdomen  Last BM:  ileostomy  Height:  Ht Readings from Last 1 Encounters:  01/28/22 5' 1.81" (1.57 m)   Weight:  Wt Readings from Last 1 Encounters:  01/28/22 50 kg   Ideal Body Weight:     BMI:  Body mass index is 20.28 kg/m.  Estimated Nutritional Needs:  Kcal:  0102-7253  kcals Protein:  65-75 grams Fluid:  >/= 1.5L   Samson Frederic RD, LDN For contact information, refer to [facility]

## 2022-02-09 NOTE — Discharge Instructions (Addendum)
02/21/2022  Return to work: 4-6 weeks if applicable  We will plan on seeing you back in the office on 03/03/22 with lab work before or sooner if needed.  Activity: 1. Be up and out of the bed during the day.  Take a nap if needed.  You may walk up steps but be careful and use the hand rail.  Stair climbing will tire you more than you think, you may need to stop part way and rest.   2. No lifting or straining over 10 lbs, pushing, pulling, straining for 6 weeks.  3. No driving for 2 week(s) after surgery.  Do not drive if you are taking narcotic pain medicine. You need to make sure your reaction time has returned and you can brake safely.  4. Shower daily.  Use your regular soap to bathe and when finished pat your incision dry; don't rub.  No tub baths until cleared by your surgeon.   5. No sexual activity and nothing in the vagina for 10-12 weeks.  6. You may experience a small amount of clear drainage from your incisions, which is normal.  If the drainage persists or increases, please call the office.  7. You may experience vaginal spotting after surgery or around the 6-8 week mark from surgery when the stitches at the top of the vagina begin to dissolve.  The spotting is normal but if you experience heavy bleeding, call our office.  8. Take Tylenol or ibuprofen first for pain and only use narcotic pain medication for severe pain not relieved by the Tylenol or Ibuprofen.  Monitor your Tylenol intake to a max of 4,000 mg.   Diet: 1. Low sodium Heart Healthy Diet is recommended.  2. Call for any changes in ostomy output.  Wound Care: 1. Keep clean and dry.  Shower daily.  Reasons to call the Doctor: Fever - Oral temperature greater than 100.4 degrees Fahrenheit Foul-smelling vaginal discharge Difficulty urinating Nausea and vomiting Increased pain at the site of the incision that is unrelieved with pain medicine. Difficulty breathing with or without chest pain New calf pain  especially if only on one side Sudden, continuing increased vaginal bleeding with or without clots.   Contacts: For questions or concerns you should contact:  Dr. Jeral Pinch at [phone]  Joylene John, NP at [phone]  After Hours: call [phone] and have the GYN Oncologist paged/contacted    Ileostomy Nutrition Therapy   During ileostomy surgery, the entire colon, rectum, and anus are removed or bypassed. The part of the small intestine called the ileum is brought through the abdominal wall, creating a stoma. The stoma is an opening in the abdomen that must be covered with a bag to collect stools at all times. It can be temporary or permanent depending on the surgery.  After surgery, your bowel will be swollen. Your eating plan will begin with a diet of clear liquids. As you recover, you will start eating solid foods, beginning with foods that are low in fiber (see Recommended Foods). You should avoid high-fiber foods because they are harder to digest. Avoiding high-fiber foods will allow the bowel to heal and prevent blockage of the ileostomy.  You should have less than 8 grams of fiber per day when you move from liquids to solid foods and then transition to less than 13 grams of fiber for the whole day in the next few days as your symptoms decrease. Most patients begin to eat more normally 6 weeks after surgery.  The changes to your diet recommended on this handout can help reduce symptoms such as diarrhea, odor, and gas; help you avoid a blockage; and help your body get more nutrients from your food as you heal from surgery.   Tips   You may not feel like eating much, so eat small amounts every 2 to 4 hours. Keep a regular schedule for meals and snacks to help reduce gas and help your body absorb nutrients from foods.  Let your health care provider know if you see whole foods or pills in your ostomy bag.  Do not use time-released, enteric-coated medications or very  large tablets. Also avoid laxatives, which can cause dehydration.  Take a chewable (non-gummy) multivitamin with minerals daily.  Take a chewable or liquid calcium supplement. Liquid calcium citrate can be taken with food or without food.  Have your largest meal in the middle of the day. Do not eat large amounts in the evening. This can help decrease stool output at night, so that you can limit emptying the ostomy bag then.  Eat foods that may thicken stool several times a day. Some foods can change the color of the stool. (See the Food Selection Guide.)   When you begin to add more variety back into your diet, add only 1 new food every few days. If there are foods that bothered you before surgery, add other foods first. Eat only a small amount when you re-try foods. If a food makes you feel sick, wait a few weeks and then re-try it. Keep a log of foods you try and how you feel when eating them.  To reduce gas, avoid chewing gum, drinking with straws and drinking carbonated beverages, smoking or chewing tobacco, eating too fast, and skipping meals. Missing meals can increase gas and watery stools.   Some foods may cause blockages (see Food Selection Guide). Use caution when eating these foods. Eat small amounts and chew your foods well to prevent blockage.  Get enough fluids. Aim for at least 8 to 10 cups of liquid per day. Drink liquids 30 minutes after meals or snacks to avoid flushing foods through your system too quickly.  During times of higher output (1,800 milliliters per day or more) or heavy sweating, you need to drink more fluids. You may need to actually measure how much you are drinking and your output from your ostomy when it is high:   1 ounce is 30 milliliters o 1 cup is 8 ounces  8 cups is 64 ounces or 2 quarts or 2 liters  Watch for signs and symptoms of fluid-electrolyte imbalance. If symptoms occur, seek treatment right away. Symptoms include:   Dry mouth  Reduced urine output (not  as much urine or urinating less often than normal)  Dark-colored urine  Feeling dizzy when you stand up  Noticeable fatigue (feeling extremely tired)  Abdominal cramping  If you have a high output ostomy, you may need to use an oral rehydration solution (ORS) to replace fluid loss. The World Health Organization has a solution in powder form you can buy (called Oral Rehydration Salts). Some sports drinks can increase stoma output, so pediatric electrolyte solutions, such as Pedialyte, are recommended instead. A less expensive option is to make the oral rehydration solution yourself using one of the following recipes:   2 cups Gatorade + 2 cups water +  teaspoon salt  3 cups water + 1 cup orange juice +  teaspoon salt +  teaspoon baking soda  o  cup grape juice or cranberry juice + 3 cups water +  teaspoon salt o 1 cup apple juice + 3 cups water +  teaspoon salt  4 cups (1 liter) water +  teaspoon table salt + 6 level teaspoons sugar (World Health Organization's ORS recipe)  Your registered dietitian nutritionist or doctor may suggest increasing foods that are higher in sodium and potassium. Remember to choose lower fiber foods that are high in potassium. Some examples of high-potassium foods include soy milk, yogurt, cottage cheese, potatoes without skin, liquid supplements (such as Ensure Muscle Health), orange juice or tomato juice (if these do not cause reflux or other symptoms), smooth peanut butter, and baked or broiled salmon or [place]. Salt substitute that contains potassium chloride can also be used, but be careful not to overuse it. Higher-sodium foods added to the diet should also be lower fiber and not fried.   Recommended Foods  If particular foods make you feel unwell, stop eating them and try again in 2 to 3 weeks.  Everyone's tolerance to foods is different. Finding foods that are best for you may require some trial and error.   Dairy Foods  Recommended Foods  Notes   Fat-free  (skim) or low-fat (1%) milk*  Soy milk, rice milk, or almond milk  Lactose-free milk  Yogurt*  Powdered milk  Cheese*  Buttermilk  Low-fat ice cream* or sherbet  If you feel unwell after drinking milk or eating dairy foods, try lactose-free products. Foods marked with an asterisk (*) on this list have lactose.  Aged cheeses (such as cheddar and Swiss) are lower in lactose.  Check labels for calcium content of almond milk and rice milk to make sure they have 30% calcium. These beverages are not high in protein, so include other high-protein foods at meals and snacks to support healing.  Soy milk may cause gas and bloating for some people. It is best to avoid soy milk if it causes symptoms.    Protein Foods  Recommended Foods  Notes   Very tender, well-cooked meats and poultry prepared without added fat  Fish  Smooth nut butter (limit to 1 to 2 tablespoons a day)  Eggs (scrambled eggs are easiest to digest)  When trying nuts, fish, and eggs, start with small amounts. These foods may cause odors.  Use a moist heating method for meats and poultry:   Use water or broth to cook the meat or poultry at a lower temperature.  Cover the dish when cooking in the oven, so the food cooks in its own juices.    Marinate meat first with an acidic ingredient, such as vinegar, lemon juice, or wine, and some oil or by using chopped raw pineapple, which has natural enzymes. Pour off the marinade before cooking.     Grains  Recommended Foods  Notes   Grain foods made from white or refined flour, including bread, bagels, rolls; crackers; pasta; and cereal  White rice  Cream of wheat or cream of rice  Refined grits  Cereals made from refined grains, without added fiber, such as rice chex or cornflakes  Choose grain foods with less than 2 grams of fiber per serving. The grams of dietary fiber in 1 serving are listed on the Nutrition Facts label of packaged foods.  Read labels if you have problems with lactose.  Any foods containing milk may contain lactose.    Vegetables  Recommended Foods  Notes   Well-cooked vegetables without seeds or  skins, such as green beans or carrots  Potatoes without skin  Shredded lettuce on sandwich  Strained vegetable juice  Some vegetables may cause gas, blockages, or odors for some people. See the Food Selection Guide for foods that may cause symptoms.    Fruits  Recommended Foods  Notes   Pulp-free fruit juices (except prune juice)  Ripe banana  Soft melons (watermelon or honeydew)  Peeled and cooked apple  Canned fruits (except pineapple), but avoid heavy syrup, which can make diarrhea worse  These can be added later with doctor's OK to increase fiber:  Some fruits may cause blockages. See the Food Selection Guide for foods that may cause symptoms.  Look for 100% fruit juices. These may need to be diluted in half or used to make oral rehydration solutions to be tolerated well.   ?  ?  Avocado  Orange or grapefruit without  membrane  Grapefruit and grapefruit juice should not be eaten when taking statin-type medications or budesonide (Entocort).    Fats   Recommended Foods  Notes   Any; olive oil and canola oil are good choices for heart health.  Start with very small amounts. Limit fats and oils to less than 8 teaspoons per day. Fats may cause symptoms or discomfort.  Spreads, butter contain lactose.    Beverages  Recommended Beverages  Notes   Water  Decaffeinated coffee or tea  Noncarbonated beverages  Rehydration beverages  Carbonated beverages may cause gas.     Foods Not Recommended    When you first start eating solid foods, avoid foods that are high in fiber, such as whole grains, dried beans, and most raw vegetables and fruits. (See Foods Recommended list for low-fiber foods. See Food Selection Guide lists for foods that cause symptoms.) ? Avoid acidic, spicy, fried, and greasy foods and foods high in sugar.  Limit food and drinks that contain  sugar substitutes. Often diluted sugar-containing drinks do better. Cut fruit juice in half by adding an equal amount of water or more.  While you heal, avoid any foods that cause you to have odor, gas, diarrhea, or obstruction.  You should not have any salad or raw vegetables.  If you have kidney stones, you should try to avoid foods that are high in oxalate. Your RDN may give you a more detailed list of high-oxalate foods if you are at risk. Some foods that are high in oxalate include:   Grains: Wheat bran, wheat germ, whole wheat flour   Protein Foods: Beans, tofu, nuts  Vegetables: Beets, dark leafy greens, sweet potato  Beverages: Beer, cocoa, instant tea, instant coffee  Other: Carob, chocolate    Food Selection Guide for Ileostomy Patients   Foods That May Cause Blockage   Apples, unpeeled  Bean sprouts  Cabbage, raw  Casing on sausage  Celery  Chinese vegetables  Coconut  Coleslaw  Corn  Popcorn  Relishes and olives  Salad greens  Seeds and nuts  Spinach Tough, fibrous meats (for example, steak on grill)  Vegetable and fruit skins  Whole grains  Cucumbers  Dried fruit, raisins  Grapes  Green peppers  Mushrooms  Nuts  Peas  Pickles  Pineapple      Foods That May Cause Gas or Odor  Alcohol  Apples  Asparagus  Bananas  Beer  Broccoli  Brussels sprouts  Cabbage  Carbonated beverages  Cauliflower  Cheese, some types  Corn  Cucumber  Dairy products  Dried beans and peas  Eggs  Fatty foods  Fish  Grapes  Green pepper  Melons  Onions  Peanuts  Prunes  Radishes  Turnips     Foods That May Help Relieve Gas and Odor  Buttermilk  Cranberry juice  Parsley  Yogurt with active cultures      Diarrhea   Initially after surgery, the stool will be liquid and watery because of where the ileostomy is located in the intestine. The stool will gradually become thicker over the next few weeks (more like a consistency of pudding or oatmeal). There are some  foods that can help make the stool thicker or thinner, but if changes to your diet do not improve stools, then your doctor may be able to recommend some medications to help.   Foods That May Cause Diarrhea (looser or more frequent stool)  Alcohol (including beer)  Apricots (and stone fruits)  Beans, baked or legumes  Bran  Broccoli  Brussels sprouts  Cabbage  Caffeinated drinks  (especially hot)  Chocolate  Corn  Fried meats, fish poultry  Fruit juice: apple, grape, orange  Fruit: fresh, canned, or dried  Glucose-free foods containing mannitol or  sorbitol  Gum, sugar free   High-fat foods  High-sugar foods  Licorice  Milk and dairy foods  Nuts or seeds  Peaches (stone fruit)  Peas  Plums (stone fruit)  Prune juice or prunes  Soup  Spicy foods  Sugar-free substitutes  Tomatoes  Turnip greens/green leafy vegetables, raw  Wheat/whole grains  Wine    Foods That May Help Thicken Stool  Applesauce   Bananas   Barley (when OK to have fiber)   Cheese   Marshmallows   Oatmeal (when OK to have fiber)  Pasta (sauces may increase symptoms)  Peanut butter, creamy  Potatoes, no skin  Pretzels  Saltines  Tapioca  White bread (not high fiber)  White rice, boiled  Yogurt   Foods That May Discolor Stool   Turn stool red  Darken stool  Beets  Asparagus  Foods with red dye  Broccoli   Spinach

## 2022-02-09 NOTE — Progress Notes (Signed)
10 Days Post-Op Procedure(s) (LRB): OPEN unilateral SALPINGO OOPHORECTOMY,  hysterectomy  rectosigmoid resection and ileostomy (N/A) CYSTOSCOPY WITH RETROGRADE PYELOGRAM/URETERAL STENT PLACEMENT (Right)  Subjective: Patient states she feels little bit better. Has small bms from rectum when voiding. Feels the output from the ileostomy has slowed. Had rice with soup and an egg yesterday. Taste is improving. Continues to feel her abdomen is "grouchy" with intermittent rumbling. Ambulating without difficulty but still feels weak. No nausea. Pain is manageable per pt. No concerns voiced.  Objective: Vital signs in last 24 hours: Temp:  [97.7 F (36.5 C)-98.4 F (36.9 C)] 98 F (36.7 C) (12/28 0512) Pulse Rate:  [74-85] 74 (12/28 0512) Resp:  [17-18] 18 (12/28 0512) BP: (138-156)/(86-98) 156/86 (12/28 0512) SpO2:  [97 %-99 %] 97 % (12/28 0512) Last BM Date : 02/08/22  Intake/Output from previous day: 12/27 0701 - 12/28 0700 In: 2155.7 [P.O.:1080; I.V.:975.7; IV Piggyback:100] Out: 3300 [Urine:2500; Drains:130; Stool:1000]  Physical Examination: General: Alert, oriented, no acute distress.  HEENT: Normocephalic, atraumatic. Sclera anicteric.  Chest: Clear to auscultation bilaterally. No wheezes, rhonchi, or rales. Cardiovascular: regular rate and rhythm, no murmurs, rubs, or gallops.  Abdomen: soft, normal BS, nontender to palpation, less distended. Incision is c/d/I with staples in place. Ostomy bag with liquid stool in bag, stoma pink and viable.  Right lower quadrant drain stripped, minimal serous fluid in bulb.  Extremities: Grossly normal range of motion. Warm, well perfused. No edema bilaterally.   Labs: WBC/Hgb/Hct/Plts:  8.3/8.8/28.0/477 (12/28 0210) BUN/Cr/glu/ALT/AST/amyl/lip:  9/0.82/--/--/--/--/-- (12/28 0210)  CT A/P on 12/24: 1. Small irregular fluid collection, 3.7 x 2.2 cm transversely, abuts the inferior margin of the low sigmoid colon anastomosis staple line,  suspicious, but not conclusive, for an anastomotic leak. Despite delayed pelvic imaging, contrast did not extend into the descending or sigmoid colon. 2. Small-bowel dilation, with proximal and distal decompressed small bowel. This may reflect an adynamic ileus, but is suspicious for a closed loop partial obstruction, obstruction suggested at the level the posteroinferior pelvic inflammation. 3. Small amount of free air lies anterior to the liver, new since the prior exam. Small amount of ascites, also new since the prior study.  Assessment: 41 y.o. s/p Procedure(s): OPEN unilateral SALPINGO OOPHORECTOMY,  hysterectomy, rectosigmoid resection and ileostomy CYSTOSCOPY WITH RETROGRADE PYELOGRAM/URETERAL STENT PLACEMENT: post-operative ileus resolved.    Post-op: Slow recovery as anticipated from large surgery with weeks of intra-abdominal infection.  Voiding freely.  Postoperative ileus resolved.  Ostomy output slowing. Tolerated diet. Tolerating PO regimen now for pain control. Clinically continues to improve. By imaging, could not definitely rule out small leak at anastomosis. Will keep drain in until discharge-output decreasing from this.   Hypertension: Suspected to be in the setting of poorly controlled pain.  Improved with improved pain control.    AKI: suspected to be related to Zosyn/vanc therapy. Vanc discontinued on 12/24. JP drain output with creatinine similar to blood. Will continue IVFs until tolerating more PO. Creatinine 0.82 from 1.05 yesterday am.   Upper GI bleed: NGT output + for heme on 12/24.  Findings discussed with GI by Dr. Berline Lopes. PPI changed to BID. Repeat H&H stable.   ID: Vancomycin discontinued 12/24 given AKI. Zosyn to be discontinued today.  Slow improvement in leukocytosis with WBC count at 8.3 this am.   Endo: Continue sliding scale for hyperglycemia in the setting of her diabetes.     Prophylaxis: Continue SCDs while in bed, ambulation. Lovenox held  starting 12/24 given concern for upper GI  bleed.  Plan: IVF to [place] IV antibiotics Plan to remove staples tomorrow Holding on imodium at this time since ostomy output slowing Red rubber can be removed at next bag change Appreciate PT eval Dietician consult placed Continue plan of care per Dr. Berline Lopes  Dispo:  Discharge plan to include : home health The patient is to be discharged to home, anticipate dc by end of week or over the weekend.    LOS: 12 days    Marie Hill 02/09/2022, 6:31 AM

## 2022-02-10 ENCOUNTER — Other Ambulatory Visit: Payer: Self-pay | Admitting: Gynecologic Oncology

## 2022-02-10 DIAGNOSIS — I10 Essential (primary) hypertension: Secondary | ICD-10-CM

## 2022-02-10 DIAGNOSIS — N7093 Salpingitis and oophoritis, unspecified: Secondary | ICD-10-CM

## 2022-02-10 DIAGNOSIS — R198 Other specified symptoms and signs involving the digestive system and abdomen: Secondary | ICD-10-CM

## 2022-02-10 LAB — GLUCOSE, CAPILLARY
Glucose-Capillary: 143 mg/dL — ABNORMAL HIGH (ref 70–99)
Glucose-Capillary: 167 mg/dL — ABNORMAL HIGH (ref 70–99)
Glucose-Capillary: 125 mg/dL — ABNORMAL HIGH (ref 70–99)
Glucose-Capillary: 143 mg/dL — ABNORMAL HIGH (ref 70–99)

## 2022-02-10 LAB — CBC
HCT: 28.5 % — ABNORMAL LOW (ref 36.0–46.0)
Hemoglobin: 8.9 g/dL — ABNORMAL LOW (ref 12.0–15.0)
MCH: 25.8 pg — ABNORMAL LOW (ref 26.0–34.0)
MCHC: 31.2 g/dL (ref 30.0–36.0)
MCV: 82.6 fL (ref 80.0–100.0)
Platelets: 524 10*3/uL — ABNORMAL HIGH (ref 150–400)
RBC: 3.45 MIL/uL — ABNORMAL LOW (ref 3.87–5.11)
RDW: 16 % — ABNORMAL HIGH (ref 11.5–15.5)
WBC: 7.3 10*3/uL (ref 4.0–10.5)
nRBC: 0 % (ref 0.0–0.2)

## 2022-02-10 LAB — MAGNESIUM: Magnesium: 1.8 mg/dL (ref 1.7–2.4)

## 2022-02-10 LAB — BASIC METABOLIC PANEL
Anion gap: 6 (ref 5–15)
BUN: 7 mg/dL (ref 6–20)
CO2: 22 mmol/L (ref 22–32)
Calcium: 9 mg/dL (ref 8.9–10.3)
Chloride: 109 mmol/L (ref 98–111)
Creatinine, Ser: 0.77 mg/dL (ref 0.44–1.00)
GFR, Estimated: >60 mL/min (ref >60)
Glucose, Bld: 132 mg/dL — ABNORMAL HIGH (ref 70–99)
Potassium: 3.2 mmol/L — ABNORMAL LOW (ref 3.5–5.1)
Sodium: 137 mmol/L (ref 135–145)

## 2022-02-10 LAB — PHOSPHORUS: Phosphorus: 3.9 mg/dL (ref 2.5–4.6)

## 2022-02-10 MED ORDER — AMLODIPINE BESYLATE 5 MG PO TABS
5.0000 mg | ORAL_TABLET | Freq: Every day | ORAL | Status: DC
  Administered 2022-02-10: 5 mg via ORAL
  Filled 2022-02-10: qty 1

## 2022-02-10 MED ORDER — AMLODIPINE BESYLATE 5 MG PO TABS: 5.0000 mg | ORAL_TABLET | Freq: Every day | ORAL | 3 refills | Status: DC

## 2022-02-10 MED ORDER — POTASSIUM CHLORIDE CRYS ER 20 MEQ PO TBCR
20.0000 meq | EXTENDED_RELEASE_TABLET | Freq: Two times a day (BID) | ORAL | Status: DC
  Administered 2022-02-10: 20 meq via ORAL
  Filled 2022-02-10: qty 1

## 2022-02-10 MED ORDER — LABETALOL HCL 5 MG/ML IV SOLN
10.0000 mg | Freq: Once | INTRAVENOUS | Status: AC
  Administered 2022-02-10: 10 mg via INTRAVENOUS
  Filled 2022-02-10: qty 4

## 2022-02-10 MED ORDER — KCL IN DEXTROSE-NACL 20-5-0.45 MEQ/L-%-% IV SOLN
INTRAVENOUS | Status: AC
  Filled 2022-02-10 (×7): qty 1000

## 2022-02-10 MED ORDER — SIMETHICONE 80 MG PO CHEW
80.0000 mg | CHEWABLE_TABLET | Freq: Four times a day (QID) | ORAL | Status: DC
  Administered 2022-02-10 – 2022-02-21 (×25): 80 mg via ORAL
  Filled 2022-02-10 (×26): qty 1

## 2022-02-10 MED ORDER — LABETALOL HCL 5 MG/ML IV SOLN
10.0000 mg | INTRAVENOUS | Status: DC | PRN
  Administered 2022-02-16: 10 mg via INTRAVENOUS
  Filled 2022-02-10 (×3): qty 4

## 2022-02-10 MED ORDER — SIMETHICONE 80 MG PO CHEW: 80.0000 mg | CHEWABLE_TABLET | Freq: Four times a day (QID) | ORAL | 3 refills | Status: DC | PRN

## 2022-02-10 MED ORDER — HYDROMORPHONE HCL 2 MG PO TABS
2.0000 mg | ORAL_TABLET | ORAL | Status: DC | PRN
  Administered 2022-02-10: 2 mg via ORAL
  Filled 2022-02-10 (×3): qty 1

## 2022-02-10 MED ORDER — MAGNESIUM OXIDE -MG SUPPLEMENT 400 (240 MG) MG PO TABS: 400.0000 mg | ORAL_TABLET | Freq: Every day | ORAL | 3 refills | Status: AC

## 2022-02-10 NOTE — Plan of Care (Signed)
  Problem: Clinical Measurements: Goal: Will remain free from infection Outcome: Not Progressing   Problem: Nutrition: Goal: Adequate nutrition will be maintained Outcome: Not Progressing   Problem: Elimination: Goal: Will not experience complications related to bowel motility Outcome: Not Progressing Goal: Will not experience complications related to urinary retention Outcome: Not Progressing

## 2022-02-10 NOTE — Progress Notes (Signed)
Paged Dr. Berline Lopes to inform her that patient's blood pressure is high at 180/109.

## 2022-02-10 NOTE — Consult Note (Addendum)
[facility] Nurse ostomy follow up Stoma type/location: RLQ ileostomy Stomal assessment/size: 1 and 1/2 inches round, red, above skin level, ostomy bridge (red rubber catheter) removed at this time as ordered.  Peristomal assessment: Intact  Output: 60cc dark green liquid stool Ostomy pouching: 2pc. 2 and 1/4 inch pouching system  used.   Education provided: Pt assisted with pouch change;  she was able to shape barrier ring and apply to the back of the wafer and snap pouch together.  She was able to open and close the Velcro to empty.  Discussed pouching routines and ordering supplies; reviewed dietary precautions and avoiding dehydration.   Enrolled patient in [place] Start Discharge program: Yes, previously Educational materials and 5 sets of wafers/barrier rings, and pouches left in the room. Use supplies:  barrier rings Kellie Simmering # [phone], wafer Kellie Simmering # [phone] and pouch [place] # [id number] [place] nursing team see next week for further teaching sessions if she is still in the hospital at that time.  Thank-you,  Julien Girt MSN, [place], [place], [place], [place]

## 2022-02-10 NOTE — Progress Notes (Signed)
11 Days Post-Op Procedure(s) (LRB): OPEN unilateral SALPINGO OOPHORECTOMY,  hysterectomy  rectosigmoid resection and ileostomy (N/A) CYSTOSCOPY WITH RETROGRADE PYELOGRAM/URETERAL STENT PLACEMENT (Right)  Subjective: Patient states feeling bad today. Having increased abdominal/pelvic cramping and pulling sensation that worsened last pm. Oral pain meds (oxycodone) not helping. Feels pain worsens with eating. Still having adequate output from ostomy. Denies known history of hypertension. Ambulated yesterday but reports pain in abdomen, pulling sensation increasing with this. Had flatus in the bag yesterday. Drank two bottles of water yesterday. Threw up last pm after taking oxycodone.  Objective: Vital signs in last 24 hours: Temp:  [98.4 F (36.9 C)] 98.4 F (36.9 C) (12/29 0522) Pulse Rate:  [76-90] 87 (12/29 0522) Resp:  [17-18] 18 (12/29 0522) BP: (156-180)/(96-109) 156/108 (12/29 0522) SpO2:  [98 %-100 %] 98 % (12/29 0522) Last BM Date : 02/09/22  Intake/Output from previous day: 12/28 0701 - 12/29 0700 In: 1004.1 [P.O.:715; I.V.:289.1] Out: 2575 [Urine:1900; Drains:100; Stool:575]  Physical Examination: General: Alert, oriented, no acute distress, appears uncomfortable.  HEENT: Normocephalic, atraumatic. Sclera anicteric.  Chest: Clear to auscultation bilaterally. No wheezes, rhonchi, or rales. Cardiovascular: regular rate and rhythm, no murmurs, rubs, or gallops.  Abdomen: soft, hypoactive BS, more distended compared with yesterday, mildly tender to palpation. Incision is c/d/I with staples in place. Ostomy bag with liquid stool in bag, stoma pink and viable. Recently changed and red rubber removed by [place].  Right lower quadrant drain stripped, minimal serous fluid in bulb.  Extremities: Grossly normal range of motion. Warm, well perfused. No edema bilaterally.   Labs: WBC/Hgb/Hct/Plts:  7.3/8.9/28.5/524 (12/29 0243) BUN/Cr/glu/ALT/AST/amyl/lip:  7/0.77/--/--/--/--/-- (12/29  0243)  CT A/P on 12/24: 1. Small irregular fluid collection, 3.7 x 2.2 cm transversely, abuts the inferior margin of the low sigmoid colon anastomosis staple line, suspicious, but not conclusive, for an anastomotic leak. Despite delayed pelvic imaging, contrast did not extend into the descending or sigmoid colon. 2. Small-bowel dilation, with proximal and distal decompressed small bowel. This may reflect an adynamic ileus, but is suspicious for a closed loop partial obstruction, obstruction suggested at the level the posteroinferior pelvic inflammation. 3. Small amount of free air lies anterior to the liver, new since the prior exam. Small amount of ascites, also new since the prior study.  Assessment: 41 y.o. s/p Procedure(s): OPEN unilateral SALPINGO OOPHORECTOMY,  hysterectomy, rectosigmoid resection and ileostomy CYSTOSCOPY WITH RETROGRADE PYELOGRAM/URETERAL STENT PLACEMENT: post-operative ileus resolved.    Post-op: Slow recovery as anticipated from large surgery with weeks of intra-abdominal infection.  Voiding freely.  Postoperative ileus resolved but having increased abdominal cramping.  Ostomy output slowing. Tolerated diet. Oxycodone not working for pain control. By imaging, could not definitely rule out small leak at anastomosis. Will keep drain in until discharge-output decreasing from this.   Hypertension: Elevated BP readings. Plan to initiate antihypertensive- norvasc 5 mg per recommendation from [facility] pharmacist, Peggyann Juba.   AKI: suspected to be related to Zosyn/vanc therapy. Vanc discontinued on 12/24. JP drain output with creatinine similar to blood. IVF at [facility]. Creatinine 0.77 from 0.82 from 1.05 on 12/27 am.   Upper GI bleed: NGT output + for heme on 12/24.  Findings discussed with GI by Dr. Berline Lopes. PPI changed to BID. Follow up labs stable.   ID: Vancomycin discontinued 12/24 given AKI. Zosyn to be discontinued 02/09/2022.  Slow improvement in leukocytosis with WBC count  at 7.3 this am from 8.3 yesterday.   Endo: Continue sliding scale for hyperglycemia in the setting of  her diabetes.     Prophylaxis: Continue SCDs while in bed, ambulation. Lovenox held starting 12/24 given concern for upper GI bleed.  Plan: Scheduled simethicone Appreciate WO assistance Discontinue oxycodone. Replace with dilaudid PO prn Begin norvasc 5 mg daily Remove staples today Appreciate PT eval Appreciate Dietician consult  Continue plan of care per Dr. Berline Lopes  Dispo:  Discharge plan to include : home health The patient is to be discharged to home, anticipate dc by end of week or over the weekend.    LOS: 13 days    Marie Hill 02/10/2022, 7:03 AM

## 2022-02-10 NOTE — Progress Notes (Signed)
[facility] Gastroenterology Progress Note  Belissa Kooy 41 y.o. 11/29/1980  CC:  anemia   Subjective: Patient states she continues to feel weak. Continuing to have dark stools. Had some vomiting last night (nonbloody) and is overall not feeling well. Trying to work with PT to get strength improved  ROS : Review of Systems  Constitutional:  Negative for chills, fever and weight loss.  Gastrointestinal:  Positive for abdominal pain, melena, nausea and vomiting. Negative for blood in stool, constipation, diarrhea and heartburn.      Objective: Vital signs in last 24 hours: Vitals:   02/10/22 0204 02/10/22 0522  BP: (!) 162/102 (!) 156/108  Pulse: 89 87  Resp:  18  Temp:  98.4 F (36.9 C)  SpO2:  98%    Physical Exam:  General:  Alert, cooperative, no distress, appears stated age  Head:  Normocephalic, without obvious abnormality, atraumatic  Eyes:  Anicteric sclera, EOM's intact  Lungs:   Clear to auscultation bilaterally, respirations unlabored  Heart:  Regular rate and rhythm, S1, S2 normal  Abdomen:   Soft, generalized tenderness, bowel sounds active all four quadrants,  no masses, ileostomy with minimal dark liquid stool    Lab Results: Recent Labs    02/09/22 0210 02/10/22 0243  NA 138 137  K 3.6 3.2*  CL 109 109  CO2 21* 22  GLUCOSE 138* 132*  BUN 9 7  CREATININE 0.82 0.77  CALCIUM 8.7* 9.0  MG 1.6* 1.8  PHOS 2.8 3.9   No results for input(s): "AST", "ALT", "ALKPHOS", "BILITOT", "PROT", "ALBUMIN" in the last 72 hours. Recent Labs    02/09/22 0210 02/10/22 0243  WBC 8.3 7.3  HGB 8.8* 8.9*  HCT 28.0* 28.5*  MCV 82.8 82.6  PLT 477* 524*   No results for input(s): "LABPROT", "INR" in the last 72 hours.    Assessment Upper GI bleed - Hgb 8.9, stable BUN 7, creatinine 0.77 -CT abdomen pelvis with contrast 02/05/2022: Possible anastomotic leak as sigmoid colon, small bowel dilation suspicious for closed-loop partial obstruction.  Small amount of  ascites   Plan: Hgb continue to remain stable and patient still recovering from surgery. Vomiting was after oxycodone so possibly side effect from medicine. Still no plans for EGD at this time. Recommend patient continue to recover from surgery Continue daily CBC and transfuse as needed to maintain HGB > 7  [facility] will sign off. Please contact us if we can be of any further assistance during this hospital stay.   Garnette Scheuermann PA-C 02/10/2022, 11:03 AM  Contact #  [phone]

## 2022-02-11 LAB — BASIC METABOLIC PANEL
Anion gap: 6 (ref 5–15)
BUN: 5 mg/dL — ABNORMAL LOW (ref 6–20)
CO2: 25 mmol/L (ref 22–32)
Calcium: 8.9 mg/dL (ref 8.9–10.3)
Chloride: 104 mmol/L (ref 98–111)
Creatinine, Ser: 0.7 mg/dL (ref 0.44–1.00)
GFR, Estimated: >60 mL/min (ref >60)
Glucose, Bld: 177 mg/dL — ABNORMAL HIGH (ref 70–99)
Potassium: 3.5 mmol/L (ref 3.5–5.1)
Sodium: 135 mmol/L (ref 135–145)

## 2022-02-11 LAB — GLUCOSE, CAPILLARY
Glucose-Capillary: 155 mg/dL — ABNORMAL HIGH (ref 70–99)
Glucose-Capillary: 106 mg/dL — ABNORMAL HIGH (ref 70–99)
Glucose-Capillary: 168 mg/dL — ABNORMAL HIGH (ref 70–99)
Glucose-Capillary: 145 mg/dL — ABNORMAL HIGH (ref 70–99)

## 2022-02-11 LAB — CBC
HCT: 29.9 % — ABNORMAL LOW (ref 36.0–46.0)
Hemoglobin: 9.6 g/dL — ABNORMAL LOW (ref 12.0–15.0)
MCH: 26.3 pg (ref 26.0–34.0)
MCHC: 32.1 g/dL (ref 30.0–36.0)
MCV: 81.9 fL (ref 80.0–100.0)
Platelets: 565 10*3/uL — ABNORMAL HIGH (ref 150–400)
RBC: 3.65 MIL/uL — ABNORMAL LOW (ref 3.87–5.11)
RDW: 15.9 % — ABNORMAL HIGH (ref 11.5–15.5)
WBC: 10.1 10*3/uL (ref 4.0–10.5)
nRBC: 0 % (ref 0.0–0.2)

## 2022-02-11 LAB — MAGNESIUM: Magnesium: 1.6 mg/dL — ABNORMAL LOW (ref 1.7–2.4)

## 2022-02-11 LAB — PHOSPHORUS: Phosphorus: 4 mg/dL (ref 2.5–4.6)

## 2022-02-11 MED ORDER — HYDROMORPHONE HCL 1 MG/ML IJ SOLN
0.5000 mg | INTRAMUSCULAR | Status: DC
  Administered 2022-02-11 – 2022-02-16 (×30): 0.5 mg via INTRAVENOUS
  Filled 2022-02-11 (×33): qty 0.5

## 2022-02-11 MED ORDER — AMLODIPINE BESYLATE 10 MG PO TABS
10.0000 mg | ORAL_TABLET | Freq: Every day | ORAL | Status: DC
  Administered 2022-02-11 – 2022-02-21 (×8): 10 mg via ORAL
  Filled 2022-02-11 (×8): qty 1

## 2022-02-11 NOTE — Progress Notes (Signed)
12 Days Post-Op Procedure(s) (LRB): OPEN unilateral SALPINGO OOPHORECTOMY,  hysterectomy  rectosigmoid resection and ileostomy (N/A) CYSTOSCOPY WITH RETROGRADE PYELOGRAM/URETERAL STENT PLACEMENT (Right)  Subjective: Patient reports feeling better than yesterday, sill having abdominal cramping. Nausea controlled. Denies any emesis. Voiding without difficulty. Ambulated more yesterday, felt lighter yesterday when walking. + gas and stool output from ostomy - bag emptied 3 times overnight.   Objective: Vital signs in last 24 hours: Temp:  [97.4 F (36.3 C)-98 F (36.7 C)] 97.4 F (36.3 C) (12/30 0556) Pulse Rate:  [84-86] 85 (12/30 0556) Resp:  [16] 16 (12/30 0556) BP: (156-165)/(93-107) 165/107 (12/30 0556) SpO2:  [97 %-99 %] 98 % (12/30 0556) Last BM Date : 02/10/22  Intake/Output from previous day: 12/29 0701 - 12/30 0700 In: 1743.5 [P.O.:660; I.V.:1083.5] Out: 935 [Urine:600; Drains:25; Stool:310]  Physical Examination: General: Alert, oriented, no acute distress.  HEENT: Normocephalic, atraumatic. Sclera anicteric.  Chest: Clear to auscultation bilaterally. No wheezes, rhonchi, or rales. Cardiovascular: regular rate and rhythm, no murmurs, rubs, or gallops.  Abdomen: soft, mildly distended improved from yesterday, mildly hypoactive BS, mildly tender to palpation. Incision is c/d/I with staples in place. Ostomy bag with small amount with liquid stool in bag, stoma pink and viable.  Right lower quadrant drain stripped, minimal serous fluid in bulb.  Extremities: Grossly normal range of motion. Warm, well perfused. No edema bilaterally.   Labs:    Latest Ref Rng & Units 02/11/2022    3:46 AM 02/10/2022    2:43 AM 02/09/2022    2:10 AM  CBC  WBC 4.0 - 10.5 K/uL 10.1  7.3  8.3   Hemoglobin 12.0 - 15.0 g/dL 9.6  8.9  8.8   Hematocrit 36.0 - 46.0 % 29.9  28.5  28.0   Platelets 150 - 400 K/uL 565  524  477       Latest Ref Rng & Units 02/11/2022    3:46 AM 02/10/2022    2:43  AM 02/09/2022    2:10 AM  BMP  Glucose 70 - 99 mg/dL 177  132  138   BUN 6 - 20 mg/dL '5  7  9   '$ Creatinine 0.44 - 1.00 mg/dL 0.70  0.77  0.82   Sodium 135 - 145 mmol/L 135  137  138   Potassium 3.5 - 5.1 mmol/L 3.5  3.2  3.6   Chloride 98 - 111 mmol/L 104  109  109   CO2 22 - 32 mmol/L '25  22  21   '$ Calcium 8.9 - 10.3 mg/dL 8.9  9.0  8.7    Assessment:  41 y.o. s/p Procedure(s): OPEN unilateral SALPINGO OOPHORECTOMY,  hysterectomy  rectosigmoid resection and ileostomy CYSTOSCOPY WITH RETROGRADE PYELOGRAM/URETERAL STENT PLACEMENT: some GI dysfunction again in the setting of pelvic inflammation post-op.  Post-op: Slow recovery as anticipated from large surgery with weeks of intra-abdominal infection.  Voiding freely.  Postoperative ileus resolved initially, now with some evidence of decreased bowel function. Diet changed back to clears and IVFs restarted yesterday. Continues to have ostomy output. Given cramping pain, will restart scheduled IV dilaudid until improved abdominal cramping.  Drain with minimal serous output. Will dc prior to discharge.   Hypertension: Suspected to be in the setting of poorly controlled pain.  Improved with improved pain control. PO antihypertensive started yesterday, dose increased today.   AKI: resolved.   Upper GI bleed: NGT output + for heme.  Discussed with GI. PPI changed to BID. H&H stable. No evidence of ongoing bleeding.  Appreciated GI consult.    ID: Vancomycin discontinued 12/24 given AKI. Zosyn discontinued on 12/28. WBC normal. Mild increase from yesterday suspected to be due to hemoconcentration given poor PO intake yesterday.   Endo: Continue sliding scale for hyperglycemia in the setting of her diabetes. Home metformin restarted.   Prophylaxis: Continue SCDs while in bed, ambulation. Lovenox held starting 12/24 given concern for upper GI bleed. Consider starting aspirin given thrombocytosis in post-op setting.  Plan: Continued bowel rest.  Can cautiously advance diet if improvement in abdominal symptoms. Dispo:  Discharge plan to include : home health. The patient is to be discharged to home.   LOS: 14 days    Lafonda Mosses 02/11/2022, 7:03 AM

## 2022-02-11 NOTE — Consult Note (Signed)
WOC Nurse Consult Note: Reason for Consult: Staple removal midline incision Wound type: surgical site Periwound: RLQ ileostomy Dressing procedure/placement/frequency: Incision site cleansed with CHG wipe.  All staples removed and area cleansed again.  Steri strips applied. Tolerated well.  [place] Nurse ostomy follow up Stoma type/location: RLQ ileostomy Stomal assessment/size:  1 3/4" loop ileostomy Peristomal assessment:  midline incision with steri strips.  Barrier cut off center to avoid overlapping Treatment options for stomal/peristomal skin: barrier ring due to dip in abdominal place from 3 to 7 o'clock to promote seal Output liquid green stool Ostomy pouching: 2pc.  2 1/4" pouch with barrier ring Education provided: discussed twice weekly pouch changes, showering, active life with an ostomy.  She is anxious to return home and settle back into her routine.  Enrolled patient in [place] Start Discharge program: Yes Will follow.  Estrellita Ludwig MSN, RN, FNP-BC CWON Wound, Ostomy, Continence Nurse [facility] [phone] Pager [phone]

## 2022-02-12 ENCOUNTER — Inpatient Hospital Stay (HOSPITAL_COMMUNITY): Payer: Commercial Managed Care - HMO

## 2022-02-12 LAB — CBC
HCT: 29.4 % — ABNORMAL LOW (ref 36.0–46.0)
Hemoglobin: 9.1 g/dL — ABNORMAL LOW (ref 12.0–15.0)
MCH: 25.2 pg — ABNORMAL LOW (ref 26.0–34.0)
MCHC: 31 g/dL (ref 30.0–36.0)
MCV: 81.4 fL (ref 80.0–100.0)
Platelets: 593 10*3/uL — ABNORMAL HIGH (ref 150–400)
RBC: 3.61 MIL/uL — ABNORMAL LOW (ref 3.87–5.11)
RDW: 16.1 % — ABNORMAL HIGH (ref 11.5–15.5)
WBC: 11.2 10*3/uL — ABNORMAL HIGH (ref 4.0–10.5)
nRBC: 0 % (ref 0.0–0.2)

## 2022-02-12 LAB — GLUCOSE, CAPILLARY
Glucose-Capillary: 165 mg/dL — ABNORMAL HIGH (ref 70–99)
Glucose-Capillary: 133 mg/dL — ABNORMAL HIGH (ref 70–99)
Glucose-Capillary: 159 mg/dL — ABNORMAL HIGH (ref 70–99)
Glucose-Capillary: 172 mg/dL — ABNORMAL HIGH (ref 70–99)

## 2022-02-12 LAB — MAGNESIUM: Magnesium: 2 mg/dL (ref 1.7–2.4)

## 2022-02-12 LAB — BASIC METABOLIC PANEL
Anion gap: 7 (ref 5–15)
BUN: <5 mg/dL — ABNORMAL LOW (ref 6–20)
CO2: 26 mmol/L (ref 22–32)
Calcium: 9 mg/dL (ref 8.9–10.3)
Chloride: 103 mmol/L (ref 98–111)
Creatinine, Ser: 0.65 mg/dL (ref 0.44–1.00)
GFR, Estimated: >60 mL/min (ref >60)
Glucose, Bld: 151 mg/dL — ABNORMAL HIGH (ref 70–99)
Potassium: 3.7 mmol/L (ref 3.5–5.1)
Sodium: 136 mmol/L (ref 135–145)

## 2022-02-12 LAB — PHOSPHORUS: Phosphorus: 3.4 mg/dL (ref 2.5–4.6)

## 2022-02-12 MED ORDER — DIATRIZOATE MEGLUMINE & SODIUM 66-10 % PO SOLN
90.0000 mL | Freq: Once | ORAL | Status: AC
  Administered 2022-02-12: 90 mL via ORAL
  Filled 2022-02-12: qty 90

## 2022-02-12 MED ORDER — PANTOPRAZOLE SODIUM 40 MG IV SOLR
40.0000 mg | Freq: Two times a day (BID) | INTRAVENOUS | Status: DC
  Administered 2022-02-12 – 2022-02-19 (×14): 40 mg via INTRAVENOUS
  Filled 2022-02-12 (×14): qty 10

## 2022-02-12 NOTE — Progress Notes (Signed)
13 Days Post-Op Procedure(s) (LRB): OPEN unilateral SALPINGO OOPHORECTOMY,  hysterectomy  rectosigmoid resection and ileostomy (N/A) CYSTOSCOPY WITH RETROGRADE PYELOGRAM/URETERAL STENT PLACEMENT (Right)  Subjective: Patient reports doing well, feels somewhat improved from yesterday. Still having abdominal cramps. Ambulated multiple times, overall feels stronger and less discomfort with ambulation. More solid output from ostomy but overall less in last 24 hours. Voiding freely.  Objective: Vital signs in last 24 hours: Temp:  [97.6 F (36.4 C)-98.3 F (36.8 C)] 98.3 F (36.8 C) (12/31 1224) Pulse Rate:  [83-104] 104 (12/31 1224) Resp:  [16-17] 17 (12/31 1224) BP: (140-166)/(89-100) 144/91 (12/31 1224) SpO2:  [97 %-99 %] 99 % (12/31 1224) Last BM Date : 02/11/22  Intake/Output from previous day: 12/30 0701 - 12/31 0700 In: 3237.5 [P.O.:620; I.V.:2617.5] Out: 3080 [Urine:2950; Drains:35; Stool:95]  Physical Examination: General: Alert, oriented, no acute distress.  HEENT: Normocephalic, atraumatic. Sclera anicteric.  Chest: Clear to auscultation bilaterally. No wheezes, rhonchi, or rales. Cardiovascular: heart rate low 100s, regular rhythm, no murmurs, rubs, or gallops.  Abdomen: soft, mildly distended, more active bowels sounds with periods of tinkling, mildly tender to palpation. Incision is c/d/I with staples in place. Ostomy bag with approximately 24 cc of liquid stool in bag, stoma pink and viable.  Right lower quadrant drain stripped, serous fluid in bulb.  Extremities: Grossly normal range of motion. Warm, well perfused. No edema bilaterally.   Labs:    Latest Ref Rng & Units 02/12/2022    2:10 AM 02/11/2022    3:46 AM 02/10/2022    2:43 AM  CBC  WBC 4.0 - 10.5 K/uL 11.2  10.1  7.3   Hemoglobin 12.0 - 15.0 g/dL 9.1  9.6  8.9   Hematocrit 36.0 - 46.0 % 29.4  29.9  28.5   Platelets 150 - 400 K/uL 593  565  524       Latest Ref Rng & Units 02/12/2022    2:10 AM  02/11/2022    3:46 AM 02/10/2022    2:43 AM  BMP  Glucose 70 - 99 mg/dL 151  177  132   BUN 6 - 20 mg/dL '5  5  7   '$ Creatinine 0.44 - 1.00 mg/dL 0.65  0.70  0.77   Sodium 135 - 145 mmol/L 136  135  137   Potassium 3.5 - 5.1 mmol/L 3.7  3.5  3.2   Chloride 98 - 111 mmol/L 103  104  109   CO2 22 - 32 mmol/L '26  25  22   '$ Calcium 8.9 - 10.3 mg/dL 9.0  8.9  9.0     Assessment:  41 y.o. s/p Procedure(s): OPEN unilateral SALPINGO OOPHORECTOMY,  hysterectomy  rectosigmoid resection and ileostomy CYSTOSCOPY WITH RETROGRADE PYELOGRAM/URETERAL STENT PLACEMENT: some GI dysfunction again in the setting of pelvic inflammation post-op, concern for partial obstruction by exam today. Continues to have ostomy output.   Post-op: Slow recovery as anticipated from large surgery with weeks of intra-abdominal infection.  Voiding freely.  Postoperative ileus resolved initially, now with some evidence of decreased bowel function and concern for partial obstruction. Still tolerated some liquids; will plan SBO protocol without NGT. Diet changed back to clears and IVFs restarted yesterday. Continues to have ostomy output.  Drain with serous output. Will dc prior to discharge.   Hypertension: Suspected to be in the setting of poorly controlled pain.  Improved with improved pain control. PO antihypertensive started 12/29, dose increased 12/30. Has prn IV labetalol ordered.   AKI: resolved.  Upper GI bleed: NGT output + for heme.  Discussed with GI. PPI changed to BID. H&H stable. No evidence of ongoing bleeding. Appreciated GI consult.    ID: Vancomycin discontinued 12/24 given AKI. Zosyn discontinued on 12/28. Mild leukocytosis today, suspect in the setting of GI dysfunction concerning for pSBO.   Endo: Continue sliding scale for hyperglycemia in the setting of her diabetes. Home metformin restarted.   Prophylaxis: Continue SCDs while in bed, ambulation. Lovenox held starting 12/24 given concern for upper GI  bleed. Consider starting aspirin given thrombocytosis in post-op setting.  Plan: Continued bowel rest.  Dispo:  Discharge plan to include : home health. The patient is to be discharged to home.   LOS: 15 days    Marie Hill 02/12/2022, 12:38 PM

## 2022-02-12 NOTE — Progress Notes (Signed)
Called to get an update on the patient this evening. Minimal output from ostomy today. Continued cramping. Emesis at shift change. It appears patient drank contrast around 1400. She will be going down soon for interval xray per SBO protocol.  Given emesis and concern for SBO, I placed order for NGT. I called and spoke with the patient about need for doing this. She is understanding and amenable to NGT placement.  Jeral Pinch MD Gynecologic Oncology

## 2022-02-13 ENCOUNTER — Inpatient Hospital Stay (HOSPITAL_COMMUNITY): Payer: Commercial Managed Care - HMO

## 2022-02-13 LAB — GLUCOSE, CAPILLARY
Glucose-Capillary: 137 mg/dL — ABNORMAL HIGH (ref 70–99)
Glucose-Capillary: 139 mg/dL — ABNORMAL HIGH (ref 70–99)
Glucose-Capillary: 140 mg/dL — ABNORMAL HIGH (ref 70–99)
Glucose-Capillary: 150 mg/dL — ABNORMAL HIGH (ref 70–99)

## 2022-02-13 LAB — CBC
HCT: 29.9 % — ABNORMAL LOW (ref 36.0–46.0)
Hemoglobin: 9.5 g/dL — ABNORMAL LOW (ref 12.0–15.0)
MCH: 25.9 pg — ABNORMAL LOW (ref 26.0–34.0)
MCHC: 31.8 g/dL (ref 30.0–36.0)
MCV: 81.5 fL (ref 80.0–100.0)
Platelets: 617 10*3/uL — ABNORMAL HIGH (ref 150–400)
RBC: 3.67 MIL/uL — ABNORMAL LOW (ref 3.87–5.11)
RDW: 16.2 % — ABNORMAL HIGH (ref 11.5–15.5)
WBC: 11.6 10*3/uL — ABNORMAL HIGH (ref 4.0–10.5)
nRBC: 0 % (ref 0.0–0.2)

## 2022-02-13 LAB — MAGNESIUM: Magnesium: 2.2 mg/dL (ref 1.7–2.4)

## 2022-02-13 LAB — BASIC METABOLIC PANEL
Anion gap: 7 (ref 5–15)
BUN: <5 mg/dL — ABNORMAL LOW (ref 6–20)
CO2: 26 mmol/L (ref 22–32)
Calcium: 8.7 mg/dL — ABNORMAL LOW (ref 8.9–10.3)
Chloride: 101 mmol/L (ref 98–111)
Creatinine, Ser: 0.69 mg/dL (ref 0.44–1.00)
GFR, Estimated: >60 mL/min (ref >60)
Glucose, Bld: 144 mg/dL — ABNORMAL HIGH (ref 70–99)
Potassium: 3.7 mmol/L (ref 3.5–5.1)
Sodium: 134 mmol/L — ABNORMAL LOW (ref 135–145)

## 2022-02-13 LAB — PHOSPHORUS: Phosphorus: 3.9 mg/dL (ref 2.5–4.6)

## 2022-02-13 MED ORDER — DEXTROSE-NACL 5-0.45 % IV SOLN: INTRAVENOUS | Status: AC

## 2022-02-13 MED ORDER — TRAVASOL 10 % IV SOLN: INTRAVENOUS | Status: DC

## 2022-02-13 MED ORDER — POTASSIUM CHLORIDE 10 MEQ/100ML IV SOLN
10.0000 meq | INTRAVENOUS | Status: AC
  Administered 2022-02-13 (×3): 10 meq via INTRAVENOUS
  Filled 2022-02-13: qty 100

## 2022-02-13 MED ORDER — TRAVASOL 10 % IV SOLN
INTRAVENOUS | Status: AC
  Filled 2022-02-13: qty 384

## 2022-02-13 MED ORDER — INSULIN ASPART 100 UNIT/ML IJ SOLN
0.0000 [IU] | Freq: Four times a day (QID) | INTRAMUSCULAR | Status: DC
  Administered 2022-02-13: 1 [IU] via SUBCUTANEOUS
  Administered 2022-02-14: 2 [IU] via SUBCUTANEOUS

## 2022-02-13 NOTE — Progress Notes (Signed)
Nutrition Follow-up  DOCUMENTATION CODES:   Not applicable  INTERVENTION:  - Continue TPN per pharmacy management.   NUTRITION DIAGNOSIS:   Unintentional weight loss related to chronic illness as evidenced by percent weight loss (14% weight loss in 5 months).  GOAL:   Patient will meet greater than or equal to 90% of their needs, Other (Comment) (Follow ileostomy nutrition therapy recommendations)  MONITOR:   PO intake, Supplement acceptance, Weight trends  REASON FOR ASSESSMENT:   Consult Assessment of nutrition requirement/status (comment: "  Pt with new ileostomy, decreased appetite")  ASSESSMENT:   42 y.o. woman with known history of endometriosis who developed TOA after outpatient REI procedure (transvaginal endometrioma drainage) who presented with worsening symptoms. S/p open unilateral salpingo oophorectomy, hysterectomy, rectosigmoid resection and ileostomy, and ureteral stent placement on 01/30/22.  Meds reviewed: sliding scale insulin, mag-ox. Labs reviewed: Na low.   MD consult to initiate TPN. Pt made NPO. NG tube placed to suction. Pharmacy has initiated TPN to start today. RD will continue to monitor POC and diet advancement.   Diet Order:   Diet Order             Diet NPO time specified  Diet effective now                   EDUCATION NEEDS:   Education needs have been addressed  Skin:  Skin Assessment: Skin Integrity Issues: Skin Integrity Issues:: Incisions Incisions: Abdomen  Last BM:  ileostomy  Height:   Ht Readings from Last 1 Encounters:  01/28/22 5' 1.81" (1.57 m)    Weight:   Wt Readings from Last 1 Encounters:  02/13/22 50.6 kg    Ideal Body Weight:     BMI:  Body mass index is 20.53 kg/m.  Estimated Nutritional Needs:   Kcal:  1500-1650 kcals  Protein:  65-75 grams  Fluid:  >/= 1.5L  Ogle Hoeffner Graciela Husbands

## 2022-02-13 NOTE — Progress Notes (Signed)
There's a delay getting the normal NGT to place in patient.  Patient was prepared and ativan given to reduce her anxiety, But, the normal NGT is not available.  Attempted to get it from other floors but there was non. Called ICU to see if they have NGT.  They are looking for one for our patients' use at this point.  All we have is [employer].  Patient has not vomited recently and is sleeping at this moment.

## 2022-02-13 NOTE — Progress Notes (Addendum)
14 Days Post-Op Procedure(s) (LRB): OPEN unilateral SALPINGO OOPHORECTOMY,  hysterectomy  rectosigmoid resection and ileostomy (N/A) CYSTOSCOPY WITH RETROGRADE PYELOGRAM/URETERAL STENT PLACEMENT (Right)  Subjective: Patient reports feeling much better this morning.  She had multiple episodes of emesis in the evening yesterday prior to NG tube placement.  Abdomen is significantly less distended and she has had minimal cramping since NG placement.  Denies any nausea.  Continues to void freely.  Feels stronger and more steady on her feet.  Objective: Vital signs in last 24 hours: Temp:  [98.3 F (36.8 C)] 98.3 F (36.8 C) (01/01 0428) Pulse Rate:  [97-110] 97 (01/01 0428) Resp:  [16-18] 16 (01/01 0428) BP: (144-158)/(91-108) 158/100 (01/01 0428) SpO2:  [97 %-99 %] 97 % (01/01 0428) Last BM Date : 02/12/23  Intake/Output from previous day: 12/31 0701 - 01/01 0700 In: 1397.9 [P.O.:120; I.V.:1277.9] Out: 1728 [Urine:1250; Emesis/NG output:260; Drains:118; Stool:100]  Physical Examination: General: Alert, oriented, no acute distress.  HEENT: Normocephalic, atraumatic. Sclera anicteric.  Chest: Clear to auscultation bilaterally. No wheezes, rhonchi, or rales. Cardiovascular: regular rate and rhythm, no murmurs, rubs, or gallops.  Abdomen: soft, mildly distended with improvement from yesterday, hypoactive bowels sounds with no tinkling appreciated, nontender to palpation. Incision is c/d/I with staples in place. Ostomy bag with approximately 300 cc of liquid stool in bag (emptied), stoma pink and viable.  Right lower quadrant drain stripped, serous fluid in bulb.  Extremities: Grossly normal range of motion. Warm, well perfused. No edema bilaterally.   Labs:    Latest Ref Rng & Units 02/13/2022    2:08 AM 02/12/2022    2:10 AM 02/11/2022    3:46 AM  CBC  WBC 4.0 - 10.5 K/uL 11.6  11.2  10.1   Hemoglobin 12.0 - 15.0 g/dL 9.5  9.1  9.6   Hematocrit 36.0 - 46.0 % 29.9  29.4  29.9    Platelets 150 - 400 K/uL 617  593  565       Latest Ref Rng & Units 02/13/2022    2:08 AM 02/12/2022    2:10 AM 02/11/2022    3:46 AM  BMP  Glucose 70 - 99 mg/dL 144  151  177   BUN 6 - 20 mg/dL <5  <5  5   Creatinine 0.44 - 1.00 mg/dL 0.69  0.65  0.70   Sodium 135 - 145 mmol/L 134  136  135   Potassium 3.5 - 5.1 mmol/L 3.7  3.7  3.5   Chloride 98 - 111 mmol/L 101  103  104   CO2 22 - 32 mmol/L '26  26  25   '$ Calcium 8.9 - 10.3 mg/dL 8.7  9.0  8.9    Xray 12/31: Dilated loops of small bowel in the left upper quadrant and epigastric area measuring up to 3.8 cm in diameter, findings compatible with small-bowel obstruction. Consider further evaluation with CT abdomen pelvis with contrast versus ingestion of radiopaque contrast material and subsequent small-bowel follow-through radiograph series.  8hr post contrast xray: Persistent dilatation of central and left upper quadrant bowel loops, not much interval change. Potentially dilute contrast within dilated small bowel. No additional contrast visible in the colon or in the region of the right lower quadrant ostomy. There is previous contrast within the right colon that is similar in distribution as compared with the radiograph from earlier today.  Xray after NGT placement: Nasogastric tube tip and side port project within the stomach. There is contrast material within the ascending colon. No  dilated small bowel.   Assessment:  42 y.o. s/p Procedure(s): OPEN unilateral SALPINGO OOPHORECTOMY,  hysterectomy  rectosigmoid resection and ileostomy CYSTOSCOPY WITH RETROGRADE PYELOGRAM/URETERAL STENT PLACEMENT:  some GI dysfunction again in the setting of pelvic inflammation post-op, concern for partial obstruction.   Post-op: Slow recovery as anticipated from large surgery with weeks of intra-abdominal infection.    Partial SBO: Dilated loops of small bowel on x-ray yesterday.  Contrast given for small bowel follow-through.  Some  contrast seen in the small bowel at time of follow-up x-ray.  Given multiple episodes of emesis last night, NG tube ordered.  Minimal output but significant improvement in dilated bowel loops on follow-up x-ray.  Patient with good output this morning from ostomy and significant improvement in both her abdominal cramping and distention.  Will plan to continue bowel rest with NG tube to intermittent suction.  I discussed with the patient that if partial SBO does not resolve with conservative management, we will discuss need for surgery over the next several days.  Given no p.o. intake over the last several days and decreased intake prior to that after resolution of postoperative ileus, I discussed with the patient starting TPN today.  Drain with serous output. Will dc prior to discharge.   Hypertension: Initially suspected to be in the setting of poorly controlled pain.  Improved with improved pain control. PO antihypertensive started 12/29, dose increased 12/30. Has prn IV labetalol ordered.   Upper GI bleed: NGT output + for heme.  Discussed with GI. PPI changed to BID. H&H stable. No evidence of ongoing bleeding. Appreciated GI consult. Hold lovenox and NSAIDs.   ID: Vancomycin discontinued 12/24 given AKI. Zosyn discontinued on 12/28. Mild leukocytosis, suspect in the setting of pSBO.   Endo: Continue sliding scale for hyperglycemia in the setting of her diabetes. Home metformin held again since npo.   Prophylaxis: Continue SCDs while in bed, ambulation. Lovenox held starting 12/24 given concern for upper GI bleed. Consider starting aspirin given thrombocytosis in post-op setting.   Plan: Continued bowel rest.  Dispo:  Discharge plan to include : home health. The patient is to be discharged to home.   LOS: 16 days    Lafonda Mosses 02/13/2022, 10:07 AM

## 2022-02-13 NOTE — Progress Notes (Signed)
Peripherally Inserted Central Catheter Placement  The IV Nurse has discussed with the patient and/or persons authorized to consent for the patient, the purpose of this procedure and the potential benefits and risks involved with this procedure.  The benefits include less needle sticks, lab draws from the catheter, and the patient may be discharged home with the catheter. Risks include, but not limited to, infection, bleeding, blood clot (thrombus formation), and puncture of an artery; nerve damage and irregular heartbeat and possibility to perform a PICC exchange if needed/ordered by physician.  Alternatives to this procedure were also discussed.  Bard Power PICC patient education guide, fact sheet on infection prevention and patient information card has been provided to patient /or left at bedside.    PICC Placement Documentation  PICC Double Lumen 73/42/87 Left Basilic 37 cm 1 cm (Active)  Indication for Insertion or Continuance of Line Administration of hyperosmolar/irritating solutions (i.e. TPN, Vancomycin, etc.) 02/13/22 1736  Exposed Catheter (cm) 1 cm 02/13/22 1736  Site Assessment Clean, Dry, Intact 02/13/22 1736  Lumen #1 Status Flushed;Saline locked;Blood return noted 02/13/22 1736  Lumen #2 Status Flushed;Saline locked;Blood return noted 02/13/22 1736  Dressing Type Transparent;Securing device 02/13/22 1736  Dressing Status Antimicrobial disc in place;Clean, Dry, Intact 02/13/22 1736  Safety Lock Not Applicable 68/11/57 2620  Line Adjustment (NICU/IV Team Only) No 02/13/22 1736  Dressing Intervention New dressing;Other (Comment) 02/13/22 1736  Dressing Change Due 02/20/22 02/13/22 1736    LUA SL PICC on brachial vein removed d/t minimal purulent drainage around the catheter. Site is a bit tender per patient. Inserted LUA DL PICC on basilic vein.   Marie Hill 02/13/2022, 5:38 PM

## 2022-02-13 NOTE — Progress Notes (Signed)
NGT was placed around 0100. About 20 cc output but no vomiting.  Two attempt to place NGT.  KUB done after insertion on NGT was seen in the stomach  length is 55 cm.  NGT is at [facility] at 62.

## 2022-02-13 NOTE — Progress Notes (Signed)
PHARMACY - TOTAL PARENTERAL NUTRITION CONSULT NOTE   Indication: Small bowel obstruction  Patient Measurements: Height: 5' 1.81" (157 cm) Weight: 50 kg (110 lb 3.7 oz) IBW/kg (Calculated) : 49.67 TPN AdjBW (KG): 50 Body mass index is 20.28 kg/m. Usual Weight:   Assessment:  Pharmacy is consulted to start TPN on 42 yo female with SBO. Pt initially admitted on 12/2 for treatment of pelvic abscess and then subsequently discharged. Readmitted on 12/16 and under went Cystoscopy with right ureteral stent placement (Dr. Lovena Neighbours); exploratory laparotomy with en bloc resection including total hysterectomy, right salpingo-oophorectomy, appendectomy, and rectosigmoid resection, diverting loop ileostomy, and cystoscopy. Pt subsequently developed SBO, necessitating need for TPN   Glucose / Insulin:  - Metformin now held  - Not on insulin currently  - A1c IP  - CBGs have elevated, range 133-172 in past 24 hours Electrolytes:  - Na low at 134, K+ on lower at 3.7  - Others are WNL, including CorrCa at 10  Renal:  - Scr < 1, BUN < 5 Hepatic:  - 12/25: LFTS WNL, alk phos WNL, albumin 2.4  Intake / Output; MIVF:  - 1250 mL OUP charted  - 250 ml output through NG  - (-)330 mL I/O - D5 1/2 NaCl with 20 meq at 100 ml/hr  GI Imaging: 12/31: Dilated loops of small bowel in the left upper quadrant and epigastric area measuring up to 3.8 cm in diameter, findings compatible with small-bowel obstruction. GI Surgeries / Procedures:  - 12/18 Cystoscopy with right ureteral stent placement (Dr. Lovena Neighbours); exploratory laparotomy with en bloc resection including total hysterectomy, right salpingo-oophorectomy, appendectomy, and rectosigmoid resection, diverting loop ileostomy, and cystoscopy   Central access: PICC line placed 12/8  TPN start date: 1/1   Nutritional Goals: Goal TPN rate is 75 mL/hr (provides 72 g of protein and 1638 kcals per day)  RD Assessment: ( 12/28)  Estimated Needs Total Energy  Estimated Needs: 1500-1650 kcals Total Protein Estimated Needs: 65-75 grams Total Fluid Estimated Needs: >/= 1.5L  Current Nutrition:  NPO  Plan:  Potassium chloride 10 meq IV x 3   Start TPN at 40 mL/hr at 1800 Electrolytes in TPN:  Na 24mq/L,  K 531m/L,  Ca 3 mEq/L,  Mg 65m73mL,  Phos 165m21mL.  Cl:Ac 1:1 Add standard MVI and trace elements to TPN Initiate Sensitive q6h SSI and adjust as needed  Reduce MIVF to 60 mL/hr at 1800. Will remove KCl from maintenance fluid  Monitor TPN labs on Mon/Thurs    NikoRoyetta AsalarmD, BCPS 02/13/2022 11:48 AM

## 2022-02-14 ENCOUNTER — Inpatient Hospital Stay: Payer: Self-pay

## 2022-02-14 LAB — COMPREHENSIVE METABOLIC PANEL
ALT: 12 U/L (ref 0–44)
AST: 18 U/L (ref 15–41)
Albumin: 2.7 g/dL — ABNORMAL LOW (ref 3.5–5.0)
Alkaline Phosphatase: 99 U/L (ref 38–126)
Anion gap: 9 (ref 5–15)
BUN: <5 mg/dL — ABNORMAL LOW (ref 6–20)
CO2: 25 mmol/L (ref 22–32)
Calcium: 8.7 mg/dL — ABNORMAL LOW (ref 8.9–10.3)
Chloride: 102 mmol/L (ref 98–111)
Creatinine, Ser: 0.65 mg/dL (ref 0.44–1.00)
GFR, Estimated: >60 mL/min (ref >60)
Glucose, Bld: 155 mg/dL — ABNORMAL HIGH (ref 70–99)
Potassium: 3.2 mmol/L — ABNORMAL LOW (ref 3.5–5.1)
Sodium: 136 mmol/L (ref 135–145)
Total Bilirubin: 0.5 mg/dL (ref 0.3–1.2)
Total Protein: 6.5 g/dL (ref 6.5–8.1)

## 2022-02-14 LAB — CBC WITH DIFFERENTIAL/PLATELET
Abs Immature Granulocytes: 0.03 10*3/uL (ref 0.00–0.07)
Basophils Absolute: 0 10*3/uL (ref 0.0–0.1)
Basophils Relative: 0 %
Eosinophils Absolute: 0 10*3/uL (ref 0.0–0.5)
Eosinophils Relative: 0 %
HCT: 29.5 % — ABNORMAL LOW (ref 36.0–46.0)
Hemoglobin: 9.1 g/dL — ABNORMAL LOW (ref 12.0–15.0)
Immature Granulocytes: 0 %
Lymphocytes Relative: 26 %
Lymphs Abs: 2.1 10*3/uL (ref 0.7–4.0)
MCH: 25.9 pg — ABNORMAL LOW (ref 26.0–34.0)
MCHC: 30.8 g/dL (ref 30.0–36.0)
MCV: 84 fL (ref 80.0–100.0)
Monocytes Absolute: 0.8 10*3/uL (ref 0.1–1.0)
Monocytes Relative: 10 %
Neutro Abs: 5.2 10*3/uL (ref 1.7–7.7)
Neutrophils Relative %: 64 %
Platelets: 637 10*3/uL — ABNORMAL HIGH (ref 150–400)
RBC: 3.51 MIL/uL — ABNORMAL LOW (ref 3.87–5.11)
RDW: 16.6 % — ABNORMAL HIGH (ref 11.5–15.5)
WBC: 8.1 10*3/uL (ref 4.0–10.5)
nRBC: 0 % (ref 0.0–0.2)

## 2022-02-14 LAB — TRIGLYCERIDES: Triglycerides: 227 mg/dL — ABNORMAL HIGH (ref <150)

## 2022-02-14 LAB — GLUCOSE, CAPILLARY
Glucose-Capillary: 167 mg/dL — ABNORMAL HIGH (ref 70–99)
Glucose-Capillary: 163 mg/dL — ABNORMAL HIGH (ref 70–99)
Glucose-Capillary: 142 mg/dL — ABNORMAL HIGH (ref 70–99)
Glucose-Capillary: 169 mg/dL — ABNORMAL HIGH (ref 70–99)

## 2022-02-14 LAB — PREALBUMIN: Prealbumin: 27 mg/dL (ref 18–38)

## 2022-02-14 LAB — PHOSPHORUS: Phosphorus: 3.8 mg/dL (ref 2.5–4.6)

## 2022-02-14 LAB — MAGNESIUM: Magnesium: 2 mg/dL (ref 1.7–2.4)

## 2022-02-14 MED ORDER — INSULIN ASPART 100 UNIT/ML IJ SOLN: 0.0000 [IU] | INTRAMUSCULAR | Status: DC

## 2022-02-14 MED ORDER — INSULIN ASPART 100 UNIT/ML IJ SOLN
0.0000 [IU] | INTRAMUSCULAR | Status: DC
  Administered 2022-02-14 (×2): 2 [IU] via SUBCUTANEOUS
  Administered 2022-02-14 – 2022-02-15 (×2): 1 [IU] via SUBCUTANEOUS
  Administered 2022-02-15 – 2022-02-17 (×16): 2 [IU] via SUBCUTANEOUS
  Administered 2022-02-17: 1 [IU] via SUBCUTANEOUS
  Administered 2022-02-18 – 2022-02-19 (×6): 2 [IU] via SUBCUTANEOUS
  Administered 2022-02-19 (×2): 1 [IU] via SUBCUTANEOUS

## 2022-02-14 MED ORDER — POTASSIUM CHLORIDE 10 MEQ/100ML IV SOLN
10.0000 meq | INTRAVENOUS | Status: AC
  Administered 2022-02-14 (×5): 10 meq via INTRAVENOUS
  Filled 2022-02-14 (×5): qty 100

## 2022-02-14 MED ORDER — TRAVASOL 10 % IV SOLN
INTRAVENOUS | Status: AC
  Filled 2022-02-14: qty 576

## 2022-02-14 NOTE — Progress Notes (Signed)
PHARMACY - TOTAL PARENTERAL NUTRITION CONSULT NOTE   Indication: Small bowel obstruction  Patient Measurements: Height: 5' 1.81" (157 cm) Weight: 50.6 kg (111 lb 8.8 oz) IBW/kg (Calculated) : 49.67 TPN AdjBW (KG): 50 Body mass index is 20.53 kg/m. Usual Weight:   Assessment:  Pharmacy is consulted to start TPN on 42 yo female with SBO. Pt initially admitted on 12/2 for treatment of pelvic abscess and then subsequently discharged. Readmitted on 12/16 and under went Cystoscopy with right ureteral stent placement (Dr. Lovena Neighbours); exploratory laparotomy with en bloc resection including total hysterectomy, right salpingo-oophorectomy, appendectomy, and rectosigmoid resection, diverting loop ileostomy, and cystoscopy. Pt subsequently developed SBO, necessitating need for TPN   Glucose / Insulin: Hx DM, most recent A1c 6.9% (04/22/20) - PTA meds held:  Metformin, empagliflozin - CBG range 137-167 in past 24 hours  - sSSI:  6 units/ 24 hrs.  Electrolytes:  K low and decreased to 3.2 despite replacement - Others are WNL, including CorrCa at 9.74 Renal: Scr < 1, BUN < 5 Hepatic: LFTS, alk phos, Tbili WNL. - Albumin 2.7 - TG 227 (02/14/22) Intake / Output; MIVF: Net - 640 mL - 2000 mL UOP  - 150 ml NG output, 925 mL stool, 162 mL drain output - D5 1/2 NaCl at 60 ml/hr  GI Imaging: 12/31: small-bowel obstruction. GI Surgeries / Procedures:  - 12/18 Cystoscopy with right ureteral stent placement (Dr. Lovena Neighbours); exploratory laparotomy with en bloc resection including total hysterectomy, right salpingo-oophorectomy, appendectomy, and rectosigmoid resection, diverting loop ileostomy, and cystoscopy   Central access: PICC line (1/1) TPN start date: 1/1   Nutritional Goals: Goal TPN rate is 75 mL/hr to provide 72 g of protein and 1638 kcals per day  RD Assessment: (1/1) Estimated Needs Total Energy Estimated Needs: 1500-1650 kcals Total Protein Estimated Needs: 65-75 grams Total Fluid Estimated  Needs: >/= 1.5L  Current Nutrition:  NPO NG tube: LIS  Plan:  Now:  Potassium chloride 10 meq IV x 5   At 18:00 Advance TPN to 60 mL/hr at 1800 Electrolytes in TPN:  Na 65 mEq/L K 70 mEq/L Ca 3 mEq/L Mg 5 mEq/L  Phos 15 mmol/L.  Cl:Ac 1:1 Add standard MVI and trace elements to TPN Initiate Sensitive q6h SSI and adjust as needed  Reduce MIVF to 40 mL/hr at 1800 Monitor TPN labs on Mon/Thurs  [name] PharmD, BCPS [facility] main pharmacy [phone] 02/14/2022 7:24 AM

## 2022-02-14 NOTE — Consult Note (Signed)
[place] Nurse ostomy follow up Stoma type/location: RLQ ileostomy Stomal assessment/size:  1 3/4" loop ileostomy Peristomal assessment:  midline incision with steri strips.   Treatment options for stomal/peristomal skin: barrier ring due to dip in abdominal place from 3 to 7 o'clock to promote seal Output 150 mls green applesauce consistency stool  Ostomy pouching: 2pc.  2 1/4" pouch with barrier ring Patient able to perform entire pouch change including removal old pouch, cleansing skin around ostomy, cutting new skin barrier, applying barrier ring and skin barrier to ostomy and attaching new pouch.  Patient able to demonstrate use of lock and roll closure system and verbalizes how to clean ostomy spout after emptying.   Enrolled patient in [place] Start Discharge program: Yes Will follow.   Thanks,  [employer] MSN, RN-BC, [employer]

## 2022-02-14 NOTE — Progress Notes (Signed)
15 Days Post-Op Procedure(s) (LRB): OPEN unilateral SALPINGO OOPHORECTOMY,  hysterectomy  rectosigmoid resection and ileostomy (N/A) CYSTOSCOPY WITH RETROGRADE PYELOGRAM/URETERAL STENT PLACEMENT (Right)  Subjective: Patient reports doing well this am. No abdominal pain, cramping reported since NG tube was placed. Denies nausea and emesis.  Continues to void freely.  Feels stronger and more steady on her feet when out of bed. Ostomy has had more solid output, large amount per patient. Patient reporting flatus in the ostomy bag.   Objective: Vital signs in last 24 hours: Temp:  [98.4 F (36.9 C)-98.6 F (37 C)] 98.5 F (36.9 C) (01/02 0610) Pulse Rate:  [82-92] 82 (01/02 0610) Resp:  [16-18] 16 (01/02 0610) BP: (136-149)/(91-95) 149/95 (01/02 0610) SpO2:  [95 %-98 %] 96 % (01/02 0610) Weight:  [111 lb 8.8 oz (50.6 kg)] 111 lb 8.8 oz (50.6 kg) (01/01 1200) Last BM Date : 02/14/22 (from ostomy)  Intake/Output from previous day: 01/01 0701 - 01/02 0700 In: 2596.5 [I.V.:2296.5; IV Piggyback:300] Out: 3016 [Urine:2000; Emesis/NG output:150; Drains:162; Stool:925]  Physical Examination: General: Alert, oriented, no acute distress.  HEENT: Normocephalic, atraumatic. Sclera anicteric.  Chest: Clear to auscultation bilaterally. No wheezes, rhonchi, or rales. Cardiovascular: regular rate and rhythm, no murmurs, rubs, or gallops.  Abdomen: soft, mildly distended, hypoactive bowels sounds with no tinkling appreciated, nontender to palpation. Incision is c/d/I with staples in place. Ostomy bag with thicker output (paste consistency), stoma pink and viable.  Right lower quadrant drain with minimal amount of serous fluid in bulb.  Extremities: Grossly normal range of motion. Warm, well perfused. No edema bilaterally.  NG tube with grayish to clear output with around 100 cc in the canister.  Labs:    Latest Ref Rng & Units 02/14/2022    3:55 AM 02/13/2022    2:08 AM 02/12/2022    2:10 AM  CBC  WBC  4.0 - 10.5 K/uL 8.1  11.6  11.2   Hemoglobin 12.0 - 15.0 g/dL 9.1  9.5  9.1   Hematocrit 36.0 - 46.0 % 29.5  29.9  29.4   Platelets 150 - 400 K/uL 637  617  593       Latest Ref Rng & Units 02/14/2022    3:55 AM 02/13/2022    2:08 AM 02/12/2022    2:10 AM  BMP  Glucose 70 - 99 mg/dL 155  144  151   BUN 6 - 20 mg/dL <5  <5  <5   Creatinine 0.44 - 1.00 mg/dL 0.65  0.69  0.65   Sodium 135 - 145 mmol/L 136  134  136   Potassium 3.5 - 5.1 mmol/L 3.2  3.7  3.7   Chloride 98 - 111 mmol/L 102  101  103   CO2 22 - 32 mmol/L '25  26  26   '$ Calcium 8.9 - 10.3 mg/dL 8.7  8.7  9.0    Xray 12/31: Dilated loops of small bowel in the left upper quadrant and epigastric area measuring up to 3.8 cm in diameter, findings compatible with small-bowel obstruction. Consider further evaluation with CT abdomen pelvis with contrast versus ingestion of radiopaque contrast material and subsequent small-bowel follow-through radiograph series.  8hr post contrast xray: Persistent dilatation of central and left upper quadrant bowel loops, not much interval change. Potentially dilute contrast within dilated small bowel. No additional contrast visible in the colon or in the region of the right lower quadrant ostomy. There is previous contrast within the right colon that is similar in distribution as compared  with the radiograph from earlier today.  Xray after NGT placement: Nasogastric tube tip and side port project within the stomach. There is contrast material within the ascending colon. No dilated small bowel.   Assessment: 42 y.o. s/p Procedure(s): OPEN unilateral SALPINGO OOPHORECTOMY,  hysterectomy  rectosigmoid resection and ileostomy CYSTOSCOPY WITH RETROGRADE PYELOGRAM/URETERAL STENT PLACEMENT:  some GI dysfunction again in the setting of pelvic inflammation post-op, concern for partial obstruction.   Post-op: Slow recovery as anticipated from large surgery with weeks of intra-abdominal infection.     Partial SBO: Dilated loops of small bowel on x-ray 12/31.  Contrast given for small bowel follow-through.  Some contrast seen in the small bowel at time of follow-up x-ray.  Given multiple episodes of emesis last night, NG tube ordered.  Minimal output but significant improvement in dilated bowel loops on follow-up x-ray.  Patient with good output this morning from ostomy and significant improvement in both her abdominal cramping and distention.  Will plan to continue bowel rest with NG tube to intermittent suction.  Discussed with the patient by Dr. Berline Lopes that if partial SBO does not resolve with conservative management, we will discuss need for surgery over the next several days.  Given no p.o. intake over the last several days and decreased intake prior to that after resolution of postoperative ileus, TPN initiated.  Drain with serous output. Will dc prior to discharge.   Hypertension: Initially suspected to be in the setting of poorly controlled pain.  Improved with improved pain control. PO antihypertensive started 12/29, dose increased 12/30. Has prn IV labetalol ordered.   Upper GI bleed: NGT output + for heme.  Discussed with GI. PPI changed to BID. H&H stable. No evidence of ongoing bleeding. Appreciated GI consult. Hold lovenox and NSAIDs.   ID: Vancomycin discontinued 12/24 given AKI. Zosyn discontinued on 12/28. Mild leukocytosis, suspect in the setting of pSBO.   Endo: Continue sliding scale for hyperglycemia in the setting of her diabetes. Home metformin held again since npo.   Prophylaxis: Continue SCDs while in bed, ambulation. Lovenox held starting 12/24 given concern for upper GI bleed. Consider starting aspirin given thrombocytosis in post-op setting.   Plan: Continued bowel rest CBGs to Q4H Encouraged increasing mobility Dispo:  Discharge plan to include : home health. The patient is to be discharged to home.   LOS: 17 days    Marie Hill 02/14/2022, 10:52  AM

## 2022-02-15 ENCOUNTER — Other Ambulatory Visit: Payer: Commercial Managed Care - HMO

## 2022-02-15 ENCOUNTER — Ambulatory Visit: Payer: Commercial Managed Care - HMO | Admitting: Gynecologic Oncology

## 2022-02-15 LAB — BASIC METABOLIC PANEL
Anion gap: 9 (ref 5–15)
BUN: 8 mg/dL (ref 6–20)
CO2: 24 mmol/L (ref 22–32)
Calcium: 8.8 mg/dL — ABNORMAL LOW (ref 8.9–10.3)
Chloride: 102 mmol/L (ref 98–111)
Creatinine, Ser: 0.61 mg/dL (ref 0.44–1.00)
GFR, Estimated: >60 mL/min (ref >60)
Glucose, Bld: 147 mg/dL — ABNORMAL HIGH (ref 70–99)
Potassium: 3.6 mmol/L (ref 3.5–5.1)
Sodium: 135 mmol/L (ref 135–145)

## 2022-02-15 LAB — GLUCOSE, CAPILLARY
Glucose-Capillary: 144 mg/dL — ABNORMAL HIGH (ref 70–99)
Glucose-Capillary: 163 mg/dL — ABNORMAL HIGH (ref 70–99)
Glucose-Capillary: 167 mg/dL — ABNORMAL HIGH (ref 70–99)
Glucose-Capillary: 172 mg/dL — ABNORMAL HIGH (ref 70–99)
Glucose-Capillary: 174 mg/dL — ABNORMAL HIGH (ref 70–99)
Glucose-Capillary: 178 mg/dL — ABNORMAL HIGH (ref 70–99)
Glucose-Capillary: 192 mg/dL — ABNORMAL HIGH (ref 70–99)

## 2022-02-15 LAB — CBC
HCT: 30.4 % — ABNORMAL LOW (ref 36.0–46.0)
Hemoglobin: 9.6 g/dL — ABNORMAL LOW (ref 12.0–15.0)
MCH: 26.2 pg (ref 26.0–34.0)
MCHC: 31.6 g/dL (ref 30.0–36.0)
MCV: 82.8 fL (ref 80.0–100.0)
Platelets: 651 10*3/uL — ABNORMAL HIGH (ref 150–400)
RBC: 3.67 MIL/uL — ABNORMAL LOW (ref 3.87–5.11)
RDW: 16.6 % — ABNORMAL HIGH (ref 11.5–15.5)
WBC: 9.7 10*3/uL (ref 4.0–10.5)
nRBC: 0 % (ref 0.0–0.2)

## 2022-02-15 LAB — PHOSPHORUS: Phosphorus: 4.7 mg/dL — ABNORMAL HIGH (ref 2.5–4.6)

## 2022-02-15 LAB — HEMOGLOBIN A1C
Hgb A1c MFr Bld: 6.5 % — ABNORMAL HIGH (ref 4.8–5.6)
Mean Plasma Glucose: 140 mg/dL

## 2022-02-15 LAB — MAGNESIUM: Magnesium: 2.2 mg/dL (ref 1.7–2.4)

## 2022-02-15 MED ORDER — TRAVASOL 10 % IV SOLN
INTRAVENOUS | Status: AC
  Filled 2022-02-15: qty 720

## 2022-02-15 MED ORDER — SODIUM CHLORIDE 0.9 % IV SOLN: INTRAVENOUS | Status: DC

## 2022-02-15 NOTE — Progress Notes (Signed)
Nutrition Follow-up  INTERVENTION:   -TPN management per Pharmacy  -Monitor for diet advancement  NUTRITION DIAGNOSIS:   Unintentional weight loss related to chronic illness as evidenced by percent weight loss (14% weight loss in 5 months).  Ongoing.  GOAL:   Patient will meet greater than or equal to 90% of their needs, Other (Comment) (Follow ileostomy nutrition therapy recommendations)  Progressing with TPN.  MONITOR:   PO intake, Supplement acceptance, Weight trends, TPN, diet advancement   ASSESSMENT:   42 y.o. woman with known history of endometriosis who developed TOA after outpatient REI procedure (transvaginal endometrioma drainage) who presented with worsening symptoms. S/p open unilateral salpingo oophorectomy, hysterectomy, rectosigmoid resection and ileostomy, and ureteral stent placement on 01/30/22.  12/15: admitted 12/25: CLD 12/26: FLD -> regular diet 12/29: CLD 1/1: NPO for bowel rest  Pt with NGT. Per oncology note, NGT may be clamped later today or tomorrow.  TPN advancing to goal of 75 ml/hr today. This will provide 1638 kcals and 72g protein.   Admission weight: 110 lbs Current weight: 111 lbs  Medications: MAG-OX   Labs reviewed: CBGs: 144-192 Elevated Phos (4.7)  Diet Order:   Diet Order             Diet NPO time specified  Diet effective now                   EDUCATION NEEDS:   Education needs have been addressed  Skin:  Skin Assessment: Skin Integrity Issues: Skin Integrity Issues:: Incisions Incisions: Abdomen  Last BM:  1/3 ileostomy  Height:   Ht Readings from Last 1 Encounters:  01/28/22 5' 1.81" (1.57 m)    Weight:   Wt Readings from Last 1 Encounters:  02/13/22 50.6 kg    BMI:  Body mass index is 20.53 kg/m.  Estimated Nutritional Needs:   Kcal:  1500-1650 kcals  Protein:  65-75 grams  Fluid:  >/= 1.5L  Clayton Bibles, MS, RD, LDN Inpatient Clinical Dietitian Contact information available via  Amion

## 2022-02-15 NOTE — Progress Notes (Signed)
16 Days Post-Op Procedure(s) (LRB): OPEN unilateral SALPINGO OOPHORECTOMY,  hysterectomy  rectosigmoid resection and ileostomy (N/A) CYSTOSCOPY WITH RETROGRADE PYELOGRAM/URETERAL STENT PLACEMENT (Right)  Subjective: Patient reports doing well this am. She had to take pain medication on two occasions due to pulling/cramping sensation noted in the upper abdomen. Denies nausea and emesis.  Continues to void freely. Ambulating without difficulty and feels steady. Patient reporting flatus in the ostomy bag with liquid output. Feels her abdomen is softer and less distended. No needs voiced.  Objective: Vital signs in last 24 hours: Temp:  [98.2 F (36.8 C)-98.9 F (37.2 C)] 98.2 F (36.8 C) (01/03 0442) Pulse Rate:  [83-92] 83 (01/03 0442) Resp:  [16-18] 16 (01/03 0442) BP: (146-159)/(96-97) 146/97 (01/03 0442) SpO2:  [95 %-99 %] 99 % (01/03 0442) Last BM Date : 02/14/22  Intake/Output from previous day: 01/02 0701 - 01/03 0700 In: 2597.7 [I.V.:2148.2; IV Piggyback:449.5] Out: 1941 [Urine:2100; Emesis/NG output:125; Drains:30; Stool:750]  Physical Examination: General: Alert, oriented, no acute distress.  HEENT: Normocephalic, atraumatic. Sclera anicteric.  Chest: Clear to auscultation bilaterally. No wheezes, rhonchi, or rales. Cardiovascular: regular rate and rhythm, no murmurs, rubs, or gallops.  Abdomen: soft, mildly distended-less than 1/2, hypoactive bowels sounds, more active in upper abdomen with no tinkling appreciated, nontender to palpation. Incision is intact with steri strips. Ostomy bag with liquid brown output with flatus in the bag, stoma pink and viable.  Right lower quadrant drain with minimal amount of serous fluid in bulb.  Extremities: Grossly normal range of motion. Warm, well perfused. No edema bilaterally.  NG tube with light yellow output.  Labs:    Latest Ref Rng & Units 02/14/2022    3:55 AM 02/13/2022    2:08 AM 02/12/2022    2:10 AM  CBC  WBC 4.0 - 10.5 K/uL  8.1  11.6  11.2   Hemoglobin 12.0 - 15.0 g/dL 9.1  9.5  9.1   Hematocrit 36.0 - 46.0 % 29.5  29.9  29.4   Platelets 150 - 400 K/uL 637  617  593       Latest Ref Rng & Units 02/15/2022    2:29 AM 02/14/2022    3:55 AM 02/13/2022    2:08 AM  BMP  Glucose 70 - 99 mg/dL 147  155  144   BUN 6 - 20 mg/dL 8  <5  <5   Creatinine 0.44 - 1.00 mg/dL 0.61  0.65  0.69   Sodium 135 - 145 mmol/L 135  136  134   Potassium 3.5 - 5.1 mmol/L 3.6  3.2  3.7   Chloride 98 - 111 mmol/L 102  102  101   CO2 22 - 32 mmol/L '24  25  26   '$ Calcium 8.9 - 10.3 mg/dL 8.8  8.7  8.7    Xray 12/31: Dilated loops of small bowel in the left upper quadrant and epigastric area measuring up to 3.8 cm in diameter, findings compatible with small-bowel obstruction. Consider further evaluation with CT abdomen pelvis with contrast versus ingestion of radiopaque contrast material and subsequent small-bowel follow-through radiograph series.  8hr post contrast xray: Persistent dilatation of central and left upper quadrant bowel loops, not much interval change. Potentially dilute contrast within dilated small bowel. No additional contrast visible in the colon or in the region of the right lower quadrant ostomy. There is previous contrast within the right colon that is similar in distribution as compared with the radiograph from earlier today.  Xray after NGT placement: Nasogastric  tube tip and side port project within the stomach. There is contrast material within the ascending colon. No dilated small bowel.   Assessment: 42 y.o. s/p Procedure(s): OPEN unilateral SALPINGO OOPHORECTOMY,  hysterectomy  rectosigmoid resection and ileostomy, CYSTOSCOPY WITH RETROGRADE PYELOGRAM/URETERAL STENT PLACEMENT:  some GI dysfunction again in the setting of pelvic inflammation post-op, concern for partial obstruction.   Post-op: Slow recovery as anticipated from large surgery with weeks of intra-abdominal infection.    Partial SBO: Dilated loops of  small bowel on x-ray 12/31.  Contrast given for small bowel follow-through.  Some contrast seen in the small bowel at time of follow-up x-ray.  Given multiple episodes of emesis on 12/31, NG tube ordered.  Good output this morning from ostomy and significant improvement in both her abdominal cramping and distention. Plan for possible trial of NG tube clamping later today or tomorrow per Dr. Berline Lopes.  Given no p.o. intake over the last several days and decreased intake prior to that after resolution of postoperative ileus, TPN initiated. Drain with serous output. Will dc prior to discharge.   Hypertension: Initially suspected to be in the setting of poorly controlled pain.  Improved with improved pain control. PO antihypertensive started 12/29, dose increased 12/30. Has prn IV labetalol ordered.   Upper GI bleed: NGT output + for heme on 12/24.  Discussed with GI. PPI changed to BID. H&H stable. No evidence of ongoing bleeding. Appreciated GI consult. Hold lovenox and NSAIDs.   ID: Vancomycin discontinued 12/24 given AKI. Zosyn discontinued on 12/28. Mild leukocytosis improving, suspect in the setting of pSBO.   Endo: Continue sliding scale for hyperglycemia in the setting of her diabetes. Home metformin held again since npo.   Prophylaxis: Continue SCDs while in bed, ambulation. Lovenox held starting 12/24 given concern for upper GI bleed. Consideration of starting aspirin given thrombocytosis in post-op setting.   Plan: Possible trial of NG tube clamping today or tomorrow Encouraged increasing mobility Continue plan of care per Dr. Berline Lopes Dispo:  Discharge plan to include : home health. The patient is to be discharged to home.   LOS: 18 days    Dorothyann Gibbs 02/15/2022, 9:56 AM

## 2022-02-15 NOTE — TOC Progression Note (Signed)
Transition of Care [facility]) - Progression Note    Patient Details  Name: Marie Hill MRN: [id number] Date of Birth: 06/25/1980  [facility] CM/SW Contact  Lennart Pall, LCSW Phone Number: 02/15/2022, 10:34 AM  Clinical Narrative:    TOC continues to follow.  HHRN secured with [facility].     Barriers to Discharge: Continued Medical Work up  Expected Discharge Plan and [place] Choice: [place] arrangements for the past 2 months: Single Family Home                 DME Arranged: N/A DME Agency: NA       HH Arranged: RN [place] Agency: [place] Date [place]: 02/02/22 Time [place]: [place] Representative spoke with at [place]: [place] Determinants of Health ([place]) Interventions [place]: No Food Insecurity (01/28/2022)  Housing: Low Risk  (01/28/2022)  Transportation Needs: No Transportation Needs (01/28/2022)  Utilities: Not At Risk (01/28/2022)  Depression (PHQ2-9): Low Risk  (04/22/2020)  Tobacco Use: Low Risk  (01/31/2022)    Readmission Risk Interventions    02/02/2022   11:30 AM 01/20/2022    1:49 PM 12/28/2021    2:04 PM  Readmission Risk Prevention Plan  Post Dischage Appt  Complete Complete  Medication Screening  Complete Complete  Transportation Screening Complete Complete Complete  PCP or Specialist Appt within 5-7 Days Complete    Home Care Screening Complete    Medication Review (RN CM) Complete

## 2022-02-15 NOTE — Progress Notes (Signed)
PHARMACY - TOTAL PARENTERAL NUTRITION CONSULT NOTE   Indication: Small bowel obstruction  Patient Measurements: Height: 5' 1.81" (157 cm) Weight: 50.6 kg (111 lb 8.8 oz) IBW/kg (Calculated) : 49.67 TPN AdjBW (KG): 50 Body mass index is 20.53 kg/m. Usual Weight:   Assessment:  Pharmacy is consulted to start TPN on 42 yo female with SBO. Pt initially admitted on 12/2 for treatment of pelvic abscess and then subsequently discharged. Readmitted on 12/16 and under went Cystoscopy with right ureteral stent placement (Dr. Lovena Neighbours); exploratory laparotomy with en bloc resection including total hysterectomy, right salpingo-oophorectomy, appendectomy, and rectosigmoid resection, diverting loop ileostomy, and cystoscopy. Pt subsequently developed SBO, necessitating need for TPN   Glucose / Insulin: Hx DM, most recent A1c 6.5% (02/13/22) - PTA meds held:  Metformin, empagliflozin - CBG range 142-192 in past 24 hours  - sSSI:  9 units/ 24 hrs Electrolytes:  WNL except Phos slightly elevated at 4.7 Renal: Scr < 1, BUN 8 Hepatic: LFTS, alk phos, Tbili WNL. - Albumin 2.7 - TG 227 (02/14/22) Intake / Output; MIVF: Net - 400 mL - 2100 mL UOP, 125 ml NG output, 750 mL stool, 30 mL drain output - D5 1/2 NaCl at 40 ml/hr  GI Imaging: 12/31: small-bowel obstruction. GI Surgeries / Procedures:  - 12/18 Cystoscopy with right ureteral stent placement (Dr. Lovena Neighbours); exploratory laparotomy with en bloc resection including total hysterectomy, right salpingo-oophorectomy, appendectomy, and rectosigmoid resection, diverting loop ileostomy, and cystoscopy   Central access: PICC line (1/1) TPN start date: 1/1   Nutritional Goals: Goal TPN rate is 75 mL/hr to provide 72 g of protein and 1638 kcals per day  RD Assessment: (1/1) Estimated Needs Total Energy Estimated Needs: 1500-1650 kcals Total Protein Estimated Needs: 65-75 grams Total Fluid Estimated Needs: >/= 1.5L  Current Nutrition:  NPO and TPN NG tube:  to LIS, possible clamp trial later today.   Plan:  At 18:00 Advance TPN to goal rate, 75 mL/hr at 1800 Electrolytes in TPN:  Na 65 mEq/L K 70 mEq/L Ca 3 mEq/L Mg 5 mEq/L  Phos 5 mmol/L Cl:Ac 1:1 Add standard MVI and trace elements to TPN Continue Sensitive q6h SSI and adjust as needed  MIVF to NS at 25 mL/hr at 1800  Monitor TPN labs on Mon/Thurs and as needed.   [name] PharmD, BCPS [facility] main pharmacy [phone] 02/15/2022 7:31 AM

## 2022-02-16 LAB — COMPREHENSIVE METABOLIC PANEL
ALT: 14 U/L (ref 0–44)
AST: 20 U/L (ref 15–41)
Albumin: 3.5 g/dL (ref 3.5–5.0)
Alkaline Phosphatase: 107 U/L (ref 38–126)
Anion gap: 9 (ref 5–15)
BUN: 15 mg/dL (ref 6–20)
CO2: 25 mmol/L (ref 22–32)
Calcium: 9.4 mg/dL (ref 8.9–10.3)
Chloride: 104 mmol/L (ref 98–111)
Creatinine, Ser: 0.62 mg/dL (ref 0.44–1.00)
GFR, Estimated: >60 mL/min (ref >60)
Glucose, Bld: 160 mg/dL — ABNORMAL HIGH (ref 70–99)
Potassium: 4.2 mmol/L (ref 3.5–5.1)
Sodium: 138 mmol/L (ref 135–145)
Total Bilirubin: 0.3 mg/dL (ref 0.3–1.2)
Total Protein: 7.4 g/dL (ref 6.5–8.1)

## 2022-02-16 LAB — TRIGLYCERIDES: Triglycerides: 281 mg/dL — ABNORMAL HIGH (ref <150)

## 2022-02-16 LAB — GLUCOSE, CAPILLARY
Glucose-Capillary: 152 mg/dL — ABNORMAL HIGH (ref 70–99)
Glucose-Capillary: 159 mg/dL — ABNORMAL HIGH (ref 70–99)
Glucose-Capillary: 162 mg/dL — ABNORMAL HIGH (ref 70–99)
Glucose-Capillary: 165 mg/dL — ABNORMAL HIGH (ref 70–99)
Glucose-Capillary: 166 mg/dL — ABNORMAL HIGH (ref 70–99)
Glucose-Capillary: 173 mg/dL — ABNORMAL HIGH (ref 70–99)

## 2022-02-16 LAB — PHOSPHORUS: Phosphorus: 5.1 mg/dL — ABNORMAL HIGH (ref 2.5–4.6)

## 2022-02-16 LAB — MAGNESIUM: Magnesium: 2.2 mg/dL (ref 1.7–2.4)

## 2022-02-16 MED ORDER — TRAVASOL 10 % IV SOLN
INTRAVENOUS | Status: AC
  Filled 2022-02-16: qty 720

## 2022-02-16 NOTE — Progress Notes (Signed)
Asked pt if she would like the scheduled 0600 dose of Dilaudid. She stated that she wasn't sure right now, that she would let the nurse know. Encouraged her to let the nurse know before 7am, if she would like it. She stated that she would call out and let the nurse know, if she decides to take it. Nurse stated okay.

## 2022-02-16 NOTE — Progress Notes (Addendum)
17 Days Post-Op Procedure(s) (LRB): OPEN unilateral SALPINGO OOPHORECTOMY,  hysterectomy  rectosigmoid resection and ileostomy (N/A) CYSTOSCOPY WITH RETROGRADE PYELOGRAM/URETERAL STENT PLACEMENT (Right)  Subjective: Patient reports doing well this am. Denies nausea and emesis.  Continues to void freely. Ambulating without difficulty and feels steady. Patient reporting flatus in the ostomy bag with liquid output. Still has intermittent abdominal cramping (pulling sensation) but reports this as mild. No needs voiced.  Objective: Vital signs in last 24 hours: Temp:  [98 F (36.7 C)-98.7 F (37.1 C)] 98 F (36.7 C) (01/04 0424) Pulse Rate:  [83-90] 83 (01/04 0424) Resp:  [16-18] 16 (01/04 0424) BP: (149-153)/(97-106) 150/100 (01/04 0424) SpO2:  [97 %-99 %] 97 % (01/04 0424) Last BM Date :  (has liquid brown stool in appliance bag of ostomy)  Intake/Output from previous day: 01/03 0701 - 01/04 0700 In: 2423.4 [I.V.:2423.4] Out: 2550 [Urine:1700; Emesis/NG output:125; Drains:50; Stool:675]  Physical Examination: General: Alert, oriented, no acute distress.  HEENT: Normocephalic, atraumatic. Sclera anicteric.  Chest: Clear to auscultation bilaterally. No wheezes, rhonchi, or rales. Cardiovascular: regular rate and rhythm, no murmurs, rubs, or gallops.  Abdomen: soft, mildly distended-less than 1/3, hypoactive bowels sounds, more active in lower abdomen with no tinkling appreciated, nontender to palpation. Incision is intact with steri strips. Ostomy bag with liquid brown output with flatus in the bag, stoma pink and viable.  Right lower quadrant drain with minimal amount of serous fluid in bulb.  Extremities: Grossly normal range of motion. Warm, well perfused. No edema bilaterally.  NG tube with clear drainage.  Labs:    Latest Ref Rng & Units 02/15/2022    8:00 AM 02/14/2022    3:55 AM 02/13/2022    2:08 AM  CBC  WBC 4.0 - 10.5 K/uL 9.7  8.1  11.6   Hemoglobin 12.0 - 15.0 g/dL 9.6  9.1   9.5   Hematocrit 36.0 - 46.0 % 30.4  29.5  29.9   Platelets 150 - 400 K/uL 651  637  617       Latest Ref Rng & Units 02/16/2022    4:46 AM 02/15/2022    2:29 AM 02/14/2022    3:55 AM  BMP  Glucose 70 - 99 mg/dL 160  147  155   BUN 6 - 20 mg/dL 15  8  <5   Creatinine 0.44 - 1.00 mg/dL 0.62  0.61  0.65   Sodium 135 - 145 mmol/L 138  135  136   Potassium 3.5 - 5.1 mmol/L 4.2  3.6  3.2   Chloride 98 - 111 mmol/L 104  102  102   CO2 22 - 32 mmol/L '25  24  25   '$ Calcium 8.9 - 10.3 mg/dL 9.4  8.8  8.7    Xray 12/31: Dilated loops of small bowel in the left upper quadrant and epigastric area measuring up to 3.8 cm in diameter, findings compatible with small-bowel obstruction. Consider further evaluation with CT abdomen pelvis with contrast versus ingestion of radiopaque contrast material and subsequent small-bowel follow-through radiograph series.  8hr post contrast xray: Persistent dilatation of central and left upper quadrant bowel loops, not much interval change. Potentially dilute contrast within dilated small bowel. No additional contrast visible in the colon or in the region of the right lower quadrant ostomy. There is previous contrast within the right colon that is similar in distribution as compared with the radiograph from earlier today.  Xray after NGT placement: Nasogastric tube tip and side port project within the  stomach. There is contrast material within the ascending colon. No dilated small bowel.   Assessment: 42 y.o. s/p Procedure(s): OPEN unilateral SALPINGO OOPHORECTOMY,  hysterectomy  rectosigmoid resection and ileostomy, CYSTOSCOPY WITH RETROGRADE PYELOGRAM/URETERAL STENT PLACEMENT:  some GI dysfunction again in the setting of pelvic inflammation post-op, concern for partial obstruction.   Post-op: Slow recovery as anticipated from large surgery with weeks of intra-abdominal infection. Plan for NG clamping trial today.    Partial SBO: Dilated loops of small bowel on x-ray  12/31.  Contrast given for small bowel follow-through.  Some contrast seen in the small bowel at time of follow-up x-ray.  Given multiple episodes of emesis on 12/31, NG tube ordered.  Good output from ostomy and significant improvement in both her abdominal cramping and distention. Plan for possible trial of NG tube clamping today per Dr. Berline Lopes.  Given no p.o. intake over the last several days and decreased intake prior to that after resolution of postoperative ileus, TPN initiated. Drain with serous output. Will dc prior to discharge.   Hypertension: Initially suspected to be in the setting of poorly controlled pain.  Improved with improved pain control. PO antihypertensive started 12/29, dose increased 12/30. Has prn IV labetalol ordered.   Upper GI bleed: NGT output + for heme on 12/24.  Discussed with GI. PPI changed to BID. H&H stable. No evidence of ongoing bleeding. Appreciated GI consult. Hold lovenox and NSAIDs.   ID: Vancomycin discontinued 12/24 given AKI. Zosyn discontinued on 12/28. Mild leukocytosis improving, suspect in the setting of pSBO.   Endo: Continue sliding scale for hyperglycemia in the setting of her diabetes. Home metformin held again since npo. CBG (last 3)  Recent Labs    02/15/22 2352 02/16/22 0416 02/16/22 0722  GLUCAP 178* 159* 162*      Prophylaxis: Continue SCDs while in bed, ambulation. Lovenox held starting 12/24 given concern for upper GI bleed. Consideration of starting aspirin given thrombocytosis in post-op setting.   Plan: Trial of NG tube clamping today. Check residual in 4-6 hours. Encouraged increasing mobility Continue plan of care per Dr. Berline Lopes Dispo:  Discharge plan to include : home health. The patient is to be discharged to home.   LOS: 19 days    Dorothyann Gibbs 02/16/2022, 7:26 AM

## 2022-02-16 NOTE — Progress Notes (Addendum)
PHARMACY - TOTAL PARENTERAL NUTRITION CONSULT NOTE   Indication: Small bowel obstruction  Patient Measurements: Height: 5' 1.81" (157 cm) Weight: 50.6 kg (111 lb 8.8 oz) IBW/kg (Calculated) : 49.67 TPN AdjBW (KG): 50 Body mass index is 20.53 kg/m. Usual Weight:   Assessment:  Pharmacy is consulted to start TPN on 42 yo female with SBO. Pt initially admitted on 12/2 for treatment of pelvic abscess and then subsequently discharged. Readmitted on 12/16 and under went Cystoscopy with right ureteral stent placement (Dr. Lovena Neighbours); exploratory laparotomy with en bloc resection including total hysterectomy, right salpingo-oophorectomy, appendectomy, and rectosigmoid resection, diverting loop ileostomy, and cystoscopy. Pt subsequently developed SBO, necessitating need for TPN   Glucose / Insulin: Hx DM, most recent A1c 6.5% (02/13/22) - PTA meds held:  Metformin, empagliflozin - CBG range 144 - 178 in past 24 hours  - sSSI:  11 units/ 24 hrs Electrolytes:  WNL except Phos remains slightly elevated at 5.1 after phos reduced yesterday Renal: Scr < 1, BUN WNL Hepatic: LFTS, alk phos, Tbili WNL. - Albumin 2.7 - TG 281 (02/16/22) Intake / Output; MIVF: Net - 400 mL - 1700 mL UOP, 125 ml NG output, 67 mL stool, 50 mL drain output NS at 25 ml/hr  GI Imaging: 12/31: small-bowel obstruction. GI Surgeries / Procedures:  - 12/18 Cystoscopy with right ureteral stent placement (Dr. Lovena Neighbours); exploratory laparotomy with en bloc resection including total hysterectomy, right salpingo-oophorectomy, appendectomy, and rectosigmoid resection, diverting loop ileostomy, and cystoscopy   Central access: PICC line (1/1) TPN start date: 1/1   Nutritional Goals: Goal TPN rate is 75 mL/hr to provide 72 g of protein and 1638 kcals per day  RD Assessment: (1/1) Estimated Needs Total Energy Estimated Needs: 1500-1650 kcals Total Protein Estimated Needs: 65-75 grams Total Fluid Estimated Needs: >/= 1.5L  Current  Nutrition:  NPO and TPN NG tube: to LIS, possible clamp trial today.   Plan:  Continue TPN @ goal rate = 75 mL/hr Electrolytes in TPN:  Na 65 mEq/L K 70 mEq/L Ca 3 mEq/L Mg 5 mEq/L  Phos 0 mmol/L Cl:Ac 1:1 Add standard MVI and trace elements to TPN Continue Sensitive q6h SSI and adjust as needed  MIVF: NS at 25 mL/hr   Monitor TPN labs on Mon/Thurs. BMET, Mg & Phos in AM.   Eudelia Bunch, Pharm.D Use secure chat for questions 02/16/2022 7:48 AM

## 2022-02-17 LAB — CBC WITH DIFFERENTIAL/PLATELET
Abs Immature Granulocytes: 0.07 10*3/uL (ref 0.00–0.07)
Basophils Absolute: 0 10*3/uL (ref 0.0–0.1)
Basophils Relative: 0 %
Eosinophils Absolute: 0 10*3/uL (ref 0.0–0.5)
Eosinophils Relative: 0 %
HCT: 30.4 % — ABNORMAL LOW (ref 36.0–46.0)
Hemoglobin: 9.6 g/dL — ABNORMAL LOW (ref 12.0–15.0)
Immature Granulocytes: 1 %
Lymphocytes Relative: 29 %
Lymphs Abs: 3 10*3/uL (ref 0.7–4.0)
MCH: 25.9 pg — ABNORMAL LOW (ref 26.0–34.0)
MCHC: 31.6 g/dL (ref 30.0–36.0)
MCV: 81.9 fL (ref 80.0–100.0)
Monocytes Absolute: 0.7 10*3/uL (ref 0.1–1.0)
Monocytes Relative: 7 %
Neutro Abs: 6.4 10*3/uL (ref 1.7–7.7)
Neutrophils Relative %: 63 %
Platelets: 520 10*3/uL — ABNORMAL HIGH (ref 150–400)
RBC: 3.71 MIL/uL — ABNORMAL LOW (ref 3.87–5.11)
RDW: 16.3 % — ABNORMAL HIGH (ref 11.5–15.5)
WBC: 10.2 10*3/uL (ref 4.0–10.5)
nRBC: 0 % (ref 0.0–0.2)

## 2022-02-17 LAB — IRON AND TIBC
Iron: 31 ug/dL (ref 28–170)
Saturation Ratios: 10 % — ABNORMAL LOW (ref 10.4–31.8)
TIBC: 301 ug/dL (ref 250–450)
UIBC: 270 ug/dL

## 2022-02-17 LAB — FERRITIN: Ferritin: 99 ng/mL (ref 11–307)

## 2022-02-17 LAB — BASIC METABOLIC PANEL
Anion gap: 8 (ref 5–15)
BUN: 17 mg/dL (ref 6–20)
CO2: 26 mmol/L (ref 22–32)
Calcium: 9.2 mg/dL (ref 8.9–10.3)
Chloride: 104 mmol/L (ref 98–111)
Creatinine, Ser: 0.57 mg/dL (ref 0.44–1.00)
GFR, Estimated: >60 mL/min (ref >60)
Glucose, Bld: 169 mg/dL — ABNORMAL HIGH (ref 70–99)
Potassium: 3.7 mmol/L (ref 3.5–5.1)
Sodium: 138 mmol/L (ref 135–145)

## 2022-02-17 LAB — GLUCOSE, CAPILLARY
Glucose-Capillary: 171 mg/dL — ABNORMAL HIGH (ref 70–99)
Glucose-Capillary: 178 mg/dL — ABNORMAL HIGH (ref 70–99)
Glucose-Capillary: 169 mg/dL — ABNORMAL HIGH (ref 70–99)
Glucose-Capillary: 166 mg/dL — ABNORMAL HIGH (ref 70–99)
Glucose-Capillary: 154 mg/dL — ABNORMAL HIGH (ref 70–99)

## 2022-02-17 LAB — MAGNESIUM: Magnesium: 1.8 mg/dL (ref 1.7–2.4)

## 2022-02-17 LAB — PHOSPHORUS: Phosphorus: 3.6 mg/dL (ref 2.5–4.6)

## 2022-02-17 LAB — TRANSFERRIN: Transferrin: 215 mg/dL (ref 192–382)

## 2022-02-17 MED ORDER — TRAVASOL 10 % IV SOLN
INTRAVENOUS | Status: AC
  Filled 2022-02-17: qty 384

## 2022-02-17 MED ORDER — HYDROMORPHONE HCL 1 MG/ML IJ SOLN: 0.5000 mg | INTRAMUSCULAR | Status: DC | PRN

## 2022-02-17 MED ORDER — SODIUM CHLORIDE 0.9 % IV SOLN
400.0000 mg | Freq: Once | INTRAVENOUS | Status: AC
  Administered 2022-02-17: 400 mg via INTRAVENOUS
  Filled 2022-02-17: qty 20

## 2022-02-17 NOTE — Consult Note (Signed)
[place] Nurse ostomy follow up Stoma type/location: RLQ ileostomy Stomal assessment/size:  1 1/2" loop ileostomy Peristomal assessment:  midline incision well approximated with a few remaining steri-strips in place    Treatment options for stomal/peristomal skin: barrier ring due to dip in abdominal place from 3 to 7 o'clock to promote seal Output 50 mls green liquid stool  Ostomy pouching: 2pc.  2 1/4" pouch with barrier ring Patient able to perform entire pouch change including removal old pouch, cleansing skin around ostomy, cutting new skin barrier, applying barrier ring and skin barrier to ostomy and attaching new pouch. Stoma has decreased in size (from 1 3/4" to 1 1/2").  Discussed this with patient and traced the next skin barrier for her to cut next appliance change to accommodate for this. Stoma did bleed slightly after cleaning and discussed with patient that this is a normal occurrence.  Patient able to demonstrate use of lock and roll closure system and verbalizes how to clean ostomy spout after emptying.  Patient no longer has NG tube, is eating clear liquids and hopes to be discharged next week.   Educational materials and 5 sets of wafers/barrier rings, and pouches left in the room. Use supplies:  barrier rings Kellie Simmering [phone], wafer Kellie Simmering #[phone] and pouch Kellie Simmering #[phone]  Enrolled patient in [place] Start Discharge program: Yes [place] team will  follow.   Thanks,  [employer] MSN, RN-BC, [employer]

## 2022-02-17 NOTE — Progress Notes (Signed)
18 Days Post-Op Procedure(s) (LRB): OPEN unilateral SALPINGO OOPHORECTOMY,  hysterectomy  rectosigmoid resection and ileostomy (N/A) CYSTOSCOPY WITH RETROGRADE PYELOGRAM/URETERAL STENT PLACEMENT (Right)  Subjective: Patient reports doing well this am. Denies nausea and emesis. No pain reported. Continues to void freely. Ambulating without difficulty and feels steady. Pain experienced previously with ambulating farther distances has improved. Patient reporting flatus in the ostomy bag with liquid output. Tolerating sips of clears. No needs voiced.  Objective: Vital signs in last 24 hours: Temp:  [98 F (36.7 C)-98.7 F (37.1 C)] 98.7 F (37.1 C) (01/05 1125) Pulse Rate:  [85-93] 93 (01/05 1125) Resp:  [16-18] 18 (01/05 1125) BP: (128-147)/(89-106) 141/95 (01/05 1125) SpO2:  [96 %-100 %] 98 % (01/05 1125) Last BM Date : 02/17/22  Intake/Output from previous day: 01/04 0701 - 01/05 0700 In: 2641.6 [P.O.:150; I.V.:2491.6] Out: 2065 [Urine:1700; Emesis/NG output:50; Drains:15; Stool:300]  Physical Examination: General: Alert, oriented, no acute distress.  HEENT: Normocephalic, atraumatic. Sclera anicteric.  Chest: Clear to auscultation bilaterally. No wheezes, rhonchi, or rales. Cardiovascular: regular rate and rhythm, no murmurs, rubs, or gallops.  Abdomen: soft, active bowels sounds, more active in lower abdomen with no tinkling appreciated, nontender to palpation. Midline abdominal incision is intact. Ostomy bag with liquid brown output with flatus in the bag, stoma pink and viable.  Right lower quadrant drain with minimal amount of serous fluid in bulb.  Extremities: Grossly normal range of motion. Warm, well perfused. No edema bilaterally.   Labs:    Latest Ref Rng & Units 02/15/2022    8:00 AM 02/14/2022    3:55 AM 02/13/2022    2:08 AM  CBC  WBC 4.0 - 10.5 K/uL 9.7  8.1  11.6   Hemoglobin 12.0 - 15.0 g/dL 9.6  9.1  9.5   Hematocrit 36.0 - 46.0 % 30.4  29.5  29.9   Platelets 150  - 400 K/uL 651  637  617       Latest Ref Rng & Units 02/17/2022    2:31 AM 02/16/2022    4:46 AM 02/15/2022    2:29 AM  BMP  Glucose 70 - 99 mg/dL 169  160  147   BUN 6 - 20 mg/dL '17  15  8   '$ Creatinine 0.44 - 1.00 mg/dL 0.57  0.62  0.61   Sodium 135 - 145 mmol/L 138  138  135   Potassium 3.5 - 5.1 mmol/L 3.7  4.2  3.6   Chloride 98 - 111 mmol/L 104  104  102   CO2 22 - 32 mmol/L '26  25  24   '$ Calcium 8.9 - 10.3 mg/dL 9.2  9.4  8.8    Xray 12/31: Dilated loops of small bowel in the left upper quadrant and epigastric area measuring up to 3.8 cm in diameter, findings compatible with small-bowel obstruction. Consider further evaluation with CT abdomen pelvis with contrast versus ingestion of radiopaque contrast material and subsequent small-bowel follow-through radiograph series.  8hr post contrast xray: Persistent dilatation of central and left upper quadrant bowel loops, not much interval change. Potentially dilute contrast within dilated small bowel. No additional contrast visible in the colon or in the region of the right lower quadrant ostomy. There is previous contrast within the right colon that is similar in distribution as compared with the radiograph from earlier today.  Xray after NGT placement: Nasogastric tube tip and side port project within the stomach. There is contrast material within the ascending colon. No dilated small bowel.  Assessment: 42 y.o. s/p Procedure(s): OPEN unilateral SALPINGO OOPHORECTOMY,  hysterectomy  rectosigmoid resection and ileostomy, CYSTOSCOPY WITH RETROGRADE PYELOGRAM/URETERAL STENT PLACEMENT:  GI dysfunction improving in the setting of pelvic inflammation post-op, concern for partial obstruction.   Post-op: Slow recovery as anticipated from large surgery with weeks of intra-abdominal infection. Tolerated NG tube clamping on 02/16/22 with NG being removed the afternoon of 02/16/22.   Partial SBO: Dilated loops of small bowel on x-ray 12/31.  Contrast  given for small bowel follow-through.  Some contrast seen in the small bowel at time of follow-up x-ray.  Given multiple episodes of emesis on 12/31, NG tube ordered.  Good output from ostomy and significant improvement in both her abdominal cramping and distention. NG tube discontinued on 02/16/22 after tolerating clamping trial throughout the day. Tolerating sips of clears.   Given no p.o. intake over the last several days and decreased intake prior to that after resolution of postoperative ileus, TPN initiated. TPN is being weaned with dietary increase. Currently on sips of clears. Drain with minimal serous output.    Hypertension: Initially suspected to be in the setting of poorly controlled pain. PO antihypertensive started 12/29, dose increased 12/30. Has prn IV labetalol ordered. Plan for discharge home on antihypertensive.   Upper GI bleed: NGT output + for heme on 12/24.  Discussed with GI. PPI changed to BID. H&H stable. No evidence of ongoing bleeding. Appreciated GI consult. Hold lovenox and NSAIDs.   ID: Vancomycin discontinued 12/24 given AKI. Zosyn discontinued on 12/28. Mild leukocytosis improving, suspect in the setting of pSBO. Awaiting CBC results from this am.  Heme: Thrombocytosis discussed with Dr. Alvy Bimler and Dr. Berline Lopes. Recommendation for IV iron per Dr. Alvy Bimler. Iron labs have been ordered. Iron sucrose 400 mg IV dose per Dr. Alvy Bimler ordered. Spoke with pharmacy about anaphylaxis protocol if needed with administration and advised there is no order set for inpatient and the RN should notify rapid response and the primary team.   Endo: Continue sliding scale for hyperglycemia in the setting of her diabetes. Home metformin held again since npo. CBG (last 3)  Recent Labs    02/17/22 0350 02/17/22 0727 02/17/22 1123  GLUCAP 171* 178* 169*      Prophylaxis: Continue SCDs while in bed, ambulation. Lovenox held starting 12/24 given concern for upper GI bleed.    Plan: Plan to  decrease TPN rates by half this pm JP drain removal Continue with sipping on clear liquids. Plan for diet advancement slowly Labs ordered Encouraged increasing mobility Continue plan of care per Dr. Berline Lopes Dispo:  Discharge plan to include : home health. The patient is to be discharged to home.   LOS: 20 days    Dorothyann Gibbs 02/17/2022, 11:49 AM

## 2022-02-17 NOTE — Progress Notes (Signed)
GYN Oncology  Dr. Lahoma Crocker will be rounding the weekend of January 6-7, 2024. Contact her via cell at [phone] for any patient needs.

## 2022-02-17 NOTE — Progress Notes (Signed)
PHARMACY - TOTAL PARENTERAL NUTRITION CONSULT NOTE   Indication: Small bowel obstruction  Patient Measurements: Height: 5' 1.81" (157 cm) Weight: 50.6 kg (111 lb 8.8 oz) IBW/kg (Calculated) : 49.67 TPN AdjBW (KG): 50 Body mass index is 20.53 kg/m. Usual Weight:   Assessment:  Pharmacy is consulted to start TPN on 42 yo female with SBO. Pt initially admitted on 12/2 for treatment of pelvic abscess and then subsequently discharged. Readmitted on 12/16 and under went Cystoscopy with right ureteral stent placement (Dr. Lovena Neighbours); exploratory laparotomy with en bloc resection including total hysterectomy, right salpingo-oophorectomy, appendectomy, and rectosigmoid resection, diverting loop ileostomy, and cystoscopy. Pt subsequently developed SBO, necessitating need for TPN   Glucose / Insulin: Hx DM, most recent A1c 6.5% (02/13/22) - PTA meds held:  Metformin, empagliflozin - CBG range 152-178 in past 24 hours  - sSSI:  12 units/ 24 hrs Electrolytes:  WNL - Phos returned to WNL after Phos removed, K and Mag on low end but WNL Renal: Scr < 1, BUN WNL Hepatic: LFTS, alk phos, Tbili WNL. - Albumin 3.5 - TG 281 (02/16/22) Intake / Output; MIVF: Net +576 mL - Output UOP adequate at 1700 mL, NG down to 50 ml, Stool 300 mL, Drains down to 15 mL - mIVF:  NS at 25 ml/hr  GI Imaging: 12/31: small-bowel obstruction. GI Surgeries / Procedures:  - 12/18 Cystoscopy with right ureteral stent placement (Dr. Lovena Neighbours); exploratory laparotomy with en bloc resection including total hysterectomy, right salpingo-oophorectomy, appendectomy, and rectosigmoid resection, diverting loop ileostomy, and cystoscopy   Central access: PICC line (1/1) TPN start date: 1/1   Nutritional Goals: Goal TPN rate is 75 mL/hr to provide 72 g of protein and 1638 kcals per day  RD Assessment: (1/1) Estimated Needs Total Energy Estimated Needs: 1500-1650 kcals Total Protein Estimated Needs: 65-75 grams Total Fluid Estimated  Needs: >/= 1.5L  Current Nutrition:  Clear liquids and TPN NG tube removed 1/4  Plan:  Reduce to ~Half rate TPN at 40 mL/hr  Electrolytes in TPN:  Na 65 mEq/L K 70 mEq/L Ca 3 mEq/L Mg 5 mEq/L  Phos 0 mmol/L Cl:Ac 1:1 Add standard MVI and trace elements to TPN  Continue Sensitive q6h SSI and adjust as needed  mIVF: NS at 25 mL/hr   Monitor TPN labs on Mon/Thurs.   [name] PharmD, BCPS [facility] main pharmacy [phone] 02/17/2022 7:56 AM

## 2022-02-18 LAB — COMPREHENSIVE METABOLIC PANEL
ALT: 23 U/L (ref 0–44)
AST: 30 U/L (ref 15–41)
Albumin: 3.1 g/dL — ABNORMAL LOW (ref 3.5–5.0)
Alkaline Phosphatase: 125 U/L (ref 38–126)
Anion gap: 7 (ref 5–15)
BUN: 16 mg/dL (ref 6–20)
CO2: 26 mmol/L (ref 22–32)
Calcium: 9.2 mg/dL (ref 8.9–10.3)
Chloride: 106 mmol/L (ref 98–111)
Creatinine, Ser: 0.63 mg/dL (ref 0.44–1.00)
GFR, Estimated: >60 mL/min (ref >60)
Glucose, Bld: 132 mg/dL — ABNORMAL HIGH (ref 70–99)
Potassium: 3.6 mmol/L (ref 3.5–5.1)
Sodium: 139 mmol/L (ref 135–145)
Total Bilirubin: 0.5 mg/dL (ref 0.3–1.2)
Total Protein: 7 g/dL (ref 6.5–8.1)

## 2022-02-18 LAB — CBC
HCT: 31.3 % — ABNORMAL LOW (ref 36.0–46.0)
Hemoglobin: 9.7 g/dL — ABNORMAL LOW (ref 12.0–15.0)
MCH: 25.7 pg — ABNORMAL LOW (ref 26.0–34.0)
MCHC: 31 g/dL (ref 30.0–36.0)
MCV: 82.8 fL (ref 80.0–100.0)
Platelets: 560 10*3/uL — ABNORMAL HIGH (ref 150–400)
RBC: 3.78 MIL/uL — ABNORMAL LOW (ref 3.87–5.11)
RDW: 16.5 % — ABNORMAL HIGH (ref 11.5–15.5)
WBC: 9.3 10*3/uL (ref 4.0–10.5)
nRBC: 0 % (ref 0.0–0.2)

## 2022-02-18 LAB — GLUCOSE, CAPILLARY
Glucose-Capillary: 117 mg/dL — ABNORMAL HIGH (ref 70–99)
Glucose-Capillary: 155 mg/dL — ABNORMAL HIGH (ref 70–99)
Glucose-Capillary: 169 mg/dL — ABNORMAL HIGH (ref 70–99)
Glucose-Capillary: 174 mg/dL — ABNORMAL HIGH (ref 70–99)
Glucose-Capillary: 184 mg/dL — ABNORMAL HIGH (ref 70–99)
Glucose-Capillary: 188 mg/dL — ABNORMAL HIGH (ref 70–99)

## 2022-02-18 MED ORDER — TRAVASOL 10 % IV SOLN
INTRAVENOUS | Status: AC
  Filled 2022-02-18: qty 384

## 2022-02-18 MED ORDER — ADULT MULTIVITAMIN W/MINERALS CH
1.0000 | ORAL_TABLET | Freq: Every day | ORAL | Status: DC
  Administered 2022-02-18 – 2022-02-21 (×4): 1 via ORAL
  Filled 2022-02-18 (×4): qty 1

## 2022-02-18 NOTE — Progress Notes (Signed)
PHARMACY - TOTAL PARENTERAL NUTRITION CONSULT NOTE   Indication: Small bowel obstruction  Patient Measurements: Height: 5' 1.81" (157 cm) Weight: 50.6 kg (111 lb 8.8 oz) IBW/kg (Calculated) : 49.67 TPN AdjBW (KG): 50 Body mass index is 20.53 kg/m. Usual Weight:   Assessment:  Pharmacy is consulted to start TPN on 42 yo female with SBO. Pt initially admitted on 12/2 for treatment of pelvic abscess and then subsequently discharged. Readmitted on 12/16 and under went Cystoscopy with right ureteral stent placement (Dr. Lovena Neighbours); exploratory laparotomy with en bloc resection including total hysterectomy, right salpingo-oophorectomy, appendectomy, and rectosigmoid resection, diverting loop ileostomy, and cystoscopy. Pt subsequently developed SBO, necessitating need for TPN   Glucose / Insulin: Hx DM, most recent A1c 6.5% (02/13/22) - PTA meds held:  Metformin, empagliflozin - CBG range 154-174 in past 24 hours  - sSSI:  11 units/ 24 hrs Electrolytes:  WNL Renal: Scr < 1, BUN WNL Hepatic: LFTS, alk phos, Tbili WNL. - Albumin 3.5 - TG 281 (02/16/22) Intake / Output; MIVF:  - UOP not fully charted; Stool output up to 550 mL - mIVF:  NS at 25 ml/hr  GI Imaging: 12/31: small-bowel obstruction. GI Surgeries / Procedures:  - 12/18 Cystoscopy with right ureteral stent placement (Dr. Lovena Neighbours); exploratory laparotomy with en bloc resection including total hysterectomy, right salpingo-oophorectomy, appendectomy, and rectosigmoid resection, diverting loop ileostomy, and cystoscopy   Central access: PICC line (1/1) TPN start date: 1/1   Nutritional Goals: Goal TPN rate is 75 mL/hr to provide 72 g of protein and 1638 kcals per day  RD Assessment: (1/1) Estimated Needs Total Energy Estimated Needs: 1500-1650 kcals Total Protein Estimated Needs: 65-75 grams Total Fluid Estimated Needs: >/= 1.5L  Current Nutrition:  Clear liquids and TPN NG tube removed 1/4  Plan:  Continue half rate TPN at 40  mL/hr  Electrolytes in TPN:  Na 65 mEq/L K 80 mEq/L Ca 3 mEq/L Mg 5 mEq/L  Phos 5 mmol/L Cl:Ac 1:1 Change MVI and trace elements to oral  Continue Sensitive q6h SSI and adjust as needed  mIVF: NS at 25 mL/hr   Monitor TPN labs on Mon/Thurs and PRN.  [name] PharmD, BCPS [facility] main pharmacy [phone] 02/18/2022 9:21 AM

## 2022-02-18 NOTE — Progress Notes (Signed)
19 Days Post-Op Procedure(s) (LRB): OPEN unilateral SALPINGO OOPHORECTOMY,  hysterectomy  rectosigmoid resection and ileostomy (N/A) CYSTOSCOPY WITH RETROGRADE PYELOGRAM/URETERAL STENT PLACEMENT (Right)  Subjective: No N/V, pain  Objective: Vital signs in last 24 hours: Temp:  [98.5 F (36.9 C)-98.7 F (37.1 C)] 98.5 F (36.9 C) (01/06 0418) Pulse Rate:  [91-93] 91 (01/06 0418) Resp:  [16-18] 16 (01/06 0418) BP: (136-144)/(95-97) 136/96 (01/06 0418) SpO2:  [98 %-100 %] 100 % (01/06 0418) Last BM Date : 02/17/22  Intake/Output from previous day: 01/05 0701 - 01/06 0700 In: 2259.6 [I.V.:1949.4; IV Piggyback:310.2] Out: 1150 [Urine:600; Stool:550]  Physical Examination: General: Alert, oriented, no acute distress.  HEENT: Normocephalic, atraumatic. Sclera anicteric.  Chest: Clear to auscultation bilaterally. No wheezes, rhonchi, or rales. Cardiovascular: regular rate and rhythm, no murmurs, rubs, or gallops.  Abdomen: soft, normoactive bowels sounds. Ostomy bag with liquid brown output with flatus in the bag, stoma pink and viable.    Extremities: Grossly normal range of motion. Warm, well perfused. No edema bilaterally.   Labs:    Latest Ref Rng & Units 02/18/2022    4:10 AM 02/17/2022   12:34 PM 02/15/2022    8:00 AM  CBC  WBC 4.0 - 10.5 K/uL 9.3  10.2  9.7   Hemoglobin 12.0 - 15.0 g/dL 9.7  9.6  9.6   Hematocrit 36.0 - 46.0 % 31.3  30.4  30.4   Platelets 150 - 400 K/uL 560  520  651       Latest Ref Rng & Units 02/18/2022    4:10 AM 02/17/2022    2:31 AM 02/16/2022    4:46 AM  BMP  Glucose 70 - 99 mg/dL 132  169  160   BUN 6 - 20 mg/dL '16  17  15   '$ Creatinine 0.44 - 1.00 mg/dL 0.63  0.57  0.62   Sodium 135 - 145 mmol/L 139  138  138   Potassium 3.5 - 5.1 mmol/L 3.6  3.7  4.2   Chloride 98 - 111 mmol/L 106  104  104   CO2 22 - 32 mmol/L '26  26  25   '$ Calcium 8.9 - 10.3 mg/dL 9.2  9.2  9.4       Assessment: 42 y.o. s/p Procedure(s): OPEN unilateral SALPINGO  OOPHORECTOMY,  hysterectomy  rectosigmoid resection and ileostomy, CYSTOSCOPY WITH RETROGRADE PYELOGRAM/URETERAL STENT PLACEMENT   Partial SBO: Continues to tolerate sips of clears.    COR: Borderline B/Ps--stable   Heme/GI: Persistent thrombocytosis--s/p IV Fe infusion.  GI bleed--stress ulcer on PPI--. H&H remain stable.    Endo: DM. CBGs in range CBG (last 3)  Recent Labs    02/18/22 0010 02/18/22 0417 02/18/22 0752  GLUCAP 155* 169* 174*      Prophylaxis: Continue SCDs while in bed, ambulation. Lovenox held starting 12/24 given concern for upper GI bleed.    Plan: >advance to full liquid diet >resume metformin when tolerating a regular diet >encouraged ambulation   LOS: 21 days    Lahoma Crocker, MD 02/18/2022, 9:58 AM

## 2022-02-19 LAB — COMPREHENSIVE METABOLIC PANEL
ALT: 27 U/L (ref 0–44)
AST: 29 U/L (ref 15–41)
Albumin: 2.8 g/dL — ABNORMAL LOW (ref 3.5–5.0)
Alkaline Phosphatase: 100 U/L (ref 38–126)
Anion gap: 7 (ref 5–15)
BUN: 15 mg/dL (ref 6–20)
CO2: 24 mmol/L (ref 22–32)
Calcium: 9 mg/dL (ref 8.9–10.3)
Chloride: 106 mmol/L (ref 98–111)
Creatinine, Ser: 0.6 mg/dL (ref 0.44–1.00)
GFR, Estimated: >60 mL/min (ref >60)
Glucose, Bld: 155 mg/dL — ABNORMAL HIGH (ref 70–99)
Potassium: 3.5 mmol/L (ref 3.5–5.1)
Sodium: 137 mmol/L (ref 135–145)
Total Bilirubin: 0.3 mg/dL (ref 0.3–1.2)
Total Protein: 6.8 g/dL (ref 6.5–8.1)

## 2022-02-19 LAB — GLUCOSE, CAPILLARY
Glucose-Capillary: 108 mg/dL — ABNORMAL HIGH (ref 70–99)
Glucose-Capillary: 129 mg/dL — ABNORMAL HIGH (ref 70–99)
Glucose-Capillary: 147 mg/dL — ABNORMAL HIGH (ref 70–99)
Glucose-Capillary: 149 mg/dL — ABNORMAL HIGH (ref 70–99)
Glucose-Capillary: 156 mg/dL — ABNORMAL HIGH (ref 70–99)
Glucose-Capillary: 162 mg/dL — ABNORMAL HIGH (ref 70–99)

## 2022-02-19 LAB — CBC
HCT: 28.8 % — ABNORMAL LOW (ref 36.0–46.0)
Hemoglobin: 8.9 g/dL — ABNORMAL LOW (ref 12.0–15.0)
MCH: 25.8 pg — ABNORMAL LOW (ref 26.0–34.0)
MCHC: 30.9 g/dL (ref 30.0–36.0)
MCV: 83.5 fL (ref 80.0–100.0)
Platelets: 462 10*3/uL — ABNORMAL HIGH (ref 150–400)
RBC: 3.45 MIL/uL — ABNORMAL LOW (ref 3.87–5.11)
RDW: 16.3 % — ABNORMAL HIGH (ref 11.5–15.5)
WBC: 7.9 10*3/uL (ref 4.0–10.5)
nRBC: 0 % (ref 0.0–0.2)

## 2022-02-19 LAB — PHOSPHORUS: Phosphorus: 4.1 mg/dL (ref 2.5–4.6)

## 2022-02-19 LAB — MAGNESIUM: Magnesium: 2.2 mg/dL (ref 1.7–2.4)

## 2022-02-19 MED ORDER — PANTOPRAZOLE SODIUM 40 MG PO TBEC
40.0000 mg | DELAYED_RELEASE_TABLET | Freq: Two times a day (BID) | ORAL | Status: DC
  Administered 2022-02-19 – 2022-02-21 (×4): 40 mg via ORAL
  Filled 2022-02-19 (×4): qty 1

## 2022-02-19 MED ORDER — INSULIN ASPART 100 UNIT/ML IJ SOLN: 0.0000 [IU] | Freq: Every day | INTRAMUSCULAR | Status: DC

## 2022-02-19 MED ORDER — INSULIN ASPART 100 UNIT/ML IJ SOLN
0.0000 [IU] | Freq: Three times a day (TID) | INTRAMUSCULAR | Status: DC
  Administered 2022-02-19: 1 [IU] via SUBCUTANEOUS
  Administered 2022-02-20 (×2): 2 [IU] via SUBCUTANEOUS
  Administered 2022-02-21: 1 [IU] via SUBCUTANEOUS

## 2022-02-19 NOTE — Progress Notes (Signed)
PHARMACY - TOTAL PARENTERAL NUTRITION CONSULT NOTE   Indication: Small bowel obstruction  Patient Measurements: Height: 5' 1.81" (157 cm) Weight: 50.6 kg (111 lb 8.8 oz) IBW/kg (Calculated) : 49.67 TPN AdjBW (KG): 50 Body mass index is 20.53 kg/m.  Assessment:  Pharmacy is consulted to start TPN on 42 yo female with SBO. Pt initially admitted on 12/2 for treatment of pelvic abscess and then subsequently discharged. Readmitted on 12/16 and under went Cystoscopy with right ureteral stent placement (Dr. Lovena Neighbours); exploratory laparotomy with en bloc resection including total hysterectomy, right salpingo-oophorectomy, appendectomy, and rectosigmoid resection, diverting loop ileostomy, and cystoscopy. Pt subsequently developed SBO, necessitating need for TPN   Glucose / Insulin: Hx DM, most recent A1c 6.5% (02/13/22) - PTA meds held:  Metformin, empagliflozin - CBG range 117-188 in past 24 hours  - sSSI:  8 units/ 24 hrs Electrolytes:  WNL Renal: Scr < 1, BUN WNL Hepatic: LFTS, alk phos, Tbili WNL. - Albumin 3.5 - TG 281 (02/16/22) Intake / Output; MIVF:  - UOP 1550 mL; Stool output up to 625 mL - mIVF:  NS at 25 ml/hr  GI Imaging: 12/31: small-bowel obstruction. GI Surgeries / Procedures:  - 12/18 Cystoscopy with right ureteral stent placement (Dr. Lovena Neighbours); exploratory laparotomy with en bloc resection including total hysterectomy, right salpingo-oophorectomy, appendectomy, and rectosigmoid resection, diverting loop ileostomy, and cystoscopy   Central access: PICC line (1/1) TPN start date: 1/1   Nutritional Goals: Goal TPN rate is 75 mL/hr to provide 72 g of protein and 1638 kcals per day  RD Assessment: (1/1) Estimated Needs Total Energy Estimated Needs: 1500-1650 kcals Total Protein Estimated Needs: 65-75 grams Total Fluid Estimated Needs: >/= 1.5L  Current Nutrition:  Regular diet and TPN NG tube removed 1/4  Plan:  Per MD, ok to taper TPN to off on 1/7 as diet is advanced.  Pharmacy will sign off.  Continue Sensitive q6h SSI, IVF.  Further adjustments per MD Continue PO MVI TPN labs d/c   [name] PharmD, BCPS [facility] main pharmacy [phone] 02/19/2022 9:40 AM

## 2022-02-19 NOTE — Progress Notes (Signed)
20 Days Post-Op Procedure(s) (LRB): OPEN unilateral SALPINGO OOPHORECTOMY,  hysterectomy  rectosigmoid resection and ileostomy (N/A) CYSTOSCOPY WITH RETROGRADE PYELOGRAM/URETERAL STENT PLACEMENT (Right)  Subjective: Good po intake yesterday--soup, pudding, ice cream.  No N/V; minimal pain.  Objective: Vital signs in last 24 hours: Temp:  [98 F (36.7 C)-98.3 F (36.8 C)] 98 F (36.7 C) (01/07 0414) Pulse Rate:  [80-94] 80 (01/07 0414) Resp:  [16] 16 (01/07 0414) BP: (127-137)/(88-95) 137/88 (01/07 0414) SpO2:  [99 %-100 %] 100 % (01/07 0414) Last BM Date : 02/19/22  Intake/Output from previous day: 01/06 0701 - 01/07 0700 In: 2507.4 [P.O.:960; I.V.:1547.4] Out: 2175 [Urine:1550; Stool:625]  Physical Examination: General: Alert, oriented, no acute distress.  HEENT: Normocephalic, atraumatic. Sclera anicteric.  Chest: Clear to auscultation bilaterally. No wheezes, rhonchi, or rales. Cardiovascular: regular rate and rhythm, no murmurs, rubs, or gallops.  Abdomen: soft, NT. Ostomy bag with liquid brown output with flatus in the bag, stoma pink and viable.    Extremities: Grossly normal range of motion. Warm, well perfused. No edema bilaterally.   Labs:    Latest Ref Rng & Units 02/19/2022    3:04 AM 02/18/2022    4:10 AM 02/17/2022   12:34 PM  CBC  WBC 4.0 - 10.5 K/uL 7.9  9.3  10.2   Hemoglobin 12.0 - 15.0 g/dL 8.9  9.7  9.6   Hematocrit 36.0 - 46.0 % 28.8  31.3  30.4   Platelets 150 - 400 K/uL 462  560  520       Latest Ref Rng & Units 02/19/2022    3:04 AM 02/18/2022    4:10 AM 02/17/2022    2:31 AM  BMP  Glucose 70 - 99 mg/dL 155  132  169   BUN 6 - 20 mg/dL '15  16  17   '$ Creatinine 0.44 - 1.00 mg/dL 0.60  0.63  0.57   Sodium 135 - 145 mmol/L 137  139  138   Potassium 3.5 - 5.1 mmol/L 3.5  3.6  3.7   Chloride 98 - 111 mmol/L 106  106  104   CO2 22 - 32 mmol/L '24  26  26   '$ Calcium 8.9 - 10.3 mg/dL 9.0  9.2  9.2       Assessment: 42 y.o. s/p Procedure(s): OPEN  unilateral SALPINGO OOPHORECTOMY,  hysterectomy  rectosigmoid resection and ileostomy, CYSTOSCOPY WITH RETROGRADE PYELOGRAM/URETERAL STENT PLACEMENT   Partial SBO: Tolerating po   COR: Borderline B/Ps--stable   Heme/GI: Persistent thrombocytosis--s/p IV Fe infusion.  GI bleed--stress ulcer on PPI.  Slight downward trend in hemoglobin--2/2 phlebotomy, inflammation.    Endo: DM. CBGs in range CBG (last 3)  Recent Labs    02/19/22 0000 02/19/22 0334 02/19/22 0722  GLUCAP 129* 147* 162*      Prophylaxis: Continue SCDs while in bed, ambulation. Lovenox held starting 12/24 given concern for upper GI bleed.    Plan: >monitor H&H trend >advance to regular diet; continue to taper TPN    LOS: 22 days    Lahoma Crocker, MD 02/19/2022, 9:22 AM

## 2022-02-20 ENCOUNTER — Other Ambulatory Visit: Payer: Self-pay | Admitting: Gynecologic Oncology

## 2022-02-20 DIAGNOSIS — Z932 Ileostomy status: Secondary | ICD-10-CM

## 2022-02-20 LAB — COMPREHENSIVE METABOLIC PANEL
ALT: 25 U/L (ref 0–44)
AST: 27 U/L (ref 15–41)
Albumin: 2.7 g/dL — ABNORMAL LOW (ref 3.5–5.0)
Alkaline Phosphatase: 110 U/L (ref 38–126)
Anion gap: 8 (ref 5–15)
BUN: 13 mg/dL (ref 6–20)
CO2: 22 mmol/L (ref 22–32)
Calcium: 8.7 mg/dL — ABNORMAL LOW (ref 8.9–10.3)
Chloride: 107 mmol/L (ref 98–111)
Creatinine, Ser: 0.63 mg/dL (ref 0.44–1.00)
GFR, Estimated: >60 mL/min (ref >60)
Glucose, Bld: 140 mg/dL — ABNORMAL HIGH (ref 70–99)
Potassium: 3.3 mmol/L — ABNORMAL LOW (ref 3.5–5.1)
Sodium: 137 mmol/L (ref 135–145)
Total Bilirubin: 0.3 mg/dL (ref 0.3–1.2)
Total Protein: 6.6 g/dL (ref 6.5–8.1)

## 2022-02-20 LAB — CBC
HCT: 28.4 % — ABNORMAL LOW (ref 36.0–46.0)
Hemoglobin: 8.8 g/dL — ABNORMAL LOW (ref 12.0–15.0)
MCH: 25.9 pg — ABNORMAL LOW (ref 26.0–34.0)
MCHC: 31 g/dL (ref 30.0–36.0)
MCV: 83.5 fL (ref 80.0–100.0)
Platelets: 414 10*3/uL — ABNORMAL HIGH (ref 150–400)
RBC: 3.4 MIL/uL — ABNORMAL LOW (ref 3.87–5.11)
RDW: 16.1 % — ABNORMAL HIGH (ref 11.5–15.5)
WBC: 7.7 10*3/uL (ref 4.0–10.5)
nRBC: 0 % (ref 0.0–0.2)

## 2022-02-20 LAB — GLUCOSE, CAPILLARY
Glucose-Capillary: 159 mg/dL — ABNORMAL HIGH (ref 70–99)
Glucose-Capillary: 115 mg/dL — ABNORMAL HIGH (ref 70–99)
Glucose-Capillary: 186 mg/dL — ABNORMAL HIGH (ref 70–99)
Glucose-Capillary: 191 mg/dL — ABNORMAL HIGH (ref 70–99)

## 2022-02-20 MED ORDER — ENSURE MAX PROTEIN PO LIQD
11.0000 [oz_av] | Freq: Two times a day (BID) | ORAL | Status: DC
  Administered 2022-02-20 (×2): 11 [oz_av] via ORAL

## 2022-02-20 NOTE — Progress Notes (Signed)
Nutrition Follow-up  INTERVENTION:   -Ensure MAX Protein po BID, each supplement provides 150 kcal and 30 grams of protein   -Ileostomy handout in AVS and was reviewed with patient  NUTRITION DIAGNOSIS:   Unintentional weight loss related to chronic illness as evidenced by percent weight loss (14% weight loss in 5 months).  Improving, now on solid diet.  GOAL:   Patient will meet greater than or equal to 90% of their needs, Other (Comment) (Follow ileostomy nutrition therapy recommendations)  Progressing.  MONITOR:   PO intake, Supplement acceptance, Weight trends   ASSESSMENT:   42 y.o. woman with known history of endometriosis who developed TOA after outpatient REI procedure (transvaginal endometrioma drainage) who presented with worsening symptoms. S/p open unilateral salpingo oophorectomy, hysterectomy, rectosigmoid resection and ileostomy, and ureteral stent placement on 01/30/22.  12/15: admitted 12/25: CLD 12/26: FLD -> regular diet 12/29: CLD 1/1: NPO for bowel rest, TPN initiated 1/4: CLD 1/6: FLD 1/7: weaned off TPN, diet advanced to CHO modified  Patient now on CHO modified diet. Have ordered Ensure Max for additional protein in diet. Pt consuming 60-80% of meals.   Admission weight: 110 lbs Last weight 1/1: 111 lbs No other weights this admission.   Medications: MAG-OX, Multivitamin with minerals daily  Labs reviewed: CBGs: 108-162 Low K   Diet Order:   Diet Order             Diet Carb Modified Fluid consistency: Thin; Room service appropriate? Yes  Diet effective now                   EDUCATION NEEDS:   Education needs have been addressed  Skin:  Skin Assessment: Skin Integrity Issues: Skin Integrity Issues:: Incisions Incisions: Abdomen  Last BM:  1/8 -ileostomy  Height:   Ht Readings from Last 1 Encounters:  01/28/22 5' 1.81" (1.57 m)    Weight:   Wt Readings from Last 1 Encounters:  02/13/22 50.6 kg    BMI:  Body  mass index is 20.53 kg/m.  Estimated Nutritional Needs:   Kcal:  1500-1650 kcals  Protein:  65-75 grams  Fluid:  >/= 1.5L   Clayton Bibles, MS, RD, LDN Inpatient Clinical Dietitian Contact information available via Amion

## 2022-02-20 NOTE — Progress Notes (Addendum)
21 Days Post-Op Procedure(s) (LRB): OPEN unilateral SALPINGO OOPHORECTOMY,  hysterectomy  rectosigmoid resection and ileostomy (N/A) CYSTOSCOPY WITH RETROGRADE PYELOGRAM/URETERAL STENT PLACEMENT (Right)  Subjective: Patient reports doing well this am. Denies nausea and emesis.  Continues to void freely. Ambulating without difficulty and feels steady. Patient reporting flatus in the ostomy bag with liquid output. Reports liquid output for beginning part of the day then more formed. Intermittent abdominal cramping (pulling sensation) improved and reported as very mild. No needs voiced.  Objective: Vital signs in last 24 hours: Temp:  [98 F (36.7 C)-98.7 F (37.1 C)] 98.3 F (36.8 C) (01/08 0628) Pulse Rate:  [80-98] 80 (01/08 0628) Resp:  [14-18] 18 (01/08 0628) BP: (132-135)/(87-94) 132/87 (01/08 0628) SpO2:  [100 %] 100 % (01/08 0628) Last BM Date : 02/20/22  Intake/Output from previous day: 01/07 0701 - 01/08 0700 In: 2155.7 [P.O.:1080; I.V.:1075.7] Out: 370 [Stool:370]  Physical Examination: General: Alert, oriented, no acute distress.  HEENT: Normocephalic, atraumatic. Sclera anicteric.  Chest: Clear to auscultation bilaterally. No wheezes, rhonchi, or rales. Cardiovascular: regular rate and rhythm, no murmurs, rubs, or gallops.  Abdomen: soft, hypoactive bowels sounds, nontender to palpation. Incision is intact and healing without erythema or drainage. Ostomy bag with liquid yellowish brown output with flatus in the bag, stoma pink and viable. Previous drain site healing well. Extremities: Grossly normal range of motion. Warm, well perfused. No edema bilaterally.   Labs:    Latest Ref Rng & Units 02/20/2022    2:30 AM 02/19/2022    3:04 AM 02/18/2022    4:10 AM  CBC  WBC 4.0 - 10.5 K/uL 7.7  7.9  9.3   Hemoglobin 12.0 - 15.0 g/dL 8.8  8.9  9.7   Hematocrit 36.0 - 46.0 % 28.4  28.8  31.3   Platelets 150 - 400 K/uL 414  462  560       Latest Ref Rng & Units 02/20/2022    2:30  AM 02/19/2022    3:04 AM 02/18/2022    4:10 AM  BMP  Glucose 70 - 99 mg/dL 140  155  132   BUN 6 - 20 mg/dL '13  15  16   '$ Creatinine 0.44 - 1.00 mg/dL 0.63  0.60  0.63   Sodium 135 - 145 mmol/L 137  137  139   Potassium 3.5 - 5.1 mmol/L 3.3  3.5  3.6   Chloride 98 - 111 mmol/L 107  106  106   CO2 22 - 32 mmol/L '22  24  26   '$ Calcium 8.9 - 10.3 mg/dL 8.7  9.0  9.2    Xray 12/31: Dilated loops of small bowel in the left upper quadrant and epigastric area measuring up to 3.8 cm in diameter, findings compatible with small-bowel obstruction. Consider further evaluation with CT abdomen pelvis with contrast versus ingestion of radiopaque contrast material and subsequent small-bowel follow-through radiograph series.  8hr post contrast xray: Persistent dilatation of central and left upper quadrant bowel loops, not much interval change. Potentially dilute contrast within dilated small bowel. No additional contrast visible in the colon or in the region of the right lower quadrant ostomy. There is previous contrast within the right colon that is similar in distribution as compared with the radiograph from earlier today.  Xray after NGT placement: Nasogastric tube tip and side port project within the stomach. There is contrast material within the ascending colon. No dilated small bowel.   Assessment: 42 y.o. s/p Procedure(s): OPEN unilateral SALPINGO OOPHORECTOMY,  hysterectomy, rectosigmoid resection and ileostomy, CYSTOSCOPY WITH RETROGRADE PYELOGRAM/URETERAL STENT PLACEMENT: stable. Experienced GI dysfunction in the setting of pelvic inflammation post-op, concern for partial obstruction. This has improved.   Post-op: Slow recovery as anticipated from large surgery with weeks of intra-abdominal infection. TPN discontinued on 1/7. Tolerating diet.  Partial SBO: Dilated loops of small bowel on x-ray 12/31.  Contrast given for small bowel follow-through.  Some contrast seen in the small bowel at time of  follow-up x-ray.  Given multiple episodes of emesis on 12/31, NG tube ordered.  Good output from ostomy and significant improvement in both her abdominal cramping and distention. Currently, having output from the ostomy with flatus and tolerating more solid foods. TPN discontinued 02/19/22. JP drain removed on 02/17/22.   Hypertension: Initially suspected to be in the setting of poorly controlled pain.  Improved with improved pain control, oral antihypertensive. PO antihypertensive started 12/29, dose increased 12/30. Has prn IV labetalol ordered.   Upper GI bleed: NGT output + for heme on 12/24.  Discussed with GI. PPI changed to BID. H&H stable. No evidence of ongoing bleeding. Appreciated GI consult. Hold lovenox and NSAIDs. IV iron given 02/17/22.   ID: Vancomycin discontinued 12/24 given AKI. Zosyn discontinued on 12/28. Mild leukocytosis improved, suspect in the setting of pSBO.   Endo: Continue sliding scale for hyperglycemia in the setting of her diabetes.  CBG (last 3)  Recent Labs    02/19/22 1546 02/19/22 2155 02/20/22 0730  GLUCAP 108* 156* 159*      Prophylaxis: Continue SCDs while in bed, ambulation. Lovenox held starting 12/24 given concern for upper GI bleed.   Plan: Plan for discontinuation of all IV medications Trial "home" day today to see how she does Encouraged increasing mobility Continue plan of care per Dr. Berline Lopes Potentially, if still doing well, could be discharged tomorrow, 1/9  Dr. Berline Lopes updated on patient's current status.  Dispo:  Discharge plan to include : home health. The patient is to be discharged to home.   LOS: 23 days    Marie Hill 02/20/2022, 8:34 AM

## 2022-02-21 ENCOUNTER — Other Ambulatory Visit: Payer: Self-pay | Admitting: Gynecologic Oncology

## 2022-02-21 DIAGNOSIS — Z9889 Other specified postprocedural states: Secondary | ICD-10-CM

## 2022-02-21 DIAGNOSIS — N7093 Salpingitis and oophoritis, unspecified: Secondary | ICD-10-CM

## 2022-02-21 DIAGNOSIS — Z932 Ileostomy status: Secondary | ICD-10-CM

## 2022-02-21 DIAGNOSIS — Z8639 Personal history of other endocrine, nutritional and metabolic disease: Secondary | ICD-10-CM

## 2022-02-21 LAB — GLUCOSE, CAPILLARY: Glucose-Capillary: 126 mg/dL — ABNORMAL HIGH (ref 70–99)

## 2022-02-21 MED ORDER — AMLODIPINE BESYLATE 10 MG PO TABS: 10.0000 mg | ORAL_TABLET | Freq: Every day | ORAL | 3 refills | Status: AC

## 2022-02-21 MED ORDER — POTASSIUM CHLORIDE CRYS ER 10 MEQ PO TBCR: 10.0000 meq | EXTENDED_RELEASE_TABLET | Freq: Two times a day (BID) | ORAL | 0 refills | Status: DC

## 2022-02-21 MED ORDER — POTASSIUM CHLORIDE CRYS ER 20 MEQ PO TBCR
20.0000 meq | EXTENDED_RELEASE_TABLET | Freq: Two times a day (BID) | ORAL | Status: DC
  Administered 2022-02-21: 20 meq via ORAL

## 2022-02-21 MED ORDER — TRAMADOL HCL 50 MG PO TABS: 50.0000 mg | ORAL_TABLET | Freq: Four times a day (QID) | ORAL | 0 refills | Status: DC | PRN

## 2022-02-21 NOTE — Progress Notes (Signed)
22 Days Post-Op Procedure(s) (LRB): OPEN unilateral SALPINGO OOPHORECTOMY,  hysterectomy  rectosigmoid resection and ileostomy (N/A) CYSTOSCOPY WITH RETROGRADE PYELOGRAM/URETERAL STENT PLACEMENT (Right)  Subjective: Patient is doing well this morning.  She reports that she was able to eat a regular diet throughout the day yesterday without any nausea, emesis, or increasing abdominal pain.  She reports that her pain is well-controlled.  She also reports soft output into her ostomy bag and gas in the bag.  She feels that she has been able to manage the ostomy on her own well.  She is ambulating without difficulty.  She continues to void without issue.  She feels ready to go home.  Objective: Vital signs in last 24 hours: Temp:  [97.7 F (36.5 C)-98.5 F (36.9 C)] 98.4 F (36.9 C) (01/09 0503) Pulse Rate:  [88-101] 88 (01/09 0503) Resp:  [14-16] 16 (01/09 0503) BP: (121-144)/(84-96) 121/84 (01/09 0503) SpO2:  [99 %-100 %] 99 % (01/09 0503) Last BM Date : 02/20/22  Intake/Output from previous day: 01/08 0701 - 01/09 0700 In: 569 [P.O.:480; I.V.:89] Out: 750 [Urine:500; Stool:250]  Physical Examination: General: Alert, oriented, no acute distress.  HEENT: Normocephalic, atraumatic. Sclera anicteric.  Chest: Clear to auscultation bilaterally. Cardiovascular: regular rate and rhythm, no murmurs, rubs, or gallops.  Abdomen: soft, nontender.  No tympany.  Normoactive bowels sounds. Incision is intact and healing without erythema or drainage. Ostomy bag with brown soft stool output with flatus in the bag, stoma pink and viable. Previous drain site healing well. Extremities: Grossly normal range of motion. Warm, well perfused. No edema bilaterally.   Labs:    Latest Ref Rng & Units 02/20/2022    2:30 AM 02/19/2022    3:04 AM 02/18/2022    4:10 AM  CBC  WBC 4.0 - 10.5 K/uL 7.7  7.9  9.3   Hemoglobin 12.0 - 15.0 g/dL 8.8  8.9  9.7   Hematocrit 36.0 - 46.0 % 28.4  28.8  31.3   Platelets 150 -  400 K/uL 414  462  560       Latest Ref Rng & Units 02/20/2022    2:30 AM 02/19/2022    3:04 AM 02/18/2022    4:10 AM  BMP  Glucose 70 - 99 mg/dL 140  155  132   BUN 6 - 20 mg/dL '13  15  16   '$ Creatinine 0.44 - 1.00 mg/dL 0.63  0.60  0.63   Sodium 135 - 145 mmol/L 137  137  139   Potassium 3.5 - 5.1 mmol/L 3.3  3.5  3.6   Chloride 98 - 111 mmol/L 107  106  106   CO2 22 - 32 mmol/L '22  24  26   '$ Calcium 8.9 - 10.3 mg/dL 8.7  9.0  9.2    Xray 12/31: Dilated loops of small bowel in the left upper quadrant and epigastric area measuring up to 3.8 cm in diameter, findings compatible with small-bowel obstruction. Consider further evaluation with CT abdomen pelvis with contrast versus ingestion of radiopaque contrast material and subsequent small-bowel follow-through radiograph series.  8hr post contrast xray: Persistent dilatation of central and left upper quadrant bowel loops, not much interval change. Potentially dilute contrast within dilated small bowel. No additional contrast visible in the colon or in the region of the right lower quadrant ostomy. There is previous contrast within the right colon that is similar in distribution as compared with the radiograph from earlier today.  Xray after NGT placement: Nasogastric tube tip  and side port project within the stomach. There is contrast material within the ascending colon. No dilated small bowel.   Assessment: 42 y.o. s/p Procedure(s): OPEN unilateral SALPINGO OOPHORECTOMY,  hysterectomy, rectosigmoid resection and ileostomy, CYSTOSCOPY WITH RETROGRADE PYELOGRAM/URETERAL STENT PLACEMENT: stable. Experienced GI dysfunction in the setting of pelvic inflammation post-op, concern for partial obstruction.  Now resolved.   Post-op: Slow recovery as anticipated from large surgery with weeks of intra-abdominal infection. TPN discontinued on 1/7. Tolerating diet well with soft stool output into ostomy bag.  Now appropriate for discharge.  Partial SBO:  Dilated loops of small bowel on x-ray 12/31.  Contrast given for small bowel follow-through.  Some contrast seen in the small bowel at time of follow-up x-ray.  Given multiple episodes of emesis on 12/31, NG tube ordered.  Good output from ostomy and significant improvement in both her abdominal cramping and distention. Currently, having output from the ostomy with flatus and tolerating solid foods. TPN discontinued 02/19/22. JP drain removed on 02/17/22.   Hypertension: Initially suspected to be in the setting of poorly controlled pain.  Improved with improved pain control, oral antihypertensive. PO antihypertensive started 12/29, dose increased 12/30. Has prn IV labetalol ordered.   Upper GI bleed: NGT output + for heme on 12/24.  Discussed with GI. PPI changed to BID. H&H stable. No evidence of ongoing bleeding. Appreciated GI consult. Hold lovenox and NSAIDs. IV iron given 02/17/22.   ID: Vancomycin discontinued 12/24 given AKI. Zosyn discontinued on 12/28. Mild leukocytosis improved, suspect in the setting of pSBO.   Endo: Continue sliding scale for hyperglycemia in the setting of her diabetes.  CBG (last 3)  Recent Labs    02/20/22 1648 02/20/22 2113 02/21/22 0720  GLUCAP 186* 191* 126*       Prophylaxis: Continue SCDs while in bed, ambulation. Lovenox held starting 12/24 given concern for upper GI bleed.   Plan: Plan for discharge home today.  Plan reviewed with Dr. Berline Lopes.   Dispo:  Discharge plan to include : home health. The patient is to be discharged to home.   LOS: 24 days    Nathan Moctezuma 02/21/2022, 9:45 AM

## 2022-02-21 NOTE — Discharge Summary (Signed)
Physician Discharge Summary  Patient ID: Marie Hill MRN: [id number] DOB/AGE: Oct 28, 1980 42 y.o.  Admit date: 01/27/2022 Discharge date: 02/21/2022  Admission Diagnoses: Tubo-ovarian abscess  Discharge Diagnoses:  Principal Problem:   Tubo-ovarian abscess Active Problems:   Post-operative state   Ileus, postoperative ([place])   Essential hypertension, benign   Discharged Condition: The patient is in good condition and stable for discharge.  Hospital Course: Marie Hill is a 42 y.o. woman initially seen in consultation at the request of [facility] for evaluation of persistent TOA and fever.   The patient has been following with Dr. Kerin Perna for infertility. She underwent a transvaginal ultrasound guided ovarian endometrioma aspiration with doxycycline for prophylaxis. When she was seen back in early November, she noted to have abdominal pain and an ultrasound noted a right endometrioma and left anechoic cyst. She was started on aygestin and solara. Pt subsequently presented to the emergency department for pain and fevers and was noted to have leukocytosis to 25, and was febrile and tachycardic. CT showed a complex multi-septated cystic right adnexal mass measuring 9.2cm (12/26/21) concerning for a TOA. Patient was admitted and started on IV cefepime, flagyl, vanc. She was discharge home on Augmentin and doxycycline for 10 days.   At follow-up on 01/11/22, an ultrasound showed a right tubo-ovarian complex measuring 10.6cm. In this setting, she was sent for consultation with Gyn Oncology with Dr. Berline Lopes. She was sent to the emergency department given pain, tachycardia and fevers. During an admission from 12/1-12/8, a drain was placed with culture returning with PREVOTELLA BIVIA. She was ultimately discharged home on a prolonged course of IV zosyn with plan for interval surgery.    Most recently, she presented in follow-up to Dr. Berline Lopes on 01/27/22 at which time the patient  noted that she had worsened at home in the past day with fevers up to 101 and feeling achy and weak. She also noted some nausea and decreased appetite. She had been flushing her drain BID with virtually no output since discharge. Given increased WBC, worsening symptoms despite IV antibiotics, recommendation was made for admission and definitive surgical management. Plan had initially been for admission at [facility], however, her insurance was out of network. As a result she was sent to the emergency room for CT scan, continuation of antibiotics and admission.  She was admitted to the hospital on 01/27/22 with improvement on IV vanc/cefepime with WBC normalizing with plan for surgical intervention. On 01/30/22, she underwent cystoscopy with right ureteral stent placement (Dr. Lovena Neighbours); exploratory laparotomy with en bloc resection including total hysterectomy, right salpingo-oophorectomy, appendectomy, and rectosigmoid resection, diverting loop ileostomy, and cystoscopy. Her post-operative course included a slow recovery given large surgery and weeks of previous intra-abdominal infection. She experienced post-operative ileus symptoms and had NG tube placement on 2 separate occasions. She had post-op CT imaging on 12/24 along with abdominal xrays throughout her admission. She was started on TPN from 1/1 through 1/7.        She was discharged to home on postoperative day 22 tolerating a regular diet, managing her ostomy, voiding without difficulty, ambulating, having output with flatus in ostomy, minimal abdominal pain. She has follow up arranged with our office on 1/19 with repeat lab work. A referral for the outpatient ostomy clinic has been placed. Home health has also been ordered for ostomy care teaching/management. PICC line removed prior to discharge.   Consults: Wound/ostomy, pharmacy for TPN, Dietician  Significant Diagnostic Studies: Labs, CT scan, Abdominal xrays  Treatments: IV  hydration, TPN, and  surgery (see above). IV antibiotics. NG tube for decompression.  Discharge Exam: Blood pressure 121/84, pulse 88, temperature 98.4 F (36.9 C), temperature source Oral, resp. rate 16, height 5' 1.81" (1.57 m), weight 111 lb 8.8 oz (50.6 kg), SpO2 99 %. Performed by Dr. Ernestina Patches  Disposition: Discharge disposition: 01-Home or Self Care       Discharge Instructions     Call MD for:  difficulty breathing, headache or visual disturbances   Complete by: As directed    Call MD for:  extreme fatigue   Complete by: As directed    Call MD for:  hives   Complete by: As directed    Call MD for:  persistant dizziness or light-headedness   Complete by: As directed    Call MD for:  persistant nausea and vomiting   Complete by: As directed    Call MD for:  redness, tenderness, or signs of infection (pain, swelling, redness, odor or green/yellow discharge around incision site)   Complete by: As directed    Call MD for:  severe uncontrolled pain   Complete by: As directed    Call MD for:  temperature >100.4   Complete by: As directed    Diet - low sodium heart healthy   Complete by: As directed    Driving Restrictions   Complete by: As directed    No driving for 2 week(s).  Do not take narcotics and drive. You need to make sure your reaction time has returned.   Increase activity slowly   Complete by: As directed    Lifting restrictions   Complete by: As directed    No lifting greater than 10 lbs, pushing, pulling, straining for 6 weeks.   Sexual Activity Restrictions   Complete by: As directed    No sexual activity, nothing in the vagina, for 10-12 weeks from surgery.      Allergies as of 02/21/2022   No Known Allergies      Medication List     STOP taking these medications    metFORMIN 500 MG 24 hr tablet Commonly known as: GLUCOPHAGE-XR Replaced by: metFORMIN 500 MG tablet   Midol Complete 500-60-15 MG Tabs Generic drug: Acetaminophen-Caff-Pyrilamine    piperacillin-tazobactam  IVPB Commonly known as: ZOSYN   sodium chloride flush 0.9 % Soln Commonly known as: NS       TAKE these medications    acetaminophen 500 MG tablet Commonly known as: TYLENOL Take 500-1,000 mg by mouth every 6 (six) hours as needed for mild pain or headache.   amLODipine 10 MG tablet Commonly known as: NORVASC Take 1 tablet (10 mg total) by mouth daily. Start taking on: February 22, 2022   Jardiance 25 MG Tabs tablet Generic drug: empagliflozin Take 25 mg by mouth daily.   magnesium oxide 400 (240 Mg) MG tablet Commonly known as: MAG-OX Take 1 tablet (400 mg total) by mouth daily.   metFORMIN 500 MG tablet Commonly known as: GLUCOPHAGE Take 2 tablets (1,000 mg total) by mouth 2 (two) times daily with a meal. Replaces: metFORMIN 500 MG 24 hr tablet   potassium chloride 10 MEQ tablet Commonly known as: KLOR-CON M Take 1 tablet (10 mEq total) by mouth 2 (two) times daily.   rosuvastatin 40 MG tablet Commonly known as: CRESTOR Take 40 mg by mouth daily.   Selenium Sulfide 2.25 % Sham Apply 1 application  topically See admin instructions. Shampoo as directed   simethicone 80 MG chewable tablet  Commonly known as: MYLICON Chew 1 tablet (80 mg total) by mouth every 6 (six) hours as needed for flatulence (gas pains).   traMADol 50 MG tablet Commonly known as: ULTRAM Take 1 tablet (50 mg total) by mouth every 6 (six) hours as needed for moderate pain or severe pain. For AFTER surgery, do not take and drive What changed: additional instructions        Follow-up Information     [facility] Follow up.   Specialty: Home Health Services Why: to provide home health nursing visits Contact information: [place] [address] [phone]         Joylene John D, NP Follow up on 03/03/2022.   Specialty: Gynecologic Oncology Why: at 1pm at the [facility] with labs before the appointment. Contact  information: [address] [place] [address] [phone]                 Greater than thirty minutes were spend for face to face discharge instructions and discharge orders/summary in EPIC.   Signed: Dorothyann Gibbs 02/21/2022, 10:56 AM

## 2022-02-21 NOTE — TOC Transition Note (Signed)
Transition of Care [facility]) - CM/SW Discharge Note   Patient Details  Name: Marie Hill MRN: [id number] Date of Birth: [birth year]  Transition of Care [facility]) CM/SW Contact:  Servando Snare, LCSW Phone Number: 02/21/2022, 11:15 AM   Clinical Narrative:   Lucile Shutters notified of patient dc.       Barriers to Discharge: Continued Medical Work up   Patient Goals and CMS Choice      Discharge Placement                         Discharge Plan and Services Additional resources added to the After Visit Summary for       Post Acute Care Choice: Home Health          DME Arranged: N/A DME Agency: NA       HH Arranged: RN [place] Agency: [place] Date [facility] Agency Contacted: 02/02/22 Time [place]: [id number] Representative spoke with at [place]: [place] Determinants of Health ([place]) Interventions [place]: No Food Insecurity (01/28/2022)  Housing: Low Risk  (01/28/2022)  Transportation Needs: No Transportation Needs (01/28/2022)  Utilities: Not At Risk (01/28/2022)  Depression (PHQ2-9): Low Risk  (04/22/2020)  Tobacco Use: Low Risk  (01/31/2022)     Readmission Risk Interventions    02/02/2022   11:30 AM 01/20/2022    1:49 PM 12/28/2021    2:04 PM  Readmission Risk Prevention Plan  Post Dischage Appt  Complete Complete  Medication Screening  Complete Complete  Transportation Screening Complete Complete Complete  PCP or Specialist Appt within 5-7 Days Complete    Home Care Screening Complete    Medication Review (RN CM) Complete

## 2022-02-21 NOTE — Progress Notes (Signed)
Reviewed written D/C with pt and all questions answered. Pt verbalized understanding. Pt left with all belongings and in stable condition.

## 2022-02-22 ENCOUNTER — Telehealth: Payer: Self-pay

## 2022-02-22 ENCOUNTER — Telehealth: Payer: Self-pay | Admitting: Oncology

## 2022-02-22 NOTE — Telephone Encounter (Signed)
Left a message for the ostomy clinic to see if patient's appointment can be moved up.  Requested a return call.  [place] from the [facility] called back and moved up Marie Hill's appointment to Friday, 02/24/22 at 11:00.  Called [place] and advised her of new appointment date and time.  She verbalized understanding and agreement.

## 2022-02-22 NOTE — Telephone Encounter (Signed)
I spoke to Marie Hill this afternoon to follow up since coming home from hospital yesterday.  She states she has no fevers/ no abdominal pain just her "belly feels like pulling" same as when she was in the hospital. 4-5/10 on pain scale. She is eating and drinking ok. Yesterday she was eating more solids but today she has been doing soft diet and has had Ensure. Ostomy output was solid yesterday and this morning but more soft this afternoon. She has noticed "leaking around the opening and had to change the bag several times (7x's yesterday 2x's today). Pt states she has not heard from Home health.   Melissa Cross aware and Elmo Putt, nurse navigator to check with Ostomy clinic to see if can get in sooner than 1/15. I will call [facility] to check when they will be seeing pt.

## 2022-02-22 NOTE — Telephone Encounter (Signed)
According to [place]  PH# [phone]) Marie Hill is not active through them. They were going to call us and see if they could use [place] to call our office with more information I spoke to [place] and she states pt's insurance has lapsed. They are going to call her and see if she has new insurance. We will call tomorrow to see what the plan is for this patient.

## 2022-02-23 ENCOUNTER — Telehealth: Payer: Self-pay | Admitting: Surgery

## 2022-02-23 ENCOUNTER — Encounter: Payer: Self-pay | Admitting: Gynecologic Oncology

## 2022-02-23 NOTE — Telephone Encounter (Addendum)
Called patient to check on her status. Patient states that she had a horrible night, that she was in a lot of pain and vomited x1. Also had a lot of ostomy output. 2 full bags, per patient. She states it isn't watery, but a mixed of food and water. Patient states that she feels much better today and has had about 1/2 bag of output today. States she is eating and drinking plenty of water. Denies issues with ostomy bag staying in place today, but states the previous bags weren't sticking to her skin. Patient has ostomy clinic appt tomorrow. Made aware of this appt and gave number to call for more info.   Patient had no further concerns at this time.

## 2022-02-24 ENCOUNTER — Encounter: Payer: Self-pay | Admitting: Gynecologic Oncology

## 2022-02-24 ENCOUNTER — Telehealth: Payer: Self-pay | Admitting: Surgery

## 2022-02-24 ENCOUNTER — Ambulatory Visit (HOSPITAL_COMMUNITY)
Admission: RE | Admit: 2022-02-24 | Discharge: 2022-02-24 | Disposition: A | Discharge status: Home / Self Care | Payer: PRIVATE HEALTH INSURANCE | Source: Ambulatory Visit | Attending: Gynecologic Oncology | Admitting: Gynecologic Oncology

## 2022-02-24 DIAGNOSIS — K9413 Enterostomy malfunction: Secondary | ICD-10-CM | POA: Diagnosis not present

## 2022-02-24 DIAGNOSIS — L538 Other specified erythematous conditions: Secondary | ICD-10-CM | POA: Diagnosis not present

## 2022-02-24 DIAGNOSIS — Z432 Encounter for attention to ileostomy: Secondary | ICD-10-CM | POA: Diagnosis present

## 2022-02-24 NOTE — Telephone Encounter (Signed)
Patient states she is feeling ok this morning. She vomited once this AM. In total had 1 1/2 bags of ostomy output yesterday and 1 bag overnight. States ostomy bag still not sticking very well. She is aware of appt with ostomy clinic later today. Patient denies any other questions or concerns at this time.

## 2022-02-24 NOTE — Progress Notes (Signed)
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      amLODipine (NORVASC) 10 MG tablet Take 1 tablet (10 mg total) by mouth daily. 30 tablet 3    empagliflozin (JARDIANCE) 25 MG TABS tablet Take 25 mg by mouth daily.      magnesium oxide (MAG-OX) 400 (240 Mg) MG tablet Take 1 tablet (400 mg total) by mouth daily. 30 tablet 3    metFORMIN (GLUCOPHAGE) 500 MG tablet Take 2 tablets (1,000 mg total) by mouth 2 (two) times daily with a meal. 60 tablet 3    potassium chloride (KLOR-CON M) 10 MEQ tablet Take 1 tablet (10 mEq total) by mouth 2 (two) times daily. 30 tablet 0    rosuvastatin (CRESTOR) 40 MG tablet Take 40 mg by mouth daily.      Selenium Sulfide 2.25 % SHAM Apply 1 application  topically See admin instructions. Shampoo as directed      simethicone (MYLICON) 80 MG chewable tablet Chew 1 tablet (80 mg total) by mouth every 6 (six) hours as needed for flatulence (gas pains). 30 tablet 3    traMADol (ULTRAM) 50 MG tablet Take 1 tablet (50 mg total) by mouth every 6 (six) hours as needed for moderate pain or severe pain. For AFTER surgery, do not take and drive 15 tablet 0    No current facility-administered medications for this  encounter.   ROS  Review of Systems  Gastrointestinal:        RLQ ileostomy  Skin:  Positive for rash.       Dermatitis to peristomal skin  All other systems reviewed and are negative.  Vital signs:  BP (!) 127/92 (BP Location: Right Arm)   Pulse (!) 101   Temp 97.9 F (36.6 C) (Oral)   Resp 20   SpO2 98%  Exam:  Physical Exam Vitals reviewed.  Constitutional:      Appearance: Normal appearance.  Abdominal:     Palpations: Abdomen is soft.  Skin:    Findings: Erythema present.  Neurological:     Mental Status: She is alert and oriented to person, place, and time.  Psychiatric:        Mood and Affect: Mood normal.        Behavior: Behavior normal.     Stoma type/location:  RLQ ileostomy Stomal assessment/size:  1" pink and moist Peristomal assessment:  erythema, denuded skin leaks frequently  HAs lost weight.   Treatment options for stomal/peristomal skin: switching to 1 piece convex pouch with barrier ring.  Skin protection with stoma powder and skin prep Output: liquid brown stool Ostomy  pouching: 1pc. Convex with stoma powder, skin prep, barrier ring.   Education provided:  switching/rationale for 1 piece pouch.  Barrier ring due to change in abdominal plane from weight loss.  Wediscuss need to eat balanced diet and adequate hydration.  She states she is sipping fluids throughout the day. Water and coconut water primarily.     Impression/dx  Contact dermatitis Discussion  Change to 1 piece Plan  Will set up with [facility] for supplies.     Visit time: 55 minutes.   Domenic Moras FNP-BC

## 2022-02-24 NOTE — Telephone Encounter (Signed)
Patient states ostomy appt went well. She states she is feeling good today. Denies nausea, bloating, abd pain. She is tolerating food intake with no concerns. Vomiting episode this morning was small amount, only mucous, no food. Patient has no other concerns at this time.

## 2022-02-25 DIAGNOSIS — K9413 Enterostomy malfunction: Secondary | ICD-10-CM | POA: Insufficient documentation

## 2022-02-25 NOTE — Discharge Instructions (Signed)
Set up with [facility]

## 2022-02-26 ENCOUNTER — Encounter: Payer: Self-pay | Admitting: Gynecologic Oncology

## 2022-02-27 ENCOUNTER — Ambulatory Visit (HOSPITAL_COMMUNITY): Payer: Commercial Managed Care - HMO | Admitting: Nurse Practitioner

## 2022-02-27 ENCOUNTER — Telehealth: Payer: Self-pay | Admitting: Oncology

## 2022-02-27 ENCOUNTER — Telehealth: Payer: Self-pay

## 2022-02-27 NOTE — Telephone Encounter (Signed)
PT called office today with concern of having vaginal bleeding.  I reviewed triage protocol with pt: She states it started yesterday a few light red  spots in her panties and on toilet tissue. She is not having to wear a pad, there is no discharge or odor. No fever/chills, no pelvic pain (outside of the normal pain she has been having since surgery, which is tolerable) On Saturday she had N/V but nothing since then. She states she is eating/drinking ok, appetite has gotten better since surgery. Today the spotting is just on toilet tissue and not in panties. She has had irritation/burning with urinating which also started yesterday but no other S&S of a UTI. She stated she wasn't doing anything prior to bleeding starting (lifting.pushing, pulling anything heavy, or straining)

## 2022-02-27 NOTE — Telephone Encounter (Signed)
Left a message with Caryl Pina at [facility] regarding home health referral.  Requested a return call.

## 2022-02-27 NOTE — Telephone Encounter (Signed)
Pt is aware and voiced an understanding

## 2022-02-27 NOTE — Telephone Encounter (Unsigned)
Corene Cornea from [facility] [employer] called back and patient's insurance is not in network.  He recommended trying [facility].

## 2022-02-28 ENCOUNTER — Other Ambulatory Visit: Payer: Self-pay | Admitting: Gynecologic Oncology

## 2022-02-28 ENCOUNTER — Other Ambulatory Visit (HOSPITAL_COMMUNITY): Payer: Self-pay | Admitting: Nurse Practitioner

## 2022-02-28 ENCOUNTER — Encounter: Payer: Self-pay | Admitting: Gynecologic Oncology

## 2022-02-28 DIAGNOSIS — Z932 Ileostomy status: Secondary | ICD-10-CM

## 2022-02-28 DIAGNOSIS — L24B3 Irritant contact dermatitis related to fecal or urinary stoma or fistula: Secondary | ICD-10-CM

## 2022-02-28 DIAGNOSIS — K9413 Enterostomy malfunction: Secondary | ICD-10-CM

## 2022-02-28 DIAGNOSIS — Z432 Encounter for attention to ileostomy: Secondary | ICD-10-CM

## 2022-02-28 NOTE — Telephone Encounter (Signed)
[facility] called back and patient's insurance is not in network.  Called [facility] and they would need DO approval.  They said to fax the referral to [phone]. Referral faxed successfully.

## 2022-03-03 ENCOUNTER — Inpatient Hospital Stay: Payer: PRIVATE HEALTH INSURANCE

## 2022-03-03 ENCOUNTER — Inpatient Hospital Stay: Payer: PRIVATE HEALTH INSURANCE | Attending: Gynecologic Oncology | Admitting: Gynecologic Oncology

## 2022-03-03 ENCOUNTER — Ambulatory Visit (HOSPITAL_COMMUNITY)
Admission: RE | Admit: 2022-03-03 | Discharge: 2022-03-03 | Disposition: A | Discharge status: Home / Self Care | Payer: PRIVATE HEALTH INSURANCE | Source: Ambulatory Visit | Attending: Internal Medicine | Admitting: Internal Medicine

## 2022-03-03 VITALS — BP 109/80 | HR 115 | Temp 98.9°F | Resp 14 | Wt 103.4 lb

## 2022-03-03 DIAGNOSIS — Z8639 Personal history of other endocrine, nutritional and metabolic disease: Secondary | ICD-10-CM

## 2022-03-03 DIAGNOSIS — N7093 Salpingitis and oophoritis, unspecified: Secondary | ICD-10-CM

## 2022-03-03 DIAGNOSIS — Z432 Encounter for attention to ileostomy: Secondary | ICD-10-CM | POA: Insufficient documentation

## 2022-03-03 DIAGNOSIS — Z932 Ileostomy status: Secondary | ICD-10-CM

## 2022-03-03 DIAGNOSIS — K9413 Enterostomy malfunction: Secondary | ICD-10-CM

## 2022-03-03 DIAGNOSIS — Z9889 Other specified postprocedural states: Secondary | ICD-10-CM

## 2022-03-03 DIAGNOSIS — L24B3 Irritant contact dermatitis related to fecal or urinary stoma or fistula: Secondary | ICD-10-CM

## 2022-03-03 DIAGNOSIS — R198 Other specified symptoms and signs involving the digestive system and abdomen: Secondary | ICD-10-CM

## 2022-03-03 DIAGNOSIS — N9489 Other specified conditions associated with female genital organs and menstrual cycle: Secondary | ICD-10-CM

## 2022-03-03 DIAGNOSIS — L259 Unspecified contact dermatitis, unspecified cause: Secondary | ICD-10-CM | POA: Insufficient documentation

## 2022-03-03 LAB — CBC WITH DIFFERENTIAL (CANCER CENTER ONLY)
Abs Immature Granulocytes: 0.02 10*3/uL (ref 0.00–0.07)
Basophils Absolute: 0 10*3/uL (ref 0.0–0.1)
Basophils Relative: 0 %
Eosinophils Absolute: 0 10*3/uL (ref 0.0–0.5)
Eosinophils Relative: 0 %
HCT: 35.8 % — ABNORMAL LOW (ref 36.0–46.0)
Hemoglobin: 11.6 g/dL — ABNORMAL LOW (ref 12.0–15.0)
Immature Granulocytes: 0 %
Lymphocytes Relative: 40 %
Lymphs Abs: 2.8 10*3/uL (ref 0.7–4.0)
MCH: 26.2 pg (ref 26.0–34.0)
MCHC: 32.4 g/dL (ref 30.0–36.0)
MCV: 80.8 fL (ref 80.0–100.0)
Monocytes Absolute: 0.4 10*3/uL (ref 0.1–1.0)
Monocytes Relative: 5 %
Neutro Abs: 3.6 10*3/uL (ref 1.7–7.7)
Neutrophils Relative %: 55 %
Platelet Count: 409 10*3/uL — ABNORMAL HIGH (ref 150–400)
RBC: 4.43 MIL/uL (ref 3.87–5.11)
RDW: 16.5 % — ABNORMAL HIGH (ref 11.5–15.5)
WBC Count: 6.8 10*3/uL (ref 4.0–10.5)
nRBC: 0 % (ref 0.0–0.2)

## 2022-03-03 LAB — COMPREHENSIVE METABOLIC PANEL
ALT: 20 U/L (ref 0–44)
AST: 24 U/L (ref 15–41)
Albumin: 4.2 g/dL (ref 3.5–5.0)
Alkaline Phosphatase: 94 U/L (ref 38–126)
Anion gap: 8 (ref 5–15)
BUN: 15 mg/dL (ref 6–20)
CO2: 27 mmol/L (ref 22–32)
Calcium: 10.4 mg/dL — ABNORMAL HIGH (ref 8.9–10.3)
Chloride: 104 mmol/L (ref 98–111)
Creatinine, Ser: 0.61 mg/dL (ref 0.44–1.00)
GFR, Estimated: >60 mL/min (ref >60)
Glucose, Bld: 101 mg/dL — ABNORMAL HIGH (ref 70–99)
Potassium: 3.9 mmol/L (ref 3.5–5.1)
Sodium: 139 mmol/L (ref 135–145)
Total Bilirubin: 0.5 mg/dL (ref 0.3–1.2)
Total Protein: 8 g/dL (ref 6.5–8.1)

## 2022-03-03 LAB — PHOSPHORUS: Phosphorus: 5.1 mg/dL — ABNORMAL HIGH (ref 2.5–4.6)

## 2022-03-03 LAB — MAGNESIUM: Magnesium: 1.6 mg/dL — ABNORMAL LOW (ref 1.7–2.4)

## 2022-03-03 MED ORDER — LOPERAMIDE HCL 2 MG PO CAPS: 2.0000 mg | ORAL_CAPSULE | Freq: Every day | ORAL | 1 refills | Status: DC

## 2022-03-03 NOTE — Progress Notes (Unsigned)
Patient to follow-up she states she is doing well at home.  She feels she has been drinking enough, 3-4 16-20 oz bottles of water, 2 ensure drinks. No N/V. Feels weak and having bilateral upper extrem tenderness with touch. Feels her taste is coming back. 4 days ago she had abdominal pain, moderate pulling sensation, and pain medication helped with this. She has been emptying her ostomy every 2-3 hours and has full bags. + flatus in the bag. Having redness and sensitivity around the stoma due to freq bag changes. No fever, chills.

## 2022-03-03 NOTE — Progress Notes (Signed)
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 30 tablet 3    loperamide (IMODIUM) 2 MG capsule Take 1 capsule (2 mg total) by mouth daily. 30 capsule 1    magnesium oxide (MAG-OX) 400 (240 Mg) MG tablet Take 1 tablet (400 mg total) by mouth daily. 30 tablet 3    metFORMIN (GLUCOPHAGE) 500 MG tablet Take 2 tablets (1,000 mg total) by mouth 2 (two) times daily with a meal. 60 tablet 3    potassium chloride (KLOR-CON M) 10 MEQ tablet Take 1 tablet (10 mEq total) by mouth 2 (two) times daily. 30 tablet 0    rosuvastatin (CRESTOR) 40 MG tablet Take 40 mg by mouth daily.      Selenium Sulfide 2.25 % SHAM Apply 1 application  topically See admin instructions. Shampoo as directed      simethicone (MYLICON) 80 MG chewable tablet Chew 1 tablet (80 mg total) by mouth every 6 (six) hours as needed for flatulence (gas pains). 30 tablet 3    traMADol (ULTRAM) 50 MG tablet Take 1 tablet (50 mg total) by mouth every 6 (six) hours as needed for moderate pain or severe pain. For AFTER surgery, do not take and drive 15 tablet 0    No current facility-administered medications for this encounter.   ROS  Review of Systems  Constitutional:  Positive for fatigue.  Gastrointestinal:        RLQ  ileostomy  Skin:  Positive for rash.       Peristomal breakdown  All other systems reviewed and are negative.  Vital signs:  There were no vitals taken for this visit. Exam:  Physical Exam Constitutional:      Appearance: Normal appearance.  HENT:     Mouth/Throat:     Mouth: Mucous membranes are moist.  Abdominal:     Palpations: Abdomen is soft.     Comments: Midline abdominal surgical site, intact  Skin:    General: Skin is warm and dry.     Findings: Erythema and rash present.  Neurological:     Mental Status: She is alert and oriented to person, place, and time.  Psychiatric:     Comments: Distressed over leaks. Cannot return to work yet.      Stoma type/location:  RLQ ileostomy Stomal assessment/size:  1 " pink andmoist Peristomal assessment:  peristomal skin remains pink and tender   States she is having a lot of flatus still.  Recommend beano.  She has been taking an all natural product I have not heard of.  Suggested she try something different.  Treatment options for stomal/peristomal skin: 1 piece convex.  Filtered.  She understands that filter does not work if it gets wet from the inside.  She has primarily liquid effluent so this is highly likely.   At times, her stool is solid as well  Her leaks are primarily at night.  I think the bag is leaking due to the bag overfilling. We discuss emptying when 1/3 full.   Output: liquid green stool Ostomy pouching: 1pc. Convex  She states that [facility] has called her and collected a co pay and no supplies are delivered.  I had enrolled with [facility] but they have not called her back.Denzil Hughes was sending 2 piece pouches, Need to clarify with them the correct supplies.  She understands that she can return pouches if not opened.  Education provided:  See above Continue stoma powder and skin prep to skin breakdown. Barrier ring Convex pouch Flatus management Frequent emptying, burping if gas fills the bag    Impression/dx   Contact dermatitis Discussion  See above Plan  See back in one week.  Assess supplies, skin, pouch integrity.    Visit time: 55 minutes.   Domenic Moras FNP-BC

## 2022-03-03 NOTE — Patient Instructions (Addendum)
We have sent in imodium to begin taking once a day to slow the output from the ostomy. Monitor and measure the output from the ostomy. We may have to increase the dose of the imodium.   Continue taking the daily magnesium since your magnesium is slightly low. Hopefully with slowing the output from the ostomy, this will help to increase your magnesium.  We will set you up for lab work next week at the [facility] to see how you are doing.  You will need to push fluids and try to stay hydrated.   We used silver nitrate on a healing area at the top of the vagina. You may have some gray discharge or spotting after this. This is normal.  Remember nothing the vagina for a total of 10-12 weeks from surgery.  Please call the office at [phone] for any questions or concerns.

## 2022-03-06 ENCOUNTER — Telehealth: Payer: Self-pay | Admitting: *Deleted

## 2022-03-06 DIAGNOSIS — L24B3 Irritant contact dermatitis related to fecal or urinary stoma or fistula: Secondary | ICD-10-CM | POA: Insufficient documentation

## 2022-03-06 NOTE — Discharge Instructions (Signed)
Call [facility]  ensure they are sending 1 piece convex pouch

## 2022-03-06 NOTE — Telephone Encounter (Addendum)
Follow-up call to patient for update since starting Imodium.  Patient reports she is actually feeling better, has more taste and eating better, feels less weak.  Started Imodium Sat afternoon and output measurements for Sunday include 200 cc 400 cc 100 ccBag leaking 250 cc 300 cc Total 1250 cc for 24 hours.   Encouraged to continue increasing hydration.  Has follow-up appointment for labs on 03-09-22.  Advised to continue Imodium daily.

## 2022-03-08 ENCOUNTER — Other Ambulatory Visit: Payer: Self-pay

## 2022-03-08 ENCOUNTER — Inpatient Hospital Stay: Payer: PRIVATE HEALTH INSURANCE

## 2022-03-08 ENCOUNTER — Inpatient Hospital Stay (HOSPITAL_BASED_OUTPATIENT_CLINIC_OR_DEPARTMENT_OTHER): Payer: PRIVATE HEALTH INSURANCE | Admitting: Obstetrics & Gynecology

## 2022-03-08 ENCOUNTER — Encounter: Payer: Self-pay | Admitting: Gynecologic Oncology

## 2022-03-08 ENCOUNTER — Telehealth: Payer: Self-pay | Admitting: *Deleted

## 2022-03-08 ENCOUNTER — Encounter: Payer: Self-pay | Admitting: Obstetrics & Gynecology

## 2022-03-08 ENCOUNTER — Other Ambulatory Visit: Payer: Self-pay | Admitting: Gynecologic Oncology

## 2022-03-08 VITALS — BP 108/80 | HR 107 | Temp 99.5°F | Resp 20

## 2022-03-08 VITALS — BP 126/90 | HR 113 | Temp 99.5°F | Resp 20 | Wt 105.0 lb

## 2022-03-08 DIAGNOSIS — Z1152 Encounter for screening for COVID-19: Secondary | ICD-10-CM | POA: Insufficient documentation

## 2022-03-08 DIAGNOSIS — E860 Dehydration: Secondary | ICD-10-CM

## 2022-03-08 DIAGNOSIS — R531 Weakness: Secondary | ICD-10-CM | POA: Diagnosis not present

## 2022-03-08 DIAGNOSIS — R000 Tachycardia, unspecified: Secondary | ICD-10-CM | POA: Insufficient documentation

## 2022-03-08 DIAGNOSIS — R051 Acute cough: Secondary | ICD-10-CM

## 2022-03-08 DIAGNOSIS — J101 Influenza due to other identified influenza virus with other respiratory manifestations: Secondary | ICD-10-CM | POA: Insufficient documentation

## 2022-03-08 DIAGNOSIS — Z1159 Encounter for screening for other viral diseases: Secondary | ICD-10-CM | POA: Diagnosis not present

## 2022-03-08 DIAGNOSIS — R059 Cough, unspecified: Secondary | ICD-10-CM | POA: Diagnosis not present

## 2022-03-08 DIAGNOSIS — R198 Other specified symptoms and signs involving the digestive system and abdomen: Secondary | ICD-10-CM

## 2022-03-08 DIAGNOSIS — R42 Dizziness and giddiness: Secondary | ICD-10-CM | POA: Diagnosis not present

## 2022-03-08 DIAGNOSIS — R197 Diarrhea, unspecified: Secondary | ICD-10-CM | POA: Insufficient documentation

## 2022-03-08 DIAGNOSIS — R5383 Other fatigue: Secondary | ICD-10-CM | POA: Insufficient documentation

## 2022-03-08 LAB — BASIC METABOLIC PANEL
Anion gap: 9 (ref 5–15)
BUN: 10 mg/dL (ref 6–20)
CO2: 26 mmol/L (ref 22–32)
Calcium: 9.3 mg/dL (ref 8.9–10.3)
Chloride: 102 mmol/L (ref 98–111)
Creatinine, Ser: 0.65 mg/dL (ref 0.44–1.00)
GFR, Estimated: >60 mL/min (ref >60)
Glucose, Bld: 89 mg/dL (ref 70–99)
Potassium: 3.7 mmol/L (ref 3.5–5.1)
Sodium: 137 mmol/L (ref 135–145)

## 2022-03-08 LAB — URINALYSIS, COMPLETE (UACMP) WITH MICROSCOPIC
Bacteria, UA: NONE SEEN
Bilirubin Urine: NEGATIVE
Glucose, UA: NEGATIVE mg/dL
Hgb urine dipstick: NEGATIVE
Ketones, ur: NEGATIVE mg/dL
Leukocytes,Ua: NEGATIVE
Nitrite: NEGATIVE
Protein, ur: NEGATIVE mg/dL
Specific Gravity, Urine: 1.017 (ref 1.005–1.030)
pH: 6 (ref 5.0–8.0)

## 2022-03-08 LAB — RESP PANEL BY RT-PCR (RSV, FLU A&B, COVID)  RVPGX2
Influenza A by PCR: NEGATIVE
Influenza B by PCR: POSITIVE — AB
Resp Syncytial Virus by PCR: NEGATIVE
SARS Coronavirus 2 by RT PCR: NEGATIVE

## 2022-03-08 LAB — CBC (CANCER CENTER ONLY)
HCT: 32.1 % — ABNORMAL LOW (ref 36.0–46.0)
Hemoglobin: 10.5 g/dL — ABNORMAL LOW (ref 12.0–15.0)
MCH: 26.3 pg (ref 26.0–34.0)
MCHC: 32.7 g/dL (ref 30.0–36.0)
MCV: 80.5 fL (ref 80.0–100.0)
Platelet Count: 246 10*3/uL (ref 150–400)
RBC: 3.99 MIL/uL (ref 3.87–5.11)
RDW: 16.1 % — ABNORMAL HIGH (ref 11.5–15.5)
WBC Count: 3.7 10*3/uL — ABNORMAL LOW (ref 4.0–10.5)
nRBC: 0 % (ref 0.0–0.2)

## 2022-03-08 LAB — MAGNESIUM: Magnesium: 1.9 mg/dL (ref 1.7–2.4)

## 2022-03-08 LAB — PHOSPHORUS: Phosphorus: 4.9 mg/dL — ABNORMAL HIGH (ref 2.5–4.6)

## 2022-03-08 MED ORDER — BENZONATATE 100 MG PO CAPS: 100.0000 mg | ORAL_CAPSULE | Freq: Three times a day (TID) | ORAL | 0 refills | Status: DC | PRN

## 2022-03-08 MED ORDER — SODIUM CHLORIDE 0.9 % IV SOLN: INTRAVENOUS | Status: DC

## 2022-03-08 NOTE — Patient Instructions (Signed)
Dehydration, Adult Dehydration is condition in which there is not enough water or other fluids in the body. This happens when a person loses more fluids than he or she takes in. Important body parts cannot work right without the right amount of fluids. Any loss of fluids from the body can cause dehydration. Dehydration can be mild, worse, or very bad. It should be treated right away to keep it from getting very bad. What are the causes? This condition may be caused by: Conditions that cause loss of water or other fluids, such as: Watery poop (diarrhea). Vomiting. Sweating a lot. Peeing (urinating) a lot. Not drinking enough fluids, especially when you: Are ill. Are doing things that take a lot of energy to do. Other illnesses and conditions, such as fever or infection. Certain medicines, such as medicines that take extra fluid out of the body (diuretics). Lack of safe drinking water. Not being able to get enough water and food. What increases the risk? The following factors may make you more likely to develop this condition: Having a long-term (chronic) illness that has not been treated the right way, such as: Diabetes. Heart disease. Kidney disease. Being 65 years of age or older. Having a disability. Living in a place that is high above the ground or sea (high in altitude). The thinner, dried air causes more fluid loss. Doing exercises that put stress on your body for a long time. What are the signs or symptoms? Symptoms of dehydration depend on how bad it is. Mild or worse dehydration Thirst. Dry lips or dry mouth. Feeling dizzy or light-headed, especially when you stand up from sitting. Muscle cramps. Your body making: Dark pee (urine). Pee may be the color of tea. Less pee than normal. Less tears than normal. Headache. Very bad dehydration Changes in skin. Skin may: Be cold to the touch (clammy). Be blotchy or pale. Not go back to normal right after you lightly pinch  it and let it go. Little or no tears, pee, or sweat. Changes in vital signs, such as: Fast breathing. Low blood pressure. Weak pulse. Pulse that is more than 100 beats a minute when you are sitting still. Other changes, such as: Feeling very thirsty. Eyes that look hollow (sunken). Cold hands and feet. Being mixed up (confused). Being very tired (lethargic) or having trouble waking from sleep. Short-term weight loss. Loss of consciousness. How is this treated? Treatment for this condition depends on how bad it is. Treatment should start right away. Do not wait until your condition gets very bad. Very bad dehydration is an emergency. You will need to go to a hospital. Mild or worse dehydration can be treated at home. You may be asked to: Drink more fluids. Drink an oral rehydration solution (ORS). This drink helps get the right amounts of fluids and salts and minerals in the blood (electrolytes). Very bad dehydration can be treated: With fluids through an IV tube. By getting normal levels of salts and minerals in your blood. This is often done by giving salts and minerals through a tube. The tube is passed through your nose and into your stomach. By treating the root cause. Follow these instructions at home: Oral rehydration solution If told by your doctor, drink an ORS: Make an ORS. Use instructions on the package. Start by drinking small amounts, about  cup (120 mL) every 5-10 minutes. Slowly drink more until you have had the amount that your doctor said to have. Eating and drinking          Drink enough clear fluid to keep your pee pale yellow. If you were told to drink an ORS, finish the ORS first. Then, start slowly drinking other clear fluids. Drink fluids such as: Water. Do not drink only water. Doing that can make the salt (sodium) level in your body get too low. Water from ice chips you suck on. Fruit juice that you have added water to (diluted). Low-calorie sports  drinks. Eat foods that have the right amounts of salts and minerals, such as: Bananas. Oranges. Potatoes. Tomatoes. Spinach. Do not drink alcohol. Avoid: Drinks that have a lot of sugar. These include: High-calorie sports drinks. Fruit juice that you did not add water to. Soda. Caffeine. Foods that are greasy or have a lot of fat or sugar. General instructions Take over-the-counter and prescription medicines only as told by your doctor. Do not take salt tablets. Doing that can make the salt level in your body get too high. Return to your normal activities as told by your doctor. Ask your doctor what activities are safe for you. Keep all follow-up visits as told by your doctor. This is important. Contact a doctor if: You have pain in your belly (abdomen) and the pain: Gets worse. Stays in one place. You have a rash. You have a stiff neck. You get angry or annoyed (irritable) more easily than normal. You are more tired or have a harder time waking than normal. You feel: Weak or dizzy. Very thirsty. Get help right away if you have: Any symptoms of very bad dehydration. Symptoms of vomiting, such as: You cannot eat or drink without vomiting. Your vomiting gets worse or does not go away. Your vomit has blood or green stuff in it. Symptoms that get worse with treatment. A fever. A very bad headache. Problems with peeing or pooping (having a bowel movement), such as: Watery poop that gets worse or does not go away. Blood in your poop (stool). This may cause poop to look black and tarry. Not peeing in 6-8 hours. Peeing only a small amount of very dark pee in 6-8 hours. Trouble breathing. These symptoms may be an emergency. Do not wait to see if the symptoms will go away. Get medical help right away. Call your local emergency services (911 in the [place] Do not drive yourself to the hospital. Summary Dehydration is a condition in which there is not enough water or other fluids  in the body. This happens when a person loses more fluids than he or she takes in. Treatment for this condition depends on how bad it is. Treatment should be started right away. Do not wait until your condition gets very bad. Drink enough clear fluid to keep your pee pale yellow. If you were told to drink an oral rehydration solution (ORS), finish the ORS first. Then, start slowly drinking other clear fluids. Take over-the-counter and prescription medicines only as told by your doctor. Get help right away if you have any symptoms of very bad dehydration. This information is not intended to replace advice given to you by your health care provider. Make sure you discuss any questions you have with your health care provider. Document Revised: 06/08/2021 Document Reviewed: 09/12/2018 Elsevier Patient Education  2023 Elsevier Inc.  

## 2022-03-08 NOTE — Patient Instructions (Signed)
You will need to increase the imodium to twice daily minimum, possibly 3 times if the output is very watery.   We are planning on checking flu and covid today given your symptoms. We will notify you of the results.   We are going to give you some IV fluids today since you have symptoms and signs of dehydration. Try to stay hydrated at home.   We will call you tomorrow to check in and see how you are doing.   If you start feeling very poorly, throwing up, high fevers, we recommend going to the ER.

## 2022-03-08 NOTE — Telephone Encounter (Signed)
I spoke with [name] about how she is feeling today. She reports that yesterday and last night she felt achy and had some shoulder and neck pain. She states that her drainage from her ostomy has been 1350 cc in 24 hours. She took her temp last night and it was 100. She reports feeling better had not taken her temperature today. While on the phone she repeated her temp and it was 100. M. Cross NP and Dr. Delsa Sale notified. They requested that she be seen here in the GYN clinic today. Pt is agreeable to come in today for la draw and office visit.

## 2022-03-08 NOTE — Discharge Summary (Signed)
OB Discharge Summary     Patient Name: Marie Hill DOB: 10-26-1980 MRN: [id number]  Date of admission: 01/13/2022  Date of discharge: 01/20/2022  Admitting diagnosis: Ovarian cystic mass [N83.209] Ovarian mass, right [N83.8] Sepsis, due to unspecified organism, unspecified whether acute organ dysfunction present ([place]) [A41.9] Intrauterine pregnancy: Unknown     Secondary diagnosis:  Principal Problem:   Ovarian cystic mass Active Problems:   Tubo-ovarian abscess  Additional problems: Severe endometriosis     Discharge diagnosis: Right tubo-ovarian complex with intra-abdominal infection, Prevotella bivia                                                                                                Complications: None  Hospital course: This is the second admission of this 42 year old female with long history of severe endometriosis who had undergone a transvaginal ultrasound guided aspiration of bilateral ovarian endometriomas in my office under doxycycline prophylaxis and was subsequently admitted with pelvic infection and has been treated with IV cefepime, Flagyl and vancomycin.  At this time she was readmitted while taking Augmentin and doxycycline because of worsening clinical signs of intra-abdominal infection and leukocytosis.  IV antibiotics were restarted and interventional radiology placed a drain with culture returning Prevotella bivia.  During the hospital stay her symptoms such as fever and pain improved and she was discharged home for a prolonged course of IV Zosyn and plan for surgery by GYN oncology since since she has no longer been interested in fertility and was desiring definitive surgery.  Physical exam  Vitals:   01/19/22 0612 01/19/22 1402 01/19/22 2131 01/20/22 0526  BP: (!) 143/93 127/89 128/86 124/82  Pulse: 86 (!) 108 93 91  Resp: '16 18 18 16  '$ Temp: 98.5 F (36.9 C) 97.6 F (36.4 C) 97.7 F (36.5 C) 98.1 F (36.7 C)  TempSrc: Oral Oral Oral Oral  SpO2:  96% 98% 97% 97%  Weight:      Height:        Labs: Lab Results  Component Value Date   WBC 6.8 03/03/2022   HGB 11.6 (L) 03/03/2022   HCT 35.8 (L) 03/03/2022   MCV 80.8 03/03/2022   PLT 409 (H) 03/03/2022      Latest Ref Rng & Units 03/03/2022   11:18 AM  CMP  Glucose 70 - 99 mg/dL 101   BUN 6 - 20 mg/dL 15   Creatinine 0.44 - 1.00 mg/dL 0.61   Sodium 135 - 145 mmol/L 139   Potassium 3.5 - 5.1 mmol/L 3.9   Chloride 98 - 111 mmol/L 104   CO2 22 - 32 mmol/L 27   Calcium 8.9 - 10.3 mg/dL 10.4   Total Protein 6.5 - 8.1 g/dL 8.0   Total Bilirubin 0.3 - 1.2 mg/dL 0.5   Alkaline Phos 38 - 126 U/L 94   AST 15 - 41 U/L 24   ALT 0 - 44 U/L 20     After visit meds:  Allergies as of 01/20/2022   No Known Allergies      Medication List     TAKE these medications  rosuvastatin 40 MG tablet Commonly known as: CRESTOR Take 40 mg by mouth daily.               Discharge Care Instructions  (From admission, onward)           Start     Ordered   01/20/22 0000  Change dressing on IV access line weekly and PRN  (Home infusion instructions - [facility] )        01/20/22 1158            Diet: routine diet  Activity: Advance as tolerated. Pelvic rest for 6 weeks.   Outpatient follow up: Follow-up with Dr. Berline Lopes at [facility] oncology clinic Follow up Appt: Future Appointments  Date Time Provider [place]  03/09/2022  2:15 PM [facility] None  03/10/2022  9:00 AM [facility], Deliah Goody, FNP [facility] None   Disposition: Home with plan for IV antibiotics   03/08/2022 Governor Specking, MD

## 2022-03-08 NOTE — Addendum Note (Signed)
Addended by: Joylene John D on: 03/08/2022 02:24 PM   Modules accepted: Orders

## 2022-03-08 NOTE — Progress Notes (Signed)
Follow Up Note: Gyn-Onc  Marie Hill 42 y.o. female  CC: "Not feeling well"   HPI: She is S/P, cystoscopy with right ureteral stent placement (Dr. Lovena Neighbours); exploratory laparotomy with en bloc resection including total hysterectomy, right salpingo-oophorectomy, appendectomy, and rectosigmoid resection, diverting loop ileostomy on 01/30/22.  She has had a protracted hospital course.      Interval History: She has done well until yesterday.  She reports a low grade One Temp to 100, fatigue, dry cough, HA, dizziness when standing., shoulder, back, neck ache and anorexia.  No C/P, SOB. abd pain, N/V.  No obvious exposures. She has high out put from the ileostomy, >1 L in 24 hours.  Taking imodium once daily.    Review of Systems  Review of Systems  Constitutional:  Positive for malaise/fatigue.  Respiratory:  Negative for shortness of breath and wheezing.   Cardiovascular:  Negative for chest pain and leg swelling.  Gastrointestinal:  Negative for abdominal pain, blood in stool, constipation, nausea and vomiting.  Genitourinary:  Negative for dysuria, frequency, hematuria and urgency.  Musculoskeletal:  Positive for myalgias.  Neurological: Positive for weakness.  Psychiatric/Behavioral:  Negative for depression. The patient does not have insomnia.    Current medications, allergy, social history, past surgical history, past medical history, family history were all reviewed.    Vitals:  Blood pressure (!) 126/90, pulse (!) 113, temperature 99.5 F (37.5 C), resp. rate 20, weight 105 lb (47.6 kg), SpO2 100 %.  Physical Exam:  Physical Exam Exam conducted with a chaperone present.  Constitutional:      General: She is not in acute distress.  Non-toxic appearing Cardiovascular:     Rate and Rhythm: Tachycardic and regular rhythm.  Pulmonary:     Effort: Pulmonary effort is normal.     Breath sounds: Normal breath sounds. No wheezing or rhonchi.  Abdominal:     Palpations: Abdomen is  soft. Mild tympany.  Normoactive BS.  Stoma pink--liquid stool in the pouch     Tenderness: There is no abdominal tenderness. There is no right CVA tenderness or left CVA tenderness.     Hernia: No hernia is present.  Musculoskeletal:     Cervical back: Neck supple.     Right lower leg: No edema.     Left lower leg: No edema.     Findings: No rash.  Neurological:     Mental Status: She is oriented to person, place, and time.   CBC    Component Value Date/Time   WBC 3.7 (L) 03/08/2022 1530   WBC 7.7 02/20/2022 0230   RBC 3.99 03/08/2022 1530   HGB 10.5 (L) 03/08/2022 1530   HGB 12.8 06/22/2016 0849   HCT 32.1 (L) 03/08/2022 1530   HCT 39.5 06/22/2016 0849   PLT 246 03/08/2022 1530   PLT 304 06/22/2016 0849   MCV 80.5 03/08/2022 1530   MCV 81 06/22/2016 0849   MCH 26.3 03/08/2022 1530   MCHC 32.7 03/08/2022 1530   RDW 16.1 (H) 03/08/2022 1530   RDW 14.2 06/22/2016 0849   LYMPHSABS 2.8 03/03/2022 1118   LYMPHSABS 2.5 06/22/2016 0849   MONOABS 0.4 03/03/2022 1118   EOSABS 0.0 03/03/2022 1118   EOSABS 0.2 06/22/2016 0849   BASOSABS 0.0 03/03/2022 1118   BASOSABS 0.0 06/22/2016 0849    Assessment/Plan:  Suspect viral syndrome aggravated by moderate dehydration 2/2 high volume output from ileostomy  >review the electrolyte results >fluid resuscitation w/1 L IV NS-->recheck VS, orthostatic vitals >anticipate discharge  home if not orthostatic and w/o significant metabolic disturbance; increase imodium dosing, fiber intake, fluid intake >return prn/close follow-up    Lahoma Crocker, MD

## 2022-03-09 ENCOUNTER — Telehealth: Payer: Self-pay

## 2022-03-09 ENCOUNTER — Inpatient Hospital Stay: Payer: PRIVATE HEALTH INSURANCE

## 2022-03-09 ENCOUNTER — Telehealth: Payer: Self-pay | Admitting: Gynecologic Oncology

## 2022-03-09 NOTE — Telephone Encounter (Signed)
Per Joylene John NP, Pt is aware she tested positive for the flu, advised hydration and Tylenol/Ibuprofen as needed. Pt also aware results showed high amounts of glucose in her urine. She states she has not been checking her glucose levels (doesn't remember the last check). She checked while I was on the phone fasting (106). She does have a PCP and will call them today for follow up on glucose.  Pt aware I will fax over a copy of recent results to her PCP. She voiced an over all understanding of above instructions and will call our office with any further questions or concerns.

## 2022-03-09 NOTE — Telephone Encounter (Signed)
Called to check in on patient. She states she is feeling better today. Discussed urine results from yesterday being negative for glucose or sign of infection. Discussed her UA from Dec had glucose when she was going through the infection and frequent hospitalizations. She states she had a PCP appt today but she will postpone this and continue monitoring her blood glucose levels. Advised to call for any needs, new symptoms.

## 2022-03-10 ENCOUNTER — Ambulatory Visit (HOSPITAL_COMMUNITY): Payer: PRIVATE HEALTH INSURANCE | Admitting: Nurse Practitioner

## 2022-03-22 ENCOUNTER — Telehealth: Payer: Self-pay

## 2022-03-22 NOTE — Telephone Encounter (Signed)
Marie Hill called the office stating when she was changing the Ostomy bag, it was stuck to her incision and now she has white discharge with red blood coming from the incision. Not heavy or gushing.  She says it may be infected. No odor or fever. No pain just real itchy in that area. She is emailing a picture.  [place] notified.

## 2022-03-22 NOTE — Telephone Encounter (Signed)
Pt is aware of message from Joylene John NP

## 2022-03-24 ENCOUNTER — Ambulatory Visit (HOSPITAL_COMMUNITY)
Admission: RE | Admit: 2022-03-24 | Discharge: 2022-03-24 | Disposition: A | Discharge status: Home / Self Care | Payer: PRIVATE HEALTH INSURANCE | Source: Ambulatory Visit | Attending: Nurse Practitioner | Admitting: Nurse Practitioner

## 2022-03-24 DIAGNOSIS — L24B3 Irritant contact dermatitis related to fecal or urinary stoma or fistula: Secondary | ICD-10-CM | POA: Diagnosis not present

## 2022-03-24 DIAGNOSIS — Z432 Encounter for attention to ileostomy: Secondary | ICD-10-CM | POA: Diagnosis not present

## 2022-03-24 DIAGNOSIS — L259 Unspecified contact dermatitis, unspecified cause: Secondary | ICD-10-CM | POA: Insufficient documentation

## 2022-03-24 DIAGNOSIS — K9419 Other complications of enterostomy: Secondary | ICD-10-CM | POA: Insufficient documentation

## 2022-03-24 DIAGNOSIS — T8131XA Disruption of external operation (surgical) wound, not elsewhere classified, initial encounter: Secondary | ICD-10-CM

## 2022-03-24 NOTE — Progress Notes (Signed)
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 30 tablet 3    benzonatate (TESSALON) 100 MG capsule Take 1 capsule (100 mg total) by mouth 3 (three) times daily as needed for cough. 20 capsule 0    loperamide (IMODIUM) 2 MG capsule Take 1 capsule (2 mg total) by mouth daily. 30 capsule 1    magnesium oxide (MAG-OX) 400 (240 Mg) MG tablet Take 1 tablet (400 mg total) by mouth daily. 30 tablet 3    metFORMIN (GLUCOPHAGE) 500 MG tablet Take 2 tablets (1,000 mg total) by mouth 2 (two) times daily with a meal. 60 tablet 3    potassium chloride (KLOR-CON M) 10 MEQ tablet Take 1 tablet (10 mEq total) by mouth 2 (two) times daily. 30 tablet 0    rosuvastatin (CRESTOR) 40 MG tablet Take 40 mg by mouth daily.      Selenium Sulfide 2.25 % SHAM Apply 1 application  topically See admin instructions. Shampoo as directed      simethicone (MYLICON) 80 MG chewable tablet Chew 1 tablet (80 mg total) by mouth every 6 (six) hours as needed for flatulence (gas pains). 30 tablet 3    traMADol (ULTRAM) 50 MG tablet Take 1 tablet (50 mg total) by mouth every 6 (six) hours as needed for moderate pain or severe pain. For AFTER surgery, do not  take and drive 15 tablet 0    No current facility-administered medications for this encounter.   ROS  Review of Systems  Constitutional:  Positive for unexpected weight change.       Recently had flu.  Lost weight, became dehydrated  Gastrointestinal:        RLQ ileostomy  Skin:  Positive for rash.       Disruption midline wound along staple line, partial thickness  Psychiatric/Behavioral: Negative.    All other systems reviewed and are negative.  Vital signs:  BP 111/77 (BP Location: Left Arm)   Pulse 72   Temp 98 F (36.7 C) (Oral)   Resp 20   SpO2 100%  Exam:  Physical Exam Vitals reviewed.  Constitutional:      Appearance: Normal appearance.  Abdominal:     Palpations: Abdomen is soft.  Skin:    General: Skin is warm and dry.     Findings: Lesion and rash present.  Neurological:     Mental Status: She is alert and oriented to person, place, and time.  Psychiatric:        Mood and Affect: Mood normal.        Behavior: Behavior normal.     Stoma  type/location:  RLQ ileostomy Stomal assessment/size:  1 1/8" pink and moist Peristomal assessment:  contact dermatitis from 3 to 6 o'clock, improving Pouch adhesive has caused irritation to midline incision site, dry and scabbed.  Patient states there was white effluent present.  Most likely this was moist slough beneath pouch.  Dry today.  I will place small piece of barrier ring over this area.  We are cutting pouch off center to avoid as much of incision line as possible.  We are using smallest appliance available.  I send her home with pattern for off center cutting of barrier Treatment options for stomal/peristomal skin: stoma powder skin prep and barrier ring Output: soft brown Ostomy pouching: 1pc.convex Education provided:  Gas has improved.  Emotional support provided that midline site looks fine.  Rationale for barrier ring piece over wound and around stoma.      Impression/dx  Contact  dermatitis Ileostomy Disruption midline incision, partial thickness (medical adhesive related skin injury) Discussion  Recently had to receive IV fluids for dehydration due to flu like illness Is feeling stronger now Plan  See back in 2 weeks Update [facility]  They did not send all supplies needed    Visit time: 45 minutes.   Domenic Moras FNP-BC

## 2022-03-24 NOTE — Discharge Instructions (Signed)
Stoma is 1 1/8"  we cut off center (use pattern) Place small piece of barrier ring over wound Stoma powder and skin prep Barrier ring around stoma 1 piece convex *Will update [facility]- need barrier rings, stoma powder and skin prep

## 2022-03-25 DIAGNOSIS — T8131XA Disruption of external operation (surgical) wound, not elsewhere classified, initial encounter: Secondary | ICD-10-CM | POA: Insufficient documentation

## 2022-03-25 DIAGNOSIS — Z432 Encounter for attention to ileostomy: Secondary | ICD-10-CM | POA: Insufficient documentation

## 2022-03-28 ENCOUNTER — Other Ambulatory Visit (HOSPITAL_COMMUNITY): Payer: Self-pay | Admitting: Nurse Practitioner

## 2022-03-28 DIAGNOSIS — Z432 Encounter for attention to ileostomy: Secondary | ICD-10-CM

## 2022-03-28 DIAGNOSIS — L24B3 Irritant contact dermatitis related to fecal or urinary stoma or fistula: Secondary | ICD-10-CM

## 2022-04-04 ENCOUNTER — Other Ambulatory Visit (HOSPITAL_COMMUNITY): Payer: Self-pay | Admitting: Nurse Practitioner

## 2022-04-04 ENCOUNTER — Ambulatory Visit (HOSPITAL_COMMUNITY)
Admission: RE | Admit: 2022-04-04 | Discharge: 2022-04-04 | Disposition: A | Discharge status: Home / Self Care | Payer: PRIVATE HEALTH INSURANCE | Source: Ambulatory Visit | Attending: Nurse Practitioner | Admitting: Nurse Practitioner

## 2022-04-04 DIAGNOSIS — L24B3 Irritant contact dermatitis related to fecal or urinary stoma or fistula: Secondary | ICD-10-CM | POA: Diagnosis not present

## 2022-04-04 DIAGNOSIS — Z432 Encounter for attention to ileostomy: Secondary | ICD-10-CM | POA: Diagnosis not present

## 2022-04-04 DIAGNOSIS — K9413 Enterostomy malfunction: Secondary | ICD-10-CM

## 2022-04-04 DIAGNOSIS — Z932 Ileostomy status: Secondary | ICD-10-CM | POA: Diagnosis present

## 2022-04-04 NOTE — Discharge Instructions (Signed)
Continue 1 piece convex, cut off center Incision line healed  protect with barrier  Update pouch with [facility]

## 2022-04-04 NOTE — Progress Notes (Signed)
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 30 tablet 3    benzonatate (TESSALON) 100 MG capsule Take 1 capsule (100 mg total) by mouth 3 (three) times daily as needed for cough. 20 capsule 0    loperamide (IMODIUM) 2 MG capsule Take 1 capsule (2 mg total) by mouth daily. 30 capsule 1    magnesium oxide (MAG-OX) 400 (240 Mg) MG tablet Take 1 tablet (400 mg total) by mouth daily. 30 tablet 3    metFORMIN (GLUCOPHAGE) 500 MG tablet Take 2 tablets (1,000 mg total) by mouth 2 (two) times daily with a meal. 60 tablet 3    potassium chloride (KLOR-CON M) 10 MEQ tablet Take 1 tablet (10 mEq total) by mouth 2 (two) times daily. 30 tablet 0    rosuvastatin (CRESTOR) 40 MG tablet Take 40 mg by mouth daily.      Selenium Sulfide 2.25 % SHAM Apply 1 application  topically See admin instructions. Shampoo as directed      simethicone (MYLICON) 80 MG chewable tablet Chew 1 tablet (80 mg total) by mouth every 6 (six) hours as needed for flatulence (gas pains). 30 tablet 3    traMADol (ULTRAM) 50 MG tablet Take 1 tablet (50 mg total) by mouth every 6 (six) hours as needed for moderate pain or severe pain. For AFTER  surgery, do not take and drive 15 tablet 0    No current facility-administered medications for this encounter.   ROS  Review of Systems  Gastrointestinal:        RLQ ileostomy  Skin:  Positive for rash.       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    All other systems reviewed and are negative.  Vital signs:  BP (!) 133/90 (BP Location: Right Arm)   Pulse 91   Temp 97.9 F (36.6 C) (Oral)   Resp 20   SpO2 100%  Exam:  Physical Exam Vitals reviewed.  Constitutional:      Appearance: Normal appearance.  Abdominal:     Palpations: Abdomen is soft.  Skin:    General: Skin is warm and dry.     Findings: Lesion and rash present.  Neurological:     Mental Status: She is alert and oriented to person, place, and time.  Psychiatric:        Mood and Affect: Mood normal.        Behavior: Behavior normal.     Stoma type/location:  RLQ ileostomy Stomal assessment/size:  1 1/8" pink and moist  Peristomal assessment:  mucus fistula  points down and continues to remain challenging to keep dry.  We protect with powder and skin prep Treatment options for stomal/peristomal skin: barrier ring  1 piece convex  continue to place piece of barrier ring to midline surgical site where pouch barrier overlaps for protection. Area intact Output: soft brown. Ostomy pouching: 1pc. Convex with barrier ring  Education provided:  Continue pouching    Impression/dx  Contact dermatitis Discussion  Update orders with [facility] as needed Plan  Back to clinic as needed.  She is hopeful to have reversal in April. Looking forward to a destination wedding in [place].  Reminded her of presurgical clearance tests.     Visit time: 45 minutes.   Domenic Moras FNP-BC

## 2022-04-26 ENCOUNTER — Other Ambulatory Visit: Payer: Self-pay | Admitting: Gynecologic Oncology

## 2022-04-26 DIAGNOSIS — N7093 Salpingitis and oophoritis, unspecified: Secondary | ICD-10-CM

## 2022-04-26 DIAGNOSIS — Z9889 Other specified postprocedural states: Secondary | ICD-10-CM

## 2022-04-26 DIAGNOSIS — K9419 Other complications of enterostomy: Secondary | ICD-10-CM

## 2022-04-26 DIAGNOSIS — R198 Other specified symptoms and signs involving the digestive system and abdomen: Secondary | ICD-10-CM

## 2022-04-27 ENCOUNTER — Telehealth: Payer: Self-pay | Admitting: Oncology

## 2022-04-27 NOTE — Telephone Encounter (Signed)
Called Marie Hill and advised her of CT scan appointment on 06/14/22 with arrival at 8:00 am at [place] her instructions for NPO after 6 am that morning and discussed that she will  drink contrast at 8 am and 9 am before the scan and she will also received rectal contrast.  Also rescheduled Dr. Charisse March apt to 06/22/22 at 8:45 to discuss the CT results.  She verbalized understanding and agreement of appointments and instructions.

## 2022-06-02 ENCOUNTER — Ambulatory Visit: Payer: PRIVATE HEALTH INSURANCE | Admitting: Gynecologic Oncology

## 2022-06-13 ENCOUNTER — Telehealth: Payer: Self-pay | Admitting: *Deleted

## 2022-06-13 NOTE — Telephone Encounter (Signed)
Per Dr Pricilla Holm moved appt from 5/9 to 5/10

## 2022-06-14 ENCOUNTER — Ambulatory Visit (HOSPITAL_COMMUNITY)
Admission: RE | Admit: 2022-06-14 | Discharge: 2022-06-14 | Disposition: A | Discharge status: Home / Self Care | Payer: PRIVATE HEALTH INSURANCE | Source: Ambulatory Visit | Attending: Gynecologic Oncology | Admitting: Gynecologic Oncology

## 2022-06-14 DIAGNOSIS — N7093 Salpingitis and oophoritis, unspecified: Secondary | ICD-10-CM | POA: Diagnosis present

## 2022-06-14 DIAGNOSIS — R198 Other specified symptoms and signs involving the digestive system and abdomen: Secondary | ICD-10-CM | POA: Diagnosis present

## 2022-06-14 DIAGNOSIS — K9419 Other complications of enterostomy: Secondary | ICD-10-CM | POA: Diagnosis present

## 2022-06-14 DIAGNOSIS — Z932 Ileostomy status: Secondary | ICD-10-CM | POA: Insufficient documentation

## 2022-06-14 DIAGNOSIS — Z9889 Other specified postprocedural states: Secondary | ICD-10-CM

## 2022-06-14 MED ORDER — IOHEXOL 300 MG/ML  SOLN
100.0000 mL | Freq: Once | INTRAMUSCULAR | Status: AC | PRN
  Administered 2022-06-14: 80 mL via INTRAVENOUS

## 2022-06-14 MED ORDER — IOHEXOL 9 MG/ML PO SOLN
ORAL | Status: AC
  Filled 2022-06-14: qty 1000

## 2022-06-14 MED ORDER — IOHEXOL 9 MG/ML PO SOLN
500.0000 mL | ORAL | Status: AC
  Administered 2022-06-14 (×2): 500 mL via ORAL

## 2022-06-14 MED ORDER — SODIUM CHLORIDE (PF) 0.9 % IJ SOLN
INTRAMUSCULAR | Status: AC
  Filled 2022-06-14: qty 50

## 2022-06-22 ENCOUNTER — Ambulatory Visit: Payer: PRIVATE HEALTH INSURANCE | Admitting: Gynecologic Oncology

## 2022-06-23 ENCOUNTER — Other Ambulatory Visit: Payer: Self-pay

## 2022-06-23 ENCOUNTER — Encounter: Payer: Self-pay | Admitting: Gynecologic Oncology

## 2022-06-23 ENCOUNTER — Inpatient Hospital Stay: Payer: PRIVATE HEALTH INSURANCE | Admitting: Gynecologic Oncology

## 2022-06-23 ENCOUNTER — Inpatient Hospital Stay: Payer: PRIVATE HEALTH INSURANCE | Attending: Gynecologic Oncology | Admitting: Gynecologic Oncology

## 2022-06-23 ENCOUNTER — Other Ambulatory Visit: Payer: Self-pay | Admitting: Gynecologic Oncology

## 2022-06-23 ENCOUNTER — Telehealth: Payer: Self-pay | Admitting: Oncology

## 2022-06-23 VITALS — BP 119/79 | HR 80 | Temp 97.6°F | Wt 120.8 lb

## 2022-06-23 DIAGNOSIS — Z90710 Acquired absence of both cervix and uterus: Secondary | ICD-10-CM | POA: Diagnosis not present

## 2022-06-23 DIAGNOSIS — Z8742 Personal history of other diseases of the female genital tract: Secondary | ICD-10-CM | POA: Insufficient documentation

## 2022-06-23 DIAGNOSIS — Z9889 Other specified postprocedural states: Secondary | ICD-10-CM

## 2022-06-23 DIAGNOSIS — Z932 Ileostomy status: Secondary | ICD-10-CM

## 2022-06-23 DIAGNOSIS — Z90722 Acquired absence of ovaries, bilateral: Secondary | ICD-10-CM | POA: Diagnosis not present

## 2022-06-23 MED ORDER — TRAMADOL HCL 50 MG PO TABS
50.0000 mg | ORAL_TABLET | Freq: Four times a day (QID) | ORAL | 0 refills | Status: DC | PRN
Start: 2022-06-23 — End: 2022-08-08

## 2022-06-23 MED ORDER — IBUPROFEN 800 MG PO TABS
800.0000 mg | ORAL_TABLET | Freq: Three times a day (TID) | ORAL | 0 refills | Status: DC | PRN
Start: 2022-06-23 — End: 2022-08-08

## 2022-06-23 NOTE — Patient Instructions (Addendum)
We will reach out to your insurance to look into coverage etc. We will update you on this information.  Preparing for your Surgery   Plan for surgery on Jul 12, 2022 with Dr. Eugene Garnet at [facility]. You will be scheduled for ostomy reversal.    Plan for stay in the hospital for 1-4 days, based on bowel return.   Pre-operative Testing -You will receive a phone call from presurgical testing at [facility] to arrange for a pre-operative appointment and lab work.   -Bring your insurance card, copy of an advanced directive if applicable, medication list   -At that visit, you will be asked to sign a consent for a possible blood transfusion in case a transfusion becomes necessary during surgery.  The need for a blood transfusion is rare but having consent is a necessary part of your care.      -You should not be taking blood thinners or aspirin at least ten days prior to surgery unless instructed by your surgeon.   -Do not take supplements such as fish oil (omega 3), red yeast rice, turmeric before your surgery. You want to avoid medications with aspirin in them including headache powders such as BC or Goody's), Excedrin migraine.   Day Before Surgery at Home -You will be asked to take in a light diet the day before surgery. You will be advised you can have clear liquids up until 3 hours before your surgery.     Eat a light diet the day before surgery.  Examples including soups, broths, toast, yogurt, mashed potatoes.  AVOID GAS PRODUCING FOODS AND BEVERAGES. Things to avoid include carbonated beverages (fizzy beverages, sodas), raw fruits and raw vegetables (uncooked), or beans.    If your bowels are filled with gas, your surgeon will have difficulty visualizing your pelvic organs which increases your surgical risks.   Your role in recovery Your role is to become active as soon as directed by your doctor, while still giving yourself time to heal.  Rest when you feel  tired. You will be asked to do the following in order to speed your recovery:   - Cough and breathe deeply. This helps to clear and expand your lungs and can prevent pneumonia after surgery.  - STAY ACTIVE WHEN YOU GET HOME. Do mild physical activity. Walking or moving your legs help your circulation and body functions return to normal. Do not try to get up or walk alone the first time after surgery.   -If you develop swelling on one leg or the other, pain in the back of your leg, redness/warmth in one of your legs, please call the office or go to the Emergency Room to have a doppler to rule out a blood clot. For shortness of breath, chest pain-seek care in the Emergency Room as soon as possible. - Actively manage your pain. Managing your pain lets you move in comfort. We will ask you to rate your pain on a scale of zero to 10. It is your responsibility to tell your doctor or nurse where and how much you hurt so your pain can be treated.   Special Considerations -If you are diabetic, you may be placed on insulin after surgery to have closer control over your blood sugars to promote healing and recovery.  This does not mean that you will be discharged on insulin.  If applicable, your oral antidiabetics will be resumed when you are tolerating a solid diet.   -Your final pathology  results from surgery should be available around one week after surgery and the results will be relayed to you when available.   -Dr. Antionette Char is the surgeon that assists your GYN Oncologist with surgery.  If you end up staying the night, the next day after your surgery you will either see Dr. Pricilla Holm, Dr. Alvester Morin, or Dr. Antionette Char.   -FMLA forms can be faxed to [phone] and please allow 5-7 business days for completion.   Pain Management After Surgery -Make sure that you have Tylenol and ibuprofen IF YOU ARE ABLE TO TAKE THESE MEDICATION at home to use on a regular basis after surgery for pain control.     -Review the attached handout on narcotic use and their risks and side effects.    Bowel Regimen -It is important to prevent constipation and drink adequate amounts of liquids. We typically will avoid laxative use after surgery but you can use fiber like metamucil.   Risks of Surgery Risks of surgery are low but include bleeding, infection, damage to surrounding structures, re-operation, blood clots, and very rarely death.     Blood Transfusion Information (For the consent to be signed before surgery)   We will be checking your blood type before surgery so in case of emergencies, we will know what type of blood you would need.                                             WHAT IS A BLOOD TRANSFUSION?   A transfusion is the replacement of blood or some of its parts. Blood is made up of multiple cells which provide different functions. Red blood cells carry oxygen and are used for blood loss replacement. White blood cells fight against infection. Platelets control bleeding. Plasma helps clot blood. Other blood products are available for specialized needs, such as hemophilia or other clotting disorders. BEFORE THE TRANSFUSION  Who gives blood for transfusions?  You may be able to donate blood to be used at a later date on yourself (autologous donation). Relatives can be asked to donate blood. This is generally not any safer than if you have received blood from a stranger. The same precautions are taken to ensure safety when a relative's blood is donated. Healthy volunteers who are fully evaluated to make sure their blood is safe. This is blood bank blood. Transfusion therapy is the safest it has ever been in the practice of medicine. Before blood is taken from a donor, a complete history is taken to make sure that person has no history of diseases nor engages in risky social behavior (examples are intravenous drug use or sexual activity with multiple partners). The donor's travel history is  screened to minimize risk of transmitting infections, such as malaria. The donated blood is tested for signs of infectious diseases, such as HIV and hepatitis. The blood is then tested to be sure it is compatible with you in order to minimize the chance of a transfusion reaction. If you or a relative donates blood, this is often done in anticipation of surgery and is not appropriate for emergency situations. It takes many days to process the donated blood. RISKS AND COMPLICATIONS Although transfusion therapy is very safe and saves many lives, the main dangers of transfusion include:  Getting an infectious disease. Developing a transfusion reaction. This is an allergic reaction to something in the  blood you were given. Every precaution is taken to prevent this. The decision to have a blood transfusion has been considered carefully by your caregiver before blood is given. Blood is not given unless the benefits outweigh the risks.   AFTER SURGERY INSTRUCTIONS   Return to work: 4-6 weeks if applicable   Activity: 1. Be up and out of the bed during the day.  Take a nap if needed.  You may walk up steps but be careful and use the hand rail.  Stair climbing will tire you more than you think, you may need to stop part way and rest.    2. No lifting or straining for 6 weeks over 10 pounds. No pushing, pulling, straining for 6 weeks.   3. No driving for around 1 week(s).  Do not drive if you are taking narcotic pain medicine and make sure that your reaction time has returned.    4. You can shower as soon as the next day after surgery. Shower daily.  Use your regular soap and water (not directly on the incision) and pat your incision(s) dry afterwards; don't rub.  No tub baths or submerging your body in water until cleared by your surgeon. If you have the soap that was given to you by pre-surgical testing that was used before surgery, you do not need to use it afterwards because this can irritate your  incisions.    5. You may experience a small amount of clear drainage from your incision, which is normal.  If the drainage persists, increases, or changes color please call the office.   6. Do not use creams, lotions, or ointments such as neosporin on your incision after surgery until advised by your surgeon.   7. Take Tylenol first for pain if you are able to take these medication and only use narcotic pain medication for severe pain not relieved by the Tylenol.  Monitor your Tylenol intake to a max of 4,000 mg in a 24 hour period.    Diet: 1. Low sodium Heart Healthy Diet is recommended but you are cleared to resume your normal (before surgery) diet after your procedure.   Wound Care: 1. Keep clean and dry.  Shower daily.   Reasons to call the Doctor: Fever - Oral temperature greater than 100.4 degrees Fahrenheit Foul-smelling vaginal discharge Difficulty urinating Nausea and vomiting Increased pain at the site of the incision that is unrelieved with pain medicine. Difficulty breathing with or without chest pain New calf pain especially if only on one side Sudden, continuing increased vaginal bleeding with or without clots.   Contacts: For questions or concerns you should contact:   Dr. Eugene Garnet at [phone]   Warner Mccreedy, NP at [phone]   After Hours: call [phone] and have the GYN Oncologist paged/contacted (after 5 pm or on the weekends). You will speak with an after hours RN and let he or she know you have had surgery.   Messages sent via mychart are for non-urgent matters and are not responded to after hours so for urgent needs, please call the after hours number.

## 2022-06-23 NOTE — Progress Notes (Signed)
Gynecologic Oncology Return Clinic Visit  06/23/22  Reason for Visit: treatment planning  Treatment History: The patient was initially referred to Dr. 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 A pelvic mass was identified on pelvic examination and an ultrasound obtained showing:   Uterus measures 6.8 x 5.5 x 4.8 cm endometrial stripe 10.9 mm (last menstrual period 08/18/2014). The right ovary will there was a corpus luteum cyst was measured 14 x 16 x 14 mm with noted color flow in the periphery. Left ovary not seen but a large adnexal mass measuring 12.1 x 9.4 x 8.4 cm defect using internal low level echoes homogeneous echoes along with a solid calcification in the mass which measured 3.6 x 1.3 x 2.0 cm with arterial blood flow seen within the wall of the mass. There was no fluid in the cul-de-sac.   Follow-up CT scan was obtained showing: 1. 11.8 cm multiloculated cystic mass in the right adnexal space, concerning for neoplasm. While there is some mild smooth wall thickening along the medial margin of the lesion, there is no evidence for mural nodule or septal irregularity/thickening by CT and this lesion may be benign. 2. No evidence for ascites. 3. No lymphadenopathy in the pelvis.   CA-125 was 164 units per mL. Roma score was 2.83 (normal for a premenopausal woman is less than 1.31)   09/2014: Robotic-assisted left salpingo-oophorectomy with lysis of adhesions x 2 hours and left ureterolysis for retroperitoneal fibrosis obliterating the pararectal space. Findings at the time of surgery included retroperitoneal left ovarian mass measuring 12cm, replacing left ovary, densely adherent to sigmoid colon, left ovarian fossa and uterus and infiltrating into the retroperitoneum on the left causing adherence to the left ureter which required extensive ureterolysis to mobilize off the mass. The rectum was adherent to the posterior uterine lower segment and cervix and the right ovary was grossly  normal with a corpus luteum. The right fallopian tube was slightly clubbed. Left ovarian cyst consistent with endometrioma with chocolate colored fluid.  Pathology confirmed endometrioma/endometriosis.   After this surgery, the patient underwent HSG which documented patency of her right fallopian tube.  She then conceived spontaneously in April 2017.  This resulted in a 6-week miscarriage.  She underwent stimulation cycles with IUI in 2019 but did not conceive.  She then spontaneously conceived in October 2019, again resulting in a early pregnancy loss.  She required Vasoprost all for retained products of conception.   Subsequently, she had 3 IVF cycles, none of which produced viable and genetically normal embryos.  The last of these cycles was in 2022.   Patient was seen by Dr. April Manson at the end of August 2023 to discuss possibility of fourth IVF cycle.  Labs at that initial visit included a CA125 of 31.2.  At some point subsequently, she underwent cyst aspiration in preparation for upcoming IVF cycle.  She had been taking Orilissa for the last month before this procedure happened.  She underwent transvaginal ultrasound guided ovarian endometrioma aspiration with 24 mm endometrioma aspirated.  She had doxycycline prophylaxis for this procedure and notes indicate that she was prescribed 5 days of doxycycline after the procedure.   The patient was seen back in clinic in early November.  She had abdominal pain at this time after stopping her Dewayne Hatch 1 week previously after running out of medication.  Pelvic ultrasound at that time showed a right endometrioma measuring 4 x 3.1 x 3.7 cm and a resolving left anechoic cystic structure measuring up to  2.3 cm.  No free fluid noted in the pelvis.  Started on Aygestin and Solara for 2 weeks to help suppress estrogen.     Plan had been for her to be on medications for 2 weeks and then to follow-up in clinic.  Given pain and fevers, she presented to the  emergency department about a week after this visit.  In the emergency department, the patient was noted to have leukocytosis of 25.  She was febrile and tachycardic on admission.  Cultures were drawn.  CT scan and ultrasound were performed with results noted below.   12/26/21: CT A/P 1. Complex, multi-septated cystic right adnexal mass measuring up to 9.2 cm in diameter, new compared with previous CT from 2016. Appearance is nonspecific and could reflect an atypical endometrioma or tubo-ovarian abscess. Correlate with recent surgical procedure/findings and presumed outside imaging. Neoplasm not excluded by this examination. Ultrasound may be helpful for further characterization. 2. Mild pelvic soft tissue edema without focal fluid collection, ascites or free air. The appendix is not clearly demonstrated. 3. Mildly prominent retroperitoneal lymph nodes, likely reactive. 4. No acute findings in the chest.   12/26/21: Pelvic ultrasound  There is marked enlargement of right ovary. There are multiple smooth marginated lesions of varying sizes largest measuring 6.6 cm in maximum diameter. Findings suggestive multiple hemorrhagic right ovarian cysts. There is no evidence of right ovarian torsion. Left ovary is surgically absent. There is inhomogeneous echogenicity in myometrium without no discrete focal abnormalities.   Patient was admitted and treated with IV cefepime, flagyl, vancomycin. Urine culture was negative negative, wet prep showed clue cells, blood cultures were also negative. She was discharged home on Augmentin and doxycycline for 10 days.   She was seen for follow-up on November 29.  Pelvic ultrasound performed in clinic showed a right tubo-ovarian complex adherent to the uterus measured 10.6 x 8.8 x 7.8 cm.   01/13/22: Patient was seen in my office and secondary to abdominal pain, tachycardia, and fevers over the last couple of weeks, patient sent to the emergency department for admission.    12/1-12/8/23: Patient admitted, started on IV Zosyn.  Given outpatient treatment failure in size of mass, interventional radiology consulted.  Drain placed in the largest cystic area of the ovary.  Gram stain showed rare WBC, few gram-positive cocci and rare gram-negative rods.  Culture revealed moderate PREVOTELLA BIVIA.  Given improved symptoms and leukocytosis, patient transitioned from Zosyn to levofloxacin and Flagyl.  Unfortunately, she did not tolerate this and was transitioned to Augmentin.  Given worsening clinical symptoms as well as worsening leukocytosis, she was transition back to Zosyn and infectious diseases was consulted.  Decision made to discharge her on prolonged course of IV Zosyn with interval plan for surgery.  She presented for follow-up on 01/27/22 at which time the patient noted that she had worsened at home in the past day with fevers up to 101 and feeling achy and weak. She also noted some nausea and decreased appetite. Given increased WBC, worsening symptoms despite IV antibiotics, recommendation was made for admission and definitive surgical management. Plan had initially been for admission at [facility], however, her insurance was out of network. As a result she was sent to the emergency room for CT scan, continuation of antibiotics, admission, and ultimately surgical intervention.  01/30/22: Cystoscopy with right ureteral stent placement (Dr. Liliane Shi); exploratory laparotomy with en bloc resection including total hysterectomy, right salpingo-oophorectomy, appendectomy, and rectosigmoid resection, diverting loop ileostomy, and cystoscopy   She had  a prolonged course postop was complicated by development of a partial SBO. She required an NG tube for bowel rest and decompression on 2 separate occasions as well as TPN for 6 days.  She was ultimately discharged on postop day 22.  Interval History: Patient reports doing very well.  Continues to have good output from her ostomy, takes  Imodium once a day.  Is drinking and eating a lot.  Has been able to gain 15 pounds since her last visit with [place] in January.  Denies any urinary symptoms.  Denies any vaginal bleeding.  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   no meds 1997, treated in June 2016 x 1 month- states neg chest x ray June 2016    Past Surgical History:  Procedure Laterality Date   CYSTOSCOPY W/ URETERAL STENT PLACEMENT Right 01/30/2022   Procedure: CYSTOSCOPY WITH RETROGRADE PYELOGRAM/URETERAL STENT PLACEMENT;  Surgeon: Rene Paci, MD;  Location: [facility];  Service: Urology;  Laterality: Right;   LAPAROSCOPIC UNILATERAL SALPINGO OOPHERECTOMY N/A 09/29/2014   Procedure: LAPAROSCOPIC LEFT SALPINGO OOPHORECTOMY ADHESIOLYSIS ;  Surgeon: Adolphus Birchwood, MD;  Location: [facility];  Service: Gynecology;  Laterality: N/A;   ROBOTIC ASSISTED LAPAROSCOPIC OVARIAN CYSTECTOMY N/A 09/29/2014   Procedure: SI ROBOTIC ASSISTED LAPAROSCOPIC OVARIAN CYSTECTOMY;  Surgeon: Adolphus Birchwood, MD;  Location: [facility];  Service: Gynecology;  Laterality: N/A;   SALPINGOOPHORECTOMY N/A 01/30/2022   Procedure: OPEN unilateral SALPINGO OOPHORECTOMY,  hysterectomy  rectosigmoid resection and ileostomy;  Surgeon: Carver Fila, MD;  Location: [facility];  Service: Gynecology;  Laterality: N/A;   TYMPANOPLASTY Left 08/19/2019   Procedure: LEFT TYMPANOPLASTY;  Surgeon: Drema Halon, MD;  Location: [facility];  Service: ENT;  Laterality: Left;    Family History  Problem Relation Age of Onset   Hyperlipidemia Mother    Hypertension Mother    Hyperlipidemia Father     Social History   Socioeconomic History    Marital status: Married    Spouse name: Not on file   Number of children: 0   Years of education: Not on file   Highest education level: Not on file  Occupational History   Occupation: Merchandiser, retail  Tobacco Use   Smoking status: Never   Smokeless tobacco: Never  Vaping Use   Vaping Use: Never used  Substance and Sexual Activity   Alcohol use: Not Currently    Alcohol/week: 0.0 standard drinks of alcohol   Drug use: No   Sexual activity: Not Currently    Partners: Male    Birth control/protection: None  Other Topics Concern   Not on file  Social History Narrative   Marital status:  Single; together x 8 years.      Children: none      Lives: with boyfriend; from [place]; moved to [place] at age 39.      Employment:  Dentist.      Tobacco: none      Alcohol: weekends      Drugs: none      Exercise:  Sporadic      Seatbelt: 100%; no texting   Social Determinants  of Health   Financial Resource Strain: Not on file  Food Insecurity: No Food Insecurity (01/28/2022)   Hunger Vital Sign    Worried About Running Out of Food in the Last Year: Never true    Ran Out of Food in the Last Year: Never true  Transportation Needs: No Transportation Needs (01/28/2022)   PRAPARE - Administrator, Civil Service (Medical): No    Lack of Transportation (Non-Medical): No  Physical Activity: Not on file  Stress: Not on file  Social Connections: Not on file    Current Medications:  Current Outpatient Medications:    ibuprofen (ADVIL) 800 MG tablet, Take 1 tablet (800 mg total) by mouth every 8 (eight) hours as needed for moderate pain. For AFTER surgery only, Disp: 30 tablet, Rfl: 0   amLODipine (NORVASC) 10 MG tablet, Take 1 tablet (10 mg total) by mouth daily., Disp: 30 tablet, Rfl: 3   benzonatate (TESSALON) 100 MG capsule, Take 1 capsule (100 mg total) by mouth 3 (three) times daily as needed for cough., Disp: 20 capsule, Rfl: 0    loperamide (IMODIUM) 2 MG capsule, Take 1 capsule (2 mg total) by mouth daily., Disp: 30 capsule, Rfl: 1   magnesium oxide (MAG-OX) 400 (240 Mg) MG tablet, Take 1 tablet (400 mg total) by mouth daily., Disp: 30 tablet, Rfl: 3   metFORMIN (GLUCOPHAGE) 500 MG tablet, Take 2 tablets (1,000 mg total) by mouth 2 (two) times daily with a meal., Disp: 60 tablet, Rfl: 3   potassium chloride (KLOR-CON M) 10 MEQ tablet, Take 1 tablet (10 mEq total) by mouth 2 (two) times daily., Disp: 30 tablet, Rfl: 0   rosuvastatin (CRESTOR) 40 MG tablet, Take 40 mg by mouth daily., Disp: , Rfl:    Selenium Sulfide 2.25 % SHAM, Apply 1 application  topically See admin instructions. Shampoo as directed, Disp: , Rfl:    simethicone (MYLICON) 80 MG chewable tablet, Chew 1 tablet (80 mg total) by mouth every 6 (six) hours as needed for flatulence (gas pains)., Disp: 30 tablet, Rfl: 3   traMADol (ULTRAM) 50 MG tablet, Take 1 tablet (50 mg total) by mouth every 6 (six) hours as needed for moderate pain or severe pain. For AFTER surgery, do not take and drive, Disp: 15 tablet, Rfl: 0  Review of Systems: Denies appetite changes, fevers, chills, fatigue, unexplained weight changes. Denies hearing loss, neck lumps or masses, mouth sores, ringing in ears or voice changes. Denies cough or wheezing.  Denies shortness of breath. Denies chest pain or palpitations. Denies leg swelling. Denies abdominal distention, pain, blood in stools, constipation, diarrhea, nausea, vomiting, or early satiety. Denies pain with intercourse, dysuria, frequency, hematuria or incontinence. Denies hot flashes, pelvic pain, vaginal bleeding or vaginal discharge.   Denies joint pain, back pain or muscle pain/cramps. Denies itching, rash, or wounds. Denies dizziness, headaches, numbness or seizures. Denies swollen lymph nodes or glands, denies easy bruising or bleeding. Denies anxiety, depression, confusion, or decreased concentration.  Physical Exam: BP  119/79 (BP Location: Right Arm, Patient Position: Sitting)   Pulse 80   Temp 97.6 F (36.4 C) (Oral)   Wt 120 lb 12.8 oz (54.8 kg)   SpO2 100%   BMI 22.23 kg/m  General: Alert, oriented, no acute distress. HEENT: Normocephalic, atraumatic, sclera anicteric. Chest: Clear to auscultation bilaterally.  No wheezes or rhonchi. Cardiovascular: Regular rate and rhythm, no murmurs. Abdomen: soft, nontender.  Normoactive bowel sounds.  No masses or hepatosplenomegaly appreciated.  Well-healed  incisions.  Ostomy in place, pink, viable.  Laboratory & Radiologic Studies: CT A/P on 5/1: Status post left hemicolectomy with diverting right mid abdominal ileostomy.   Status post rectal contrast administration, without extraluminal contrast visualized.  Assessment & Plan: Marie Hill is a 42 y.o. woman with history of endometriosis who developed TOA after outpatient REI procedure (transvaginal endometrioma drainage) that failed outpatient therapy requiring extensive surgery in 01/2022 including rectosigmoid resection with diverting ileostomy now presenting for reversal.  Patient is doing very well.  We discussed plan for reversal surgery later this month.  She has recently switched insurance and was told when she came for CT scan at [facility] is now out of network.  I will have my team look into this.  Given her very complex course and that this is the last portion of her care, I think that there are many reasons supporting the patient having surgery here at this hospital with me.  We discussed need for inpatient stay after.  I suspect that she will require admission for 2-5 days.  We reviewed the plan for a Ileostomy reversal, possible laparotomy. The risks of surgery were discussed in detail and she understands these to include infection; wound separation; hernia; injury to adjacent organs; bleeding which may require blood transfusion; anesthesia risk; thromboembolic events; possible death; unforeseen  complications; possible need for re-exploration; medical complications such as heart attack, stroke, pleural effusion and pneumonia. The patient will receive DVT and antibiotic prophylaxis as indicated. She voiced a clear understanding. She had the opportunity to ask questions. Perioperative instructions were reviewed with her. Prescriptions for post-op medications were sent to her pharmacy of choice.   28 minutes of total time was spent for this patient encounter, including preparation, face-to-face counseling with the patient and coordination of care, and documentation of the encounter.  Eugene Garnet, MD  Division of Gynecologic Oncology  Department of Obstetrics and Gynecology  [facility]

## 2022-06-23 NOTE — Telephone Encounter (Signed)
Thanks Karen

## 2022-06-23 NOTE — Patient Instructions (Signed)
We will reach out to your insurance to look into coverage etc. We will update you on this information.  Preparing for your Surgery   Plan for surgery on Jul 12, 2022 with Dr. Katherine Tucker at [facility]. You will be scheduled for ostomy reversal.    Plan for stay in the hospital for 1-4 days, based on bowel return.   Pre-operative Testing -You will receive a phone call from presurgical testing at [facility] to arrange for a pre-operative appointment and lab work.   -Bring your insurance card, copy of an advanced directive if applicable, medication list   -At that visit, you will be asked to sign a consent for a possible blood transfusion in case a transfusion becomes necessary during surgery.  The need for a blood transfusion is rare but having consent is a necessary part of your care.      -You should not be taking blood thinners or aspirin at least ten days prior to surgery unless instructed by your surgeon.   -Do not take supplements such as fish oil (omega 3), red yeast rice, turmeric before your surgery. You want to avoid medications with aspirin in them including headache powders such as BC or Goody's), Excedrin migraine.   Day Before Surgery at Home -You will be asked to take in a light diet the day before surgery. You will be advised you can have clear liquids up until 3 hours before your surgery.     Eat a light diet the day before surgery.  Examples including soups, broths, toast, yogurt, mashed potatoes.  AVOID GAS PRODUCING FOODS AND BEVERAGES. Things to avoid include carbonated beverages (fizzy beverages, sodas), raw fruits and raw vegetables (uncooked), or beans.    If your bowels are filled with gas, your surgeon will have difficulty visualizing your pelvic organs which increases your surgical risks.   Your role in recovery Your role is to become active as soon as directed by your doctor, while still giving yourself time to heal.  Rest when you feel  tired. You will be asked to do the following in order to speed your recovery:   - Cough and breathe deeply. This helps to clear and expand your lungs and can prevent pneumonia after surgery.  - STAY ACTIVE WHEN YOU GET HOME. Do mild physical activity. Walking or moving your legs help your circulation and body functions return to normal. Do not try to get up or walk alone the first time after surgery.   -If you develop swelling on one leg or the other, pain in the back of your leg, redness/warmth in one of your legs, please call the office or go to the Emergency Room to have a doppler to rule out a blood clot. For shortness of breath, chest pain-seek care in the Emergency Room as soon as possible. - Actively manage your pain. Managing your pain lets you move in comfort. We will ask you to rate your pain on a scale of zero to 10. It is your responsibility to tell your doctor or nurse where and how much you hurt so your pain can be treated.   Special Considerations -If you are diabetic, you may be placed on insulin after surgery to have closer control over your blood sugars to promote healing and recovery.  This does not mean that you will be discharged on insulin.  If applicable, your oral antidiabetics will be resumed when you are tolerating a solid diet.   -Your final pathology   results from surgery should be available around one week after surgery and the results will be relayed to you when available.   -Dr. Lisa Jackson-Moore is the surgeon that assists your GYN Oncologist with surgery.  If you end up staying the night, the next day after your surgery you will either see Dr. Tucker, Dr. Newton, or Dr. Lisa Jackson-Moore.   -FMLA forms can be faxed to [phone] and please allow 5-7 business days for completion.   Pain Management After Surgery -Make sure that you have Tylenol and ibuprofen IF YOU ARE ABLE TO TAKE THESE MEDICATION at home to use on a regular basis after surgery for pain control.     -Review the attached handout on narcotic use and their risks and side effects.    Bowel Regimen -It is important to prevent constipation and drink adequate amounts of liquids. We typically will avoid laxative use after surgery but you can use fiber like metamucil.   Risks of Surgery Risks of surgery are low but include bleeding, infection, damage to surrounding structures, re-operation, blood clots, and very rarely death.     Blood Transfusion Information (For the consent to be signed before surgery)   We will be checking your blood type before surgery so in case of emergencies, we will know what type of blood you would need.                                             WHAT IS A BLOOD TRANSFUSION?   A transfusion is the replacement of blood or some of its parts. Blood is made up of multiple cells which provide different functions. Red blood cells carry oxygen and are used for blood loss replacement. White blood cells fight against infection. Platelets control bleeding. Plasma helps clot blood. Other blood products are available for specialized needs, such as hemophilia or other clotting disorders. BEFORE THE TRANSFUSION  Who gives blood for transfusions?  You may be able to donate blood to be used at a later date on yourself (autologous donation). Relatives can be asked to donate blood. This is generally not any safer than if you have received blood from a stranger. The same precautions are taken to ensure safety when a relative's blood is donated. Healthy volunteers who are fully evaluated to make sure their blood is safe. This is blood bank blood. Transfusion therapy is the safest it has ever been in the practice of medicine. Before blood is taken from a donor, a complete history is taken to make sure that person has no history of diseases nor engages in risky social behavior (examples are intravenous drug use or sexual activity with multiple partners). The donor's travel history is  screened to minimize risk of transmitting infections, such as malaria. The donated blood is tested for signs of infectious diseases, such as HIV and hepatitis. The blood is then tested to be sure it is compatible with you in order to minimize the chance of a transfusion reaction. If you or a relative donates blood, this is often done in anticipation of surgery and is not appropriate for emergency situations. It takes many days to process the donated blood. RISKS AND COMPLICATIONS Although transfusion therapy is very safe and saves many lives, the main dangers of transfusion include:  Getting an infectious disease. Developing a transfusion reaction. This is an allergic reaction to something in the   blood you were given. Every precaution is taken to prevent this. The decision to have a blood transfusion has been considered carefully by your caregiver before blood is given. Blood is not given unless the benefits outweigh the risks.   AFTER SURGERY INSTRUCTIONS   Return to work: 4-6 weeks if applicable   Activity: 1. Be up and out of the bed during the day.  Take a nap if needed.  You may walk up steps but be careful and use the hand rail.  Stair climbing will tire you more than you think, you may need to stop part way and rest.    2. No lifting or straining for 6 weeks over 10 pounds. No pushing, pulling, straining for 6 weeks.   3. No driving for around 1 week(s).  Do not drive if you are taking narcotic pain medicine and make sure that your reaction time has returned.    4. You can shower as soon as the next day after surgery. Shower daily.  Use your regular soap and water (not directly on the incision) and pat your incision(s) dry afterwards; don't rub.  No tub baths or submerging your body in water until cleared by your surgeon. If you have the soap that was given to you by pre-surgical testing that was used before surgery, you do not need to use it afterwards because this can irritate your  incisions.    5. You may experience a small amount of clear drainage from your incision, which is normal.  If the drainage persists, increases, or changes color please call the office.   6. Do not use creams, lotions, or ointments such as neosporin on your incision after surgery until advised by your surgeon.   7. Take Tylenol first for pain if you are able to take these medication and only use narcotic pain medication for severe pain not relieved by the Tylenol.  Monitor your Tylenol intake to a max of 4,000 mg in a 24 hour period.    Diet: 1. Low sodium Heart Healthy Diet is recommended but you are cleared to resume your normal (before surgery) diet after your procedure.   Wound Care: 1. Keep clean and dry.  Shower daily.   Reasons to call the Doctor: Fever - Oral temperature greater than 100.4 degrees Fahrenheit Foul-smelling vaginal discharge Difficulty urinating Nausea and vomiting Increased pain at the site of the incision that is unrelieved with pain medicine. Difficulty breathing with or without chest pain New calf pain especially if only on one side Sudden, continuing increased vaginal bleeding with or without clots.   Contacts: For questions or concerns you should contact:   Dr. Katherine Tucker at [phone]   Domique Reardon, NP at [phone]   After Hours: call [phone] and have the GYN Oncologist paged/contacted (after 5 pm or on the weekends). You will speak with an after hours RN and let he or she know you have had surgery.   Messages sent via mychart are for non-urgent matters and are not responded to after hours so for urgent needs, please call the after hours number. 

## 2022-06-23 NOTE — Telephone Encounter (Signed)
Called [employer] and [place] is not in network.  Asked if we could get a gap exception for Marie Hill's upcoming surgery and they said she may be able to get a continuance of care.  They advised to submit a prior authorization for surgery to see if the surgery will be authorized and go from there.  Also asked if the [facility] system was in network but they require an NPI and tax ID number to check the status.

## 2022-06-23 NOTE — H&P (View-Only) (Signed)
Gynecologic Oncology Return Clinic Visit  06/23/22  Reason for Visit: treatment planning  Treatment History: The patient was initially referred to Dr. 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 A pelvic mass was identified on pelvic examination and an ultrasound obtained showing:   Uterus measures 6.8 x 5.5 x 4.8 cm endometrial stripe 10.9 mm (last menstrual period 08/18/2014). The right ovary will there was a corpus luteum cyst was measured 14 x 16 x 14 mm with noted color flow in the periphery. Left ovary not seen but a large adnexal mass measuring 12.1 x 9.4 x 8.4 cm defect using internal low level echoes homogeneous echoes along with a solid calcification in the mass which measured 3.6 x 1.3 x 2.0 cm with arterial blood flow seen within the wall of the mass. There was no fluid in the cul-de-sac.   Follow-up CT scan was obtained showing: 1. 11.8 cm multiloculated cystic mass in the right adnexal space, concerning for neoplasm. While there is some mild smooth wall thickening along the medial margin of the lesion, there is no evidence for mural nodule or septal irregularity/thickening by CT and this lesion may be benign. 2. No evidence for ascites. 3. No lymphadenopathy in the pelvis.   CA-125 was 164 units per mL. Roma score was 2.83 (normal for a premenopausal woman is less than 1.31)   09/2014: Robotic-assisted left salpingo-oophorectomy with lysis of adhesions x 2 hours and left ureterolysis for retroperitoneal fibrosis obliterating the pararectal space. Findings at the time of surgery included retroperitoneal left ovarian mass measuring 12cm, replacing left ovary, densely adherent to sigmoid colon, left ovarian fossa and uterus and infiltrating into the retroperitoneum on the left causing adherence to the left ureter which required extensive ureterolysis to mobilize off the mass. The rectum was adherent to the posterior uterine lower segment and cervix and the right ovary was grossly  normal with a corpus luteum. The right fallopian tube was slightly clubbed. Left ovarian cyst consistent with endometrioma with chocolate colored fluid.  Pathology confirmed endometrioma/endometriosis.   After this surgery, the patient underwent HSG which documented patency of her right fallopian tube.  She then conceived spontaneously in April 2017.  This resulted in a 6-week miscarriage.  She underwent stimulation cycles with IUI in 2019 but did not conceive.  She then spontaneously conceived in October 2019, again resulting in a early pregnancy loss.  She required Vasoprost all for retained products of conception.   Subsequently, she had 3 IVF cycles, none of which produced viable and genetically normal embryos.  The last of these cycles was in 2022.   Patient was seen by Dr. Yalcinkaya at the end of August 2023 to discuss possibility of fourth IVF cycle.  Labs at that initial visit included a CA125 of 31.2.  At some point subsequently, she underwent cyst aspiration in preparation for upcoming IVF cycle.  She had been taking Orilissa for the last month before this procedure happened.  She underwent transvaginal ultrasound guided ovarian endometrioma aspiration with 24 mm endometrioma aspirated.  She had doxycycline prophylaxis for this procedure and notes indicate that she was prescribed 5 days of doxycycline after the procedure.   The patient was seen back in clinic in early November.  She had abdominal pain at this time after stopping her Orilissa 1 week previously after running out of medication.  Pelvic ultrasound at that time showed a right endometrioma measuring 4 x 3.1 x 3.7 cm and a resolving left anechoic cystic structure measuring up to   2.3 cm.  No free fluid noted in the pelvis.  Started on Aygestin and Solara for 2 weeks to help suppress estrogen.     Plan had been for her to be on medications for 2 weeks and then to follow-up in clinic.  Given pain and fevers, she presented to the  emergency department about a week after this visit.  In the emergency department, the patient was noted to have leukocytosis of 25.  She was febrile and tachycardic on admission.  Cultures were drawn.  CT scan and ultrasound were performed with results noted below.   12/26/21: CT A/P 1. Complex, multi-septated cystic right adnexal mass measuring up to 9.2 cm in diameter, new compared with previous CT from 2016. Appearance is nonspecific and could reflect an atypical endometrioma or tubo-ovarian abscess. Correlate with recent surgical procedure/findings and presumed outside imaging. Neoplasm not excluded by this examination. Ultrasound may be helpful for further characterization. 2. Mild pelvic soft tissue edema without focal fluid collection, ascites or free air. The appendix is not clearly demonstrated. 3. Mildly prominent retroperitoneal lymph nodes, likely reactive. 4. No acute findings in the chest.   12/26/21: Pelvic ultrasound  There is marked enlargement of right ovary. There are multiple smooth marginated lesions of varying sizes largest measuring 6.6 cm in maximum diameter. Findings suggestive multiple hemorrhagic right ovarian cysts. There is no evidence of right ovarian torsion. Left ovary is surgically absent. There is inhomogeneous echogenicity in myometrium without no discrete focal abnormalities.   Patient was admitted and treated with IV cefepime, flagyl, vancomycin. Urine culture was negative negative, wet prep showed clue cells, blood cultures were also negative. She was discharged home on Augmentin and doxycycline for 10 days.   She was seen for follow-up on November 29.  Pelvic ultrasound performed in clinic showed a right tubo-ovarian complex adherent to the uterus measured 10.6 x 8.8 x 7.8 cm.   01/13/22: Patient was seen in my office and secondary to abdominal pain, tachycardia, and fevers over the last couple of weeks, patient sent to the emergency department for admission.    12/1-12/8/23: Patient admitted, started on IV Zosyn.  Given outpatient treatment failure in size of mass, interventional radiology consulted.  Drain placed in the largest cystic area of the ovary.  Gram stain showed rare WBC, few gram-positive cocci and rare gram-negative rods.  Culture revealed moderate PREVOTELLA BIVIA.  Given improved symptoms and leukocytosis, patient transitioned from Zosyn to levofloxacin and Flagyl.  Unfortunately, she did not tolerate this and was transitioned to Augmentin.  Given worsening clinical symptoms as well as worsening leukocytosis, she was transition back to Zosyn and infectious diseases was consulted.  Decision made to discharge her on prolonged course of IV Zosyn with interval plan for surgery.  She presented for follow-up on 01/27/22 at which time the patient noted that she had worsened at home in the past day with fevers up to 101 and feeling achy and weak. She also noted some nausea and decreased appetite. Given increased WBC, worsening symptoms despite IV antibiotics, recommendation was made for admission and definitive surgical management. Plan had initially been for admission at [facility], however, her insurance was out of network. As a result she was sent to the emergency room for CT scan, continuation of antibiotics, admission, and ultimately surgical intervention.  01/30/22: Cystoscopy with right ureteral stent placement (Dr. Winter); exploratory laparotomy with en bloc resection including total hysterectomy, right salpingo-oophorectomy, appendectomy, and rectosigmoid resection, diverting loop ileostomy, and cystoscopy   She had   a prolonged course postop was complicated by development of a partial SBO. She required an NG tube for bowel rest and decompression on 2 separate occasions as well as TPN for 6 days.  She was ultimately discharged on postop day 22.  Interval History: Patient reports doing very well.  Continues to have good output from her ostomy, takes  Imodium once a day.  Is drinking and eating a lot.  Has been able to gain 15 pounds since her last visit with us in January.  Denies any urinary symptoms.  Denies any vaginal bleeding.  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   no meds 1997, treated in June 2016 x 1 month- states neg chest x ray June 2016    Past Surgical History:  Procedure Laterality Date   CYSTOSCOPY W/ URETERAL STENT PLACEMENT Right 01/30/2022   Procedure: CYSTOSCOPY WITH RETROGRADE PYELOGRAM/URETERAL STENT PLACEMENT;  Surgeon: Winter, Christopher Aaron, MD;  Location: [facility];  Service: Urology;  Laterality: Right;   LAPAROSCOPIC UNILATERAL SALPINGO OOPHERECTOMY N/A 09/29/2014   Procedure: LAPAROSCOPIC LEFT SALPINGO OOPHORECTOMY ADHESIOLYSIS ;  Surgeon: Emma Rossi, MD;  Location: [facility];  Service: Gynecology;  Laterality: N/A;   ROBOTIC ASSISTED LAPAROSCOPIC OVARIAN CYSTECTOMY N/A 09/29/2014   Procedure: SI ROBOTIC ASSISTED LAPAROSCOPIC OVARIAN CYSTECTOMY;  Surgeon: Emma Rossi, MD;  Location: [facility];  Service: Gynecology;  Laterality: N/A;   SALPINGOOPHORECTOMY N/A 01/30/2022   Procedure: OPEN unilateral SALPINGO OOPHORECTOMY,  hysterectomy  rectosigmoid resection and ileostomy;  Surgeon: Elliana Bal R, MD;  Location: [facility];  Service: Gynecology;  Laterality: N/A;   TYMPANOPLASTY Left 08/19/2019   Procedure: LEFT TYMPANOPLASTY;  Surgeon: Newman, Christopher E, MD;  Location: [facility];  Service: ENT;  Laterality: Left;    Family History  Problem Relation Age of Onset   Hyperlipidemia Mother    Hypertension Mother    Hyperlipidemia Father     Social History   Socioeconomic History    Marital status: Married    Spouse name: Not on file   Number of children: 0   Years of education: Not on file   Highest education level: Not on file  Occupational History   Occupation: financial consultant  Tobacco Use   Smoking status: Never   Smokeless tobacco: Never  Vaping Use   Vaping Use: Never used  Substance and Sexual Activity   Alcohol use: Not Currently    Alcohol/week: 0.0 standard drinks of alcohol   Drug use: No   Sexual activity: Not Currently    Partners: Male    Birth control/protection: None  Other Topics Concern   Not on file  Social History Narrative   Marital status:  Single; together x 8 years.      Children: none      Lives: with boyfriend; from [place]; moved to [place] at [place] 14.      Employment:  Financial consultant and nail technician.      Tobacco: none      Alcohol: weekends      Drugs: none      Exercise:  Sporadic      Seatbelt: 100%; no texting   Social Determinants   of Health   Financial Resource Strain: Not on file  Food Insecurity: No Food Insecurity (01/28/2022)   Hunger Vital Sign    Worried About Running Out of Food in the Last Year: Never true    Ran Out of Food in the Last Year: Never true  Transportation Needs: No Transportation Needs (01/28/2022)   PRAPARE - Transportation    Lack of Transportation (Medical): No    Lack of Transportation (Non-Medical): No  Physical Activity: Not on file  Stress: Not on file  Social Connections: Not on file    Current Medications:  Current Outpatient Medications:    ibuprofen (ADVIL) 800 MG tablet, Take 1 tablet (800 mg total) by mouth every 8 (eight) hours as needed for moderate pain. For AFTER surgery only, Disp: 30 tablet, Rfl: 0   amLODipine (NORVASC) 10 MG tablet, Take 1 tablet (10 mg total) by mouth daily., Disp: 30 tablet, Rfl: 3   benzonatate (TESSALON) 100 MG capsule, Take 1 capsule (100 mg total) by mouth 3 (three) times daily as needed for cough., Disp: 20 capsule, Rfl: 0    loperamide (IMODIUM) 2 MG capsule, Take 1 capsule (2 mg total) by mouth daily., Disp: 30 capsule, Rfl: 1   magnesium oxide (MAG-OX) 400 (240 Mg) MG tablet, Take 1 tablet (400 mg total) by mouth daily., Disp: 30 tablet, Rfl: 3   metFORMIN (GLUCOPHAGE) 500 MG tablet, Take 2 tablets (1,000 mg total) by mouth 2 (two) times daily with a meal., Disp: 60 tablet, Rfl: 3   potassium chloride (KLOR-CON M) 10 MEQ tablet, Take 1 tablet (10 mEq total) by mouth 2 (two) times daily., Disp: 30 tablet, Rfl: 0   rosuvastatin (CRESTOR) 40 MG tablet, Take 40 mg by mouth daily., Disp: , Rfl:    Selenium Sulfide 2.25 % SHAM, Apply 1 application  topically See admin instructions. Shampoo as directed, Disp: , Rfl:    simethicone (MYLICON) 80 MG chewable tablet, Chew 1 tablet (80 mg total) by mouth every 6 (six) hours as needed for flatulence (gas pains)., Disp: 30 tablet, Rfl: 3   traMADol (ULTRAM) 50 MG tablet, Take 1 tablet (50 mg total) by mouth every 6 (six) hours as needed for moderate pain or severe pain. For AFTER surgery, do not take and drive, Disp: 15 tablet, Rfl: 0  Review of Systems: Denies appetite changes, fevers, chills, fatigue, unexplained weight changes. Denies hearing loss, neck lumps or masses, mouth sores, ringing in ears or voice changes. Denies cough or wheezing.  Denies shortness of breath. Denies chest pain or palpitations. Denies leg swelling. Denies abdominal distention, pain, blood in stools, constipation, diarrhea, nausea, vomiting, or early satiety. Denies pain with intercourse, dysuria, frequency, hematuria or incontinence. Denies hot flashes, pelvic pain, vaginal bleeding or vaginal discharge.   Denies joint pain, back pain or muscle pain/cramps. Denies itching, rash, or wounds. Denies dizziness, headaches, numbness or seizures. Denies swollen lymph nodes or glands, denies easy bruising or bleeding. Denies anxiety, depression, confusion, or decreased concentration.  Physical Exam: BP  119/79 (BP Location: Right Arm, Patient Position: Sitting)   Pulse 80   Temp 97.6 F (36.4 C) (Oral)   Wt 120 lb 12.8 oz (54.8 kg)   SpO2 100%   BMI 22.23 kg/m  General: Alert, oriented, no acute distress. HEENT: Normocephalic, atraumatic, sclera anicteric. Chest: Clear to auscultation bilaterally.  No wheezes or rhonchi. Cardiovascular: Regular rate and rhythm, no murmurs. Abdomen: soft, nontender.  Normoactive bowel sounds.  No masses or hepatosplenomegaly appreciated.  Well-healed   incisions.  Ostomy in place, pink, viable.  Laboratory & Radiologic Studies: CT A/P on 5/1: Status post left hemicolectomy with diverting right mid abdominal ileostomy.   Status post rectal contrast administration, without extraluminal contrast visualized.  Assessment & Plan: Marie Hill is a 41 y.o. woman with history of endometriosis who developed TOA after outpatient REI procedure (transvaginal endometrioma drainage) that failed outpatient therapy requiring extensive surgery in 01/2022 including rectosigmoid resection with diverting ileostomy now presenting for reversal.  Patient is doing very well.  We discussed plan for reversal surgery later this month.  She has recently switched insurance and was told when she came for CT scan at [facility] is now out of network.  I will have my team look into this.  Given her very complex course and that this is the last portion of her care, I think that there are many reasons supporting the patient having surgery here at this hospital with me.  We discussed need for inpatient stay after.  I suspect that she will require admission for 2-5 days.  We reviewed the plan for a Ileostomy reversal, possible laparotomy. The risks of surgery were discussed in detail and she understands these to include infection; wound separation; hernia; injury to adjacent organs; bleeding which may require blood transfusion; anesthesia risk; thromboembolic events; possible death; unforeseen  complications; possible need for re-exploration; medical complications such as heart attack, stroke, pleural effusion and pneumonia. The patient will receive DVT and antibiotic prophylaxis as indicated. She voiced a clear understanding. She had the opportunity to ask questions. Perioperative instructions were reviewed with her. Prescriptions for post-op medications were sent to her pharmacy of choice.   28 minutes of total time was spent for this patient encounter, including preparation, face-to-face counseling with the patient and coordination of care, and documentation of the encounter.  Odessie Polzin, MD  Division of Gynecologic Oncology  Department of Obstetrics and Gynecology  [facility]   

## 2022-06-23 NOTE — Progress Notes (Signed)
Patient here for follow up with Dr. Pricilla Holm and for a pre-operative appointment prior to her scheduled surgery on Jul 12, 2022. She is scheduled for ileostomy reversal. The surgery was discussed in detail.  See after visit summary for additional details.    Discussed post-op pain management in detail including the aspects of the enhanced recovery pathway.  Advised her that a new prescription would be sent in for tramadol and it is only to be used for after her upcoming surgery.  We discussed the use of tylenol post-op and to monitor for a maximum of 4,000 mg in a 24 hour period.    Discussed the use of SCDs and measures to take at home to prevent DVT including frequent mobility.  Reportable signs and symptoms of DVT discussed. Post-operative instructions discussed and expectations for after surgery. Incisional care discussed as well including reportable signs and symptoms including erythema, drainage, wound separation.     10 minutes spent with the patient.  Verbalizing understanding of material discussed. No needs or concerns voiced at the end of the visit.   Advised patient to call for any needs.  Advised that her post-operative medications had been prescribed and could be picked up at any time.    This appointment is included in the global surgical bundle as pre-operative teaching and has no charge.

## 2022-07-03 NOTE — Progress Notes (Signed)
Anesthesia Review:  PCP: Cardiologist : Chest x-ray : EKG : 12/26/21  Echo : Stress test: Cardiac Cath :  Activity level:  Sleep Study/ CPAP : Fasting Blood Sugar :      / Checks Blood Sugar -- times a day:   Blood Thinner/ Instructions /Last Dose: ASA / Instructions/ Last Dose :    DM- type  Hgba1c-

## 2022-07-03 NOTE — Patient Instructions (Signed)
SURGICAL WAITING ROOM VISITATION  Patients having surgery or a procedure may have no more than 2 support people in the waiting area - these visitors may rotate.    Children under the age of 102 must have an adult with them who is not the patient.  Due to an increase in RSV and influenza rates and associated hospitalizations, children ages 60 and under may not visit patients in [facility] hospitals.  If the patient needs to stay at the hospital during part of their recovery, the visitor guidelines for inpatient rooms apply. Pre-op nurse will coordinate an appropriate time for 1 support person to accompany patient in pre-op.  This support person may not rotate.    Please refer to the [facility] website for the visitor guidelines for Inpatients (after your surgery is over and you are in a regular room).       Your procedure is scheduled on:  07/12/22    Report to [facility] Main Entrance    Report to admitting at  1230 pm    Call this number if you have problems the morning of surgery [phone]   Do not eat food :After Midnight.   After Midnight you may have the following liquids until _ 1130_____ AM  DAY OF SURGERY  Water Non-Citrus Juices (without pulp, NO RED-Apple, White grape, White cranberry) Black Coffee (NO MILK/CREAM OR CREAMERS, sugar ok)  Clear Tea (NO MILK/CREAM OR CREAMERS, sugar ok) regular and decaf                             Plain Jell-O (NO RED)                                           Fruit ices (not with fruit pulp, NO RED)                                     Popsicles (NO RED)                                                               Sports drinks like Gatorade (NO RED)                    The day of surgery:  Drink ONE (1) Pre-Surgery Clear Ensure or G2 at   1130AM ( have completed by )  the morning of surgery. Drink in one sitting. Do not sip.  This drink was given to you during your hospital  pre-op appointment visit. Nothing else to  drink after completing the  Pre-Surgery Clear Ensure or G2.          If you have questions, please contact your surgeon's office.   F     Oral Hygiene is also important to reduce your risk of infection.                                    Remember - BRUSH YOUR TEETH THE MORNING  OF SURGERY WITH YOUR REGULAR TOOTHPASTE  DENTURES WILL BE REMOVED PRIOR TO SURGERY PLEASE DO NOT APPLY "Poly grip" OR ADHESIVES!!!   Do NOT smoke after Midnight   Take these medicines the morning of surgery with A SIP OF WATER:  amlodipine   DO NOT TAKE ANY ORAL DIABETIC MEDICATIONS DAY OF YOUR SURGERY  Bring CPAP mask and tubing day of surgery.                              You may not have any metal on your body including hair pins, jewelry, and body piercing             Do not wear make-up, lotions, powders, perfumes/cologne, or deodorant  Do not wear nail polish including gel and S&S, artificial/acrylic nails, or any other type of covering on natural nails including finger and toenails. If you have artificial nails, gel coating, etc. that needs to be removed by a nail salon please have this removed prior to surgery or surgery may need to be canceled/ delayed if the surgeon/ anesthesia feels like they are unable to be safely monitored.   Do not shave  48 hours prior to surgery.               Men may shave face and neck.   Do not bring valuables to the hospital. [facility] IS NOT             RESPONSIBLE   FOR VALUABLES.   Contacts, glasses, dentures or bridgework may not be worn into surgery.   Bring small overnight bag day of surgery.   DO NOT BRING YOUR HOME MEDICATIONS TO THE HOSPITAL. PHARMACY WILL DISPENSE MEDICATIONS LISTED ON YOUR MEDICATION LIST TO YOU DURING YOUR ADMISSION IN THE HOSPITAL!    Patients discharged on the day of surgery will not be allowed to drive home.  Someone NEEDS to stay with you for the first 24 hours after anesthesia.   Special Instructions: Bring a copy of your  healthcare power of attorney and living will documents the day of surgery if you haven't scanned them before.              Please read over the following fact sheets you were given: IF YOU HAVE QUESTIONS ABOUT YOUR PRE-OP INSTRUCTIONS PLEASE CALL [phone]   If you received a COVID test during your pre-op visit  it is requested that you wear a mask when out in public, stay away from anyone that may not be feeling well and notify your surgeon if you develop symptoms. If you test positive for Covid or have been in contact with anyone that has tested positive in the last 10 days please notify you surgeon.     - Preparing for Surgery Before surgery, you can play an important role.  Because skin is not sterile, your skin needs to be as free of germs as possible.  You can reduce the number of germs on your skin by washing with CHG (chlorahexidine gluconate) soap before surgery.  CHG is an antiseptic cleaner which kills germs and bonds with the skin to continue killing germs even after washing. Please DO NOT use if you have an allergy to CHG or antibacterial soaps.  If your skin becomes reddened/irritated stop using the CHG and inform your nurse when you arrive at Short Stay. Do not shave (including legs and underarms) for at least 48 hours prior to the  first CHG shower.  You may shave your face/neck. Please follow these instructions carefully:  1.  Shower with CHG Soap the night before surgery and the  morning of Surgery.  2.  If you choose to wash your hair, wash your hair first as usual with your  normal  shampoo.  3.  After you shampoo, rinse your hair and body thoroughly to remove the  shampoo.                           4.  Use CHG as you would any other liquid soap.  You can apply chg directly  to the skin and wash                       Gently with a scrungie or clean washcloth.  5.  Apply the CHG Soap to your body ONLY FROM THE NECK DOWN.   Do not use on face/ open                            Wound or open sores. Avoid contact with eyes, ears mouth and genitals (private parts).                       Wash face,  Genitals (private parts) with your normal soap.             6.  Wash thoroughly, paying special attention to the area where your surgery  will be performed.  7.  Thoroughly rinse your body with warm water from the neck down.  8.  DO NOT shower/wash with your normal soap after using and rinsing off  the CHG Soap.                9.  Pat yourself dry with a clean towel.            10.  Wear clean pajamas.            11.  Place clean sheets on your bed the night of your first shower and do not  sleep with pets. Day of Surgery : Do not apply any lotions/deodorants the morning of surgery.  Please wear clean clothes to the hospital/surgery center.  FAILURE TO FOLLOW THESE INSTRUCTIONS MAY RESULT IN THE CANCELLATION OF YOUR SURGERY PATIENT SIGNATURE_________________________________  NURSE SIGNATURE__________________________________  ________________________________________________________________________

## 2022-07-04 ENCOUNTER — Telehealth: Payer: Self-pay

## 2022-07-04 ENCOUNTER — Other Ambulatory Visit: Payer: Self-pay | Admitting: Gynecologic Oncology

## 2022-07-04 ENCOUNTER — Encounter (HOSPITAL_COMMUNITY): Payer: Self-pay

## 2022-07-04 ENCOUNTER — Other Ambulatory Visit: Payer: Self-pay

## 2022-07-04 ENCOUNTER — Encounter (HOSPITAL_COMMUNITY)
Admission: RE | Admit: 2022-07-04 | Discharge: 2022-07-04 | Disposition: A | Discharge status: Home / Self Care | Payer: PRIVATE HEALTH INSURANCE | Source: Ambulatory Visit | Attending: Gynecologic Oncology | Admitting: Gynecologic Oncology

## 2022-07-04 VITALS — BP 111/77 | HR 75 | Temp 98.1°F | Resp 16 | Ht 62.0 in | Wt 121.0 lb

## 2022-07-04 DIAGNOSIS — Z01818 Encounter for other preprocedural examination: Secondary | ICD-10-CM | POA: Diagnosis present

## 2022-07-04 DIAGNOSIS — Z932 Ileostomy status: Secondary | ICD-10-CM | POA: Diagnosis not present

## 2022-07-04 DIAGNOSIS — R7989 Other specified abnormal findings of blood chemistry: Secondary | ICD-10-CM

## 2022-07-04 HISTORY — DX: Peripheral vascular disease, unspecified: I73.9

## 2022-07-04 HISTORY — DX: Essential (primary) hypertension: I10

## 2022-07-04 LAB — COMPREHENSIVE METABOLIC PANEL
ALT: 101 U/L — ABNORMAL HIGH (ref 0–44)
AST: 56 U/L — ABNORMAL HIGH (ref 15–41)
Albumin: 4.2 g/dL (ref 3.5–5.0)
Alkaline Phosphatase: 137 U/L — ABNORMAL HIGH (ref 38–126)
Anion gap: 9 (ref 5–15)
BUN: 22 mg/dL — ABNORMAL HIGH (ref 6–20)
CO2: 22 mmol/L (ref 22–32)
Calcium: 10.1 mg/dL (ref 8.9–10.3)
Chloride: 107 mmol/L (ref 98–111)
Creatinine, Ser: 0.59 mg/dL (ref 0.44–1.00)
GFR, Estimated: >60 mL/min (ref >60)
Glucose, Bld: 135 mg/dL — ABNORMAL HIGH (ref 70–99)
Potassium: 3.8 mmol/L (ref 3.5–5.1)
Sodium: 138 mmol/L (ref 135–145)
Total Bilirubin: 0.7 mg/dL (ref 0.3–1.2)
Total Protein: 8 g/dL (ref 6.5–8.1)

## 2022-07-04 LAB — CBC
HCT: 40.7 % (ref 36.0–46.0)
Hemoglobin: 12.5 g/dL (ref 12.0–15.0)
MCH: 26 pg (ref 26.0–34.0)
MCHC: 30.7 g/dL (ref 30.0–36.0)
MCV: 84.8 fL (ref 80.0–100.0)
Platelets: 298 10*3/uL (ref 150–400)
RBC: 4.8 MIL/uL (ref 3.87–5.11)
RDW: 13.8 % (ref 11.5–15.5)
WBC: 6.5 10*3/uL (ref 4.0–10.5)
nRBC: 0 % (ref 0.0–0.2)

## 2022-07-04 LAB — GLUCOSE, CAPILLARY: Glucose-Capillary: 124 mg/dL — ABNORMAL HIGH (ref 70–99)

## 2022-07-04 LAB — HEMOGLOBIN A1C
Hgb A1c MFr Bld: 6.6 % — ABNORMAL HIGH (ref 4.8–5.6)
Mean Plasma Glucose: 142.72 mg/dL

## 2022-07-04 LAB — TYPE AND SCREEN

## 2022-07-04 NOTE — Telephone Encounter (Signed)
Called pt in regards to message below.  Reached out to pts PCP and no answer.  LVM for return call.  Called pt to inform her of elevated liver function labs and lab redraw.  Pt denies alcohol use, any new medications, frequent use of tylenol, hx of hepatitis, pain in abdomen, and body itching.  Pt stated that her only new symptom is her body feels hot all of a sudden kind of like a hot flash.   Made pt a lab appt for 5/22 @ 8am for a redraw.  Orders are in per Efraim Kaufmann, NP.  Informed pt that the labs will be drew at the [facility]. Someone from our office will be in contact per new labs. Pt verbalized understanding.

## 2022-07-04 NOTE — Progress Notes (Signed)
Given elevated liver function studies, plan for coags along with acute hepatitis panel. Recent CT on 06/14/2022: Liver is within normal limits. Gallbladder is unremarkable. No intrahepatic or extrahepatic ductal dilatation.

## 2022-07-04 NOTE — Telephone Encounter (Signed)
-----   Message from Doylene Bode, NP sent at 07/04/2022  1:15 PM EDT ----- Her liver function labs are elevated on preop labs. Please reach out to PCP to see if she has had any labs with their office in between 02/2022 to now including thyroid labs   Please call the patient and let her know. Please ask if she has had a hx of this before?, any alcohol use?, any new meds or freq tylenol use?, ever had a history of hepatitis?, any pain the abdomen?, any other symptoms like body itching etc?  You can let her know we have reached out to Dr. Pricilla Holm and will let her know of any recommendations moving forward.

## 2022-07-05 ENCOUNTER — Inpatient Hospital Stay: Payer: PRIVATE HEALTH INSURANCE

## 2022-07-05 DIAGNOSIS — R7989 Other specified abnormal findings of blood chemistry: Secondary | ICD-10-CM

## 2022-07-05 DIAGNOSIS — Z8742 Personal history of other diseases of the female genital tract: Secondary | ICD-10-CM | POA: Diagnosis not present

## 2022-07-05 LAB — HEPATITIS PANEL, ACUTE
HCV Ab: NONREACTIVE
Hep A IgM: NONREACTIVE
Hep B C IgM: NONREACTIVE
Hepatitis B Surface Ag: NONREACTIVE

## 2022-07-05 LAB — APTT: aPTT: 32 seconds (ref 24–36)

## 2022-07-05 LAB — PROTIME-INR
INR: 0.9 (ref 0.8–1.2)
Prothrombin Time: 12.2 seconds (ref 11.4–15.2)

## 2022-07-06 ENCOUNTER — Telehealth: Payer: Self-pay | Admitting: *Deleted

## 2022-07-06 NOTE — Telephone Encounter (Signed)
-----   Message from Doylene Bode, NP sent at 07/05/2022  3:54 PM EDT ----- Please let the patient know her hepatitis testing was negative. Clotting factors are normal. We will plan on rechecking her liver enzymes on the day of surgery. ----- Message ----- From: Leory Plowman, Lab In [place] Sent: 07/05/2022   8:52 AM EDT To: Doylene Bode, NP

## 2022-07-06 NOTE — Telephone Encounter (Signed)
Spoke with Marie Hill and relayed message from Warner Mccreedy, NP, Patients Hepatitis labs were negative and clotting factors were normal. Advised patient that her liver enzyme labs would be rechecked the day of surgery. Pt Thanked the office and has no questions or concerns at this time.

## 2022-07-07 ENCOUNTER — Other Ambulatory Visit: Payer: Self-pay | Admitting: Gynecologic Oncology

## 2022-07-07 ENCOUNTER — Encounter: Payer: Self-pay | Admitting: Gynecologic Oncology

## 2022-07-07 ENCOUNTER — Telehealth: Payer: Self-pay | Admitting: *Deleted

## 2022-07-07 DIAGNOSIS — R7989 Other specified abnormal findings of blood chemistry: Secondary | ICD-10-CM

## 2022-07-07 NOTE — Telephone Encounter (Signed)
Spoke to Marie Hill and relayed message from Warner Mccreedy, NP. Pt is to have an Ultrasound prior to her surgery on Wednesday, May 29th. Ultrasound is scheduled for Saturday, May 25 for 10:00 at [facility]. Pt instructed nothing to eat or drink after midnight tonight and arrive through the ED by 0930. Pt agreed to date and time and receptive to instructions.

## 2022-07-07 NOTE — Telephone Encounter (Signed)
-----   Message from Doylene Bode, NP sent at 07/06/2022 12:14 PM EDT ----- Thanks! Dr. Pricilla Holm wants her to get follow up with PCP sooner rather than later  ----- Message ----- From: Shellee Milo, RN Sent: 07/06/2022  11:39 AM EDT To: Doylene Bode, NP  Done :) ----- Message ----- From: Doylene Bode, NP Sent: 07/05/2022   3:55 PM EDT To: Shellee Milo, RN; Kellie Moor, LPN  Please let the patient know her hepatitis testing was negative. Clotting factors are normal. We will plan on rechecking her liver enzymes on the day of surgery. ----- Message ----- From: Leory Plowman, Lab In [place] Sent: 07/05/2022   8:52 AM EDT To: Doylene Bode, NP

## 2022-07-07 NOTE — Progress Notes (Signed)
Per Dr. Rito Ehrlich with Radiology, the patient's elevated liver enzymes is most likely related to hepatic steatosis. On recent CT, there was no mass seen, no blocked ducts. On CT in Dec 2023, the liver looked slightly abnormal, with hepatic steatosis being intermittent for some. Plan for [place] RUQ abdomen for further evaluate.

## 2022-07-07 NOTE — Telephone Encounter (Signed)
Marie Hill was called to relay message from Warner Mccreedy, NP, Patient states her appointment is June 8 th with her PCP Dr. Corky Downs.  Pt states that Dr. Pricilla Holm told her she would possibly be in the hospital a couple days after surgery, so she didn't want to make it to soon after discharge.  Pt has no further questions or concerns at this time.

## 2022-07-07 NOTE — Telephone Encounter (Signed)
-----   Message from Doylene Bode, NP sent at 07/07/2022  9:55 AM EDT ----- This patient needs to have an ultrasound of the RUQ abdomen before surgery if able. Her surgery is next week. She has elevated liver enzymes.

## 2022-07-08 ENCOUNTER — Ambulatory Visit (HOSPITAL_COMMUNITY)
Admission: RE | Admit: 2022-07-08 | Discharge: 2022-07-08 | Disposition: A | Discharge status: Home / Self Care | Payer: PRIVATE HEALTH INSURANCE | Source: Ambulatory Visit | Attending: Gynecologic Oncology | Admitting: Gynecologic Oncology

## 2022-07-08 DIAGNOSIS — R7989 Other specified abnormal findings of blood chemistry: Secondary | ICD-10-CM | POA: Diagnosis present

## 2022-07-11 ENCOUNTER — Telehealth: Payer: Self-pay | Admitting: *Deleted

## 2022-07-11 ENCOUNTER — Encounter: Payer: Self-pay | Admitting: Gynecologic Oncology

## 2022-07-11 NOTE — Progress Notes (Signed)
Short stay notified of need for stat Cmet when patient gets checked in tomorrow before her surgery to follow up on elevated liver enzymes.

## 2022-07-11 NOTE — Progress Notes (Signed)
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 Denese Killings.  Elevated liver enzymes at PAT visit.  Coags and hepatitis panel ordered by Dr. Pricilla Holm, hepatitis panel negative, clotting factors normal.  RUQ [place] ordered which shows probable 3 mm cholesterol containing polyp in the gallbladder, nonspecific increase echogenicity throughout the liver may be hepatic steatosis.  Liver enzymes to be rechecked DOS, ordered by Melissa cross, NP.  Patient has PCP follow-up July 22, 2022  VS: BP 111/77   Pulse 75   Temp 36.7 C (Oral)   Resp 16   Ht 5\' 2"  (1.575 m)   Wt 54.9 kg   SpO2 100%   BMI 22.13 kg/m   PROVIDERS: Harvest [place], MD is PCP   LABS: Labs reviewed: Acceptable for surgery. (all labs ordered are listed, but only abnormal results are displayed)  Labs Reviewed  COMPREHENSIVE METABOLIC PANEL - Abnormal; Notable for the following components:      Result Value   Glucose, Bld 135 (*)    BUN 22 (*)    AST 56 (*)    ALT 101 (*)    Alkaline Phosphatase 137 (*)    All other components within normal limits  HEMOGLOBIN A1C - Abnormal; Notable for the following components:   Hgb A1c MFr Bld 6.6 (*)    All other components within normal limits  GLUCOSE, CAPILLARY - Abnormal; Notable for the following components:   Glucose-Capillary 124 (*)    All other components within normal limits  CBC  TYPE AND SCREEN     IMAGES:   EKG:   CV:  Past Medical History:  Diagnosis Date   Dandruff    Diabetes mellitus without complication (HCC)    Endometriosis 2016   Stage IV/LSO   Glucose intolerance (impaired glucose tolerance)    Hyperlipidemia     Hypertension    Peripheral vascular disease (HCC)    Positive PPD, treated    states follow up chest x rays negative   PPD positive 1997,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   no meds 1997, treated in June 2016 x 1 month- states neg chest x ray June 2016    Past Surgical History:  Procedure Laterality Date   CYSTOSCOPY W/ URETERAL STENT PLACEMENT Right 01/30/2022   Procedure: CYSTOSCOPY WITH RETROGRADE PYELOGRAM/URETERAL STENT PLACEMENT;  Surgeon: Rene Paci, MD;  Location: [facility];  Service: Urology;  Laterality: Right;   LAPAROSCOPIC UNILATERAL SALPINGO OOPHERECTOMY N/A 09/29/2014   Procedure: LAPAROSCOPIC LEFT SALPINGO OOPHORECTOMY ADHESIOLYSIS ;  Surgeon: Adolphus Birchwood, MD;  Location: [facility];  Service: Gynecology;  Laterality: N/A;   ROBOTIC ASSISTED LAPAROSCOPIC OVARIAN CYSTECTOMY N/A 09/29/2014   Procedure: SI ROBOTIC ASSISTED LAPAROSCOPIC OVARIAN CYSTECTOMY;  Surgeon: Adolphus Birchwood, MD;  Location: [facility];  Service: Gynecology;  Laterality: N/A;   SALPINGOOPHORECTOMY N/A 01/30/2022   Procedure: OPEN unilateral SALPINGO OOPHORECTOMY,  hysterectomy  rectosigmoid resection and ileostomy;  Surgeon: Carver Fila, MD;  Location: [facility];  Service: Gynecology;  Laterality: N/A;   TYMPANOPLASTY Left 08/19/2019   Procedure: LEFT TYMPANOPLASTY;  Surgeon: Drema Halon, MD;  Location: [facility];  Service: ENT;  Laterality: Left;  MEDICATIONS:  amLODipine (NORVASC) 10 MG tablet   benzonatate (TESSALON) 100 MG capsule   clobetasol (TEMOVATE) 0.05 % external solution   ibuprofen (ADVIL) 800 MG tablet   JARDIANCE 25 MG TABS tablet   loperamide (IMODIUM) 2 MG capsule   magnesium oxide (MAG-OX) 400 (240 Mg) MG tablet   metFORMIN (GLUCOPHAGE) 500 MG tablet   potassium chloride (KLOR-CON M) 10 MEQ tablet   rosuvastatin (CRESTOR) 40 MG tablet   simethicone (MYLICON) 80 MG chewable tablet   traMADol (ULTRAM) 50 MG tablet   No current facility-administered medications for this  encounter.    Jodell Cipro Ward, PA-C [facility] [phone]

## 2022-07-11 NOTE — Anesthesia Preprocedure Evaluation (Signed)
Anesthesia Evaluation  Patient identified by MRN, date of birth, ID band Patient awake    Reviewed: Allergy & Precautions, NPO status , Patient's Chart, lab work & pertinent test results  History of Anesthesia Complications Negative for: history of anesthetic complications  Airway Mallampati: II  TM Distance: >3 FB Neck ROM: Full    Dental no notable dental hx. (+) Dental Advisory Given   Pulmonary neg pulmonary ROS   Pulmonary exam normal        Cardiovascular hypertension, Pt. on medications Normal cardiovascular exam     Neuro/Psych negative neurological ROS     GI/Hepatic negative GI ROS, Neg liver ROS,,,  Endo/Other  diabetes    Renal/GU negative Renal ROS     Musculoskeletal negative musculoskeletal ROS (+)    Abdominal   Peds  Hematology negative hematology ROS (+)   Anesthesia Other Findings   Reproductive/Obstetrics                             Anesthesia Physical Anesthesia Plan  ASA: 2  Anesthesia Plan: General   Post-op Pain Management: Tylenol PO (pre-op)* and Toradol IV (intra-op)*   Induction:   PONV Risk Score and Plan: 4 or greater and Dexamethasone, Ondansetron, Midazolam and Scopolamine patch - Pre-op  Airway Management Planned: Oral ETT  Additional Equipment: None  Intra-op Plan:   Post-operative Plan: Extubation in OR  Informed Consent: I have reviewed the patients History and Physical, chart, labs and discussed the procedure including the risks, benefits and alternatives for the proposed anesthesia with the patient or authorized representative who has indicated his/her understanding and acceptance.     Dental advisory given  Plan Discussed with: Anesthesiologist and CRNA  Anesthesia Plan Comments:        Anesthesia Quick Evaluation

## 2022-07-11 NOTE — Telephone Encounter (Signed)
-----   Message from Doylene Bode, NP sent at 07/11/2022 12:17 PM EDT ----- Please let the patient know her abdominal ultrasound is showing what could be fatty liver where there is an increased buildup of fat in the liver. A risk factor for development of this can be diabetes. We are going to recheck her labs in the morning when she gets checked in.   She will need to be followed by her PCP for this. Please fax results to their office. Proceed as planned for tomorrow.

## 2022-07-11 NOTE — Telephone Encounter (Signed)
Telephone call to check on pre-operative status.  Patient compliant with pre-operative instructions.  Reinforced nothing to eat after midnight. Clear liquids until 0415. Patient to arrive at 0515.  No questions or concerns voiced.  Instructed to call for any needs. 

## 2022-07-11 NOTE — Telephone Encounter (Signed)
Spoke with Ms. Spofford and relayed message from Warner Mccreedy, NP in regards to abdominal ultrasound results and that these results showing possible fatty liver will not interfere with her surgery and that it's still scheduled for tomorrow. Instructed patient that her labs would be rechecked in the morning.   Results of ultrasound faxed to patient's PCP, Dr. Fatima Sanger B. Bakare. Pt has no further concerns or questions.

## 2022-07-12 ENCOUNTER — Inpatient Hospital Stay (HOSPITAL_COMMUNITY): Payer: PRIVATE HEALTH INSURANCE | Admitting: Physician Assistant

## 2022-07-12 ENCOUNTER — Other Ambulatory Visit: Payer: Self-pay

## 2022-07-12 ENCOUNTER — Inpatient Hospital Stay (HOSPITAL_COMMUNITY): Payer: PRIVATE HEALTH INSURANCE

## 2022-07-12 ENCOUNTER — Encounter (HOSPITAL_COMMUNITY)
Admission: RE | Disposition: A | Discharge status: Home / Self Care | Payer: Self-pay | Source: Ambulatory Visit | Attending: Gynecologic Oncology

## 2022-07-12 ENCOUNTER — Encounter (HOSPITAL_COMMUNITY): Payer: Self-pay | Admitting: Gynecologic Oncology

## 2022-07-12 ENCOUNTER — Inpatient Hospital Stay (HOSPITAL_COMMUNITY)
Admission: RE | Admit: 2022-07-12 | Discharge: 2022-07-17 | DRG: 330 | Disposition: A | Discharge status: Home / Self Care | Payer: PRIVATE HEALTH INSURANCE | Source: Ambulatory Visit | Attending: Gynecologic Oncology | Admitting: Gynecologic Oncology

## 2022-07-12 DIAGNOSIS — E119 Type 2 diabetes mellitus without complications: Secondary | ICD-10-CM

## 2022-07-12 DIAGNOSIS — K5651 Intestinal adhesions [bands], with partial obstruction: Secondary | ICD-10-CM | POA: Diagnosis present

## 2022-07-12 DIAGNOSIS — D62 Acute posthemorrhagic anemia: Secondary | ICD-10-CM | POA: Diagnosis not present

## 2022-07-12 DIAGNOSIS — Z01818 Encounter for other preprocedural examination: Secondary | ICD-10-CM

## 2022-07-12 DIAGNOSIS — Z432 Encounter for attention to ileostomy: Principal | ICD-10-CM

## 2022-07-12 DIAGNOSIS — Z90710 Acquired absence of both cervix and uterus: Secondary | ICD-10-CM

## 2022-07-12 DIAGNOSIS — I10 Essential (primary) hypertension: Secondary | ICD-10-CM

## 2022-07-12 DIAGNOSIS — R000 Tachycardia, unspecified: Secondary | ICD-10-CM | POA: Diagnosis present

## 2022-07-12 DIAGNOSIS — Z932 Ileostomy status: Secondary | ICD-10-CM

## 2022-07-12 DIAGNOSIS — D72829 Elevated white blood cell count, unspecified: Secondary | ICD-10-CM | POA: Diagnosis present

## 2022-07-12 DIAGNOSIS — N83201 Unspecified ovarian cyst, right side: Secondary | ICD-10-CM | POA: Diagnosis present

## 2022-07-12 DIAGNOSIS — E876 Hypokalemia: Secondary | ICD-10-CM | POA: Diagnosis present

## 2022-07-12 DIAGNOSIS — Z83438 Family history of other disorder of lipoprotein metabolism and other lipidemia: Secondary | ICD-10-CM | POA: Diagnosis not present

## 2022-07-12 DIAGNOSIS — E785 Hyperlipidemia, unspecified: Secondary | ICD-10-CM | POA: Diagnosis present

## 2022-07-12 DIAGNOSIS — Z7984 Long term (current) use of oral hypoglycemic drugs: Secondary | ICD-10-CM

## 2022-07-12 DIAGNOSIS — Z90721 Acquired absence of ovaries, unilateral: Secondary | ICD-10-CM

## 2022-07-12 DIAGNOSIS — Z8249 Family history of ischemic heart disease and other diseases of the circulatory system: Secondary | ICD-10-CM

## 2022-07-12 HISTORY — PX: ILEOSTOMY CLOSURE: SHX1784

## 2022-07-12 LAB — GLUCOSE, CAPILLARY
Glucose-Capillary: 146 mg/dL — ABNORMAL HIGH (ref 70–99)
Glucose-Capillary: 148 mg/dL — ABNORMAL HIGH (ref 70–99)
Glucose-Capillary: 157 mg/dL — ABNORMAL HIGH (ref 70–99)
Glucose-Capillary: 188 mg/dL — ABNORMAL HIGH (ref 70–99)
Glucose-Capillary: 196 mg/dL — ABNORMAL HIGH (ref 70–99)
Glucose-Capillary: 223 mg/dL — ABNORMAL HIGH (ref 70–99)
Glucose-Capillary: 224 mg/dL — ABNORMAL HIGH (ref 70–99)

## 2022-07-12 LAB — COMPREHENSIVE METABOLIC PANEL
ALT: 144 U/L — ABNORMAL HIGH (ref 0–44)
AST: 77 U/L — ABNORMAL HIGH (ref 15–41)
Albumin: 4.4 g/dL (ref 3.5–5.0)
Alkaline Phosphatase: 142 U/L — ABNORMAL HIGH (ref 38–126)
Anion gap: 9 (ref 5–15)
BUN: 15 mg/dL (ref 6–20)
CO2: 27 mmol/L (ref 22–32)
Calcium: 10 mg/dL (ref 8.9–10.3)
Chloride: 102 mmol/L (ref 98–111)
Creatinine, Ser: 0.52 mg/dL (ref 0.44–1.00)
GFR, Estimated: >60 mL/min (ref >60)
Glucose, Bld: 154 mg/dL — ABNORMAL HIGH (ref 70–99)
Potassium: 3.6 mmol/L (ref 3.5–5.1)
Sodium: 138 mmol/L (ref 135–145)
Total Bilirubin: 0.7 mg/dL (ref 0.3–1.2)
Total Protein: 8.3 g/dL — ABNORMAL HIGH (ref 6.5–8.1)

## 2022-07-12 LAB — TYPE AND SCREEN
ABO/RH(D): B POS
Antibody Screen: NEGATIVE

## 2022-07-12 LAB — POCT PREGNANCY, URINE: Preg Test, Ur: NEGATIVE

## 2022-07-12 SURGERY — CLOSURE, ILEOSTOMY
Anesthesia: General

## 2022-07-12 MED ORDER — ACETAMINOPHEN 500 MG PO TABS
1000.0000 mg | ORAL_TABLET | ORAL | Status: AC
  Administered 2022-07-12: 1000 mg via ORAL
  Filled 2022-07-12: qty 2

## 2022-07-12 MED ORDER — BUPIVACAINE LIPOSOME 1.3 % IJ SUSP
INTRAMUSCULAR | Status: DC | PRN
  Administered 2022-07-12: 20 mL

## 2022-07-12 MED ORDER — OXYCODONE HCL 5 MG PO TABS
5.0000 mg | ORAL_TABLET | ORAL | Status: DC | PRN
  Administered 2022-07-12 – 2022-07-15 (×4): 5 mg via ORAL
  Filled 2022-07-12 (×5): qty 1

## 2022-07-12 MED ORDER — PHENYLEPHRINE HCL (PRESSORS) 10 MG/ML IV SOLN
INTRAVENOUS | Status: AC
  Filled 2022-07-12: qty 1

## 2022-07-12 MED ORDER — GABAPENTIN 300 MG PO CAPS: 300.0000 mg | ORAL_CAPSULE | ORAL | Status: DC

## 2022-07-12 MED ORDER — BUPIVACAINE LIPOSOME 1.3 % IJ SUSP
INTRAMUSCULAR | Status: AC
  Filled 2022-07-12: qty 20

## 2022-07-12 MED ORDER — PROPOFOL 10 MG/ML IV BOLUS
INTRAVENOUS | Status: AC
  Filled 2022-07-12: qty 20

## 2022-07-12 MED ORDER — CHEWING GUM (ORBIT) SUGAR FREE
1.0000 | CHEWING_GUM | Freq: Three times a day (TID) | ORAL | Status: DC
  Administered 2022-07-12 – 2022-07-17 (×15): 1 via ORAL
  Filled 2022-07-12: qty 1

## 2022-07-12 MED ORDER — LIDOCAINE 2% (20 MG/ML) 5 ML SYRINGE
INTRAMUSCULAR | Status: DC | PRN
  Administered 2022-07-12: 1.5 mg/kg/h via INTRAVENOUS
  Administered 2022-07-12: 40 mg via INTRAVENOUS

## 2022-07-12 MED ORDER — ONDANSETRON HCL 4 MG/2ML IJ SOLN
INTRAMUSCULAR | Status: AC
  Filled 2022-07-12: qty 2

## 2022-07-12 MED ORDER — KCL IN DEXTROSE-NACL 20-5-0.45 MEQ/L-%-% IV SOLN
INTRAVENOUS | Status: DC
  Filled 2022-07-12 (×3): qty 1000

## 2022-07-12 MED ORDER — AMLODIPINE BESYLATE 10 MG PO TABS
10.0000 mg | ORAL_TABLET | Freq: Every day | ORAL | Status: DC
  Administered 2022-07-13 – 2022-07-17 (×5): 10 mg via ORAL
  Filled 2022-07-12 (×5): qty 1

## 2022-07-12 MED ORDER — DEXAMETHASONE SODIUM PHOSPHATE 10 MG/ML IJ SOLN
INTRAMUSCULAR | Status: DC | PRN
  Administered 2022-07-12: 4 mg via INTRAVENOUS

## 2022-07-12 MED ORDER — ONDANSETRON HCL 4 MG/2ML IJ SOLN: 4.0000 mg | Freq: Four times a day (QID) | INTRAMUSCULAR | Status: DC | PRN

## 2022-07-12 MED ORDER — NON FORMULARY: 1.0000 [IU] | Freq: Three times a day (TID) | Status: DC

## 2022-07-12 MED ORDER — ORAL CARE MOUTH RINSE: 15.0000 mL | Freq: Once | OROMUCOSAL | Status: DC

## 2022-07-12 MED ORDER — LIDOCAINE HCL (PF) 2 % IJ SOLN
INTRAMUSCULAR | Status: AC
  Filled 2022-07-12: qty 10

## 2022-07-12 MED ORDER — CEFAZOLIN SODIUM-DEXTROSE 2-4 GM/100ML-% IV SOLN
2.0000 g | INTRAVENOUS | Status: AC
  Administered 2022-07-12: 2 g via INTRAVENOUS
  Filled 2022-07-12: qty 100

## 2022-07-12 MED ORDER — INSULIN ASPART 100 UNIT/ML IJ SOLN
0.0000 [IU] | INTRAMUSCULAR | Status: DC
  Administered 2022-07-12: 3 [IU] via SUBCUTANEOUS
  Administered 2022-07-12: 5 [IU] via SUBCUTANEOUS
  Administered 2022-07-12: 3 [IU] via SUBCUTANEOUS
  Administered 2022-07-12 – 2022-07-13 (×3): 2 [IU] via SUBCUTANEOUS
  Administered 2022-07-13 (×3): 3 [IU] via SUBCUTANEOUS
  Administered 2022-07-13 (×2): 2 [IU] via SUBCUTANEOUS
  Administered 2022-07-14: 3 [IU] via SUBCUTANEOUS
  Administered 2022-07-14 (×2): 1 [IU] via SUBCUTANEOUS
  Administered 2022-07-15: 2 [IU] via SUBCUTANEOUS
  Administered 2022-07-15: 3 [IU] via SUBCUTANEOUS
  Administered 2022-07-15: 2 [IU] via SUBCUTANEOUS
  Administered 2022-07-15: 3 [IU] via SUBCUTANEOUS
  Administered 2022-07-15 – 2022-07-16 (×4): 2 [IU] via SUBCUTANEOUS
  Administered 2022-07-16: 3 [IU] via SUBCUTANEOUS
  Administered 2022-07-16 – 2022-07-17 (×3): 2 [IU] via SUBCUTANEOUS

## 2022-07-12 MED ORDER — LACTATED RINGERS IV SOLN: INTRAVENOUS | Status: DC

## 2022-07-12 MED ORDER — ACETAMINOPHEN 500 MG PO TABS: 1000.0000 mg | ORAL_TABLET | Freq: Four times a day (QID) | ORAL | Status: DC

## 2022-07-12 MED ORDER — BUPIVACAINE HCL (PF) 0.25 % IJ SOLN
INTRAMUSCULAR | Status: AC
  Filled 2022-07-12: qty 30

## 2022-07-12 MED ORDER — ROSUVASTATIN CALCIUM 20 MG PO TABS: 40.0000 mg | ORAL_TABLET | Freq: Every day | ORAL | Status: DC

## 2022-07-12 MED ORDER — AMISULPRIDE (ANTIEMETIC) 5 MG/2ML IV SOLN: 10.0000 mg | Freq: Once | INTRAVENOUS | Status: DC | PRN

## 2022-07-12 MED ORDER — ONDANSETRON HCL 4 MG PO TABS: 4.0000 mg | ORAL_TABLET | Freq: Four times a day (QID) | ORAL | Status: DC | PRN

## 2022-07-12 MED ORDER — SUGAMMADEX SODIUM 200 MG/2ML IV SOLN
INTRAVENOUS | Status: DC | PRN
  Administered 2022-07-12: 200 mg via INTRAVENOUS

## 2022-07-12 MED ORDER — ROCURONIUM BROMIDE 10 MG/ML (PF) SYRINGE
PREFILLED_SYRINGE | INTRAVENOUS | Status: DC | PRN
  Administered 2022-07-12: 70 mg via INTRAVENOUS

## 2022-07-12 MED ORDER — FENTANYL CITRATE (PF) 250 MCG/5ML IJ SOLN
INTRAMUSCULAR | Status: AC
  Filled 2022-07-12: qty 5

## 2022-07-12 MED ORDER — FENTANYL CITRATE PF 50 MCG/ML IJ SOSY
PREFILLED_SYRINGE | INTRAMUSCULAR | Status: AC
  Filled 2022-07-12: qty 1

## 2022-07-12 MED ORDER — ROCURONIUM BROMIDE 10 MG/ML (PF) SYRINGE
PREFILLED_SYRINGE | INTRAVENOUS | Status: AC
  Filled 2022-07-12: qty 10

## 2022-07-12 MED ORDER — PROMETHAZINE HCL 25 MG/ML IJ SOLN: 6.2500 mg | INTRAMUSCULAR | Status: DC | PRN

## 2022-07-12 MED ORDER — DEXAMETHASONE SODIUM PHOSPHATE 4 MG/ML IJ SOLN: 4.0000 mg | INTRAMUSCULAR | Status: DC

## 2022-07-12 MED ORDER — CHLORHEXIDINE GLUCONATE 0.12 % MT SOLN: 15.0000 mL | Freq: Once | OROMUCOSAL | Status: DC

## 2022-07-12 MED ORDER — PROPOFOL 10 MG/ML IV BOLUS
INTRAVENOUS | Status: DC | PRN
  Administered 2022-07-12: 130 mg via INTRAVENOUS

## 2022-07-12 MED ORDER — HYDROMORPHONE HCL 1 MG/ML IJ SOLN
0.5000 mg | INTRAMUSCULAR | Status: DC | PRN
  Administered 2022-07-12: 0.5 mg via INTRAVENOUS
  Filled 2022-07-12: qty 0.5

## 2022-07-12 MED ORDER — SUCCINYLCHOLINE CHLORIDE 200 MG/10ML IV SOSY
PREFILLED_SYRINGE | INTRAVENOUS | Status: AC
  Filled 2022-07-12: qty 10

## 2022-07-12 MED ORDER — INSULIN ASPART 100 UNIT/ML IJ SOLN
INTRAMUSCULAR | Status: AC
  Filled 2022-07-12: qty 1

## 2022-07-12 MED ORDER — METRONIDAZOLE 500 MG/100ML IV SOLN
500.0000 mg | INTRAVENOUS | Status: AC
  Administered 2022-07-12: 500 mg via INTRAVENOUS
  Filled 2022-07-12: qty 100

## 2022-07-12 MED ORDER — BUPIVACAINE-EPINEPHRINE (PF) 0.25% -1:200000 IJ SOLN: INTRAMUSCULAR | Status: DC | PRN

## 2022-07-12 MED ORDER — BUPIVACAINE HCL 0.25 % IJ SOLN
INTRAMUSCULAR | Status: AC
  Filled 2022-07-12: qty 1

## 2022-07-12 MED ORDER — SCOPOLAMINE 1 MG/3DAYS TD PT72
1.0000 | MEDICATED_PATCH | TRANSDERMAL | Status: DC
  Administered 2022-07-12: 1.5 mg via TRANSDERMAL
  Filled 2022-07-12: qty 1

## 2022-07-12 MED ORDER — BISACODYL 5 MG PO TBEC: 5.0000 mg | DELAYED_RELEASE_TABLET | Freq: Every day | ORAL | Status: DC | PRN

## 2022-07-12 MED ORDER — MIDAZOLAM HCL 2 MG/2ML IJ SOLN
INTRAMUSCULAR | Status: AC
  Filled 2022-07-12: qty 2

## 2022-07-12 MED ORDER — BUPIVACAINE HCL (PF) 0.25 % IJ SOLN
INTRAMUSCULAR | Status: DC | PRN
  Administered 2022-07-12: 10 mL

## 2022-07-12 MED ORDER — 0.9 % SODIUM CHLORIDE (POUR BTL) OPTIME
TOPICAL | Status: DC | PRN
  Administered 2022-07-12: 2000 mL

## 2022-07-12 MED ORDER — FENTANYL CITRATE (PF) 100 MCG/2ML IJ SOLN
INTRAMUSCULAR | Status: DC | PRN
  Administered 2022-07-12: 75 ug via INTRAVENOUS
  Administered 2022-07-12: 50 ug via INTRAVENOUS
  Administered 2022-07-12: 100 ug via INTRAVENOUS
  Administered 2022-07-12: 25 ug via INTRAVENOUS

## 2022-07-12 MED ORDER — DEXAMETHASONE SODIUM PHOSPHATE 10 MG/ML IJ SOLN
INTRAMUSCULAR | Status: AC
  Filled 2022-07-12: qty 1

## 2022-07-12 MED ORDER — PHENYLEPHRINE HCL-NACL 20-0.9 MG/250ML-% IV SOLN
INTRAVENOUS | Status: DC | PRN
  Administered 2022-07-12: 30 ug/min via INTRAVENOUS

## 2022-07-12 MED ORDER — HEPARIN SODIUM (PORCINE) 5000 UNIT/ML IJ SOLN
5000.0000 [IU] | INTRAMUSCULAR | Status: AC
  Administered 2022-07-12: 5000 [IU] via SUBCUTANEOUS
  Filled 2022-07-12: qty 1

## 2022-07-12 MED ORDER — FENTANYL CITRATE PF 50 MCG/ML IJ SOSY
25.0000 ug | PREFILLED_SYRINGE | INTRAMUSCULAR | Status: DC | PRN
  Administered 2022-07-12: 50 ug via INTRAVENOUS

## 2022-07-12 MED ORDER — ONDANSETRON HCL 4 MG/2ML IJ SOLN
INTRAMUSCULAR | Status: DC | PRN
  Administered 2022-07-12: 4 mg via INTRAVENOUS

## 2022-07-12 MED ORDER — SENNOSIDES-DOCUSATE SODIUM 8.6-50 MG PO TABS: 1.0000 | ORAL_TABLET | Freq: Every evening | ORAL | Status: DC | PRN

## 2022-07-12 MED ORDER — MIDAZOLAM HCL 5 MG/5ML IJ SOLN
INTRAMUSCULAR | Status: DC | PRN
  Administered 2022-07-12: 2 mg via INTRAVENOUS

## 2022-07-12 SURGICAL SUPPLY — 63 items
APL PRP STRL LF DISP 70% ISPRP (MISCELLANEOUS) ×1
BAG COUNTER SPONGE SURGICOUNT (BAG) IMPLANT
BAG SPNG CNTER NS LX DISP (BAG)
BLADE HEX COATED 2.75 (ELECTRODE) ×1 IMPLANT
CHLORAPREP W/TINT 26 (MISCELLANEOUS) ×1 IMPLANT
COVER MAYO STAND STRL (DRAPES) ×1 IMPLANT
DRAPE LAPAROSCOPIC ABDOMINAL (DRAPES) ×1 IMPLANT
DRAPE UTILITY XL STRL (DRAPES) IMPLANT
DRAPE WARM FLUID 44X44 (DRAPES) ×1 IMPLANT
DRSG OPSITE POSTOP 4X10 (GAUZE/BANDAGES/DRESSINGS) IMPLANT
DRSG OPSITE POSTOP 4X6 (GAUZE/BANDAGES/DRESSINGS) IMPLANT
DRSG OPSITE POSTOP 4X8 (GAUZE/BANDAGES/DRESSINGS) IMPLANT
DRSG TEGADERM 4X4.75 (GAUZE/BANDAGES/DRESSINGS) IMPLANT
DRSG TELFA 3X8 NADH STRL (GAUZE/BANDAGES/DRESSINGS) IMPLANT
ELECT PENCIL ROCKER SW 15FT (MISCELLANEOUS) IMPLANT
ELECT REM PT RETURN 15FT ADLT (MISCELLANEOUS) ×1 IMPLANT
GAUZE PACKING IODOFORM 1/2INX (GAUZE/BANDAGES/DRESSINGS) IMPLANT
GAUZE PACKING IODOFORM 1/4X15 (PACKING) IMPLANT
GAUZE SPONGE 2X2 8PLY STRL LF (GAUZE/BANDAGES/DRESSINGS) IMPLANT
GAUZE SPONGE 4X4 12PLY STRL (GAUZE/BANDAGES/DRESSINGS) ×1 IMPLANT
GLOVE BIO SURGEON STRL SZ 6 (GLOVE) IMPLANT
GLOVE BIO SURGEON STRL SZ 6.5 (GLOVE) ×2 IMPLANT
GLOVE BIOGEL PI IND STRL 7.0 (GLOVE) ×1 IMPLANT
GLOVE INDICATOR 6.5 STRL GRN (GLOVE) ×1 IMPLANT
GOWN STRL REUS W/ TWL LRG LVL3 (GOWN DISPOSABLE) IMPLANT
GOWN STRL REUS W/ TWL XL LVL3 (GOWN DISPOSABLE) ×2 IMPLANT
GOWN STRL REUS W/TWL LRG LVL3 (GOWN DISPOSABLE) ×2
GOWN STRL REUS W/TWL XL LVL3 (GOWN DISPOSABLE)
HANDLE SUCTION POOLE (INSTRUMENTS) ×1 IMPLANT
HOLDER FOLEY CATH W/STRAP (MISCELLANEOUS) IMPLANT
KIT BASIN OR (CUSTOM PROCEDURE TRAY) ×1 IMPLANT
KIT TURNOVER KIT A (KITS) IMPLANT
LIGASURE IMPACT 36 18CM CVD LR (INSTRUMENTS) IMPLANT
MANIFOLD NEPTUNE II (INSTRUMENTS) ×1 IMPLANT
NDL HYPO 21X1.5 SAFETY (NEEDLE) IMPLANT
NEEDLE HYPO 21X1.5 SAFETY (NEEDLE) ×1 IMPLANT
PACK GENERAL/GYN (CUSTOM PROCEDURE TRAY) ×1 IMPLANT
RELOAD PROXIMATE 75MM BLUE (ENDOMECHANICALS) ×5 IMPLANT
RELOAD STAPLE 75 3.8 BLU REG (ENDOMECHANICALS) IMPLANT
SPIKE FLUID TRANSFER (MISCELLANEOUS) IMPLANT
STAPLER GUN LINEAR PROX 60 (STAPLE) IMPLANT
STAPLER PROXIMATE 75MM BLUE (STAPLE) IMPLANT
STAPLER VISISTAT 35W (STAPLE) IMPLANT
SUCTION POOLE HANDLE (INSTRUMENTS) ×1
SUT NOVA NAB DX-16 0-1 5-0 T12 (SUTURE) ×1 IMPLANT
SUT NOVA NAB GS-21 0 18 T12 DT (SUTURE) IMPLANT
SUT PDS AB 1 TP1 96 (SUTURE) IMPLANT
SUT PROLENE 2 0 BLUE (SUTURE) IMPLANT
SUT SILK 2 0 (SUTURE)
SUT SILK 2 0 SH CR/8 (SUTURE) ×1 IMPLANT
SUT SILK 2-0 18XBRD TIE 12 (SUTURE) ×1 IMPLANT
SUT SILK 3 0 (SUTURE)
SUT SILK 3 0 SH CR/8 (SUTURE) ×1 IMPLANT
SUT SILK 3-0 18XBRD TIE 12 (SUTURE) ×1 IMPLANT
SUT VIC AB 2-0 SH 18 (SUTURE) IMPLANT
SUT VIC AB 2-0 SH 27 (SUTURE) ×6
SUT VIC AB 2-0 SH 27X BRD (SUTURE) ×2 IMPLANT
SUT VIC AB 4-0 PS2 18 (SUTURE) ×1 IMPLANT
SYR 20ML LL LF (SYRINGE) IMPLANT
TOWEL OR 17X26 10 PK STRL BLUE (TOWEL DISPOSABLE) ×2 IMPLANT
TOWEL OR NON WOVEN STRL DISP B (DISPOSABLE) ×2 IMPLANT
YANKAUER SUCT BULB TIP 10FT TU (MISCELLANEOUS) IMPLANT
YANKAUER SUCT BULB TIP NO VENT (SUCTIONS) ×1 IMPLANT

## 2022-07-12 NOTE — Transfer of Care (Signed)
Immediate Anesthesia Transfer of Care Note  Patient: Marie Hill  Procedure(s) Performed: ILEOSTOMY TAKEDOWN  Patient Location: PACU  Anesthesia Type:General  Level of Consciousness: awake, alert , oriented, and patient cooperative  Airway & Oxygen Therapy: Patient Spontanous Breathing and Patient connected to face mask oxygen  Post-op Assessment: Report given to RN and Post -op Vital signs reviewed and stable  Post vital signs: Reviewed and stable  Last Vitals:  Vitals Value Taken Time  BP 143/89 07/12/22 0934  Temp    Pulse 73 07/12/22 0936  Resp 16 07/12/22 0936  SpO2 100 % 07/12/22 0936  Vitals shown include unvalidated device data.  Last Pain:  Vitals:   07/12/22 0607  TempSrc: Oral  PainSc:          Complications: No notable events documented.

## 2022-07-12 NOTE — Anesthesia Procedure Notes (Signed)
Procedure Name: Intubation Date/Time: 07/12/2022 7:29 AM  Performed by: Ponciano Ort, CRNAPre-anesthesia Checklist: Patient identified, Emergency Drugs available, Suction available and Patient being monitored Patient Re-evaluated:Patient Re-evaluated prior to induction Oxygen Delivery Method: Circle system utilized Preoxygenation: Pre-oxygenation with 100% oxygen Induction Type: IV induction Ventilation: Mask ventilation without difficulty and Oral airway inserted - appropriate to patient size Laryngoscope Size: Mac and 3 Grade View: Grade II Tube type: Oral Tube size: 7.0 mm Number of attempts: 1 Airway Equipment and Method: Stylet and Oral airway Placement Confirmation: ETT inserted through vocal cords under direct vision, positive ETCO2 and breath sounds checked- equal and bilateral Secured at: 21 cm Tube secured with: Tape Dental Injury: Teeth and Oropharynx as per pre-operative assessment

## 2022-07-12 NOTE — Anesthesia Postprocedure Evaluation (Signed)
Anesthesia Post Note  Patient: Marie Hill  Procedure(s) Performed: ILEOSTOMY TAKEDOWN     Patient location during evaluation: PACU Anesthesia Type: General Level of consciousness: sedated Pain management: pain level controlled Vital Signs Assessment: post-procedure vital signs reviewed and stable Respiratory status: spontaneous breathing and respiratory function stable Cardiovascular status: stable Postop Assessment: no apparent nausea or vomiting Anesthetic complications: no  No notable events documented.  Last Vitals:  Vitals:   07/12/22 1030 07/12/22 1052  BP: (!) 148/83 122/77  Pulse: 85 75  Resp: 16 16  Temp:  36.6 C  SpO2: 97% 98%    Last Pain:  Vitals:   07/12/22 1052  TempSrc: Oral  PainSc:                  Marie Hill DANIEL

## 2022-07-12 NOTE — Discharge Instructions (Addendum)
AFTER SURGERY INSTRUCTIONS   Return to work: 4-6 weeks if applicable  Plan on changing the dressing on your abdomen once a day.  We would recommend following up with your primary care provider about taking crestor (cholesterol medication) with your recent elevated liver enzmes.   Activity: 1. Be up and out of the bed during the day.  Take a nap if needed.  You may walk up steps but be careful and use the hand rail.  Stair climbing will tire you more than you think, you may need to stop part way and rest.    2. No lifting or straining for 6 weeks over 10 pounds. No pushing, pulling, straining for 6 weeks.   3. No driving for around 1 week(s).  Do not drive if you are taking narcotic pain medicine and make sure that your reaction time has returned.    4. You can shower as soon as the next day after surgery. Shower daily.  Use your regular soap and water (not directly on the incision) and pat your incision(s) dry afterwards; don't rub.  No tub baths or submerging your body in water until cleared by your surgeon. If you have the soap that was given to you by pre-surgical testing that was used before surgery, you do not need to use it afterwards because this can irritate your incisions.    5. You may experience a small amount of clear drainage from your incision, which is normal.  If the drainage persists, increases, or changes color please call the office.   6. Do not use creams, lotions, or ointments such as neosporin on your incision after surgery until advised by your surgeon.   7. Take ibuprofen first for pain if you are able to take these medication and only use narcotic pain medication for severe pain not relieved by the ibuprofen.  Would avoid tylenol use given your elevated liver function tests.    Diet: 1. Low sodium Heart Healthy Diet is recommended but you are cleared to resume your normal (before surgery) diet after your procedure.   Wound Care: 1. Keep clean and dry.  Shower  daily.   Reasons to call the Doctor: Fever - Oral temperature greater than 100.4 degrees Fahrenheit Foul-smelling vaginal discharge Difficulty urinating Nausea and vomiting Increased pain at the site of the incision that is unrelieved with pain medicine. Difficulty breathing with or without chest pain New calf pain especially if only on one side Sudden, continuing increased vaginal bleeding with or without clots.   Contacts: For questions or concerns you should contact:   Dr. Eugene Garnet at [phone]   Warner Mccreedy, NP at [phone]   After Hours: call [phone] and have the GYN Oncologist paged/contacted (after 5 pm or on the weekends). You will speak with an after hours RN and let he or she know you have had surgery.   Messages sent via mychart are for non-urgent matters and are not responded to after hours so for urgent needs, please call the after hours number.

## 2022-07-12 NOTE — Interval H&P Note (Signed)
History and Physical Interval Note:  07/12/2022 6:55 AM  Marie Hill  has presented today for surgery, with the diagnosis of Ileostomy in place.  The various methods of treatment have been discussed with the patient and family. After consideration of risks, benefits and other options for treatment, the patient has consented to  Procedure(s): ILEOSTOMY TAKEDOWN (N/A) as a surgical intervention.  The patient's history has been reviewed, patient examined, no change in status, stable for surgery.  I have reviewed the patient's chart and labs.  Questions were answered to the patient's satisfaction.     Carver Fila

## 2022-07-12 NOTE — Progress Notes (Signed)
negative urine hcg poct test as of 07/12/22

## 2022-07-12 NOTE — Op Note (Signed)
Operative Report  Pre-operative Diagnosis: Ileostomy, planned reversal   Post-operative Diagnosis: same as above   Operation: Ileostomy takedown   Surgeon: Eugene Garnet MD   Assistant Surgeon: Antionette Char MD (an MD assistant was necessary for tissue manipulation, management of robotic instrumentation, retraction and positioning due to the complexity of the case and hospital policies).    Anesthesia: GET   Urine Output: 150 cc   Operative Findings: Normal-appearing loop ileostomy with some mild retraction.  Fascia easily identified and normal in appearance.  Ileum freed from surrounding anterior abdominal wall peritoneum. Some dense adhesions between the loop of ileum at the surrounding peritoneum (especially the proximal end).    Estimated Blood Loss:  200 cc       Total IV Fluids: see I&O flowsheet         Specimens: proximal and distal ends of the ileostomy         Complications:  None apparent; patient tolerated the procedure well.         Disposition: PACU - hemodynamically stable.   Procedure Details  The patient was seen in the Holding Room. The risks, benefits, complications, treatment options, and expected outcomes were discussed with the patient.  The patient concurred with the proposed plan, giving informed consent.  The site of surgery properly noted/marked. The patient was identified as Mickie Bail and the procedure verified as an ileostomy reversal.    After induction of anesthesia, the urethra with prepped with Betadine and a Foley catheter was placed.  2 interrupted sutures were used to close the afferent and efferent ends of the ileostomy.  The patient was draped and prepped in the usual sterile manner.  The patient's abdomen was prepped with ChloraPrep and then she was draped after the prep had been allowed to dry for 3 minutes.  The stoma itself was cleansed with Betadine.  A Time Out was held and the above information confirmed.   A circular incision  was made using the scalpel followed by Bovie monopolar electrocautery around the skin of the ostomy site.  Next, the subcutaneous tissue was carefully dissected down to the bowel into the underlying fascia.  This was done with a combination of electrocautery, Metzenbaum scissors, and blunt dissection.  Once to the interface of the bowel and the fascia, scalpel was used to incise the anterior fascia.  The interface between the bowel and the peritoneum was then identified with some difficulty and entered sharply.  Remaining filmy attachments between the bowel and the fascia were undermined with the finger and taken down with the Bovie until the bowel and mesentery were completely freed. During this dissection, the proximal end of the ileum was quite adherent to the anterior abdominal wall and concern for some serosal injury was noted along the segment of the ileum just proximal to the ileostomy. The fascia and underlying muscle was incised interiorly 1-2 cm past the fascial defect from the ostomy to help during mobilization of the ileum.   The ostomy was gently elevated upwards.  Using a hemostat, a defect was created in the mesentery of the ileum below the level of the ileostomy.  2 large bowel clamps were then placed under the stoma across the ileum.  GIA staplers were then used to staple and transect the ileum, removing the prior ileostomy.  This was passed off the field for permanent pathology. The deserosalized segment of ileum was followed until intact bowel was noted circumferentially. This ended up being approximately 8-10 cm of ileum.  A defect was created in the mesentery at this location and a GIA stapler was used to staple and transect the ileum. The Ligasure device was used to cauterize and transect the mesentery to free this segment of ileum. The suture lines were then excised and the open lumens were suspended upwards with Allis clamps.  3 stay sutures using 2-0 Vicryl were used to approximate  antimesenteric surfaces of the ileum to each other.  A GIA staple load was then used to create a side-to-side anastomosis between the distal and proximal ends of the ileum.  Care was taken to ensure that the bowel walls were oriented antimesenteric to antimesenteric before deployment staple load.  Next, the TA stapler was brought onto the field and the staple load was fired across the top of the anastomosis.  Metzenbaum scissors were used to remove the small excess tissue above the stapler.  The lumen of the side-to-side anastomosis was sufficient with approximately 3-4 cm width.  The small defect in the mesentery was closed with interrupted sutures of 2-0 Vicryl and the staple line itself was oversewn with interrupted 2-0 Vicryl.  Meticulous hemostasis was noted.  The bowel was then gently replaced into the abdominal cavity and noted to be free of any tension.   The fascia was closed with #1 looped PDS suture in a running fashion, tied at the superior aspect of the incision.  A bleeding vessels was made hemostatic along the left aspect of the fascia with 2-0 Silk. The subcutaneous tissue was irrigated copiously and tissue made hemostatic.  Exparel was injected for local anesthesia.  Next, 2-0 Vicryl was used to reapproximate the subcutaneous tissue in pursestring fashion.  This was done in 4 layers.  Iodoform packing gauze and a 2 x 2 were then used to cover the opened incision followed by a Tegaderm.   Foley catheter was left in situ.  All sponge, lap and needle counts were correct x  3.    The patient was transferred to the recovery room in stable condition.  Eugene Garnet MD Gynecologic Oncology

## 2022-07-12 NOTE — TOC CM/SW Note (Signed)
Transition of Care [facility]) - Inpatient Brief Assessment   Patient Details  Name: Brittyn Klecha MRN: [id number] Date of Birth: 12-27-1980  Transition of Care [facility]) CM/SW Contact:    Amada Jupiter, LCSW Phone Number: 07/12/2022, 3:17 PM   Clinical Narrative:  NA  Transition of Care Asessment: Insurance and Status: Insurance coverage has been reviewed Patient has primary care physician: Yes Home environment has been reviewed: home with spouse Prior level of function:: independent Prior/Current Home Services: No current home services Social Determinants of Health Reivew: SDOH reviewed no interventions necessary Readmission risk has been reviewed: Yes Transition of care needs: no transition of care needs at this time

## 2022-07-13 ENCOUNTER — Encounter (HOSPITAL_COMMUNITY): Payer: Self-pay | Admitting: Gynecologic Oncology

## 2022-07-13 LAB — CBC
HCT: 36.6 % (ref 36.0–46.0)
Hemoglobin: 11.9 g/dL — ABNORMAL LOW (ref 12.0–15.0)
MCH: 26.8 pg (ref 26.0–34.0)
MCHC: 32.5 g/dL (ref 30.0–36.0)
MCV: 82.4 fL (ref 80.0–100.0)
Platelets: 301 10*3/uL (ref 150–400)
RBC: 4.44 MIL/uL (ref 3.87–5.11)
RDW: 13.3 % (ref 11.5–15.5)
WBC: 11.2 10*3/uL — ABNORMAL HIGH (ref 4.0–10.5)
nRBC: 0 % (ref 0.0–0.2)

## 2022-07-13 LAB — BASIC METABOLIC PANEL
Anion gap: 10 (ref 5–15)
BUN: 9 mg/dL (ref 6–20)
CO2: 25 mmol/L (ref 22–32)
Calcium: 9.5 mg/dL (ref 8.9–10.3)
Chloride: 101 mmol/L (ref 98–111)
Creatinine, Ser: 0.61 mg/dL (ref 0.44–1.00)
GFR, Estimated: >60 mL/min (ref >60)
Glucose, Bld: 166 mg/dL — ABNORMAL HIGH (ref 70–99)
Potassium: 3.6 mmol/L (ref 3.5–5.1)
Sodium: 136 mmol/L (ref 135–145)

## 2022-07-13 LAB — GLUCOSE, CAPILLARY
Glucose-Capillary: 130 mg/dL — ABNORMAL HIGH (ref 70–99)
Glucose-Capillary: 136 mg/dL — ABNORMAL HIGH (ref 70–99)
Glucose-Capillary: 137 mg/dL — ABNORMAL HIGH (ref 70–99)
Glucose-Capillary: 149 mg/dL — ABNORMAL HIGH (ref 70–99)
Glucose-Capillary: 159 mg/dL — ABNORMAL HIGH (ref 70–99)
Glucose-Capillary: 165 mg/dL — ABNORMAL HIGH (ref 70–99)

## 2022-07-13 LAB — SURGICAL PATHOLOGY

## 2022-07-13 NOTE — Progress Notes (Signed)
GYN Oncology Progress Note  1 Day Post-Op Procedure(s) (LRB): ILEOSTOMY TAKEDOWN (N/A)  Subjective: Patient reports doing well this am. Did not drink much yesterday. Did not feel thirsty with this improved this am. No flatus. No nausea or emesis. No belching. Pain controlled. Voiding without difficulty. No concerns voiced.  Objective: Vital signs in last 24 hours: Temp:  [97.8 F (36.6 C)-98.9 F (37.2 C)] 98.5 F (36.9 C) (05/30 0800) Pulse Rate:  [70-86] 73 (05/30 0800) Resp:  [16-18] 18 (05/30 0800) BP: (117-148)/(73-83) 124/77 (05/30 0800) SpO2:  [97 %-100 %] 100 % (05/30 0800) Last BM Date : 07/12/22  Intake/Output from previous day: 05/29 0701 - 05/30 0700 In: 4639.6 [P.O.:1060; I.V.:3579.6] Out: 4400 [Urine:4250; Blood:150]  Physical Examination: General: alert, cooperative, and no distress Resp: clear to auscultation bilaterally Cardio: regular rate and rhythm, S1, S2 normal, no murmur, click, rub or gallop GI: incision: gauze dressing over previous ileostomy site intact without significant drainage and abdomen soft, active bowel sounds, non-distended Extremities: extremities normal, atraumatic, no cyanosis or edema  Labs: WBC/Hgb/Hct/Plts:  11.2/11.9/36.6/301 (05/30 0446) BUN/Cr/glu/ALT/AST/amyl/lip:  9/0.61/--/--/--/--/-- (05/30 1610)  Assessment: 42 y.o. s/p Procedure(s): ILEOSTOMY TAKEDOWN: stable Pain:  Pain is well-controlled on PRN medications.  Heme: Hgb 11.9 and Hct 36.6 this am. Appropriate given preop values and surgical losses.  ID: WBC 11.2. Given decadron intra-op along with ancef IV. No evidence of infection at this time.  CV: BP and HR stable. Continue to monitor with ordered vital sign assessments.  GI:  Tolerating po: yes clear liquids. Diet to be advanced when patient starts passing flatus.  GU: Voiding adequate amounts. Creatinine 0.61 this am.     FEN: No critical values on am labs.  Endo: Diabetes mellitus Type II, under good control.   CBG: CBG (last 3)  Recent Labs    07/12/22 2356 07/13/22 0436 07/13/22 0714  GLUCAP 157* 159* 136*     Prophylaxis: SCDs.  Plan: If + flatus, can advance diet to soft IV to saline lock Encourage increasing mobility Continue plan of care per Dr. Pricilla Holm AM labs ordered When ready for discharge, pt will be discharged home   LOS: 1 day    Kohei Antonellis D Malone Vanblarcom 07/13/2022, 10:25 AM

## 2022-07-13 NOTE — Progress Notes (Signed)
Mobility Specialist - Progress Note   07/13/22 1128  Mobility  Activity Ambulated with assistance in hallway  Level of Assistance Modified independent, requires aide device or extra time  Assistive Device None  Distance Ambulated (ft) 260 ft  Range of Motion/Exercises Active  Activity Response Tolerated well  Mobility Referral Yes  $Mobility charge 1 Mobility  Mobility Specialist Start Time (ACUTE ONLY) 1115  Mobility Specialist Stop Time (ACUTE ONLY) 1125  Mobility Specialist Time Calculation (min) (ACUTE ONLY) 10 min   Pt received in bed and agreed to mobility, had no issues throughout session, returned to bed with all needs met.  Marilynne Halsted Mobility Specialist

## 2022-07-14 LAB — GLUCOSE, CAPILLARY
Glucose-Capillary: 117 mg/dL — ABNORMAL HIGH (ref 70–99)
Glucose-Capillary: 133 mg/dL — ABNORMAL HIGH (ref 70–99)
Glucose-Capillary: 134 mg/dL — ABNORMAL HIGH (ref 70–99)
Glucose-Capillary: 144 mg/dL — ABNORMAL HIGH (ref 70–99)
Glucose-Capillary: 174 mg/dL — ABNORMAL HIGH (ref 70–99)
Glucose-Capillary: 182 mg/dL — ABNORMAL HIGH (ref 70–99)

## 2022-07-14 LAB — BASIC METABOLIC PANEL
Anion gap: 9 (ref 5–15)
BUN: 12 mg/dL (ref 6–20)
CO2: 25 mmol/L (ref 22–32)
Calcium: 9.3 mg/dL (ref 8.9–10.3)
Chloride: 103 mmol/L (ref 98–111)
Creatinine, Ser: 0.56 mg/dL (ref 0.44–1.00)
GFR, Estimated: >60 mL/min (ref >60)
Glucose, Bld: 174 mg/dL — ABNORMAL HIGH (ref 70–99)
Potassium: 3.5 mmol/L (ref 3.5–5.1)
Sodium: 137 mmol/L (ref 135–145)

## 2022-07-14 LAB — CBC
HCT: 36.3 % (ref 36.0–46.0)
Hemoglobin: 11.5 g/dL — ABNORMAL LOW (ref 12.0–15.0)
MCH: 26.2 pg (ref 26.0–34.0)
MCHC: 31.7 g/dL (ref 30.0–36.0)
MCV: 82.7 fL (ref 80.0–100.0)
Platelets: 278 10*3/uL (ref 150–400)
RBC: 4.39 MIL/uL (ref 3.87–5.11)
RDW: 13.4 % (ref 11.5–15.5)
WBC: 11.5 10*3/uL — ABNORMAL HIGH (ref 4.0–10.5)
nRBC: 0 % (ref 0.0–0.2)

## 2022-07-14 NOTE — Progress Notes (Signed)
GYN Oncology Progress Note  Attempted to call patient on room phone to check-in.  No answer.  Will reach out to RN for update.

## 2022-07-14 NOTE — Progress Notes (Signed)
Mobility Specialist - Progress Note   07/14/22 1303  Mobility  Activity Ambulated independently in hallway  Level of Assistance Independent  Assistive Device None  Distance Ambulated (ft) 250 ft  Activity Response Tolerated well  Mobility Referral Yes  $Mobility charge 1 Mobility  Mobility Specialist Start Time (ACUTE ONLY) 1256  Mobility Specialist Stop Time (ACUTE ONLY) 1303  Mobility Specialist Time Calculation (min) (ACUTE ONLY) 7 min   Pt received in bed and agreeable to mobility. No complaints during session. Ambulation limited d/t pain. Pt to recliner after session with all needs met.    [facility]

## 2022-07-14 NOTE — Progress Notes (Signed)
GYN Oncology Progress Note  2 Days Post-Op Procedure(s) (LRB): ILEOSTOMY TAKEDOWN (N/A)  Subjective: Patient reports passing flatus. Feeling abdomen rumbling. Voiding without difficulty. Has nauseous feeling but no emesis. Belching intermittently. Pain controlled. Ambulating. No concerns voiced.  Objective: Vital signs in last 24 hours: Temp:  [98.2 F (36.8 C)-98.5 F (36.9 C)] 98.2 F (36.8 C) (05/31 0453) Pulse Rate:  [73-91] 75 (05/31 0453) Resp:  [15-18] 18 (05/31 0453) BP: (100-124)/(61-77) 124/74 (05/31 0453) SpO2:  [94 %-100 %] 94 % (05/31 0453) Last BM Date : 07/12/22  Intake/Output from previous day: 05/30 0701 - 05/31 0700 In: 1040 [P.O.:1040] Out: 1800 [Urine:1800]  Physical Examination: General: alert, cooperative, and no distress Resp: clear to auscultation bilaterally Cardio: regular rate and rhythm, S1, S2 normal, no murmur, click, rub or gallop GI: incision: gauze dressing over previous ileostomy site intact without significant drainage noted underneath tegaderm and abdomen soft, midly hypoactive bowel sounds, non-distended Extremities: extremities normal, atraumatic, no cyanosis or edema  Labs: WBC/Hgb/Hct/Plts:  11.5/11.5/36.3/278 (05/31 0503) BUN/Cr/glu/ALT/AST/amyl/lip:  12/0.56/--/--/--/--/-- (05/31 0503)  Assessment: 42 y.o. s/p Procedure(s): ILEOSTOMY TAKEDOWN: stable Pain:  Pain is well-controlled on PRN medications.  Heme: Hgb 11.5 and Hct 36.3 this am from 11.9 and Hct 36.6 yesterday am. Stable and appropriate given preop values and surgical losses.  ID: WBC 11.5 from 11.2. Given decadron intra-op along with ancef IV. No evidence of infection at this time.  CV: BP and HR stable. Continue to monitor with ordered vital sign assessments.  GI:  Tolerating po: yes clear liquids. Diet to be advanced today.  GU: Voiding adequate amounts. Creatinine 0.56 from 0.61 yesterday am.     FEN: No critical values on am labs.  Endo: Diabetes mellitus Type  II, under good control.  CBG: CBG (last 3)  Recent Labs    07/13/22 2333 07/14/22 0451 07/14/22 0728  GLUCAP 130* 182* 133*     Prophylaxis: SCDs.  Plan: Advance diet to soft. Pt advised to take intake slowly Encourage increasing mobility Continue plan of care per Dr. Pricilla Holm AM labs ordered When ready for discharge, pt will be discharged home   LOS: 2 days    Doylene Bode 07/14/2022, 7:42 AM

## 2022-07-15 LAB — MAGNESIUM: Magnesium: 1.8 mg/dL (ref 1.7–2.4)

## 2022-07-15 LAB — GLUCOSE, CAPILLARY
Glucose-Capillary: 130 mg/dL — ABNORMAL HIGH (ref 70–99)
Glucose-Capillary: 132 mg/dL — ABNORMAL HIGH (ref 70–99)
Glucose-Capillary: 134 mg/dL — ABNORMAL HIGH (ref 70–99)
Glucose-Capillary: 139 mg/dL — ABNORMAL HIGH (ref 70–99)
Glucose-Capillary: 156 mg/dL — ABNORMAL HIGH (ref 70–99)
Glucose-Capillary: 199 mg/dL — ABNORMAL HIGH (ref 70–99)

## 2022-07-15 LAB — CBC
HCT: 36.1 % (ref 36.0–46.0)
Hemoglobin: 11.7 g/dL — ABNORMAL LOW (ref 12.0–15.0)
MCH: 26.4 pg (ref 26.0–34.0)
MCHC: 32.4 g/dL (ref 30.0–36.0)
MCV: 81.5 fL (ref 80.0–100.0)
Platelets: 294 10*3/uL (ref 150–400)
RBC: 4.43 MIL/uL (ref 3.87–5.11)
RDW: 13.2 % (ref 11.5–15.5)
WBC: 10.4 10*3/uL (ref 4.0–10.5)
nRBC: 0 % (ref 0.0–0.2)

## 2022-07-15 LAB — BASIC METABOLIC PANEL
Anion gap: 3 — ABNORMAL LOW (ref 5–15)
BUN: 14 mg/dL (ref 6–20)
CO2: 28 mmol/L (ref 22–32)
Calcium: 8.7 mg/dL — ABNORMAL LOW (ref 8.9–10.3)
Chloride: 103 mmol/L (ref 98–111)
Creatinine, Ser: 0.41 mg/dL — ABNORMAL LOW (ref 0.44–1.00)
GFR, Estimated: >60 mL/min (ref >60)
Glucose, Bld: 125 mg/dL — ABNORMAL HIGH (ref 70–99)
Potassium: 3.1 mmol/L — ABNORMAL LOW (ref 3.5–5.1)
Sodium: 134 mmol/L — ABNORMAL LOW (ref 135–145)

## 2022-07-15 MED ORDER — IBUPROFEN 400 MG PO TABS: 600.0000 mg | ORAL_TABLET | Freq: Four times a day (QID) | ORAL | Status: DC | PRN

## 2022-07-15 MED ORDER — POTASSIUM CHLORIDE CRYS ER 20 MEQ PO TBCR
20.0000 meq | EXTENDED_RELEASE_TABLET | Freq: Two times a day (BID) | ORAL | Status: DC
  Administered 2022-07-15 – 2022-07-17 (×5): 20 meq via ORAL
  Filled 2022-07-15 (×5): qty 1

## 2022-07-15 NOTE — Progress Notes (Signed)
Mobility Specialist - Progress Note   07/15/22 1021  Mobility  Activity Ambulated independently in hallway  Level of Assistance Independent after set-up  Assistive Device None  Distance Ambulated (ft) 260 ft  Range of Motion/Exercises Active  Activity Response Tolerated well  Mobility Referral Yes  $Mobility charge 1 Mobility  Mobility Specialist Start Time (ACUTE ONLY) 0915  Mobility Specialist Stop Time (ACUTE ONLY) [id number]  Mobility Specialist Time Calculation (min) (ACUTE ONLY) 7 min   Pt agreeable to mobility, reported  "pulling pain" when sitting EOB, but stated it resolved during ambulation. No other complaints to report. Pt returned to room and was left in recliner with call bell at side.    Arliss Journey Mobility Specialist Acute Rehabilitation Services Phone: [phone] 07/15/22, 10:23 AM

## 2022-07-15 NOTE — Progress Notes (Addendum)
3 Days Post-Op Procedure(s) (LRB): ILEOSTOMY TAKEDOWN (N/A)  Subjective: Intermittent periods of abdominal cramping, otherwise denies significant pain.  Needs to have small bout of flatus.  Denies any nausea or emesis.  Denies pain.  Walked twice in the hall yesterday.  Drank several bottles of water, some soda and juice, ate some solid food. Voiding freely.  Objective: Vital signs in last 24 hours: Temp:  [98.3 F (36.8 C)-98.5 F (36.9 C)] 98.5 F (36.9 C) (06/01 0524) Pulse Rate:  [89-92] 91 (06/01 0524) Resp:  [18] 18 (06/01 0524) BP: (109-128)/(70-81) 128/81 (06/01 0524) SpO2:  [96 %-97 %] 96 % (06/01 0524) Last BM Date : 07/12/22  Intake/Output from previous day: 05/31 0701 - 06/01 0700 In: 940 [P.O.:940] Out: 900 [Urine:600; Stool:300] Urine is incorrectly charted as stool overnight. Patient denies any bowel movement.  Physical Examination: Gen: No acute distress, alert and oriented HEENT: Atraumatic, normocephalic, anicteric sclera Cardiovascular: Regular rate and rhythm, no murmurs or rubs appreciated Pulmonary: Lungs are clear to auscultation bilaterally, no wheezes or rhonchi Abdomen: Soft, mildly distended, mildly tympanitic, hypoactive bowel sounds, incision without surrounding erythema or induration, bandage in place Extremities: Warm and well-perfused, no edema or calf tenderness to palpation  Labs:    Latest Ref Rng & Units 07/15/2022    5:30 AM 07/14/2022    5:03 AM 07/13/2022    4:46 AM  CBC  WBC 4.0 - 10.5 K/uL 10.4  11.5  11.2   Hemoglobin 12.0 - 15.0 g/dL 40.9  81.1  91.4   Hematocrit 36.0 - 46.0 % 36.1  36.3  36.6   Platelets 150 - 400 K/uL 294  278  301       Latest Ref Rng & Units 07/15/2022    5:30 AM 07/14/2022    5:03 AM 07/13/2022    4:46 AM  BMP  Glucose 70 - 99 mg/dL 782  956  213   BUN 6 - 20 mg/dL 14  12  9    Creatinine 0.44 - 1.00 mg/dL 0.86  5.78  4.69   Sodium 135 - 145 mmol/L 134  137  136   Potassium 3.5 - 5.1 mmol/L 3.1  3.5  3.6    Chloride 98 - 111 mmol/L 103  103  101   CO2 22 - 32 mmol/L 28  25  25    Calcium 8.9 - 10.3 mg/dL 8.7  9.3  9.5    Assessment:  42 y.o. s/p Procedure(s): ILEOSTOMY TAKEDOWN: Progressing well  Postop: Pain well-controlled, meeting most postoperative milestones.  Has return of bowel function although hypoactive bowel sounds and some abdominal cramping.  She is at significant risk of postoperative ileus.  Hypokalemia: Potassium repletion ordered.  Heme: Mild acute anemia secondary to surgical blood loss.  Asymptomatic, will continue to monitor.  Endocrine: Type 2 diabetes with last hemoglobin A1c 6.6%.  Glucose monitoring ordered, continue sliding scale.  Prophylaxis: SCDs, ambulation.  Plan: Advance diet as tolerated. Awaiting evidence of continued bowel function.  The patient is to be discharged to home.   LOS: 3 days    Carver Fila 07/15/2022, 7:35 AM

## 2022-07-16 LAB — BASIC METABOLIC PANEL
Anion gap: 8 (ref 5–15)
BUN: 12 mg/dL (ref 6–20)
CO2: 24 mmol/L (ref 22–32)
Calcium: 8.8 mg/dL — ABNORMAL LOW (ref 8.9–10.3)
Chloride: 104 mmol/L (ref 98–111)
Creatinine, Ser: 0.39 mg/dL — ABNORMAL LOW (ref 0.44–1.00)
GFR, Estimated: >60 mL/min (ref >60)
Glucose, Bld: 139 mg/dL — ABNORMAL HIGH (ref 70–99)
Potassium: 3.5 mmol/L (ref 3.5–5.1)
Sodium: 136 mmol/L (ref 135–145)

## 2022-07-16 LAB — CBC
HCT: 35.8 % — ABNORMAL LOW (ref 36.0–46.0)
Hemoglobin: 11.2 g/dL — ABNORMAL LOW (ref 12.0–15.0)
MCH: 26 pg (ref 26.0–34.0)
MCHC: 31.3 g/dL (ref 30.0–36.0)
MCV: 83.1 fL (ref 80.0–100.0)
Platelets: 273 10*3/uL (ref 150–400)
RBC: 4.31 MIL/uL (ref 3.87–5.11)
RDW: 13.2 % (ref 11.5–15.5)
WBC: 10.7 10*3/uL — ABNORMAL HIGH (ref 4.0–10.5)
nRBC: 0 % (ref 0.0–0.2)

## 2022-07-16 LAB — GLUCOSE, CAPILLARY
Glucose-Capillary: 134 mg/dL — ABNORMAL HIGH (ref 70–99)
Glucose-Capillary: 136 mg/dL — ABNORMAL HIGH (ref 70–99)
Glucose-Capillary: 128 mg/dL — ABNORMAL HIGH (ref 70–99)
Glucose-Capillary: 118 mg/dL — ABNORMAL HIGH (ref 70–99)
Glucose-Capillary: 160 mg/dL — ABNORMAL HIGH (ref 70–99)

## 2022-07-16 LAB — MAGNESIUM: Magnesium: 1.8 mg/dL (ref 1.7–2.4)

## 2022-07-16 NOTE — Progress Notes (Signed)
Mobility Specialist - Progress Note   07/16/22 1018  Mobility  Activity Ambulated independently in hallway  Level of Assistance Independent after set-up  Assistive Device None  Distance Ambulated (ft) 350 ft  Activity Response Tolerated well  Mobility Referral Yes  $Mobility charge 1 Mobility  Mobility Specialist Start Time (ACUTE ONLY) [id number]  Mobility Specialist Stop Time (ACUTE ONLY) 0944  Mobility Specialist Time Calculation (min) (ACUTE ONLY) 7 min   Pt received in bed and agreeable to mobilize. C/o pain in abdominal region and requested 1 standing rest break to help pain settle. Returned to room at EOS and was left in bed with call bell at side.   Arliss Journey Mobility Specialist Acute Rehabilitation Services Phone: [phone] 07/16/22, 10:20 AM

## 2022-07-16 NOTE — Progress Notes (Signed)
4 Days Post-Op Procedure(s) (LRB): ILEOSTOMY TAKEDOWN (N/A)  Subjective: Small bowel movement overnight. No gas since BM. Cramping last night, improved overnight and this morning. Tolerated full meal last night. Didn't drink fluids overnight. Denies nausea or emesis. Ambulating without difficulty.   Objective: Vital signs in last 24 hours: Temp:  [97.9 F (36.6 C)-98.4 F (36.9 C)] 98.3 F (36.8 C) (06/02 0616) Pulse Rate:  [78-93] 90 (06/02 0616) Resp:  [14-17] 17 (06/02 0616) BP: (88-117)/(59-80) 117/76 (06/02 0616) SpO2:  [95 %-97 %] 95 % (06/02 0616) Last BM Date : 07/12/22  Intake/Output from previous day: 06/01 0701 - 06/02 0700 In: 1080 [P.O.:1080] Out: 1000 [Urine:1000]  Physical Examination: Gen: No acute distress, alert and oriented HEENT: Atraumatic, normocephalic, anicteric sclera Cardiovascular: Regular rate and rhythm, no murmurs or rubs appreciated Pulmonary: Lungs are clear to auscultation bilaterally, no wheezes or rhonchi Abdomen: Soft, less distended, hypoactive but improved bowel sounds, incision without surrounding erythema or induration, bandage in place Extremities: Warm and well-perfused, no edema or calf tenderness to palpation  Labs:    Latest Ref Rng & Units 07/16/2022    4:22 AM 07/15/2022    5:30 AM 07/14/2022    5:03 AM  CBC  WBC 4.0 - 10.5 K/uL 10.7  10.4  11.5   Hemoglobin 12.0 - 15.0 g/dL 35.5  73.2  20.2   Hematocrit 36.0 - 46.0 % 35.8  36.1  36.3   Platelets 150 - 400 K/uL 273  294  278       Latest Ref Rng & Units 07/16/2022    4:22 AM 07/15/2022    5:30 AM 07/14/2022    5:03 AM  BMP  Glucose 70 - 99 mg/dL 542  706  237   BUN 6 - 20 mg/dL 12  14  12    Creatinine 0.44 - 1.00 mg/dL 6.28  3.15  1.76   Sodium 135 - 145 mmol/L 136  134  137   Potassium 3.5 - 5.1 mmol/L 3.5  3.1  3.5   Chloride 98 - 111 mmol/L 104  103  103   CO2 22 - 32 mmol/L 24  28  25    Calcium 8.9 - 10.3 mg/dL 8.8  8.7  9.3    Assessment:  42 y.o. s/p  Procedure(s): ILEOSTOMY TAKEDOWN: progressing well.   Postop: Pain well-controlled, meeting most postoperative milestones.  She is at significant risk of postoperative ileus. Bowel sounds improving, small bowel movement overnight.   Mild leukocytosis: suspect reactive. No signs of infection. Continue to monitor.   Heme: Mild acute anemia secondary to surgical blood loss.  Asymptomatic, will continue to monitor.   Endocrine: Type 2 diabetes with last hemoglobin A1c 6.6%.  Glucose monitoring ordered, continue sliding scale.   Prophylaxis: SCDs, ambulation.   Plan: The patient is to be discharged to home. Discharge possibly later today versus tomorrow if continued bowel function.    LOS: 4 days    Carver Fila 07/16/2022, 7:08 AM

## 2022-07-16 NOTE — Progress Notes (Signed)
Reviewed steps to dressing change with pt. All questions answered. Additional packing materials at bedside when pt is ready for d/c.

## 2022-07-17 LAB — COMPREHENSIVE METABOLIC PANEL
ALT: 39 U/L (ref 0–44)
AST: 24 U/L (ref 15–41)
Albumin: 3.3 g/dL — ABNORMAL LOW (ref 3.5–5.0)
Alkaline Phosphatase: 138 U/L — ABNORMAL HIGH (ref 38–126)
Anion gap: 9 (ref 5–15)
BUN: 12 mg/dL (ref 6–20)
CO2: 26 mmol/L (ref 22–32)
Calcium: 9.1 mg/dL (ref 8.9–10.3)
Chloride: 101 mmol/L (ref 98–111)
Creatinine, Ser: 0.54 mg/dL (ref 0.44–1.00)
GFR, Estimated: >60 mL/min (ref >60)
Glucose, Bld: 179 mg/dL — ABNORMAL HIGH (ref 70–99)
Potassium: 3.7 mmol/L (ref 3.5–5.1)
Sodium: 136 mmol/L (ref 135–145)
Total Bilirubin: 0.7 mg/dL (ref 0.3–1.2)
Total Protein: 7.1 g/dL (ref 6.5–8.1)

## 2022-07-17 LAB — CBC
HCT: 34.9 % — ABNORMAL LOW (ref 36.0–46.0)
Hemoglobin: 11 g/dL — ABNORMAL LOW (ref 12.0–15.0)
MCH: 26.2 pg (ref 26.0–34.0)
MCHC: 31.5 g/dL (ref 30.0–36.0)
MCV: 83.1 fL (ref 80.0–100.0)
Platelets: 294 10*3/uL (ref 150–400)
RBC: 4.2 MIL/uL (ref 3.87–5.11)
RDW: 13.4 % (ref 11.5–15.5)
WBC: 8.6 10*3/uL (ref 4.0–10.5)
nRBC: 0 % (ref 0.0–0.2)

## 2022-07-17 LAB — GLUCOSE, CAPILLARY
Glucose-Capillary: 120 mg/dL — ABNORMAL HIGH (ref 70–99)
Glucose-Capillary: 127 mg/dL — ABNORMAL HIGH (ref 70–99)
Glucose-Capillary: 74 mg/dL (ref 70–99)
Glucose-Capillary: 95 mg/dL (ref 70–99)

## 2022-07-17 LAB — MAGNESIUM: Magnesium: 1.7 mg/dL (ref 1.7–2.4)

## 2022-07-17 NOTE — Progress Notes (Signed)
Reviewed written discharge instructions with patient. All questions answered. Patient verbalized understanding. Discharged via wheelchair with all belongings... in stable condition.

## 2022-07-17 NOTE — Progress Notes (Signed)
Mobility Specialist - Progress Note   07/17/22 0930  Mobility  Activity Ambulated independently in hallway  Level of Assistance Independent  Assistive Device None  Distance Ambulated (ft) 500 ft  Activity Response Tolerated well  Mobility Referral Yes  $Mobility charge 1 Mobility  Mobility Specialist Start Time (ACUTE ONLY) [id number]  Mobility Specialist Stop Time (ACUTE ONLY) 0929  Mobility Specialist Time Calculation (min) (ACUTE ONLY) 4 min   Pt received in bed and agreeable to mobility. C/o discomfort near incision when ambulating. Pt to bed after session with all needs met.    [facility]

## 2022-07-17 NOTE — Discharge Summary (Signed)
Physician Discharge Summary  Patient ID: Marie Hill MRN: [id number] DOB/AGE: Apr 18, 1980 42 y.o.  Admit date: 07/12/2022 Discharge date: 07/17/2022  Admission Diagnoses: Ileostomy present [facility])  Discharge Diagnoses:  Principal Problem:   Ileostomy present [facility]) Active Problems:   Ileostomy in place [facility])   Discharged Condition:  The patient is in good condition and stable for discharge.    Hospital Course: On 07/12/2022, the patient underwent the following: Procedure(s): ILEOSTOMY TAKEDOWN. The postoperative course was uneventful.  She was discharged to home on postoperative day 5 tolerating a regular diet, having bowel movements, ambulating, voiding, pain minimal, instructed on dressing changes.   Consults: None  Significant Diagnostic Studies: Labs  Treatments: surgery: see above, IV hydration  Discharge Exam: Blood pressure 113/70, pulse 89, temperature 97.8 F (36.6 C), temperature source Oral, resp. rate 18, height 5\' 2"  (1.575 m), weight 119 lb 0.8 oz (54 kg), SpO2 100 %.  Gen: No acute distress, alert and oriented HEENT: Atraumatic, normocephalic, anicteric sclera Cardiovascular: Regular rate and rhythm, mildly tachycardic in low 90s, no murmurs or rubs appreciated Pulmonary: Lungs are clear to auscultation bilaterally, no wheezes or rhonchi Abdomen: Soft, less distended, active bowel sounds, incision without surrounding erythema or induration, bandage in place Extremities: Warm and well-perfused, no edema or calf tenderness to palpation     Discharge Instructions     Call MD for:  difficulty breathing, headache or visual disturbances   Complete by: As directed    Call MD for:  extreme fatigue   Complete by: As directed    Call MD for:  hives   Complete by: As directed    Call MD for:  persistant dizziness or light-headedness   Complete by: As directed    Call MD for:  persistant nausea and vomiting   Complete by: As directed    Call MD for:  redness,  tenderness, or signs of infection (pain, swelling, redness, odor or green/yellow discharge around incision site)   Complete by: As directed    Call MD for:  severe uncontrolled pain   Complete by: As directed    Call MD for:  temperature >100.4   Complete by: As directed    Diet - low sodium heart healthy   Complete by: As directed    Discharge wound care:   Complete by: As directed    Change the abdominal dressing over previous ostomy site once daily   Driving Restrictions   Complete by: As directed    No driving for 1 week(s).  Do not take narcotics and drive. You need to make sure your reaction time has returned.   Increase activity slowly   Complete by: As directed    Lifting restrictions   Complete by: As directed    No lifting greater than 10 lbs, pushing, pulling, straining for 6 weeks.      Allergies as of 07/17/2022   No Known Allergies      Medication List     STOP taking these medications    loperamide 2 MG capsule Commonly known as: IMODIUM   rosuvastatin 40 MG tablet Commonly known as: CRESTOR       TAKE these medications    amLODipine 10 MG tablet Commonly known as: NORVASC Take 1 tablet (10 mg total) by mouth daily.   benzonatate 100 MG capsule Commonly known as: TESSALON Take 1 capsule (100 mg total) by mouth 3 (three) times daily as needed for cough.   clobetasol 0.05 % external solution Commonly known as: TEMOVATE  Apply 1 Application topically once a week.   ibuprofen 800 MG tablet Commonly known as: ADVIL Take 1 tablet (800 mg total) by mouth every 8 (eight) hours as needed for moderate pain. For AFTER surgery only   Jardiance 25 MG Tabs tablet Generic drug: empagliflozin Take 25 mg by mouth once a week.   magnesium oxide 400 (240 Mg) MG tablet Commonly known as: MAG-OX Take 1 tablet (400 mg total) by mouth daily.   metFORMIN 500 MG tablet Commonly known as: GLUCOPHAGE Take 2 tablets (1,000 mg total) by mouth 2 (two) times daily  with a meal.   potassium chloride 10 MEQ tablet Commonly known as: KLOR-CON M Take 1 tablet (10 mEq total) by mouth 2 (two) times daily.   simethicone 80 MG chewable tablet Commonly known as: MYLICON Chew 1 tablet (80 mg total) by mouth every 6 (six) hours as needed for flatulence (gas pains).   traMADol 50 MG tablet Commonly known as: ULTRAM Take 1 tablet (50 mg total) by mouth every 6 (six) hours as needed for moderate pain or severe pain. For AFTER surgery, do not take and drive               Discharge Care Instructions  (From admission, onward)           Start     Ordered   07/17/22 0000  Discharge wound care:       Comments: Change the abdominal dressing over previous ostomy site once daily   07/17/22 1343            Follow-up Information     Carver Fila, MD Follow up on 08/11/2022.   Specialty: Gynecologic Oncology Why: at 12:45pm at the [facility]. Contact information: [address] [place] [address] [phone]                 Greater than thirty minutes were spend for face to face discharge instructions and discharge orders/summary in EPIC.   Signed: Doylene Bode 07/17/2022, 1:46 PM

## 2022-07-17 NOTE — Progress Notes (Signed)
GYN Oncology Progress Note  Spoke with patient via phone to check in. She reports doing well. Tolerating diet and pushing fluids. Feels ready to go home. Passing flatus and loose stools have slowed. No concerns voiced.

## 2022-07-17 NOTE — Plan of Care (Signed)
  Problem: Education: Goal: Knowledge of General Education information will improve Description: Including pain rating scale, medication(s)/side effects and non-pharmacologic comfort measures Outcome: Adequate for Discharge   Problem: Health Behavior/Discharge Planning: Goal: Ability to manage health-related needs will improve Outcome: Adequate for Discharge   Problem: Clinical Measurements: Goal: Ability to maintain clinical measurements within normal limits will improve Outcome: Adequate for Discharge Goal: Will remain free from infection Outcome: Adequate for Discharge Goal: Diagnostic test results will improve Outcome: Adequate for Discharge Goal: Respiratory complications will improve Outcome: Adequate for Discharge Goal: Cardiovascular complication will be avoided Outcome: Adequate for Discharge   Problem: Activity: Goal: Risk for activity intolerance will decrease Outcome: Adequate for Discharge   Problem: Nutrition: Goal: Adequate nutrition will be maintained Outcome: Adequate for Discharge   Problem: Coping: Goal: Level of anxiety will decrease Outcome: Adequate for Discharge   Problem: Elimination: Goal: Will not experience complications related to bowel motility Outcome: Adequate for Discharge Goal: Will not experience complications related to urinary retention Outcome: Adequate for Discharge   Problem: Pain Managment: Goal: General experience of comfort will improve Outcome: Adequate for Discharge   Problem: Safety: Goal: Ability to remain free from injury will improve Outcome: Adequate for Discharge   Problem: Skin Integrity: Goal: Risk for impaired skin integrity will decrease Outcome: Adequate for Discharge   Problem: Education: Goal: Ability to describe self-care measures that may prevent or decrease complications (Diabetes Survival Skills Education) will improve Outcome: Adequate for Discharge Goal: Individualized Educational Video(s) Outcome:  Adequate for Discharge   Problem: Coping: Goal: Ability to adjust to condition or change in health will improve Outcome: Adequate for Discharge   Problem: Fluid Volume: Goal: Ability to maintain a balanced intake and output will improve Outcome: Adequate for Discharge   Problem: Health Behavior/Discharge Planning: Goal: Ability to identify and utilize available resources and services will improve Outcome: Adequate for Discharge Goal: Ability to manage health-related needs will improve Outcome: Adequate for Discharge   Problem: Metabolic: Goal: Ability to maintain appropriate glucose levels will improve Outcome: Adequate for Discharge   Problem: Nutritional: Goal: Maintenance of adequate nutrition will improve Outcome: Adequate for Discharge Goal: Progress toward achieving an optimal weight will improve Outcome: Adequate for Discharge   Problem: Skin Integrity: Goal: Risk for impaired skin integrity will decrease Outcome: Adequate for Discharge   Problem: Tissue Perfusion: Goal: Adequacy of tissue perfusion will improve Outcome: Adequate for Discharge   Problem: Education: Goal: Knowledge of the prescribed therapeutic regimen will improve Outcome: Adequate for Discharge Goal: Understanding of sexual limitations or changes related to disease process or condition will improve Outcome: Adequate for Discharge Goal: Individualized Educational Video(s) Outcome: Adequate for Discharge   Problem: Self-Concept: Goal: Communication of feelings regarding changes in body function or appearance will improve Outcome: Adequate for Discharge   Problem: Skin Integrity: Goal: Demonstration of wound healing without infection will improve Outcome: Adequate for Discharge   

## 2022-07-17 NOTE — Progress Notes (Signed)
Mobility Specialist - Progress Note   07/17/22 1255  Mobility  Activity Ambulated independently in hallway  Level of Assistance Independent  Assistive Device None  Distance Ambulated (ft) 600 ft  Activity Response Tolerated well  Mobility Referral Yes  $Mobility charge 1 Mobility  Mobility Specialist Start Time (ACUTE ONLY) 1247  Mobility Specialist Stop Time (ACUTE ONLY) 1252  Mobility Specialist Time Calculation (min) (ACUTE ONLY) 5 min   Pt received in bed and agreeable to mobility. No complaints during session. Pt to bed after session with all needs met.    [facility]

## 2022-07-17 NOTE — Progress Notes (Signed)
5 Days Post-Op Procedure(s) (LRB): ILEOSTOMY TAKEDOWN (N/A)  Subjective: Patient reports doing well this morning.  She states she had multiple loose bowel movements yesterday.  She had 3 bowel movements overnight.  She reports dizziness when moving fast.  She continues to tolerate her diet with no nausea or emesis.  Ambulating.  Voiding without difficulty. No concerns voiced.  Objective: Vital signs in last 24 hours: Temp:  [97.9 F (36.6 C)-99.5 F (37.5 C)] 97.9 F (36.6 C) (06/03 0526) Pulse Rate:  [81-102] 81 (06/03 0526) Resp:  [17-18] 17 (06/03 0526) BP: (100-119)/(65-75) 100/65 (06/03 0526) SpO2:  [99 %-100 %] 99 % (06/03 0526) Last BM Date : 07/15/22  Intake/Output from previous day: 06/02 0701 - 06/03 0700 In: 840 [P.O.:840] Out: 1300 [Urine:1300]  Physical Examination: Gen: No acute distress, alert and oriented HEENT: Atraumatic, normocephalic, anicteric sclera Cardiovascular: Regular rate and rhythm, mildly tachycardic in low 90s, no murmurs or rubs appreciated Pulmonary: Lungs are clear to auscultation bilaterally, no wheezes or rhonchi Abdomen: Soft, less distended, active bowel sounds, incision without surrounding erythema or induration, bandage in place Extremities: Warm and well-perfused, no edema or calf tenderness to palpation  Labs:    Latest Ref Rng & Units 07/16/2022    4:22 AM 07/15/2022    5:30 AM 07/14/2022    5:03 AM  CBC  WBC 4.0 - 10.5 K/uL 10.7  10.4  11.5   Hemoglobin 12.0 - 15.0 g/dL 16.1  09.6  04.5   Hematocrit 36.0 - 46.0 % 35.8  36.1  36.3   Platelets 150 - 400 K/uL 273  294  278       Latest Ref Rng & Units 07/16/2022    4:22 AM 07/15/2022    5:30 AM 07/14/2022    5:03 AM  BMP  Glucose 70 - 99 mg/dL 409  811  914   BUN 6 - 20 mg/dL 12  14  12    Creatinine 0.44 - 1.00 mg/dL 7.82  9.56  2.13   Sodium 135 - 145 mmol/L 136  134  137   Potassium 3.5 - 5.1 mmol/L 3.5  3.1  3.5   Chloride 98 - 111 mmol/L 104  103  103   CO2 22 - 32 mmol/L 24   28  25    Calcium 8.9 - 10.3 mg/dL 8.8  8.7  9.3    Assessment: 42 y.o. s/p Procedure(s): ILEOSTOMY TAKEDOWN: progressing well.   Postop: Pain well-controlled, meeting most postoperative milestones. Multiple loose stools yesterday and overnight.   Mild leukocytosis: suspect reactive. No signs of infection. Continue to monitor.   Heme: Mild acute anemia secondary to surgical blood loss.  Asymptomatic, will continue to monitor.   Endocrine: Type 2 diabetes with last hemoglobin A1c 6.6%.  Glucose monitoring ordered, continue sliding scale.   Prophylaxis: SCDs, ambulation.   Plan: Labs ordered Push fluids If doing well later today, plan for discharge home   LOS: 5 days    Doylene Bode 07/17/2022, 8:07 AM

## 2022-07-18 ENCOUNTER — Telehealth: Payer: Self-pay | Admitting: *Deleted

## 2022-07-18 NOTE — Telephone Encounter (Signed)
Spoke with Marie Hill this morning. She states she is eating, drinking and urinating well. She has had several BM's and continues to pass gas. She is taking senokot as prescribed and encouraged her to drink plenty of water. She denies fever or chills. She rates her pain 3/10. She is not taking any pain medicine. Patient states she hasn't changed her dressing yet today.   Instructed to call office with any fever, chills, purulent drainage, uncontrolled pain or any other questions or concerns. Patient verbalizes understanding.   Pt aware of post op appointments as well as the office number [phone] and after hours number [phone] to call if she has any questions or concerns

## 2022-07-27 ENCOUNTER — Telehealth: Payer: Self-pay | Admitting: *Deleted

## 2022-07-27 NOTE — Telephone Encounter (Signed)
Patient's PCP office called regarding receiving the [place] results. Per Warner Mccreedy, APP PCP office to manage the fatty liver issue and patient stopped lipitor

## 2022-08-01 ENCOUNTER — Other Ambulatory Visit: Payer: Self-pay | Admitting: Family Medicine

## 2022-08-01 ENCOUNTER — Encounter: Payer: Self-pay | Admitting: Family Medicine

## 2022-08-01 DIAGNOSIS — Z1231 Encounter for screening mammogram for malignant neoplasm of breast: Secondary | ICD-10-CM

## 2022-08-11 ENCOUNTER — Inpatient Hospital Stay: Payer: PRIVATE HEALTH INSURANCE | Attending: Gynecologic Oncology | Admitting: Gynecologic Oncology

## 2022-08-11 ENCOUNTER — Other Ambulatory Visit: Payer: Self-pay

## 2022-08-11 ENCOUNTER — Encounter: Payer: Self-pay | Admitting: Gynecologic Oncology

## 2022-08-11 VITALS — BP 117/75 | HR 80 | Temp 98.4°F | Ht 62.21 in | Wt 126.0 lb

## 2022-08-11 DIAGNOSIS — Z9889 Other specified postprocedural states: Secondary | ICD-10-CM

## 2022-08-11 DIAGNOSIS — E8940 Asymptomatic postprocedural ovarian failure: Secondary | ICD-10-CM

## 2022-08-11 DIAGNOSIS — N809 Endometriosis, unspecified: Secondary | ICD-10-CM

## 2022-08-11 DIAGNOSIS — Z8742 Personal history of other diseases of the female genital tract: Secondary | ICD-10-CM

## 2022-08-11 MED ORDER — ESTRADIOL 0.1 MG/24HR TD PTWK
0.1000 mg | MEDICATED_PATCH | TRANSDERMAL | 12 refills | Status: DC
Start: 2022-08-11 — End: 2022-09-13

## 2022-08-11 NOTE — Progress Notes (Signed)
Gynecologic Oncology Return Clinic Visit  08/11/22  Reason for Visit: follow-up  Treatment History: The patient was initially referred to Dr. 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 A pelvic mass was identified on pelvic examination and an ultrasound obtained showing:   Uterus measures 6.8 x 5.5 x 4.8 cm endometrial stripe 10.9 mm (last menstrual period 08/18/2014). The right ovary will there was a corpus luteum cyst was measured 14 x 16 x 14 mm with noted color flow in the periphery. Left ovary not seen but a large adnexal mass measuring 12.1 x 9.4 x 8.4 cm defect using internal low level echoes homogeneous echoes along with a solid calcification in the mass which measured 3.6 x 1.3 x 2.0 cm with arterial blood flow seen within the wall of the mass. There was no fluid in the cul-de-sac.   Follow-up CT scan was obtained showing: 1. 11.8 cm multiloculated cystic mass in the right adnexal space, concerning for neoplasm. While there is some mild smooth wall thickening along the medial margin of the lesion, there is no evidence for mural nodule or septal irregularity/thickening by CT and this lesion may be benign. 2. No evidence for ascites. 3. No lymphadenopathy in the pelvis.   CA-125 was 164 units per mL. Roma score was 2.83 (normal for a premenopausal woman is less than 1.31)   09/2014: Robotic-assisted left salpingo-oophorectomy with lysis of adhesions x 2 hours and left ureterolysis for retroperitoneal fibrosis obliterating the pararectal space. Findings at the time of surgery included retroperitoneal left ovarian mass measuring 12cm, replacing left ovary, densely adherent to sigmoid colon, left ovarian fossa and uterus and infiltrating into the retroperitoneum on the left causing adherence to the left ureter which required extensive ureterolysis to mobilize off the mass. The rectum was adherent to the posterior uterine lower segment and cervix and the right ovary was grossly normal with  a corpus luteum. The right fallopian tube was slightly clubbed. Left ovarian cyst consistent with endometrioma with chocolate colored fluid.  Pathology confirmed endometrioma/endometriosis.   After this surgery, the patient underwent HSG which documented patency of her right fallopian tube.  She then conceived spontaneously in April 2017.  This resulted in a 6-week miscarriage.  She underwent stimulation cycles with IUI in 2019 but did not conceive.  She then spontaneously conceived in October 2019, again resulting in a early pregnancy loss.  She required Vasoprost all for retained products of conception.   Subsequently, she had 3 IVF cycles, none of which produced viable and genetically normal embryos.  The last of these cycles was in 2022.   Patient was seen by Dr. April Manson at the end of August 2023 to discuss possibility of fourth IVF cycle.  Labs at that initial visit included a CA125 of 31.2.  At some point subsequently, she underwent cyst aspiration in preparation for upcoming IVF cycle.  She had been taking Orilissa for the last month before this procedure happened.  She underwent transvaginal ultrasound guided ovarian endometrioma aspiration with 24 mm endometrioma aspirated.  She had doxycycline prophylaxis for this procedure and notes indicate that she was prescribed 5 days of doxycycline after the procedure.   The patient was seen back in clinic in early November.  She had abdominal pain at this time after stopping her Dewayne Hatch 1 week previously after running out of medication.  Pelvic ultrasound at that time showed a right endometrioma measuring 4 x 3.1 x 3.7 cm and a resolving left anechoic cystic structure measuring up to 2.3  cm.  No free fluid noted in the pelvis.  Started on Aygestin and Solara for 2 weeks to help suppress estrogen.     Plan had been for her to be on medications for 2 weeks and then to follow-up in clinic.  Given pain and fevers, she presented to the emergency department  about a week after this visit.  In the emergency department, the patient was noted to have leukocytosis of 25.  She was febrile and tachycardic on admission.  Cultures were drawn.  CT scan and ultrasound were performed with results noted below.   12/26/21: CT A/P 1. Complex, multi-septated cystic right adnexal mass measuring up to 9.2 cm in diameter, new compared with previous CT from 2016. Appearance is nonspecific and could reflect an atypical endometrioma or tubo-ovarian abscess. Correlate with recent surgical procedure/findings and presumed outside imaging. Neoplasm not excluded by this examination. Ultrasound may be helpful for further characterization. 2. Mild pelvic soft tissue edema without focal fluid collection, ascites or free air. The appendix is not clearly demonstrated. 3. Mildly prominent retroperitoneal lymph nodes, likely reactive. 4. No acute findings in the chest.   12/26/21: Pelvic ultrasound  There is marked enlargement of right ovary. There are multiple smooth marginated lesions of varying sizes largest measuring 6.6 cm in maximum diameter. Findings suggestive multiple hemorrhagic right ovarian cysts. There is no evidence of right ovarian torsion. Left ovary is surgically absent. There is inhomogeneous echogenicity in myometrium without no discrete focal abnormalities.   Patient was admitted and treated with IV cefepime, flagyl, vancomycin. Urine culture was negative negative, wet prep showed clue cells, blood cultures were also negative. She was discharged home on Augmentin and doxycycline for 10 days.   She was seen for follow-up on November 29.  Pelvic ultrasound performed in clinic showed a right tubo-ovarian complex adherent to the uterus measured 10.6 x 8.8 x 7.8 cm.   01/13/22: Patient was seen in my office and secondary to abdominal pain, tachycardia, and fevers over the last couple of weeks, patient sent to the emergency department for admission.   12/1-12/8/23:  Patient admitted, started on IV Zosyn.  Given outpatient treatment failure in size of mass, interventional radiology consulted.  Drain placed in the largest cystic area of the ovary.  Gram stain showed rare WBC, few gram-positive cocci and rare gram-negative rods.  Culture revealed moderate PREVOTELLA BIVIA.  Given improved symptoms and leukocytosis, patient transitioned from Zosyn to levofloxacin and Flagyl.  Unfortunately, she did not tolerate this and was transitioned to Augmentin.  Given worsening clinical symptoms as well as worsening leukocytosis, she was transition back to Zosyn and infectious diseases was consulted.  Decision made to discharge her on prolonged course of IV Zosyn with interval plan for surgery.   She presented for follow-up on 01/27/22 at which time the patient noted that she had worsened at home in the past day with fevers up to 101 and feeling achy and weak. She also noted some nausea and decreased appetite. Given increased WBC, worsening symptoms despite IV antibiotics, recommendation was made for admission and definitive surgical management. Plan had initially been for admission at [facility], however, her insurance was out of network. As a result she was sent to the emergency room for CT scan, continuation of antibiotics, admission, and ultimately surgical intervention.   01/30/22: Cystoscopy with right ureteral stent placement (Dr. Liliane Shi); exploratory laparotomy with en bloc resection including total hysterectomy, right salpingo-oophorectomy, appendectomy, and rectosigmoid resection, diverting loop ileostomy, and cystoscopy  She had a prolonged course postop was complicated by development of a partial SBO. She required an NG tube for bowel rest and decompression on 2 separate occasions as well as TPN for 6 days.  She was ultimately discharged on postop day 22.  CT abdomen/pelvis on 5/1 showed well-healed rectosigmoid anastomosis.  On 07/12/2022, patient underwent ileostomy  takedown.  Postoperative course was unremarkable and she was discharged home on postoperative day 5 with return of bowel function.  Interval History: Doing well.  Endorses good bowel function, has to stay on the toilet for a little bit longer to have her bowel movement but notes stool is soft and well-formed, having regular movements.  Denies any nausea or emesis.  Denies any abdominal pain currently.  Is not needing pain medication.  Tolerating a regular diet.  Denies any urinary symptoms.  Feels some pinching along her spine and scapula on the right side.  Past Medical/Surgical History: Past Medical History:  Diagnosis Date   Dandruff    Diabetes mellitus without complication (HCC)    Endometriosis 2016   Stage IV/LSO   Glucose intolerance (impaired glucose tolerance)    Hyperlipidemia    Hypertension    Peripheral vascular disease (HCC)    Positive PPD, treated    states follow up chest x rays negative   PPD positive 1997,6/ 2016   no meds 1997, treated in June 2016 x 1 month- states neg chest x ray June 2016    Past Surgical History:  Procedure Laterality Date   CYSTOSCOPY W/ URETERAL STENT PLACEMENT Right 01/30/2022   Procedure: CYSTOSCOPY WITH RETROGRADE PYELOGRAM/URETERAL STENT PLACEMENT;  Surgeon: Rene Paci, MD;  Location: [facility];  Service: Urology;  Laterality: Right;   ILEOSTOMY CLOSURE N/A 07/12/2022   Procedure: ILEOSTOMY TAKEDOWN;  Surgeon: Carver Fila, MD;  Location: [facility];  Service: General;  Laterality: N/A;   LAPAROSCOPIC UNILATERAL SALPINGO OOPHERECTOMY N/A 09/29/2014   Procedure: LAPAROSCOPIC LEFT SALPINGO OOPHORECTOMY ADHESIOLYSIS ;  Surgeon: Adolphus Birchwood, MD;  Location: [facility];  Service: Gynecology;  Laterality: N/A;   ROBOTIC ASSISTED LAPAROSCOPIC OVARIAN CYSTECTOMY N/A 09/29/2014   Procedure: SI ROBOTIC ASSISTED LAPAROSCOPIC OVARIAN CYSTECTOMY;  Surgeon: Adolphus Birchwood, MD;  Location: [facility];  Service: Gynecology;  Laterality: N/A;    SALPINGOOPHORECTOMY N/A 01/30/2022   Procedure: OPEN unilateral SALPINGO OOPHORECTOMY,  hysterectomy  rectosigmoid resection and ileostomy;  Surgeon: Carver Fila, MD;  Location: [facility];  Service: Gynecology;  Laterality: N/A;   TYMPANOPLASTY Left 08/19/2019   Procedure: LEFT TYMPANOPLASTY;  Surgeon: Drema Halon, MD;  Location: [facility];  Service: ENT;  Laterality: Left;    Family History  Problem Relation Age of Onset   Hyperlipidemia Mother    Hypertension Mother    Hyperlipidemia Father     Social History   Socioeconomic History   Marital status: Married    Spouse name: Not on file   Number of children: 0   Years of education: Not on file   Highest education level: Not on file  Occupational History   Occupation: Merchandiser, retail  Tobacco Use   Smoking status: Never   Smokeless tobacco: Never  Vaping Use   Vaping Use: Never used  Substance and Sexual Activity   Alcohol use: Never   Drug use: No   Sexual activity: Not Currently    Partners: Male    Birth control/protection: None  Other Topics Concern   Not on file  Social History Narrative   Marital status:  Single; together x 8 years.      Children: none      Lives: with boyfriend; from [place]; moved to [place] at age 39.      Employment:  Dentist.      Tobacco: none      Alcohol: weekends      Drugs: none      Exercise:  Sporadic      Seatbelt: 100%; no texting   Social Determinants of Health   Financial Resource Strain: Not on file  Food Insecurity: No Food Insecurity (07/12/2022)   Hunger Vital Sign    Worried About Running Out of Food in the Last Year: Never true    Ran Out of Food in the Last Year: Never true  Transportation Needs: No Transportation Needs (07/12/2022)   PRAPARE - Administrator, Civil Service (Medical): No    Lack of Transportation (Non-Medical): No  Physical Activity: Not on file  Stress: Not on file  Social  Connections: Not on file    Current Medications:  Current Outpatient Medications:    amLODipine (NORVASC) 10 MG tablet, Take 1 tablet (10 mg total) by mouth daily., Disp: 30 tablet, Rfl: 3   atorvastatin (LIPITOR) 40 MG tablet, Take 40 mg by mouth daily., Disp: , Rfl:    clobetasol (TEMOVATE) 0.05 % external solution, Apply 1 Application topically once a week., Disp: , Rfl:    estradiol (CLIMARA) 0.1 mg/24hr patch, Place 1 patch (0.1 mg total) onto the skin once a week., Disp: 4 patch, Rfl: 12   JARDIANCE 25 MG TABS tablet, Take 25 mg by mouth once a week., Disp: , Rfl:    magnesium oxide (MAG-OX) 400 (240 Mg) MG tablet, Take 1 tablet (400 mg total) by mouth daily., Disp: 30 tablet, Rfl: 3   metFORMIN (GLUCOPHAGE) 500 MG tablet, Take 2 tablets (1,000 mg total) by mouth 2 (two) times daily with a meal., Disp: 60 tablet, Rfl: 3  Review of Systems: Denies appetite changes, fevers, chills, fatigue, unexplained weight changes. Denies hearing loss, neck lumps or masses, mouth sores, ringing in ears or voice changes. Denies cough or wheezing.  Denies shortness of breath. Denies chest pain or palpitations. Denies leg swelling. Denies abdominal distention, pain, blood in stools, constipation, diarrhea, nausea, vomiting, or early satiety. Denies pain with intercourse, dysuria, frequency, hematuria or incontinence. Denies hot flashes, pelvic pain, vaginal bleeding or vaginal discharge.   Denies joint pain, back pain or muscle pain/cramps. Denies itching, rash, or wounds. Denies dizziness, headaches, numbness or seizures. Denies swollen lymph nodes or glands, denies easy bruising or bleeding. Denies anxiety, depression, confusion, or decreased concentration.  Physical Exam: BP 117/75 (Patient Position: Sitting)   Pulse 80   Temp 98.4 F (36.9 C)   Ht 5' 2.21" (1.58 m)   Wt 126 lb (57.2 kg)   SpO2 100%   BMI 22.89 kg/m  General: Alert, oriented, no acute distress. HEENT: Posterior  oropharynx clear, sclera anicteric. Chest: Unlabored breathing on room air. Abdomen: soft, nontender.  Normoactive bowel sounds.  No masses or hepatosplenomegaly appreciated.  Well-healing ostomy site.  Open only approximately 2 cm now and very shallow with good granulation tissue. Extremities: Grossly normal range of motion.  Warm, well perfused.  No edema bilaterally.  Laboratory & Radiologic Studies: A. ILEOSTOMY:  - Segment of small intestine (8 cm) with stoma  - Separate segments of small intestine (1.5 and 7 cm) with no specific  histologic changes   Assessment &  Plan: Kania Horbal is a 42 y.o. woman with  history of endometriosis who developed TOA after outpatient REI procedure (transvaginal endometrioma drainage) that failed outpatient therapy requiring extensive surgery in 01/2022 including rectosigmoid resection with diverting ileostomy now s/p ileostomy reversal.  Patient is overall doing very well.  We discussed continued expectations and restrictions from a postoperative standpoint.  Discussed trying some heat and exercises for her right upper back pain.  If this does not improve, recommended reaching out to her primary care provider for referral to physical therapy.  Patient is following with her primary care provider in light of recent abnormal LFTs.    Given surgical menopause and menopausal symptoms in the setting of young age, discussed starting HRT today.  Prescription sent for Climara patch.  I will plan to have a phone visit with the patient in a month to assure improvement in her symptoms and tolerating well.  16 minutes of total time was spent for this patient encounter, including preparation, face-to-face counseling with the patient and coordination of care, and documentation of the encounter.  Eugene Garnet, MD  Division of Gynecologic Oncology  Department of Obstetrics and Gynecology  [facility]

## 2022-08-11 NOTE — Patient Instructions (Signed)
It was great to see you today.  Please let me know if you need anything in the future!

## 2022-08-16 ENCOUNTER — Ambulatory Visit: Payer: PRIVATE HEALTH INSURANCE

## 2022-08-21 ENCOUNTER — Ambulatory Visit: Payer: PRIVATE HEALTH INSURANCE

## 2022-08-25 ENCOUNTER — Encounter: Payer: Self-pay | Admitting: Gynecologic Oncology

## 2022-09-06 ENCOUNTER — Ambulatory Visit
Admission: RE | Admit: 2022-09-06 | Discharge: 2022-09-06 | Disposition: A | Discharge status: Home / Self Care | Payer: PRIVATE HEALTH INSURANCE | Source: Ambulatory Visit | Attending: Family Medicine | Admitting: Family Medicine

## 2022-09-06 DIAGNOSIS — Z1231 Encounter for screening mammogram for malignant neoplasm of breast: Secondary | ICD-10-CM

## 2022-09-13 ENCOUNTER — Inpatient Hospital Stay: Payer: PRIVATE HEALTH INSURANCE | Attending: Gynecologic Oncology | Admitting: Gynecologic Oncology

## 2022-09-13 ENCOUNTER — Encounter: Payer: Self-pay | Admitting: Gynecologic Oncology

## 2022-09-13 DIAGNOSIS — E8940 Asymptomatic postprocedural ovarian failure: Secondary | ICD-10-CM

## 2022-09-13 DIAGNOSIS — Z8742 Personal history of other diseases of the female genital tract: Secondary | ICD-10-CM

## 2022-09-13 DIAGNOSIS — Z9889 Other specified postprocedural states: Secondary | ICD-10-CM

## 2022-09-13 MED ORDER — ESTRADIOL 0.1 MG/24HR TD PTTW
1.0000 | MEDICATED_PATCH | TRANSDERMAL | 12 refills | Status: DC
Start: 2022-09-14 — End: 2023-10-05

## 2022-09-13 NOTE — Progress Notes (Signed)
Gynecologic Oncology Telehealth Note: Gyn-Onc  I connected with Bernett Raysor on 09/13/22 at  4:00 PM EDT by telephone and verified that I am speaking with the correct person using two identifiers.  I discussed the limitations, risks, security and privacy concerns of performing an evaluation and management service by telemedicine and the availability of in-person appointments. I also discussed with the patient that there may be a patient responsible charge related to this service. The patient expressed understanding and agreed to proceed.  Other persons participating in the visit and their role in the encounter: none.  Patient's location: home Provider's location: [facility]  Reason for Visit: follow-up  Treatment History: The patient was initially referred to Dr. 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 A pelvic mass was identified on pelvic examination and an ultrasound obtained showing:   Uterus measures 6.8 x 5.5 x 4.8 cm endometrial stripe 10.9 mm (last menstrual period 08/18/2014). The right ovary will there was a corpus luteum cyst was measured 14 x 16 x 14 mm with noted color flow in the periphery. Left ovary not seen but a large adnexal mass measuring 12.1 x 9.4 x 8.4 cm defect using internal low level echoes homogeneous echoes along with a solid calcification in the mass which measured 3.6 x 1.3 x 2.0 cm with arterial blood flow seen within the wall of the mass. There was no fluid in the cul-de-sac.   Follow-up CT scan was obtained showing: 1. 11.8 cm multiloculated cystic mass in the right adnexal space, concerning for neoplasm. While there is some mild smooth wall thickening along the medial margin of the lesion, there is no evidence for mural nodule or septal irregularity/thickening by CT and this lesion may be benign. 2. No evidence for ascites. 3. No lymphadenopathy in the pelvis.   CA-125 was 164 units per mL. Roma score was 2.83 (normal for a premenopausal woman is less than  1.31)   09/2014: Robotic-assisted left salpingo-oophorectomy with lysis of adhesions x 2 hours and left ureterolysis for retroperitoneal fibrosis obliterating the pararectal space. Findings at the time of surgery included retroperitoneal left ovarian mass measuring 12cm, replacing left ovary, densely adherent to sigmoid colon, left ovarian fossa and uterus and infiltrating into the retroperitoneum on the left causing adherence to the left ureter which required extensive ureterolysis to mobilize off the mass. The rectum was adherent to the posterior uterine lower segment and cervix and the right ovary was grossly normal with a corpus luteum. The right fallopian tube was slightly clubbed. Left ovarian cyst consistent with endometrioma with chocolate colored fluid.  Pathology confirmed endometrioma/endometriosis.   After this surgery, the patient underwent HSG which documented patency of her right fallopian tube.  She then conceived spontaneously in April 2017.  This resulted in a 6-week miscarriage.  She underwent stimulation cycles with IUI in 2019 but did not conceive.  She then spontaneously conceived in October 2019, again resulting in a early pregnancy loss.  She required Vasoprost all for retained products of conception.   Subsequently, she had 3 IVF cycles, none of which produced viable and genetically normal embryos.  The last of these cycles was in 2022.   Patient was seen by Dr. April Manson at the end of August 2023 to discuss possibility of fourth IVF cycle.  Labs at that initial visit included a CA125 of 31.2.  At some point subsequently, she underwent cyst aspiration in preparation for upcoming IVF cycle.  She had been taking Orilissa for the last month before this  procedure happened.  She underwent transvaginal ultrasound guided ovarian endometrioma aspiration with 24 mm endometrioma aspirated.  She had doxycycline prophylaxis for this procedure and notes indicate that she was prescribed 5 days of  doxycycline after the procedure.   The patient was seen back in clinic in early November.  She had abdominal pain at this time after stopping her Dewayne Hatch 1 week previously after running out of medication.  Pelvic ultrasound at that time showed a right endometrioma measuring 4 x 3.1 x 3.7 cm and a resolving left anechoic cystic structure measuring up to 2.3 cm.  No free fluid noted in the pelvis.  Started on Aygestin and Solara for 2 weeks to help suppress estrogen.     Plan had been for her to be on medications for 2 weeks and then to follow-up in clinic.  Given pain and fevers, she presented to the emergency department about a week after this visit.  In the emergency department, the patient was noted to have leukocytosis of 25.  She was febrile and tachycardic on admission.  Cultures were drawn.  CT scan and ultrasound were performed with results noted below.   12/26/21: CT A/P 1. Complex, multi-septated cystic right adnexal mass measuring up to 9.2 cm in diameter, new compared with previous CT from 2016. Appearance is nonspecific and could reflect an atypical endometrioma or tubo-ovarian abscess. Correlate with recent surgical procedure/findings and presumed outside imaging. Neoplasm not excluded by this examination. Ultrasound may be helpful for further characterization. 2. Mild pelvic soft tissue edema without focal fluid collection, ascites or free air. The appendix is not clearly demonstrated. 3. Mildly prominent retroperitoneal lymph nodes, likely reactive. 4. No acute findings in the chest.   12/26/21: Pelvic ultrasound  There is marked enlargement of right ovary. There are multiple smooth marginated lesions of varying sizes largest measuring 6.6 cm in maximum diameter. Findings suggestive multiple hemorrhagic right ovarian cysts. There is no evidence of right ovarian torsion. Left ovary is surgically absent. There is inhomogeneous echogenicity in myometrium without no discrete focal  abnormalities.   Patient was admitted and treated with IV cefepime, flagyl, vancomycin. Urine culture was negative negative, wet prep showed clue cells, blood cultures were also negative. She was discharged home on Augmentin and doxycycline for 10 days.   She was seen for follow-up on November 29.  Pelvic ultrasound performed in clinic showed a right tubo-ovarian complex adherent to the uterus measured 10.6 x 8.8 x 7.8 cm.   01/13/22: Patient was seen in my office and secondary to abdominal pain, tachycardia, and fevers over the last couple of weeks, patient sent to the emergency department for admission.   12/1-12/8/23: Patient admitted, started on IV Zosyn.  Given outpatient treatment failure in size of mass, interventional radiology consulted.  Drain placed in the largest cystic area of the ovary.  Gram stain showed rare WBC, few gram-positive cocci and rare gram-negative rods.  Culture revealed moderate PREVOTELLA BIVIA.  Given improved symptoms and leukocytosis, patient transitioned from Zosyn to levofloxacin and Flagyl.  Unfortunately, she did not tolerate this and was transitioned to Augmentin.  Given worsening clinical symptoms as well as worsening leukocytosis, she was transition back to Zosyn and infectious diseases was consulted.  Decision made to discharge her on prolonged course of IV Zosyn with interval plan for surgery.   She presented for follow-up on 01/27/22 at which time the patient noted that she had worsened at home in the past day with fevers up to 101 and feeling  achy and weak. She also noted some nausea and decreased appetite. Given increased WBC, worsening symptoms despite IV antibiotics, recommendation was made for admission and definitive surgical management. Plan had initially been for admission at [facility], however, her insurance was out of network. As a result she was sent to the emergency room for CT scan, continuation of antibiotics, admission, and ultimately surgical  intervention.   01/30/22: Cystoscopy with right ureteral stent placement (Dr. Liliane Shi); exploratory laparotomy with en bloc resection including total hysterectomy, right salpingo-oophorectomy, appendectomy, and rectosigmoid resection, diverting loop ileostomy, and cystoscopy    She had a prolonged course postop was complicated by development of a partial SBO. She required an NG tube for bowel rest and decompression on 2 separate occasions as well as TPN for 6 days.  She was ultimately discharged on postop day 22.   CT abdomen/pelvis on 5/1 showed well-healed rectosigmoid anastomosis.   On 07/12/2022, patient underwent ileostomy takedown.  Postoperative course was unremarkable and she was discharged home on postoperative day 5 with return of bowel function.  Interval History: Doing well. Significant decrease in hot flashes. Having some rash with patch - especially after 2-3 days and if doesn't rotate location of patch. Area itches once patch comes off.  Past Medical/Surgical History: Past Medical History:  Diagnosis Date   Dandruff    Diabetes mellitus without complication (HCC)    Endometriosis 2016   Stage IV/LSO   Glucose intolerance (impaired glucose tolerance)    Hyperlipidemia    Hypertension    Peripheral vascular disease (HCC)    Positive PPD, treated    states follow up chest x rays negative   PPD positive 1997,6/ 2016   no meds 1997, treated in June 2016 x 1 month- states neg chest x ray June 2016    Past Surgical History:  Procedure Laterality Date   CYSTOSCOPY W/ URETERAL STENT PLACEMENT Right 01/30/2022   Procedure: CYSTOSCOPY WITH RETROGRADE PYELOGRAM/URETERAL STENT PLACEMENT;  Surgeon: Rene Paci, MD;  Location: [facility];  Service: Urology;  Laterality: Right;   ILEOSTOMY CLOSURE N/A 07/12/2022   Procedure: ILEOSTOMY TAKEDOWN;  Surgeon: Carver Fila, MD;  Location: [facility];  Service: General;  Laterality: N/A;   LAPAROSCOPIC UNILATERAL SALPINGO  OOPHERECTOMY N/A 09/29/2014   Procedure: LAPAROSCOPIC LEFT SALPINGO OOPHORECTOMY ADHESIOLYSIS ;  Surgeon: Adolphus Birchwood, MD;  Location: [facility];  Service: Gynecology;  Laterality: N/A;   ROBOTIC ASSISTED LAPAROSCOPIC OVARIAN CYSTECTOMY N/A 09/29/2014   Procedure: SI ROBOTIC ASSISTED LAPAROSCOPIC OVARIAN CYSTECTOMY;  Surgeon: Adolphus Birchwood, MD;  Location: [facility];  Service: Gynecology;  Laterality: N/A;   SALPINGOOPHORECTOMY N/A 01/30/2022   Procedure: OPEN unilateral SALPINGO OOPHORECTOMY,  hysterectomy  rectosigmoid resection and ileostomy;  Surgeon: Carver Fila, MD;  Location: [facility];  Service: Gynecology;  Laterality: N/A;   TYMPANOPLASTY Left 08/19/2019   Procedure: LEFT TYMPANOPLASTY;  Surgeon: Drema Halon, MD;  Location: [facility];  Service: ENT;  Laterality: Left;    Family History  Problem Relation Age of Onset   Hyperlipidemia Mother    Hypertension Mother    Hyperlipidemia Father     Social History   Socioeconomic History   Marital status: Married    Spouse name: Not on file   Number of children: 0   Years of education: Not on file   Highest education level: Not on file  Occupational History   Occupation: Merchandiser, retail  Tobacco Use   Smoking status: Never   Smokeless tobacco: Never  Vaping Use  Vaping status: Never Used  Substance and Sexual Activity   Alcohol use: Never   Drug use: No   Sexual activity: Not Currently    Partners: Male    Birth control/protection: None  Other Topics Concern   Not on file  Social History Narrative   Marital status:  Single; together x 8 years.      Children: none      Lives: with boyfriend; from [place]; moved to [place] at age 55.      Employment:  Dentist.      Tobacco: none      Alcohol: weekends      Drugs: none      Exercise:  Sporadic      Seatbelt: 100%; no texting   Social Determinants of Health   Financial Resource Strain: Not on file  Food Insecurity:  No Food Insecurity (07/12/2022)   Hunger Vital Sign    Worried About Running Out of Food in the Last Year: Never true    Ran Out of Food in the Last Year: Never true  Transportation Needs: No Transportation Needs (07/12/2022)   PRAPARE - Administrator, Civil Service (Medical): No    Lack of Transportation (Non-Medical): No  Physical Activity: Not on file  Stress: Not on file  Social Connections: Not on file    Current Medications:  Current Outpatient Medications:    amLODipine (NORVASC) 10 MG tablet, Take 1 tablet (10 mg total) by mouth daily., Disp: 30 tablet, Rfl: 3   atorvastatin (LIPITOR) 40 MG tablet, Take 40 mg by mouth daily., Disp: , Rfl:    clobetasol (TEMOVATE) 0.05 % external solution, Apply 1 Application topically once a week., Disp: , Rfl:    JARDIANCE 25 MG TABS tablet, Take 25 mg by mouth once a week., Disp: , Rfl:    magnesium oxide (MAG-OX) 400 (240 Mg) MG tablet, Take 1 tablet (400 mg total) by mouth daily., Disp: 30 tablet, Rfl: 3   metFORMIN (GLUCOPHAGE) 500 MG tablet, Take 2 tablets (1,000 mg total) by mouth 2 (two) times daily with a meal., Disp: 60 tablet, Rfl: 3  Review of Symptoms: Pertinent positives as per HPI.  Physical Exam: Deferred given limitations of phone visit.  Laboratory & Radiologic Studies: None new  Assessment & Plan: Marie Hill is a 42 y.o. woman with a history of endometriosis who developed TOA after outpatient REI procedure (transvaginal endometrioma drainage) that failed outpatient therapy requiring extensive surgery in 01/2022 including rectosigmoid resection with diverting ileostomy now s/p ileostomy reversal.   Doing well from a symptom standpoint on HRT but having a reaction to the patch. Discussed trying different patch - I asked her to call within 3-4 weeks to let me know if still having skin reaction with Vivelle Dot. If so, plan would be to put her on oral estrogen.  I discussed the assessment and treatment plan with  the patient. The patient was provided with an opportunity to ask questions and all were answered. The patient agreed with the plan and demonstrated an understanding of the instructions.   The patient was advised to call back or see an in-person evaluation if the symptoms worsen or if the condition fails to improve as anticipated.   8 minutes of total time was spent for this patient encounter, including preparation, phone counseling with the patient and coordination of care, and documentation of the encounter.   Eugene Garnet, MD  Division of Gynecologic Oncology  Department of  Obstetrics and Gynecology  [facility]

## 2022-09-21 ENCOUNTER — Encounter: Payer: Self-pay | Admitting: Internal Medicine

## 2022-10-04 ENCOUNTER — Telehealth: Payer: Self-pay | Admitting: *Deleted

## 2022-10-04 NOTE — Telephone Encounter (Signed)
Spoke with Marie Hill who states she has been using the new patch Vivelle for one week and hasn't developed a rash. Pt states, the last time with the old patch the rash started two-three weeks after starting. Pt states the area of the old patch is slight raised and red, but it's slowly going away. Pt advised to call the office back in a couple weeks to let us know how the new patch is working out. Pt verbalized understanding and thanked the office for calling.

## 2022-10-10 ENCOUNTER — Encounter: Payer: Self-pay | Admitting: Gynecologic Oncology

## 2022-11-09 ENCOUNTER — Encounter: Payer: Self-pay | Admitting: Gynecologic Oncology

## 2023-06-26 DIAGNOSIS — Z9889 Other specified postprocedural states: Secondary | ICD-10-CM | POA: Insufficient documentation

## 2023-06-26 DIAGNOSIS — H7112 Cholesteatoma of tympanum, left ear: Secondary | ICD-10-CM | POA: Insufficient documentation

## 2023-10-05 ENCOUNTER — Telehealth: Payer: Self-pay

## 2023-10-05 ENCOUNTER — Other Ambulatory Visit: Payer: Self-pay | Admitting: Gynecologic Oncology

## 2023-10-05 DIAGNOSIS — E8940 Asymptomatic postprocedural ovarian failure: Secondary | ICD-10-CM

## 2023-10-05 MED ORDER — ESTRADIOL 1 MG PO TABS: 1.0000 mg | ORAL_TABLET | Freq: Every day | ORAL | 3 refills | Status: AC

## 2023-10-05 NOTE — Telephone Encounter (Signed)
 Refill authorization request received from [facility] pharmacy. Pt is requesting a switch from the transdermal patch to 90 day supply of the tablets.  (Patch does not want to stick to skin)   Per Eleanor Epps NP, transdermal patch has been discontinued and tablets have been sent in. Pt aware if traveling long distances to move around as much as possible to prevent DVT. She voiced an understanding.

## 2023-10-10 ENCOUNTER — Encounter: Payer: Self-pay | Admitting: Dermatology

## 2023-10-10 ENCOUNTER — Ambulatory Visit: Admitting: Dermatology

## 2023-10-10 VITALS — BP 109/68 | HR 101

## 2023-10-10 DIAGNOSIS — R21 Rash and other nonspecific skin eruption: Secondary | ICD-10-CM | POA: Diagnosis not present

## 2023-10-10 DIAGNOSIS — L910 Hypertrophic scar: Secondary | ICD-10-CM

## 2023-10-10 DIAGNOSIS — L404 Guttate psoriasis: Secondary | ICD-10-CM

## 2023-10-10 MED ORDER — CLOBETASOL PROPIONATE 0.05 % EX OINT
1.0000 | TOPICAL_OINTMENT | Freq: Two times a day (BID) | CUTANEOUS | 4 refills | Status: AC
Start: 2023-10-10 — End: ?

## 2023-10-10 MED ORDER — TRIAMCINOLONE ACETONIDE 40 MG/ML IJ SUSP
40.0000 mg | Freq: Once | INTRAMUSCULAR | Status: AC
Start: 2023-10-10 — End: 2023-10-10
  Administered 2023-10-10: 40 mg via INTRAMUSCULAR

## 2023-10-10 NOTE — Progress Notes (Addendum)
 New Patient Visit   Subjective  Marie Hill is a 43 y.o. 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 Patient reports the areas have been there for 2 years after her total hysterectomy. She reports the areas are bothersome. She reports the scars are very tight, painful and itchy. Patient rates irritation 8 out of 10. Patient reports she has not previously been treated for these areas. Patient denies Hx of bx. Patient denies family history of skin cancer(s).  Patient reports she also has dry itchy skin after her surgery. Pateint reports the areas can be very dry and itchy  The following portions of the chart were reviewed this encounter and updated as appropriate: medications, allergies, medical history  Review of Systems:  No other skin or systemic complaints except as noted in HPI or Assessment and Plan.  Objective  Well appearing patient in no apparent distress; mood and affect are within normal limits.  A focused examination was performed of the following areas: Abdomen, Legs and Face  Relevant exam findings are noted in the Assessment and Plan.                            Left Lower Leg - Anterior Erythematous papule with mild scale Right Abdomen (side) - Lower, Right Suprapubic Area Firm scars  Assessment & Plan     1. 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 The scar is described as both flat and elevated in areas, with occasional itching reported. No previous treatments have been attempted for the scar. - Plan:    Inject Kenalog  (triamcinolone ) into the scar to flatten it and reduce tenderness     2. Possible Psoriasis - Assessment: Patient presents with symptoms suggestive of psoriasis affecting the face and scalp. Further  evaluation is needed to confirm the diagnosis.  - Plan:    Prescribe Desonide cream for face, to be used two weeks on, two weeks off    Prescribe clobetasol  for scalp use    Recommend DHS zinc  shampoo for scalp, to be left on for 3 minutes before rinsing    Provide samples of Vichy selenium to mix with zinc  shampoo  Prescribe clobetasol  cream to be applied twice daily for two weeks on, two weeks off    Perform biopsy of the ar to determine next steps if cream is ineffective    Send biopsy results via MyChart     Consider injectable or oral medication if psoriasis is confirmed and topical treatments are ineffective  RASH Left Lower Leg - Anterior Epidermal / dermal shaving - Left Lower Leg - Anterior  Lesion diameter (cm):  0.7 Informed consent: discussed and consent obtained   Timeout: patient name, date of birth, surgical site, and procedure verified   Procedure prep:  Patient was prepped and draped in usual sterile fashion Prep type:  Isopropyl alcohol Anesthesia: the lesion was anesthetized in a standard fashion   Anesthetic:  1% lidocaine  w/ epinephrine  1-100,000 buffered w/ 8.4% NaHCO3 Instrument used: DermaBlade   Hemostasis achieved with: pressure and aluminum chloride   Outcome: patient tolerated procedure well   Post-procedure details: sterile dressing applied and wound care instructions given   Dressing type: bandage and petrolatum     Specimen 1 - Surgical pathology  Differential Diagnosis: R/O Guttate psoriasis vs parapsoriasis vs other  Check Margins: No Related Medications clobetasol  ointment (TEMOVATE ) 0.05 % Apply 1 Application topically 2 (two) times daily. Apply 2 times daily for 2 weeks then STOP and Take a break for 2 weeks. Resume after 2 weeks if rash is still present continue for 2 weeks applied 2 times daily KELOID (2) Right Abdomen (side) - Lower, Right Suprapubic Area Procedure Note Intralesional Injection  Location: Abdomen  Informed Consent:  Discussed risks (infection, pain, bleeding, bruising, thinning of the skin, loss of skin pigment, lack of resolution, and recurrence of lesion) and benefits of the procedure, as well as the alternatives. Informed consent was obtained. Preparation: The area was prepared a standard fashion.  Anesthesia:None  Procedure Details: An intralesional injection was performed with Kenalog  40 mg/cc. 0.3 cc in total were injected. NDC #: [address]-8 Exp: 07/12/2024 LOT: [id number]  Total number of injections: 9  Plan: The patient was instructed on post-op care. Recommend OTC analgesia as needed for pain.  Related Medications triamcinolone  acetonide (KENALOG -40) injection 40 mg   Return in about 3 months (around 01/08/2024) for Rash and Keloid F/U.  I, Jetta Ager, am acting as Neurosurgeon for [employer], DO.  Documentation: I have reviewed the above documentation for accuracy and completeness, and I agree with the above.  Delon Lenis, DO

## 2023-10-10 NOTE — Patient Instructions (Addendum)
 Date: Wed Oct 10 2023  Hello Damia,  Thank you for visiting today. Here is a summary of the key instructions:  - Medications:   - Use clobetasol  cream twice a day for two weeks on, two weeks off on the scar   - Use Desonide cream on the face for two weeks on, two weeks off   - Use clobetasol  on the scalp with DHS zinc  shampoo   - Mix Vichy selenium samples with the zinc  shampoo  - Skin Care:   - Let shampoo sit for 3 minutes before rinsing  - Treatment Areas:   - Kenalog  injection to flatten scar and reduce tenderness   - Biopsy to be performed  - Follow-up:   - Check MyChart for biopsy results  - Other Instructions:   - Bring prescribed creams on your trip to [place], [place]  Please reach out if you have any questions or concerns.  Warm regards,  Dr. [facility]       Patient Handout: Wound Care for Skin Biopsy Site  Taking Care of Your Skin Biopsy Site  Proper care of the biopsy site is essential for promoting healing and minimizing scarring. This handout provides instructions on how to care for your biopsy site to ensure optimal recovery.  1. Cleaning the Wound:  Clean the biopsy site daily with gentle soap and water . Gently pat the area dry with a clean, soft towel. Avoid harsh scrubbing or rubbing the area, as this can irritate the skin and delay healing.  2. Applying Aquaphor and Bandage:  After cleaning the wound, apply a thin layer of Aquaphor ointment to the biopsy site. Cover the area with a sterile bandage to protect it from dirt, bacteria, and friction. Change the bandage daily or as needed if it becomes soiled or wet.  3. Continued Care for One Week:  Repeat the cleaning, Aquaphor application, and bandaging process daily for one week following the biopsy procedure. Keeping the wound clean and moist during this initial healing period will help prevent infection and promote optimal healing.  4. Massaging Aquaphor into the  Area:  ---After one week, discontinue the use of bandages but continue to apply Aquaphor to the biopsy site. ----Gently massage the Aquaphor into the area using circular motions. ---Massaging the skin helps to promote circulation and prevent the formation of scar tissue.   Additional Tips:  Avoid exposing the biopsy site to direct sunlight during the healing process, as this can cause hyperpigmentation or worsen scarring. If you experience any signs of infection, such as increased redness, swelling, warmth, or drainage from the wound, contact your healthcare provider immediately. Follow any additional instructions provided by your healthcare provider for caring for the biopsy site and managing any discomfort. Conclusion:  Taking proper care of your skin biopsy site is crucial for ensuring optimal healing and minimizing scarring. By following these instructions for cleaning, applying Aquaphor, and massaging the area, you can promote a smooth and successful recovery. If you have any questions or concerns about caring for your biopsy site, don't hesitate to contact your healthcare provider for guidance.    Important Information   Due to recent changes in healthcare laws, you may see results of your pathology and/or laboratory studies on MyChart before the doctors have had a chance to review them. We understand that in some cases there may be results that are confusing or concerning to you. Please understand that not all results are received at the same time and  often the doctors may need to interpret multiple results in order to provide you with the best plan of care or course of treatment. Therefore, we ask that you please give us  2 business days to thoroughly review all your results before contacting the office for clarification. Should we see a critical lab result, you will be contacted sooner.     If You Need Anything After Your Visit   If you have any questions or concerns for your doctor,  please call our main line at [phone]. If no one answers, please leave a voicemail as directed and we will return your call as soon as possible. Messages left after 4 pm will be answered the following business day.    You may also send us  a message via MyChart. We typically respond to MyChart messages within 1-2 business days.  For prescription refills, please ask your pharmacy to contact our office. Our fax number is [phone].  If you have an urgent issue when the clinic is closed that cannot wait until the next business day, you can page your doctor at the number below.     Please note that while we do our best to be available for urgent issues outside of office hours, we are not available 24/7.    If you have an urgent issue and are unable to reach us , you may choose to seek medical care at your doctor's office, retail clinic, urgent care center, or emergency room.   If you have a medical emergency, please immediately call 911 or go to the emergency department. In the event of inclement weather, please call our main line at [phone] for an update on the status of any delays or closures.  Dermatology Medication Tips: Please keep the boxes that topical medications come in in order to help keep track of the instructions about where and how to use these. Pharmacies typically print the medication instructions only on the boxes and not directly on the medication tubes.   If your medication is too expensive, please contact our office at [phone] or send us  a message through MyChart.    We are unable to tell what your co-pay for medications will be in advance as this is different depending on your insurance coverage. However, we may be able to find a substitute medication at lower cost or fill out paperwork to get insurance to cover a needed medication.    If a prior authorization is required to get your medication covered by your insurance company, please allow us  1-2 business days  to complete this process.   Drug prices often vary depending on where the prescription is filled and some pharmacies may offer cheaper prices.   The website www.goodrx.com contains coupons for medications through different pharmacies. The prices here do not account for what the cost may be with help from insurance (it may be cheaper with your insurance), but the website can give you the price if you did not use any insurance.  - You can print the associated coupon and take it with your prescription to the pharmacy.  - You may also stop by our office during regular business hours and pick up a GoodRx coupon card.  - If you need your prescription sent electronically to a different pharmacy, notify our office through [facility] or by phone at [phone]

## 2023-10-11 LAB — SURGICAL PATHOLOGY

## 2023-10-16 ENCOUNTER — Ambulatory Visit: Payer: Self-pay | Admitting: Dermatology

## 2023-10-16 NOTE — Progress Notes (Signed)
 Bx confirms a diagnosis of psoriasis.  Pt was started on topical medication regimen for suspected psoriasi at her visit.  She has a follow up 01/08/24, at that visit I will assess response to therapy and next steps of treatment if she's not clear or almost clear.  - Dr Alm

## 2023-11-14 ENCOUNTER — Other Ambulatory Visit: Payer: Self-pay | Admitting: Family Medicine

## 2023-11-14 DIAGNOSIS — Z1231 Encounter for screening mammogram for malignant neoplasm of breast: Secondary | ICD-10-CM

## 2024-01-08 ENCOUNTER — Ambulatory Visit: Admitting: Dermatology

## 2024-01-08 ENCOUNTER — Encounter: Payer: Self-pay | Admitting: Dermatology

## 2024-01-08 VITALS — BP 113/81

## 2024-01-08 DIAGNOSIS — L409 Psoriasis, unspecified: Secondary | ICD-10-CM | POA: Diagnosis not present

## 2024-01-08 DIAGNOSIS — L910 Hypertrophic scar: Secondary | ICD-10-CM

## 2024-01-08 MED ORDER — TRIAMCINOLONE ACETONIDE 40 MG/ML IJ SUSP
40.0000 mg | Freq: Once | INTRAMUSCULAR | Status: AC
Start: 2024-01-08 — End: 2024-01-08
  Administered 2024-01-08: 40 mg via INTRAMUSCULAR

## 2024-01-08 NOTE — Progress Notes (Signed)
   Follow-Up Visit   Subjective  Marie Hill is a 43 y.o. female established patient who presents for FOLLOW UP on the diagnoses listed below:  Patient was last evaluated on 10/10/23.   Keloid: Kenalog -40 injections administered into abdomen. Pt presents for 2nd round.    Possible Psoriasis: Pt stated her Sx has improved. She has been using Desonide on the face 2 weeks on, 2 weeks off. Her last application was a month but no flares since. However, her last application of clobetasol  cream was 2 weeks ago for a minor flare. Pt stated that her skin still feels very dry but admits that she is not currently applying any moisturizer after showering.    Are you nursing, pregnant or trying to conceive? No   The following portions of the chart were reviewed this encounter and updated as appropriate: medications, allergies, medical history  Review of Systems:  No other skin or systemic complaints except as noted in HPI or Assessment and Plan.  Objective  Well appearing patient in no apparent distress; mood and affect are within normal limits.   A focused examination was performed of the following areas: abdomen   Relevant exam findings are noted in the Assessment and Plan.                Right Abdomen (side) - Lower, Right Suprapubic Area Firm scars  Assessment & Plan   PSORIASIS Exam: Well-demarcated erythematous papules/plaques with silvery scale, guttate pink scaly papules. 0% BSA.  stable  patient denies joint pain  Treatment Plan: - Continue applying desonide to the face 2 weeks on, 2 weeks off.  - Continue using clobetasol  cream for the body 2 weeks on, 2 weeks off. - Continue using clobetasol  solution on the scalp PRN for itchy & scale - Encourage pt to moisturize daily after bathing   KELOID Exam shows Firm pink/brown dermal papule(s)/plaque(s).  Treatment Plan: - Kenalog -40 injections administered today  KELOID (2) Right Abdomen (side) - Lower, Right  Suprapubic Area Procedure Note Intralesional Injection  Location: Abdomen  Informed Consent: Discussed risks (infection, pain, bleeding, bruising, thinning of the skin, loss of skin pigment, lack of resolution, and recurrence of lesion) and benefits of the procedure, as well as the alternatives. Informed consent was obtained. Preparation: The area was prepared a standard fashion.  Anesthesia:None  Procedure Details: An intralesional injection was performed with Kenalog  40 mg/cc. 0.4 cc in total were injected. NDC #: [address]-8 Exp: 07/12/2024 LOT: [id number]  Total number of injections: 8  Plan: The patient was instructed on post-op care. Recommend OTC analgesia as needed for pain.  Related Medications triamcinolone  acetonide (KENALOG -40) injection 40 mg   Return for 3-55mo , KELOID, PSORIASIS.   Documentation: I have reviewed the above documentation for accuracy and completeness, and I agree with the above.  I, Shirron Maranda, CMA II, am acting as scribe for:  Delon Lenis, DO

## 2024-01-08 NOTE — Patient Instructions (Signed)

## 2024-05-07 ENCOUNTER — Ambulatory Visit: Admitting: Dermatology
